# Patient Record
Sex: Female | Born: 1970 | Race: Black or African American | Hispanic: No | Marital: Single | Smoking: Never smoker
Health system: Southern US, Community
[De-identification: ages and names within clinical notes are randomized; demographics above are authoritative.]

## PROBLEM LIST (undated history)

## (undated) DIAGNOSIS — Z8669 Personal history of other diseases of the nervous system and sense organs: Secondary | ICD-10-CM

## (undated) DIAGNOSIS — I10 Essential (primary) hypertension: Secondary | ICD-10-CM

## (undated) DIAGNOSIS — E119 Type 2 diabetes mellitus without complications: Secondary | ICD-10-CM

## (undated) DIAGNOSIS — C50919 Malignant neoplasm of unspecified site of unspecified female breast: Secondary | ICD-10-CM

## (undated) DIAGNOSIS — J45909 Unspecified asthma, uncomplicated: Secondary | ICD-10-CM

## (undated) DIAGNOSIS — D649 Anemia, unspecified: Secondary | ICD-10-CM

## (undated) HISTORY — PX: BREAST SURGERY: SHX581

## (undated) HISTORY — DX: Malignant neoplasm of unspecified site of unspecified female breast: C50.919

## (undated) HISTORY — PX: BREAST LUMPECTOMY: SHX2

---

## 1997-11-06 ENCOUNTER — Inpatient Hospital Stay (HOSPITAL_COMMUNITY): Admission: AD | Admit: 1997-11-06 | Discharge: 1997-11-06 | Payer: Self-pay | Admitting: Obstetrics

## 1997-11-08 ENCOUNTER — Inpatient Hospital Stay (HOSPITAL_COMMUNITY): Admission: AD | Admit: 1997-11-08 | Discharge: 1997-11-08 | Payer: Self-pay | Admitting: Obstetrics & Gynecology

## 1997-11-08 ENCOUNTER — Encounter: Admission: RE | Admit: 1997-11-08 | Discharge: 1997-11-08 | Payer: Self-pay | Admitting: Internal Medicine

## 1998-09-14 ENCOUNTER — Emergency Department (HOSPITAL_COMMUNITY): Admission: EM | Admit: 1998-09-14 | Discharge: 1998-09-14 | Payer: Self-pay | Admitting: Emergency Medicine

## 1998-12-13 ENCOUNTER — Emergency Department (HOSPITAL_COMMUNITY): Admission: EM | Admit: 1998-12-13 | Discharge: 1998-12-13 | Payer: Self-pay | Admitting: Emergency Medicine

## 1998-12-13 ENCOUNTER — Encounter: Payer: Self-pay | Admitting: Emergency Medicine

## 1998-12-28 ENCOUNTER — Emergency Department (HOSPITAL_COMMUNITY): Admission: EM | Admit: 1998-12-28 | Discharge: 1998-12-28 | Payer: Self-pay | Admitting: Emergency Medicine

## 1999-01-10 ENCOUNTER — Encounter: Admission: RE | Admit: 1999-01-10 | Discharge: 1999-04-10 | Payer: Self-pay | Admitting: Orthopedic Surgery

## 1999-01-10 ENCOUNTER — Encounter: Admission: RE | Admit: 1999-01-10 | Discharge: 1999-01-10 | Payer: Self-pay | Admitting: Hematology and Oncology

## 1999-02-09 ENCOUNTER — Emergency Department (HOSPITAL_COMMUNITY): Admission: EM | Admit: 1999-02-09 | Discharge: 1999-02-09 | Payer: Self-pay | Admitting: Emergency Medicine

## 1999-05-24 ENCOUNTER — Emergency Department (HOSPITAL_COMMUNITY): Admission: EM | Admit: 1999-05-24 | Discharge: 1999-05-24 | Payer: Self-pay | Admitting: Emergency Medicine

## 1999-05-24 ENCOUNTER — Encounter: Payer: Self-pay | Admitting: Emergency Medicine

## 1999-09-05 ENCOUNTER — Encounter: Admission: RE | Admit: 1999-09-05 | Discharge: 1999-10-23 | Payer: Self-pay | Admitting: Family Medicine

## 1999-09-11 ENCOUNTER — Ambulatory Visit (HOSPITAL_COMMUNITY): Admission: RE | Admit: 1999-09-11 | Discharge: 1999-09-11 | Payer: Self-pay | Admitting: Family Medicine

## 1999-12-04 ENCOUNTER — Emergency Department (HOSPITAL_COMMUNITY): Admission: EM | Admit: 1999-12-04 | Discharge: 1999-12-04 | Payer: Self-pay | Admitting: Emergency Medicine

## 1999-12-04 ENCOUNTER — Encounter: Payer: Self-pay | Admitting: Emergency Medicine

## 2000-02-23 ENCOUNTER — Emergency Department (HOSPITAL_COMMUNITY): Admission: EM | Admit: 2000-02-23 | Discharge: 2000-02-23 | Payer: Self-pay | Admitting: Internal Medicine

## 2001-06-17 ENCOUNTER — Other Ambulatory Visit: Admission: RE | Admit: 2001-06-17 | Discharge: 2001-06-17 | Payer: Self-pay | Admitting: Obstetrics and Gynecology

## 2004-06-04 ENCOUNTER — Emergency Department (HOSPITAL_COMMUNITY): Admission: EM | Admit: 2004-06-04 | Discharge: 2004-06-04 | Payer: Self-pay | Admitting: Emergency Medicine

## 2006-09-29 ENCOUNTER — Emergency Department (HOSPITAL_COMMUNITY): Admission: EM | Admit: 2006-09-29 | Discharge: 2006-09-29 | Payer: Self-pay | Admitting: Emergency Medicine

## 2006-10-01 ENCOUNTER — Emergency Department (HOSPITAL_COMMUNITY): Admission: EM | Admit: 2006-10-01 | Discharge: 2006-10-01 | Payer: Self-pay | Admitting: *Deleted

## 2007-08-15 ENCOUNTER — Emergency Department (HOSPITAL_COMMUNITY): Admission: EM | Admit: 2007-08-15 | Discharge: 2007-08-16 | Payer: Self-pay | Admitting: Emergency Medicine

## 2008-05-24 ENCOUNTER — Inpatient Hospital Stay (HOSPITAL_COMMUNITY): Admission: AD | Admit: 2008-05-24 | Discharge: 2008-05-24 | Payer: Self-pay | Admitting: Obstetrics & Gynecology

## 2008-08-03 ENCOUNTER — Inpatient Hospital Stay (HOSPITAL_COMMUNITY): Admission: AD | Admit: 2008-08-03 | Discharge: 2008-08-03 | Payer: Self-pay | Admitting: Obstetrics

## 2009-01-04 ENCOUNTER — Inpatient Hospital Stay (HOSPITAL_COMMUNITY): Admission: AD | Admit: 2009-01-04 | Discharge: 2009-01-07 | Payer: Self-pay | Admitting: Obstetrics

## 2009-02-07 ENCOUNTER — Encounter: Admission: RE | Admit: 2009-02-07 | Discharge: 2009-02-07 | Payer: Self-pay | Admitting: Family Medicine

## 2009-02-26 ENCOUNTER — Emergency Department (HOSPITAL_COMMUNITY): Admission: EM | Admit: 2009-02-26 | Discharge: 2009-02-26 | Payer: Self-pay | Admitting: Emergency Medicine

## 2009-06-23 ENCOUNTER — Emergency Department (HOSPITAL_COMMUNITY): Admission: EM | Admit: 2009-06-23 | Discharge: 2009-06-24 | Payer: Self-pay | Admitting: Emergency Medicine

## 2009-11-20 ENCOUNTER — Emergency Department (HOSPITAL_COMMUNITY): Admission: EM | Admit: 2009-11-20 | Discharge: 2009-11-20 | Payer: Self-pay | Admitting: Emergency Medicine

## 2009-12-21 ENCOUNTER — Emergency Department (HOSPITAL_COMMUNITY): Admission: EM | Admit: 2009-12-21 | Discharge: 2009-12-21 | Payer: Self-pay | Admitting: Emergency Medicine

## 2010-02-10 ENCOUNTER — Emergency Department (HOSPITAL_COMMUNITY): Admission: EM | Admit: 2010-02-10 | Discharge: 2010-02-10 | Payer: Self-pay | Admitting: Emergency Medicine

## 2010-04-09 ENCOUNTER — Encounter: Payer: Self-pay | Admitting: Obstetrics

## 2010-04-09 ENCOUNTER — Encounter: Payer: Self-pay | Admitting: Chiropractic Medicine

## 2010-05-02 ENCOUNTER — Other Ambulatory Visit (HOSPITAL_COMMUNITY): Payer: Self-pay | Admitting: Obstetrics

## 2010-05-02 DIAGNOSIS — Z1231 Encounter for screening mammogram for malignant neoplasm of breast: Secondary | ICD-10-CM

## 2010-05-02 DIAGNOSIS — N920 Excessive and frequent menstruation with regular cycle: Secondary | ICD-10-CM

## 2010-05-09 ENCOUNTER — Encounter (HOSPITAL_COMMUNITY): Payer: Self-pay

## 2010-05-09 ENCOUNTER — Ambulatory Visit (HOSPITAL_COMMUNITY)
Admission: RE | Admit: 2010-05-09 | Discharge: 2010-05-09 | Disposition: A | Discharge status: Home / Self Care | Payer: BC Managed Care – PPO | Source: Ambulatory Visit | Attending: Obstetrics | Admitting: Obstetrics

## 2010-05-09 DIAGNOSIS — N938 Other specified abnormal uterine and vaginal bleeding: Secondary | ICD-10-CM | POA: Insufficient documentation

## 2010-05-09 DIAGNOSIS — N920 Excessive and frequent menstruation with regular cycle: Secondary | ICD-10-CM

## 2010-05-09 DIAGNOSIS — D259 Leiomyoma of uterus, unspecified: Secondary | ICD-10-CM | POA: Insufficient documentation

## 2010-05-09 DIAGNOSIS — N838 Other noninflammatory disorders of ovary, fallopian tube and broad ligament: Secondary | ICD-10-CM | POA: Insufficient documentation

## 2010-05-09 DIAGNOSIS — Z1231 Encounter for screening mammogram for malignant neoplasm of breast: Secondary | ICD-10-CM | POA: Insufficient documentation

## 2010-05-09 DIAGNOSIS — N949 Unspecified condition associated with female genital organs and menstrual cycle: Secondary | ICD-10-CM | POA: Insufficient documentation

## 2010-05-30 LAB — POCT URINALYSIS DIPSTICK
Ketones, ur: NEGATIVE mg/dL
Nitrite: NEGATIVE
Protein, ur: NEGATIVE mg/dL
Urobilinogen, UA: 0.2 mg/dL (ref 0.0–1.0)
pH: 6.5 (ref 5.0–8.0)

## 2010-05-30 LAB — POCT I-STAT, CHEM 8
BUN: 9 mg/dL (ref 6–23)
Calcium, Ion: 1.19 mmol/L (ref 1.12–1.32)
HCT: 37 % (ref 36.0–46.0)
Hemoglobin: 12.6 g/dL (ref 12.0–15.0)
Sodium: 137 mEq/L (ref 135–145)
TCO2: 30 mmol/L (ref 0–100)

## 2010-05-30 LAB — POCT PREGNANCY, URINE: Preg Test, Ur: NEGATIVE

## 2010-06-01 LAB — COMPREHENSIVE METABOLIC PANEL
CO2: 27 mEq/L (ref 19–32)
Calcium: 9.3 mg/dL (ref 8.4–10.5)
Creatinine, Ser: 0.69 mg/dL (ref 0.4–1.2)
GFR calc non Af Amer: >60 mL/min (ref >60)
Glucose, Bld: 92 mg/dL (ref 70–99)
Total Protein: 7.5 g/dL (ref 6.0–8.3)

## 2010-06-01 LAB — CBC
Hemoglobin: 9.7 g/dL — ABNORMAL LOW (ref 12.0–15.0)
Hemoglobin: 9.5 g/dL — ABNORMAL LOW (ref 12.0–15.0)
MCH: 21.2 pg — ABNORMAL LOW (ref 26.0–34.0)
MCHC: 32.5 g/dL (ref 30.0–36.0)
MCHC: 31.7 g/dL (ref 30.0–36.0)
MCV: 67.6 fL — ABNORMAL LOW (ref 78.0–100.0)
RBC: 4.43 MIL/uL (ref 3.87–5.11)
RDW: 21 % — ABNORMAL HIGH (ref 11.5–15.5)

## 2010-06-01 LAB — BASIC METABOLIC PANEL
BUN: 10 mg/dL (ref 6–23)
CO2: 28 mEq/L (ref 19–32)
Calcium: 10.2 mg/dL (ref 8.4–10.5)
Creatinine, Ser: 0.66 mg/dL (ref 0.4–1.2)
Glucose, Bld: 95 mg/dL (ref 70–99)
Sodium: 138 mEq/L (ref 135–145)

## 2010-06-01 LAB — CLOSTRIDIUM DIFFICILE EIA: C difficile Toxins A+B, EIA: 6

## 2010-06-01 LAB — HEMOCCULT GUIAC POC 1CARD (OFFICE): Fecal Occult Bld: NEGATIVE

## 2010-06-01 LAB — DIFFERENTIAL
Basophils Absolute: 0 10*3/uL (ref 0.0–0.1)
Eosinophils Absolute: 0.1 10*3/uL (ref 0.0–0.7)
Lymphocytes Relative: 31 % (ref 12–46)
Lymphs Abs: 1.3 10*3/uL (ref 0.7–4.0)
Monocytes Absolute: 0.2 10*3/uL (ref 0.1–1.0)
Neutro Abs: 2.3 10*3/uL (ref 1.7–7.7)
Neutrophils Relative %: 57 % (ref 43–77)
Neutrophils Relative %: 65 % (ref 43–77)

## 2010-06-01 LAB — URINE CULTURE: Colony Count: 10000

## 2010-06-01 LAB — URINALYSIS, ROUTINE W REFLEX MICROSCOPIC
Bilirubin Urine: NEGATIVE
Hgb urine dipstick: NEGATIVE
Ketones, ur: NEGATIVE mg/dL
Nitrite: NEGATIVE
Specific Gravity, Urine: 1.016 (ref 1.005–1.030)
Urobilinogen, UA: 0.2 mg/dL (ref 0.0–1.0)

## 2010-06-01 LAB — URINE MICROSCOPIC-ADD ON

## 2010-06-01 LAB — PREGNANCY, URINE: Preg Test, Ur: NEGATIVE

## 2010-06-01 LAB — LIPASE, BLOOD: Lipase: 26 U/L (ref 11–59)

## 2010-06-22 LAB — URIC ACID
Uric Acid, Serum: 6.3 mg/dL (ref 2.4–7.0)
Uric Acid, Serum: 9 mg/dL — ABNORMAL HIGH (ref 2.4–7.0)

## 2010-06-22 LAB — COMPREHENSIVE METABOLIC PANEL
ALT: 12 U/L (ref 0–35)
AST: 18 U/L (ref 0–37)
Albumin: 3 g/dL — ABNORMAL LOW (ref 3.5–5.2)
Alkaline Phosphatase: 71 U/L (ref 39–117)
BUN: 6 mg/dL (ref 6–23)
Calcium: 7.7 mg/dL — ABNORMAL LOW (ref 8.4–10.5)
Chloride: 103 mEq/L (ref 96–112)
Creatinine, Ser: 0.49 mg/dL (ref 0.4–1.2)
Creatinine, Ser: 0.81 mg/dL (ref 0.4–1.2)
GFR calc Af Amer: >60 mL/min (ref >60)
GFR calc non Af Amer: >60 mL/min (ref >60)
Glucose, Bld: 94 mg/dL (ref 70–99)
Potassium: 4.1 mEq/L (ref 3.5–5.1)
Sodium: 133 mEq/L — ABNORMAL LOW (ref 135–145)
Total Bilirubin: 0.4 mg/dL (ref 0.3–1.2)
Total Protein: 5 g/dL — ABNORMAL LOW (ref 6.0–8.3)

## 2010-06-22 LAB — CBC
HCT: 39.7 % (ref 36.0–46.0)
Hemoglobin: 13.2 g/dL (ref 12.0–15.0)
Hemoglobin: 8.6 g/dL — ABNORMAL LOW (ref 12.0–15.0)
MCHC: 33 g/dL (ref 30.0–36.0)
MCV: 84.2 fL (ref 78.0–100.0)
Platelets: 213 10*3/uL (ref 150–400)
RBC: 4.79 MIL/uL (ref 3.87–5.11)
RDW: 17 % — ABNORMAL HIGH (ref 11.5–15.5)
RDW: 17.2 % — ABNORMAL HIGH (ref 11.5–15.5)
WBC: 6.1 10*3/uL (ref 4.0–10.5)

## 2010-06-22 LAB — LACTATE DEHYDROGENASE: LDH: 145 U/L (ref 94–250)

## 2010-06-22 LAB — MAGNESIUM: Magnesium: 6.3 mg/dL (ref 1.5–2.5)

## 2010-06-27 LAB — GC/CHLAMYDIA PROBE AMP, GENITAL
Chlamydia, DNA Probe: NEGATIVE
GC Probe Amp, Genital: NEGATIVE

## 2010-06-29 LAB — GC/CHLAMYDIA PROBE AMP, GENITAL: Chlamydia, DNA Probe: NEGATIVE

## 2010-06-29 LAB — WET PREP, GENITAL
Clue Cells Wet Prep HPF POC: NONE SEEN
Trich, Wet Prep: NONE SEEN
Yeast Wet Prep HPF POC: NONE SEEN

## 2010-10-08 ENCOUNTER — Emergency Department (HOSPITAL_COMMUNITY)
Admission: EM | Admit: 2010-10-08 | Discharge: 2010-10-08 | Disposition: A | Discharge status: Home / Self Care | Payer: BC Managed Care – PPO | Attending: Emergency Medicine | Admitting: Emergency Medicine

## 2010-10-08 DIAGNOSIS — Z79899 Other long term (current) drug therapy: Secondary | ICD-10-CM | POA: Insufficient documentation

## 2010-10-08 DIAGNOSIS — R110 Nausea: Secondary | ICD-10-CM | POA: Insufficient documentation

## 2010-10-08 DIAGNOSIS — I10 Essential (primary) hypertension: Secondary | ICD-10-CM | POA: Insufficient documentation

## 2010-10-08 DIAGNOSIS — N390 Urinary tract infection, site not specified: Secondary | ICD-10-CM | POA: Insufficient documentation

## 2010-10-08 DIAGNOSIS — R002 Palpitations: Secondary | ICD-10-CM | POA: Insufficient documentation

## 2010-10-08 DIAGNOSIS — R42 Dizziness and giddiness: Secondary | ICD-10-CM | POA: Insufficient documentation

## 2010-10-08 LAB — DIFFERENTIAL
Basophils Absolute: 0 10*3/uL (ref 0.0–0.1)
Eosinophils Absolute: 0 10*3/uL (ref 0.0–0.7)
Metamyelocytes Relative: 0 %
Myelocytes: 0 %
Neutro Abs: 3.1 10*3/uL (ref 1.7–7.7)
Neutrophils Relative %: 72 % (ref 43–77)
Promyelocytes Absolute: 0 %
nRBC: 0 /100 WBC

## 2010-10-08 LAB — CBC
HCT: 28.9 % — ABNORMAL LOW (ref 36.0–46.0)
MCV: 65.2 fL — ABNORMAL LOW (ref 78.0–100.0)
Platelets: 378 10*3/uL (ref 150–400)
RBC: 4.43 MIL/uL (ref 3.87–5.11)
RDW: 17.9 % — ABNORMAL HIGH (ref 11.5–15.5)
WBC: 4.3 10*3/uL (ref 4.0–10.5)

## 2010-10-08 LAB — URINALYSIS, ROUTINE W REFLEX MICROSCOPIC
Bilirubin Urine: NEGATIVE
Nitrite: NEGATIVE
Specific Gravity, Urine: 1.021 (ref 1.005–1.030)
Urobilinogen, UA: 0.2 mg/dL (ref 0.0–1.0)
pH: 5.5 (ref 5.0–8.0)

## 2010-10-08 LAB — BASIC METABOLIC PANEL
CO2: 24 mEq/L (ref 19–32)
Chloride: 102 mEq/L (ref 96–112)
Creatinine, Ser: 0.65 mg/dL (ref 0.50–1.10)
GFR calc Af Amer: >60 mL/min (ref >60)
Potassium: 3.5 mEq/L (ref 3.5–5.1)

## 2010-10-08 LAB — URINE MICROSCOPIC-ADD ON

## 2010-10-08 LAB — POCT PREGNANCY, URINE: Preg Test, Ur: NEGATIVE

## 2010-10-10 LAB — URINE CULTURE

## 2011-01-15 ENCOUNTER — Emergency Department (HOSPITAL_COMMUNITY): Payer: BC Managed Care – PPO

## 2011-01-15 ENCOUNTER — Emergency Department (HOSPITAL_COMMUNITY)
Admission: EM | Admit: 2011-01-15 | Discharge: 2011-01-15 | Disposition: A | Discharge status: Home / Self Care | Payer: BC Managed Care – PPO | Attending: Emergency Medicine | Admitting: Emergency Medicine

## 2011-01-15 DIAGNOSIS — R000 Tachycardia, unspecified: Secondary | ICD-10-CM | POA: Insufficient documentation

## 2011-01-15 DIAGNOSIS — M5030 Other cervical disc degeneration, unspecified cervical region: Secondary | ICD-10-CM | POA: Insufficient documentation

## 2011-01-15 DIAGNOSIS — T1490XA Injury, unspecified, initial encounter: Secondary | ICD-10-CM | POA: Insufficient documentation

## 2011-01-15 DIAGNOSIS — Y92410 Unspecified street and highway as the place of occurrence of the external cause: Secondary | ICD-10-CM | POA: Insufficient documentation

## 2011-01-15 DIAGNOSIS — M549 Dorsalgia, unspecified: Secondary | ICD-10-CM | POA: Insufficient documentation

## 2011-02-11 ENCOUNTER — Emergency Department (HOSPITAL_COMMUNITY)
Admission: EM | Admit: 2011-02-11 | Discharge: 2011-02-11 | Disposition: A | Discharge status: Home / Self Care | Payer: BC Managed Care – PPO | Source: Home / Self Care | Attending: Emergency Medicine | Admitting: Emergency Medicine

## 2011-02-11 ENCOUNTER — Encounter (HOSPITAL_COMMUNITY): Payer: Self-pay | Admitting: *Deleted

## 2011-02-11 DIAGNOSIS — I10 Essential (primary) hypertension: Secondary | ICD-10-CM

## 2011-02-11 HISTORY — DX: Essential (primary) hypertension: I10

## 2011-02-11 NOTE — ED Provider Notes (Addendum)
History     CSN: [id number] Arrival date & time: 02/11/2011 10:03 AM   First MD Initiated Contact with Patient 02/11/11 [phone]      Chief Complaint  Patient presents with  . Hypertension    pt monitors bp at home this am 163/108 - took bp med 8am - now bp 154/92 - per pt felt nauseated this am - feeling better     (Consider location/radiation/quality/duration/timing/severity/associated sxs/prior treatment) HPI Comments: I know my BP has been high these days..cause having headaches no numbness", no weakness  Patient is a 40 y.o. female presenting with hypertension. The history is provided by the patient.  Hypertension This is a new problem. The problem occurs daily. The problem has not changed since onset.Associated symptoms include headaches. Pertinent negatives include no chest pain, no abdominal pain and no shortness of breath. The symptoms are aggravated by nothing. The symptoms are relieved by nothing. She has tried nothing for the symptoms.    Past Medical History  Diagnosis Date  . Hypertension   . Iron deficiency     Past Surgical History  Procedure Date  . Cesarean section     History reviewed. No pertinent family history.  History  Substance Use Topics  . Smoking status: Not on file  . Smokeless tobacco: Not on file  . Alcohol Use:     OB History    Grav Para Term Preterm Abortions TAB SAB Ect Mult Living                  Review of Systems  Constitutional: Negative for fever and activity change.  Respiratory: Negative for shortness of breath.   Cardiovascular: Negative for chest pain.  Gastrointestinal: Negative for abdominal pain.  Neurological: Positive for headaches. Negative for speech difficulty, weakness, light-headedness and numbness.    Allergies  Shrimp  Home Medications   Current Outpatient Rx  Name Route Sig Dispense Refill  . FERROUS SULFATE 325 (65 FE) MG PO TABS Oral Take by mouth daily with breakfast.      .  OLMESARTAN-AMLODIPINE-HCTZ 20-5-12.5 MG PO TABS Oral Take by mouth.        BP 154/92  Pulse 81  Temp(Src) 98.7 F (37.1 C) (Oral)  Resp 17  SpO2 100%  LMP 02/07/2011  Physical Exam  Nursing note and vitals reviewed. Constitutional: She is oriented to person, place, and time. She appears well-developed and well-nourished.  Eyes: Pupils are equal, round, and reactive to light.  Neck: Normal range of motion. Neck supple. No JVD present.  Cardiovascular: Normal rate, regular rhythm and normal heart sounds.   No murmur heard. Pulmonary/Chest: Effort normal and breath sounds normal. No respiratory distress.  Neurological: She is alert and oriented to person, place, and time. No cranial nerve deficit. Coordination abnormal.    ED Course  Procedures (including critical care time)  Labs Reviewed - No data to display No results found.   No diagnosis found.    MDM  HTN- asymptomatic on discharge        Jimmie Molly, MD 02/11/11 1044  Jimmie Molly, MD 02/11/11 1045

## 2011-02-27 ENCOUNTER — Emergency Department (HOSPITAL_COMMUNITY)
Admission: EM | Admit: 2011-02-27 | Discharge: 2011-02-27 | Disposition: A | Discharge status: Home / Self Care | Payer: BC Managed Care – PPO | Attending: Emergency Medicine | Admitting: Emergency Medicine

## 2011-02-27 ENCOUNTER — Encounter (HOSPITAL_COMMUNITY): Payer: Self-pay | Admitting: *Deleted

## 2011-02-27 DIAGNOSIS — R509 Fever, unspecified: Secondary | ICD-10-CM | POA: Insufficient documentation

## 2011-02-27 DIAGNOSIS — R059 Cough, unspecified: Secondary | ICD-10-CM | POA: Insufficient documentation

## 2011-02-27 DIAGNOSIS — R05 Cough: Secondary | ICD-10-CM | POA: Insufficient documentation

## 2011-02-27 DIAGNOSIS — J40 Bronchitis, not specified as acute or chronic: Secondary | ICD-10-CM | POA: Insufficient documentation

## 2011-02-27 DIAGNOSIS — I10 Essential (primary) hypertension: Secondary | ICD-10-CM | POA: Insufficient documentation

## 2011-02-27 DIAGNOSIS — IMO0001 Reserved for inherently not codable concepts without codable children: Secondary | ICD-10-CM | POA: Insufficient documentation

## 2011-02-27 MED ORDER — AZITHROMYCIN 250 MG PO TABS: 500.0000 mg | ORAL_TABLET | Freq: Once | ORAL | Status: AC

## 2011-02-27 MED ORDER — HYDROCOD POLST-CHLORPHEN POLST 10-8 MG/5ML PO LQCR: 5.0000 mL | Freq: Two times a day (BID) | ORAL | Status: DC

## 2011-02-27 NOTE — ED Provider Notes (Signed)
History     CSN: [id number] Arrival date & time: 02/27/2011 10:45 PM   First MD Initiated Contact with Patient 02/27/11 2255      Chief Complaint  Patient presents with  . Influenza    (Consider location/radiation/quality/duration/timing/severity/associated sxs/prior treatment) HPI Comments: Patient here with cough with green sputum production, fever, chills, shortness of breath and sore throat - reports works in the [employer] system - states had flu shot this year - no other symptoms.  Patient is a 40 y.o. female presenting with flu symptoms. The history is provided by the patient. No language interpreter was used.  Influenza This is a new problem. The current episode started yesterday. The problem occurs constantly. The problem has been unchanged. Associated symptoms include congestion, coughing, a fever, myalgias and a sore throat. Pertinent negatives include no abdominal pain, anorexia, arthralgias, chest pain, chills, headaches, joint swelling, nausea, neck pain, rash, swollen glands, visual change, vomiting or weakness. The symptoms are aggravated by nothing. She has tried nothing for the symptoms. The treatment provided no relief.    Past Medical History  Diagnosis Date  . Hypertension   . Iron deficiency   . Bronchiectasis     Past Surgical History  Procedure Date  . Cesarean section     History reviewed. No pertinent family history.  History  Substance Use Topics  . Smoking status: Never Smoker   . Smokeless tobacco: Not on file  . Alcohol Use: Yes     occa    OB History    Grav Para Term Preterm Abortions TAB SAB Ect Mult Living                  Review of Systems  Constitutional: Positive for fever. Negative for chills.  HENT: Positive for congestion and sore throat. Negative for neck pain.   Respiratory: Positive for cough.   Cardiovascular: Negative for chest pain.  Gastrointestinal: Negative for nausea, vomiting, abdominal pain and  anorexia.  Musculoskeletal: Positive for myalgias. Negative for joint swelling and arthralgias.  Skin: Negative for rash.  Neurological: Negative for weakness and headaches.  All other systems reviewed and are negative.    Allergies  Shrimp  Home Medications   Current Outpatient Rx  Name Route Sig Dispense Refill  . FERROUS SULFATE 325 (65 FE) MG PO TABS Oral Take by mouth daily with breakfast.      . OLMESARTAN-AMLODIPINE-HCTZ 20-5-12.5 MG PO TABS Oral Take by mouth.        BP 162/97  Pulse 104  Temp(Src) 97.8 F (36.6 C) (Oral)  Resp 20  SpO2 100%  LMP 02/07/2011  Physical Exam  Nursing note and vitals reviewed. Constitutional: She is oriented to person, place, and time. She appears well-developed and well-nourished. No distress.  HENT:  Head: Normocephalic and atraumatic.  Right Ear: External ear normal.  Left Ear: External ear normal.  Mouth/Throat: Oropharynx is clear and moist. No oropharyngeal exudate.  Eyes: Conjunctivae are normal. Pupils are equal, round, and reactive to light.  Neck: Normal range of motion. Neck supple.  Cardiovascular: Normal rate, regular rhythm and normal heart sounds.   Pulmonary/Chest: Effort normal and breath sounds normal. She has no wheezes. She has no rales. She exhibits no tenderness.  Abdominal: Soft. Bowel sounds are normal. There is no tenderness.  Musculoskeletal: Normal range of motion.  Lymphadenopathy:    She has no cervical adenopathy.  Neurological: She is alert and oriented to person, place, and time.  Skin: Skin is warm  and dry.  Psychiatric: She has a normal mood and affect. Her behavior is normal. Judgment and thought content normal.    ED Course  Procedures (including critical care time)  Labs Reviewed - No data to display No results found.   Bronchitis    MDM  Patient here with bronchitic type symptoms more so that influenza like illness.  Afebrile here - she is noted to be hypertensive - she states that  she doesn't take her blood pressure medication like she should.        Izola Price [place], [place] 02/27/11 2324

## 2011-02-27 NOTE — ED Notes (Signed)
Pt states she started to fell fatigue, chills, and body aches on sat. Pt works at [employer]. 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

## 2011-02-28 NOTE — ED Provider Notes (Signed)
Medical screening examination/treatment/procedure(s) were performed by non-physician practitioner and as supervising physician I was immediately available for consultation/collaboration.  Noell Lorensen, MD 02/28/11 1558 

## 2011-03-22 ENCOUNTER — Inpatient Hospital Stay (HOSPITAL_COMMUNITY): Payer: BC Managed Care – PPO

## 2011-03-22 ENCOUNTER — Encounter (HOSPITAL_COMMUNITY): Payer: Self-pay | Admitting: *Deleted

## 2011-03-22 ENCOUNTER — Inpatient Hospital Stay (HOSPITAL_COMMUNITY)
Admission: AD | Admit: 2011-03-22 | Discharge: 2011-03-22 | Disposition: A | Discharge status: Home / Self Care | Payer: BC Managed Care – PPO | Source: Ambulatory Visit | Attending: Obstetrics and Gynecology | Admitting: Obstetrics and Gynecology

## 2011-03-22 DIAGNOSIS — R1031 Right lower quadrant pain: Secondary | ICD-10-CM | POA: Insufficient documentation

## 2011-03-22 DIAGNOSIS — A5901 Trichomonal vulvovaginitis: Secondary | ICD-10-CM | POA: Insufficient documentation

## 2011-03-22 DIAGNOSIS — D259 Leiomyoma of uterus, unspecified: Secondary | ICD-10-CM | POA: Insufficient documentation

## 2011-03-22 DIAGNOSIS — N83209 Unspecified ovarian cyst, unspecified side: Secondary | ICD-10-CM | POA: Insufficient documentation

## 2011-03-22 DIAGNOSIS — A599 Trichomoniasis, unspecified: Secondary | ICD-10-CM

## 2011-03-22 LAB — WET PREP, GENITAL: Clue Cells Wet Prep HPF POC: NONE SEEN

## 2011-03-22 LAB — CBC
HCT: 31.7 % — ABNORMAL LOW (ref 36.0–46.0)
MCV: 72.2 fL — ABNORMAL LOW (ref 78.0–100.0)
RBC: 4.39 MIL/uL (ref 3.87–5.11)
WBC: 6.1 10*3/uL (ref 4.0–10.5)

## 2011-03-22 LAB — URINALYSIS, ROUTINE W REFLEX MICROSCOPIC
Ketones, ur: 15 mg/dL — AB
Nitrite: NEGATIVE
Protein, ur: NEGATIVE mg/dL
pH: 6.5 (ref 5.0–8.0)

## 2011-03-22 MED ORDER — METRONIDAZOLE 500 MG PO TABS
2000.0000 mg | ORAL_TABLET | Freq: Once | ORAL | Status: AC
  Administered 2011-03-22: 2000 mg via ORAL
  Filled 2011-03-22: qty 4

## 2011-03-22 MED ORDER — KETOROLAC TROMETHAMINE 60 MG/2ML IM SOLN
60.0000 mg | Freq: Once | INTRAMUSCULAR | Status: AC
  Administered 2011-03-22: 60 mg via INTRAMUSCULAR
  Filled 2011-03-22: qty 2

## 2011-03-22 NOTE — ED Notes (Signed)
abd soft, tender on palp in RLQ.

## 2011-03-22 NOTE — ED Notes (Signed)
Pt did have alcohol last night, was given crackers and fluids

## 2011-03-22 NOTE — ED Provider Notes (Signed)
History     Chief Complaint  Patient presents with  . Abdominal Pain   HPI  Pt is not pregnant and presents with RLQ since 12/30.  Pt denies vaginal discharge, itching, burning .  She denies fever, chills, constipation or diarrhea.  Pt has a history of fibroids.  Pt has not taken any pain medication.  Past Medical History  Diagnosis Date  . Hypertension   . Iron deficiency   . Bronchiectasis   . Urinary tract infection   . Fibroid   . Chlamydia     Past Surgical History  Procedure Date  . Cesarean section     Family History  Problem Relation Age of Onset  . Anesthesia problems Neg Hx     History  Substance Use Topics  . Smoking status: Former Games developer  . Smokeless tobacco: Never Used  . Alcohol Use: Yes     occa    Allergies:  Allergies  Allergen Reactions  . Shrimp (Shellfish Allergy)     Prescriptions prior to admission  Medication Sig Dispense Refill  . chlorpheniramine-HYDROcodone (TUSSIONEX PENNKINETIC ER) 10-8 MG/5ML LQCR Take 5 mLs by mouth every 12 (twelve) hours.  140 mL  0  . ferrous sulfate 325 (65 FE) MG tablet Take by mouth daily with breakfast.        . Olmesartan-Amlodipine-HCTZ 20-5-12.5 MG TABS Take by mouth.          ROS Physical Exam   Blood pressure 139/94, pulse 94, temperature 99 F (37.2 C), temperature source Oral, resp. rate 16, height 5\' 3"  (1.6 m), weight 204 lb (92.534 kg), last menstrual period 03/08/2011, SpO2 99.00%.  Physical Exam  Vitals reviewed. Constitutional: She is oriented to person, place, and time. She appears well-developed and well-nourished.  HENT:  Head: Normocephalic.  Eyes: Pupils are equal, round, and reactive to light.  Neck: Normal range of motion. Neck supple.  Cardiovascular: Normal rate.   Respiratory: Effort normal.  GI: Soft. She exhibits no distension and no mass. There is tenderness. There is no rebound and no guarding.  Genitourinary:       Mod amount of creamy yellow discharge; cervix clean  nontender; uterus retroverted making full assessment difficult; right tender adnexal difficult to assess whether ovary or right uterine fibroid; left adnexal without palpable enlargement or tenderness  Musculoskeletal: Normal range of motion.  Neurological: She is alert and oriented to person, place, and time.  Skin: Skin is warm and dry.  Psychiatric: She has a normal mood and affect.    MAU Course  Procedures Results for orders placed during the hospital encounter of 03/22/11 (from the past 24 hour(s))  URINALYSIS, ROUTINE W REFLEX MICROSCOPIC     Status: Abnormal   Collection Time   03/22/11  1:29 PM      Component Value Range   Color, Urine YELLOW  YELLOW    APPearance HAZY (*) CLEAR    Specific Gravity, Urine 1.020  1.005 - 1.030    pH 6.5  5.0 - 8.0    Glucose, UA NEGATIVE  NEGATIVE (mg/dL)   Hgb urine dipstick NEGATIVE  NEGATIVE    Bilirubin Urine NEGATIVE  NEGATIVE    Ketones, ur 15 (*) NEGATIVE (mg/dL)   Protein, ur NEGATIVE  NEGATIVE (mg/dL)   Urobilinogen, UA 0.2  0.0 - 1.0 (mg/dL)   Nitrite NEGATIVE  NEGATIVE    Leukocytes, UA MODERATE (*) NEGATIVE   URINE MICROSCOPIC-ADD ON     Status: Abnormal   Collection Time  03/22/11  1:29 PM      Component Value Range   Squamous Epithelial / LPF FEW (*) RARE    WBC, UA 11-20  <3 (WBC/hpf)   RBC / HPF 7-10  <3 (RBC/hpf)   Bacteria, UA MANY (*) RARE    Urine-Other TRICHOMONAS PRESENT    POCT PREGNANCY, URINE     Status: Normal   Collection Time   03/22/11  1:33 PM      Component Value Range   Preg Test, Ur NEGATIVE    CBC     Status: Abnormal   Collection Time   03/22/11  1:59 PM      Component Value Range   WBC 6.1  4.0 - 10.5 (K/uL)   RBC 4.39  3.87 - 5.11 (MIL/uL)   Hemoglobin 9.7 (*) 12.0 - 15.0 (g/dL)   HCT 46.9 (*) 62.9 - 46.0 (%)   MCV 72.2 (*) 78.0 - 100.0 (fL)   MCH 22.1 (*) 26.0 - 34.0 (pg)   MCHC 30.6  30.0 - 36.0 (g/dL)   RDW 52.8 (*) 41.3 - 15.5 (%)   Platelets 368  150 - 400 (K/uL)   fibroids. LMP  03/08/2011  TRANSABDOMINAL AND TRANSVAGINAL ULTRASOUND OF PELVIS  Technique: Both transabdominal and transvaginal ultrasound  examinations of the pelvis were performed. Transabdominal technique  was performed for global imaging of the pelvis including uterus,  ovaries, adnexal regions, and pelvic cul-de-sac.  Comparison: 05/09/2010  It was necessary to proceed with endovaginal exam following the  transabdominal exam to visualize the myometrium endometrium and  adnexa.  Findings:  Uterus: Is enlarged with a sagittal length of 9.6 cm, depth of 7.8  cm and width of 7.9 cm. Multiple fibroids are identified and have  sizes and locations as follows: With a subserosal component in the  right fundal region measuring 3.3 x 3.6 x 3.0 cm, in the right  lateral mid body measuring 1.6 x 2.0 x 1.3 cm, in the left  posterolateral lower uterine segment measuring 3.6 x 3.0 by 3.8 cm  and in the left lateral mid body measuring 2.7 x 2.3 x 2.6 cm with  a questionable small submucosal component. A small fibroid is seen  emanating off of the left lateral lower uterine segment with a  subserosal component measuring 1.2 x 2.1 x 1.23 cm. Multiple  Nabothian cysts are identified within the cervix.  Endometrium: Has a late tri- layered appearance which would  correlate with a periovulatory endometrial stripe and correspond  with the patient's given LMP of 03/08/2011. Depth in the sagittal  plane of 17 mm is likely spuriously increased due to deviation from  the left lateral partial submucosal fibroid. No focal abnormality.  Right ovary: Measures 4.8 x 3.0 by 2.5 cm a hypoechoic area with  peripheral flow measuring 2.7 x 3.1 cm and likely representing a  hemorrhagic corpus luteum.  Left ovary: Measures 2.7 by 3.4 x 2.1 cm and has a normal  appearance  Other findings: A small amount of complex fluid is identified in  the cul-de-sac and adjacent to the right ovary.  IMPRESSION:  Fibroid uterus with fibroid  sizes and locations as noted above.  Several of the fibroids have a partial subserosal component and one  has a partial submucosal component.  Accurate endometrial measurement is compromised by the presence of  the submucosal fibroid. No definite endometrial abnormality is  seen.  Probable right hemorrhagic corpus luteum with possible leakage as  evidenced by small amount of associated complex  pelvic fluid.  Original Report Authenticated By: Bertha Stakes, M.D.   Urinalysis showed Trichomonas  Wet prep GC/chlamydia-pending                       Assessment and Plan  firboid uterus Right hemorrhagic ovarian cyst Pt obtained relief of pain with Toradol 60 mg IM Trichomonas-treated in MAU with Flagyl 2 gm Partner to be treated F/u with Dr. Ellin Goodie 03/22/2011, 3:44 PM

## 2011-03-22 NOTE — Progress Notes (Signed)
Patient states she had been having lower abdominal pain that radiates to the right flank since 12-30. Denies any bleeding or vaginal discharge. States has had a little nausea but no vomiting.

## 2011-03-22 NOTE — Progress Notes (Signed)
Hx of fibroids, don't know if pain is from that or pulled something at work.

## 2011-03-23 LAB — GC/CHLAMYDIA PROBE AMP, GENITAL
Chlamydia, DNA Probe: NEGATIVE
GC Probe Amp, Genital: NEGATIVE

## 2011-05-21 ENCOUNTER — Other Ambulatory Visit (HOSPITAL_COMMUNITY): Payer: Self-pay | Admitting: Obstetrics

## 2011-05-21 DIAGNOSIS — Z1231 Encounter for screening mammogram for malignant neoplasm of breast: Secondary | ICD-10-CM

## 2011-06-15 ENCOUNTER — Ambulatory Visit (HOSPITAL_COMMUNITY): Payer: BC Managed Care – PPO | Attending: Obstetrics

## 2011-06-29 ENCOUNTER — Ambulatory Visit (HOSPITAL_COMMUNITY)
Admission: RE | Admit: 2011-06-29 | Discharge: 2011-06-29 | Disposition: A | Discharge status: Home / Self Care | Payer: BC Managed Care – PPO | Source: Ambulatory Visit | Attending: Obstetrics | Admitting: Obstetrics

## 2011-06-29 DIAGNOSIS — Z1231 Encounter for screening mammogram for malignant neoplasm of breast: Secondary | ICD-10-CM

## 2011-09-09 ENCOUNTER — Emergency Department (HOSPITAL_COMMUNITY)
Admission: EM | Admit: 2011-09-09 | Discharge: 2011-09-09 | Disposition: A | Discharge status: Home / Self Care | Payer: BC Managed Care – PPO | Attending: Emergency Medicine | Admitting: Emergency Medicine

## 2011-09-09 ENCOUNTER — Encounter (HOSPITAL_COMMUNITY): Payer: Self-pay

## 2011-09-09 DIAGNOSIS — I10 Essential (primary) hypertension: Secondary | ICD-10-CM | POA: Insufficient documentation

## 2011-09-09 DIAGNOSIS — Z87891 Personal history of nicotine dependence: Secondary | ICD-10-CM | POA: Insufficient documentation

## 2011-09-09 DIAGNOSIS — D509 Iron deficiency anemia, unspecified: Secondary | ICD-10-CM | POA: Insufficient documentation

## 2011-09-09 MED ORDER — OLMESARTAN-AMLODIPINE-HCTZ 20-5-12.5 MG PO TABS: 1.0000 | ORAL_TABLET | ORAL | Status: DC

## 2011-09-09 NOTE — Discharge Instructions (Signed)
Arterial Hypertension Arterial hypertension (high blood pressure) is a condition of elevated pressure in your blood vessels. Hypertension over a long period of time is a risk factor for strokes, heart attacks, and heart failure. It is also the leading cause of kidney (renal) failure.  CAUSES   In Adults -- Over 90% of all hypertension has no known cause. This is called essential or primary hypertension. In the other 10% of people with hypertension, the increase in blood pressure is caused by another disorder. This is called secondary hypertension. Important causes of secondary hypertension are:   Heavy alcohol use.   Obstructive sleep apnea.   Hyperaldosterosim (Conn's syndrome).   Steroid use.   Chronic kidney failure.   Hyperparathyroidism.   Medications.   Renal artery stenosis.   Pheochromocytoma.   Cushing's disease.   Coarctation of the aorta.   Scleroderma renal crisis.   Licorice (in excessive amounts).   Drugs (cocaine, methamphetamine).  Your caregiver can explain any items above that apply to you.  In Children -- Secondary hypertension is more common and should always be considered.   Pregnancy -- Few women of childbearing age have high blood pressure. However, up to 10% of them develop hypertension of pregnancy. Generally, this will not harm the woman. It may be a sign of 3 complications of pregnancy: preeclampsia, HELLP syndrome, and eclampsia. Follow up and control with medication is necessary.  SYMPTOMS   This condition normally does not produce any noticeable symptoms. It is usually found during a routine exam.   Malignant hypertension is a late problem of high blood pressure. It may have the following symptoms:   Headaches.   Blurred vision.   End-organ damage (this means your kidneys, heart, lungs, and other organs are being damaged).   Stressful situations can increase the blood pressure. If a person with normal blood pressure has their blood  pressure go up while being seen by their caregiver, this is often termed "white coat hypertension." Its importance is not known. It may be related with eventually developing hypertension or complications of hypertension.   Hypertension is often confused with mental tension, stress, and anxiety.  DIAGNOSIS  The diagnosis is made by 3 separate blood pressure measurements. They are taken at least 1 week apart from each other. If there is organ damage from hypertension, the diagnosis may be made without repeat measurements. Hypertension is usually identified by having blood pressure readings:  Above 140/90 mmHg measured in both arms, at 3 separate times, over a couple weeks.   Over 130/80 mmHg should be considered a risk factor and may require treatment in patients with diabetes.  Blood pressure readings over 120/80 mmHg are called "pre-hypertension" even in non-diabetic patients. To get a true blood pressure measurement, use the following guidelines. Be aware of the factors that can alter blood pressure readings.  Take measurements at least 1 hour after caffeine.   Take measurements 30 minutes after smoking and without any stress. This is another reason to quit smoking - it raises your blood pressure.   Use a proper cuff size. Ask your caregiver if you are not sure about your cuff size.   Most home blood pressure cuffs are automatic. They will measure systolic and diastolic pressures. The systolic pressure is the pressure reading at the start of sounds. Diastolic pressure is the pressure at which the sounds disappear. If you are elderly, measure pressures in multiple postures. Try sitting, lying or standing.   Sit at rest for a minimum of   5 minutes before taking measurements.   You should not be on any medications like decongestants. These are found in many cold medications.   Record your blood pressure readings and review them with your caregiver.  If you have hypertension:  Your caregiver  may do tests to be sure you do not have secondary hypertension (see "causes" above).   Your caregiver may also look for signs of metabolic syndrome. This is also called Syndrome X or Insulin Resistance Syndrome. You may have this syndrome if you have type 2 diabetes, abdominal obesity, and abnormal blood lipids in addition to hypertension.   Your caregiver will take your medical and family history and perform a physical exam.   Diagnostic tests may include blood tests (for glucose, cholesterol, potassium, and kidney function), a urinalysis, or an EKG. Other tests may also be necessary depending on your condition.  PREVENTION  There are important lifestyle issues that you can adopt to reduce your chance of developing hypertension:  Maintain a normal weight.   Limit the amount of salt (sodium) in your diet.   Exercise often.   Limit alcohol intake.   Get enough potassium in your diet. Discuss specific advice with your caregiver.   Follow a DASH diet (dietary approaches to stop hypertension). This diet is rich in fruits, vegetables, and low-fat dairy products, and avoids certain fats.  PROGNOSIS  Essential hypertension cannot be cured. Lifestyle changes and medical treatment can lower blood pressure and reduce complications. The prognosis of secondary hypertension depends on the underlying cause. Many people whose hypertension is controlled with medicine or lifestyle changes can live a normal, healthy life.  RISKS AND COMPLICATIONS  While high blood pressure alone is not an illness, it often requires treatment due to its short- and long-term effects on many organs. Hypertension increases your risk for:  CVAs or strokes (cerebrovascular accident).   Heart failure due to chronically high blood pressure (hypertensive cardiomyopathy).   Heart attack (myocardial infarction).   Damage to the retina (hypertensive retinopathy).   Kidney failure (hypertensive nephropathy).  Your caregiver can  explain list items above that apply to you. Treatment of hypertension can significantly reduce the risk of complications. TREATMENT   For overweight patients, weight loss and regular exercise are recommended. Physical fitness lowers blood pressure.   Mild hypertension is usually treated with diet and exercise. A diet rich in fruits and vegetables, fat-free dairy products, and foods low in fat and salt (sodium) can help lower blood pressure. Decreasing salt intake decreases blood pressure in a 1/3 of people.   Stop smoking if you are a smoker.  The steps above are highly effective in reducing blood pressure. While these actions are easy to suggest, they are difficult to achieve. Most patients with moderate or severe hypertension end up requiring medications to bring their blood pressure down to a normal level. There are several classes of medications for treatment. Blood pressure pills (antihypertensives) will lower blood pressure by their different actions. Lowering the blood pressure by 10 mmHg may decrease the risk of complications by as much as 25%. The goal of treatment is effective blood pressure control. This will reduce your risk for complications. Your caregiver will help you determine the best treatment for you according to your lifestyle. What is excellent treatment for one person, may not be for you. HOME CARE INSTRUCTIONS   Do not smoke.   Follow the lifestyle changes outlined in the "Prevention" section.   If you are on medications, follow the directions   carefully. Blood pressure medications must be taken as prescribed. Skipping doses reduces their benefit. It also puts you at risk for problems.   Follow up with your caregiver, as directed.   If you are asked to monitor your blood pressure at home, follow the guidelines in the "Diagnosis" section above.  SEEK MEDICAL CARE IF:   You think you are having medication side effects.   You have recurrent headaches or lightheadedness.     You have swelling in your ankles.   You have trouble with your vision.  SEEK IMMEDIATE MEDICAL CARE IF:   You have sudden onset of chest pain or pressure, difficulty breathing, or other symptoms of a heart attack.   You have a severe headache.   You have symptoms of a stroke (such as sudden weakness, difficulty speaking, difficulty walking).  MAKE SURE YOU:   Understand these instructions.   Will watch your condition.   Will get help right away if you are not doing well or get worse.  Document Released: 03/05/2005 Document Revised: 02/22/2011 Document Reviewed: 10/03/2006 [employer] Patient Information 2012 [employer]. 

## 2011-09-09 NOTE — ED Provider Notes (Signed)
History     CSN: [id number]  Arrival date & time 09/09/11  1024   First MD Initiated Contact with Patient 09/09/11 1034      Chief Complaint  Patient presents with  . Hypertension     HPI Pt reports BP is up, feeling dizzy, gets meds from [facility], last took meds on Friday, tribenzor  Past Medical History  Diagnosis Date  . Hypertension   . Iron deficiency   . Bronchiectasis   . Urinary tract infection   . Fibroid   . Chlamydia     Past Surgical History  Procedure Date  . Cesarean section     Family History  Problem Relation Age of Onset  . Anesthesia problems Neg Hx     History  Substance Use Topics  . Smoking status: Former Games developer  . Smokeless tobacco: Never Used  . Alcohol Use: Yes     occa    OB History    Grav Para Term Preterm Abortions TAB SAB Ect Mult Living   1 1 1  0 0 0 0 0 0 1      Review of Systems  All other systems reviewed and are negative.    Allergies  Shrimp  Home Medications   Current Outpatient Rx  Name Route Sig Dispense Refill  . OLMESARTAN-AMLODIPINE-HCTZ 20-5-12.5 MG PO TABS Oral Take 1 tablet by mouth daily.    Marland Kitchen OLMESARTAN-AMLODIPINE-HCTZ 20-5-12.5 MG PO TABS Oral Take 1 tablet by mouth 1 day or 1 dose. 30 tablet 0    BP 136/81  Pulse 84  Temp 99 F (37.2 C) (Oral)  Resp 18  SpO2 100%  LMP 08/29/2011  Physical Exam  Nursing note and vitals reviewed. Constitutional: She is oriented to person, place, and time. She appears well-developed and well-nourished. No distress.  HENT:  Head: Normocephalic and atraumatic.  Eyes: Pupils are equal, round, and reactive to light.  Neck: Normal range of motion.  Cardiovascular: Normal rate and intact distal pulses.   Pulmonary/Chest: No respiratory distress.  Abdominal: Normal appearance. She exhibits no distension.  Musculoskeletal: Normal range of motion.  Neurological: She is alert and oriented to person, place, and time. She has normal strength. No cranial nerve  deficit or sensory deficit. She displays a negative Romberg sign. GCS eye subscore is 4. GCS verbal subscore is 5. GCS motor subscore is 6.  Reflex Scores:      Bicep reflexes are 2+ on the right side and 2+ on the left side.      Patellar reflexes are 2+ on the right side and 2+ on the left side.      Achilles reflexes are 2+ on the right side and 2+ on the left side. Skin: Skin is warm and dry. No rash noted.  Psychiatric: She has a normal mood and affect. Her behavior is normal.    ED Course  Procedures (including critical care time)  Labs Reviewed - No data to display No results found.   1. Hypertension       MDM          Nelia Shi, MD 09/10/11 1555

## 2011-09-09 NOTE — ED Notes (Signed)
MD at bedside. Called pharmacy to clarify a medication that pt. Takes currently; will have med. Rec. Person follow-up.

## 2011-09-09 NOTE — ED Notes (Signed)
Pt reports BP is up, feeling dizzy, gets meds from [facility], last took meds on Friday, tribenzor

## 2011-11-20 ENCOUNTER — Encounter (HOSPITAL_COMMUNITY): Payer: Self-pay | Admitting: *Deleted

## 2011-11-20 ENCOUNTER — Inpatient Hospital Stay (HOSPITAL_COMMUNITY)
Admission: AD | Admit: 2011-11-20 | Discharge: 2011-11-20 | Disposition: A | Discharge status: Home / Self Care | Payer: BC Managed Care – PPO | Source: Ambulatory Visit | Attending: Obstetrics | Admitting: Obstetrics

## 2011-11-20 DIAGNOSIS — N9489 Other specified conditions associated with female genital organs and menstrual cycle: Secondary | ICD-10-CM | POA: Insufficient documentation

## 2011-11-20 DIAGNOSIS — N888 Other specified noninflammatory disorders of cervix uteri: Secondary | ICD-10-CM | POA: Diagnosis present

## 2011-11-20 DIAGNOSIS — N949 Unspecified condition associated with female genital organs and menstrual cycle: Secondary | ICD-10-CM | POA: Insufficient documentation

## 2011-11-20 DIAGNOSIS — D259 Leiomyoma of uterus, unspecified: Secondary | ICD-10-CM | POA: Diagnosis present

## 2011-11-20 LAB — URINALYSIS, ROUTINE W REFLEX MICROSCOPIC
Ketones, ur: NEGATIVE mg/dL
Leukocytes, UA: NEGATIVE
Nitrite: NEGATIVE
Protein, ur: NEGATIVE mg/dL

## 2011-11-20 MED ORDER — KETOROLAC TROMETHAMINE 60 MG/2ML IM SOLN
60.0000 mg | INTRAMUSCULAR | Status: AC
  Administered 2011-11-20: 60 mg via INTRAMUSCULAR
  Filled 2011-11-20: qty 2

## 2011-11-20 NOTE — MAU Provider Note (Signed)
Chief Complaint: Object hanging from vagina    First Provider Initiated Contact with Patient 11/20/11 1838     SUBJECTIVE HPI: Sheryl Cox is a 41 y.o. G1P1001 who presents to maternity admissions reporting tissue hanging from her vagina with sudden onset today.  She started having abdominal cramping and vaginal bleeding this morning, which is normal for her menses with known fibroids, then when she wiped, she felt something hanging out.  She has never had this happen before.  She denies vaginal itching/burning, urinary symptoms, h/a, dizziness, n/v, or fever/chills.     Past Medical History  Diagnosis Date  . Hypertension   . Iron deficiency   . Bronchiectasis   . Urinary tract infection   . Fibroid   . Chlamydia    Past Surgical History  Procedure Date  . Cesarean section    History   Social History  . Marital Status: Single    Spouse Name: N/A    Number of Children: N/A  . Years of Education: N/A   Occupational History  . Not on file.   Social History Main Topics  . Smoking status: Former Games developer  . Smokeless tobacco: Never Used  . Alcohol Use: Yes     occa  . Drug Use: No  . Sexually Active: Yes    Birth Control/ Protection: Condom     last intercourse > 1 year.   Other Topics Concern  . Not on file   Social History Narrative  . No narrative on file   No current facility-administered medications on file prior to encounter.   Current Outpatient Prescriptions on File Prior to Encounter  Medication Sig Dispense Refill  . Olmesartan-Amlodipine-HCTZ (TRIBENZOR) 20-5-12.5 MG TABS Take 1 tablet by mouth 1 day or 1 dose.  30 tablet  0  . Olmesartan-Amlodipine-HCTZ (TRIBENZOR) 20-5-12.5 MG TABS Take 1 tablet by mouth daily.       Allergies  Allergen Reactions  . Shrimp (Shellfish Allergy) Anaphylaxis    ROS: Pertinent items in HPI  OBJECTIVE Blood pressure 139/93, pulse 108, temperature 98.3 F (36.8 C), temperature source Oral, resp. rate 16, height 5'  3" (1.6 m), weight 91.74 kg (202 lb 4 oz), last menstrual period 11/19/2011. GENERAL: Well-developed, well-nourished female in no acute distress.  HEENT: Normocephalic HEART: normal rate RESP: normal effort ABDOMEN: Soft, non-tender EXTREMITIES: Nontender, no edema NEURO: Alert and oriented Pelvic exam:  Pink, shiny 4-5cm in diameter round tissue presenting at introitus.  Tissue is mobile and easily moved into vagina with fingertip, but slides back to original position when finger removed.    Speculum exam:  Speculum easily inserted below protruding tissue and long thin stalk visualized connected to protruding tissue.  Unable to visualize starting point for stalk, whether it is vaginal or cervical in origin.    LAB RESULTS Results for orders placed during the hospital encounter of 11/20/11 (from the past 24 hour(s))  URINALYSIS, ROUTINE W REFLEX MICROSCOPIC     Status: Abnormal   Collection Time   11/20/11  6:12 PM      Component Value Range   Color, Urine RED (*) YELLOW   APPearance HAZY (*) CLEAR   Specific Gravity, Urine 1.025  1.005 - 1.030   pH 6.0  5.0 - 8.0   Glucose, UA NEGATIVE  NEGATIVE mg/dL   Hgb urine dipstick LARGE (*) NEGATIVE   Bilirubin Urine NEGATIVE  NEGATIVE   Ketones, ur NEGATIVE  NEGATIVE mg/dL   Protein, ur NEGATIVE  NEGATIVE mg/dL  Urobilinogen, UA 0.2  0.0 - 1.0 mg/dL   Nitrite NEGATIVE  NEGATIVE   Leukocytes, UA NEGATIVE  NEGATIVE  URINE MICROSCOPIC-ADD ON     Status: Abnormal   Collection Time   11/20/11  6:12 PM      Component Value Range   Squamous Epithelial / LPF FEW (*) RARE   RBC / HPF TOO NUMEROUS TO COUNT  <3 RBC/hpf  POCT PREGNANCY, URINE     Status: Normal   Collection Time   11/20/11  6:20 PM      Component Value Range   Preg Test, Ur NEGATIVE  NEGATIVE   Dr Erin Fulling to room to examine pt.  Speculum placed, good visualization of mass including stalk visualized coming through cervical os or directly from cervix, ring forceps used to  twist until mass easily removed.  Pt tolerated well.  Tissue sent to pathology.  ASSESSMENT 1. Cervical mass   2. Fibroid uterus     PLAN Toradol 60 mg IM x1 dose in MAU Discharge home Message sent to make appointment at Gyn clinic for f/u Pathology report pending Return to MAU as needed  Medication List  As of 11/20/2011  6:59 PM   ASK your doctor about these medications         TRIBENZOR 20-5-12.5 MG Tabs   Generic drug: Olmesartan-Amlodipine-HCTZ   Take 1 tablet by mouth daily.      Olmesartan-Amlodipine-HCTZ 20-5-12.5 MG Tabs   Take 1 tablet by mouth 1 day or 1 dose.            Sharen Counter Certified Nurse-Midwife 11/20/2011  6:59 PM

## 2011-11-20 NOTE — MAU Note (Signed)
Pt states had menstrual cycle that began yesterday, started cramping across whole abdomen, when she wiped today at work, noticed something hanging down, feels flesh like. Pushed object back into vagina, felt heavy, came back out. Has regular menstrual cycles. Denies abnormal vaginal d/c changes prior to cycle beginning.

## 2011-11-22 NOTE — MAU Provider Note (Signed)
I examined the pt with the team.  The pt c/o of a mass falling out of her vagina.  She reports a h/o uterine fibroids with heavy vaginal bleeding.  Began her cycle yesterday.   GU: EGBUS: no lesions Vagina: + blood in vault Cervix:lesion that appears to arise from the endocervix on a stalk that is narrow and 5cm.  Using a ringed forceps, I grasped the lesion and twisted the stalk until the lesion fell off completely.  No bleeding noted at the site.  Tissue sent to pathology. No complications.  Brielyn Bosak L. Harraway-Smith, M.D., [facility]

## 2011-12-07 ENCOUNTER — Ambulatory Visit (INDEPENDENT_AMBULATORY_CARE_PROVIDER_SITE_OTHER): Payer: BC Managed Care – PPO | Admitting: Obstetrics & Gynecology

## 2011-12-07 ENCOUNTER — Encounter: Payer: Self-pay | Admitting: Obstetrics & Gynecology

## 2011-12-07 VITALS — BP 160/106 | HR 97 | Temp 100.1°F | Resp 12 | Wt 201.6 lb

## 2011-12-07 DIAGNOSIS — N72 Inflammatory disease of cervix uteri: Secondary | ICD-10-CM

## 2011-12-07 DIAGNOSIS — N888 Other specified noninflammatory disorders of cervix uteri: Secondary | ICD-10-CM | POA: Insufficient documentation

## 2011-12-07 NOTE — Progress Notes (Signed)
Subjective:     Patient ID: Sheryl Cox, female   DOB: February 26, 1971, 41 y.o.   MRN: [id number]  HPI  Pt was seen in the MAU on 11/21/11 with c/o mass falling from vagina.  Lesion was resected in the MAU.  Pt is here for f/u.  No recurrence in her sx.  No abnl bleeding noted.   Review of Systems n/c    Objective:   Physical Exam BP 160/106  Pulse 97  Temp 100.1 F (37.8 C) (Oral)  Resp 12  Wt 201 lb 9.6 oz (91.445 kg)  LMP 11/20/2011  Pt in NAD GU: EGBUS: no lesions Vagina: no blood in vault Cervix: no lesions; no mucopurulent d/c; cervix well healed   Diagnosis 11/21/11 Endocervical biopsy - LARGE POLYPOID FRAGMENT OF BENIGN SQUAMOUS MUCOSA WITH UNDERLYING ENLARGED NABOTHIAN CYSTS. - NO DYSPLASIA OR MALIGNANCY.    Assessment:     Nabothian cyst/Cervical polyp s/p resection    Plan:     F/u prn  Jamael Hoffmann L. Harraway-Smith, M.D., [place] [place]

## 2011-12-07 NOTE — Patient Instructions (Addendum)
  Hand out on Nabothian cyst and cervical polyp given to pt

## 2012-03-17 ENCOUNTER — Encounter (HOSPITAL_COMMUNITY): Payer: Self-pay

## 2012-03-17 ENCOUNTER — Inpatient Hospital Stay (HOSPITAL_COMMUNITY)
Admission: AD | Admit: 2012-03-17 | Discharge: 2012-03-17 | Disposition: A | Discharge status: Home / Self Care | Payer: BC Managed Care – PPO | Source: Ambulatory Visit | Attending: Obstetrics | Admitting: Obstetrics

## 2012-03-17 DIAGNOSIS — B373 Candidiasis of vulva and vagina: Secondary | ICD-10-CM | POA: Insufficient documentation

## 2012-03-17 DIAGNOSIS — N949 Unspecified condition associated with female genital organs and menstrual cycle: Secondary | ICD-10-CM | POA: Insufficient documentation

## 2012-03-17 DIAGNOSIS — B3731 Acute candidiasis of vulva and vagina: Secondary | ICD-10-CM

## 2012-03-17 HISTORY — DX: Anemia, unspecified: D64.9

## 2012-03-17 LAB — WET PREP, GENITAL

## 2012-03-17 LAB — POCT PREGNANCY, URINE: Preg Test, Ur: NEGATIVE

## 2012-03-17 MED ORDER — TERCONAZOLE 0.4 % VA CREA: 1.0000 | TOPICAL_CREAM | Freq: Every day | VAGINAL | Status: DC

## 2012-03-17 NOTE — MAU Provider Note (Signed)
History     CSN: [id number]  Arrival date and time: 03/17/12 8295   First Provider Initiated Contact with Patient 03/17/12 [phone]      Chief Complaint  Patient presents with  . Vaginal Pain   HPIDorothy R Cox is 41 y.o. G1P1001 Unknown weeks presenting with 1 week history of vaginal irritation-burning.    "I always had discharge"  Hasn't changed.  Patient of Dr. Elsie Stain.  Didn't call office because she states they do not give same day appointment and she is uncomfortable. Sexually active X 1 partner.  Trying to conceive.  LMP 02/21/12.      Past Medical History  Diagnosis Date  . Hypertension   . Iron deficiency   . Bronchiectasis   . Urinary tract infection   . Fibroid   . Chlamydia   . Anemia     Past Surgical History  Procedure Date  . Cesarean section     Family History  Problem Relation Age of Onset  . Anesthesia problems Neg Hx   . Hypertension Mother   . Diabetes Mother   . Cancer Paternal Grandmother   . Cancer Paternal Grandfather     History  Substance Use Topics  . Smoking status: Former Games developer  . Smokeless tobacco: Never Used  . Alcohol Use: No     Comment: occa    Allergies:  Allergies  Allergen Reactions  . Shrimp (Shellfish Allergy) Anaphylaxis    Prescriptions prior to admission  Medication Sig Dispense Refill  . ferrous sulfate 325 (65 FE) MG tablet Take 325 mg by mouth daily with breakfast.      . Olmesartan-Amlodipine-HCTZ (TRIBENZOR) 20-5-12.5 MG TABS Take 1 tablet by mouth 1 day or 1 dose.  30 tablet  0    Review of Systems  Constitutional: Negative.   HENT: Negative.   Respiratory: Negative.   Cardiovascular: Negative.   Gastrointestinal: Negative for nausea and vomiting.  Genitourinary: Positive for dysuria and frequency.       Vaginal burning.     Physical Exam   Blood pressure 153/100, pulse 97, temperature 98.4 F (36.9 C), temperature source Oral, resp. rate 16, height 5' 1.5" (1.562 m), weight 200 lb 12.8 oz  (91.082 kg), last menstrual period 02/21/2012, SpO2 100.00%.  Physical Exam  Constitutional: She is oriented to person, place, and time. She appears well-developed and well-nourished. No distress.  HENT:  Head: Normocephalic.  Neck: Normal range of motion.  Cardiovascular: Normal rate.   Respiratory: Effort normal.  GI: Soft. She exhibits no distension and no mass. There is no tenderness. There is no rebound and no guarding.  Genitourinary: There is no rash, tenderness or lesion on the right labia. There is no rash, tenderness or lesion on the left labia. Uterus is not enlarged and not tender. Cervix exhibits no discharge and no friability. Right adnexum displays no mass, no tenderness and no fullness. Left adnexum displays no mass, no tenderness and no fullness. No erythema, tenderness or bleeding around the vagina. No foreign body around the vagina. No signs of injury around the vagina. Vaginal discharge (small amount of creamy discharge without odor) found.  Neurological: She is alert and oriented to person, place, and time.  Skin: Skin is warm and dry.  Psychiatric: She has a normal mood and affect. Her behavior is normal.   Results for orders placed during the hospital encounter of 03/17/12 (from the past 24 hour(s))  POCT PREGNANCY, URINE     Status: Normal   Collection Time  03/17/12  8:57 AM      Component Value Range   Preg Test, Ur NEGATIVE  NEGATIVE  WET PREP, GENITAL     Status: Abnormal   Collection Time   03/17/12  9:30 AM      Component Value Range   Yeast Wet Prep HPF POC MODERATE (*) NONE SEEN   Trich, Wet Prep NONE SEEN  NONE SEEN   Clue Cells Wet Prep HPF POC NONE SEEN  NONE SEEN   WBC, Wet Prep HPF POC MANY (*) NONE SEEN    MAU Course  Procedures  GC/CHL culture to lab MDM   Assessment and Plan  A:  Yeast vaginitis  P:  Terazol 7 vaginal creme      Follow up with Dr. Cleotis Lema M 03/17/2012, 10:13 AM

## 2012-03-17 NOTE — MAU Note (Signed)
Pt states vaginal pain for a few days, has never had before. Denies abnormal discharge. LMP-02/21/2012. Burning pain on external vagina.

## 2012-03-17 NOTE — MAU Note (Signed)
Patient states she has been having vaginal pain for about one week, has a clear thick discharge that is not new. Denies any other pain. Thinks she may be pregnant.

## 2012-03-18 LAB — GC/CHLAMYDIA PROBE AMP: CT Probe RNA: NEGATIVE

## 2012-05-03 ENCOUNTER — Other Ambulatory Visit: Payer: Self-pay

## 2012-06-25 ENCOUNTER — Encounter (HOSPITAL_COMMUNITY): Payer: Self-pay | Admitting: Emergency Medicine

## 2012-06-25 ENCOUNTER — Emergency Department (HOSPITAL_COMMUNITY)
Admission: EM | Admit: 2012-06-25 | Discharge: 2012-06-25 | Disposition: A | Discharge status: Home / Self Care | Payer: Worker's Compensation | Attending: Emergency Medicine | Admitting: Emergency Medicine

## 2012-06-25 DIAGNOSIS — Y929 Unspecified place or not applicable: Secondary | ICD-10-CM | POA: Insufficient documentation

## 2012-06-25 DIAGNOSIS — H539 Unspecified visual disturbance: Secondary | ICD-10-CM | POA: Insufficient documentation

## 2012-06-25 DIAGNOSIS — S0592XA Unspecified injury of left eye and orbit, initial encounter: Secondary | ICD-10-CM

## 2012-06-25 DIAGNOSIS — Y939 Activity, unspecified: Secondary | ICD-10-CM | POA: Insufficient documentation

## 2012-06-25 DIAGNOSIS — Z8619 Personal history of other infectious and parasitic diseases: Secondary | ICD-10-CM | POA: Insufficient documentation

## 2012-06-25 DIAGNOSIS — X58XXXA Exposure to other specified factors, initial encounter: Secondary | ICD-10-CM | POA: Insufficient documentation

## 2012-06-25 DIAGNOSIS — Z8742 Personal history of other diseases of the female genital tract: Secondary | ICD-10-CM | POA: Insufficient documentation

## 2012-06-25 DIAGNOSIS — Z8709 Personal history of other diseases of the respiratory system: Secondary | ICD-10-CM | POA: Insufficient documentation

## 2012-06-25 DIAGNOSIS — I10 Essential (primary) hypertension: Secondary | ICD-10-CM | POA: Insufficient documentation

## 2012-06-25 DIAGNOSIS — Z79899 Other long term (current) drug therapy: Secondary | ICD-10-CM | POA: Insufficient documentation

## 2012-06-25 DIAGNOSIS — S0510XA Contusion of eyeball and orbital tissues, unspecified eye, initial encounter: Secondary | ICD-10-CM | POA: Insufficient documentation

## 2012-06-25 DIAGNOSIS — Z87440 Personal history of urinary (tract) infections: Secondary | ICD-10-CM | POA: Insufficient documentation

## 2012-06-25 DIAGNOSIS — Z87891 Personal history of nicotine dependence: Secondary | ICD-10-CM | POA: Insufficient documentation

## 2012-06-25 NOTE — ED Notes (Signed)
Pt states she got floor stripper in R eye around 11:30 am.  States R eye is irritated.  States she was having blurry vision and pain earlier that resolved.  Eye irrigated pta.

## 2012-06-25 NOTE — ED Notes (Signed)
Pt dc to home. Pt states understanding to dc instructions. Pt ambulatory to exit without difficulty. 

## 2012-06-25 NOTE — ED Provider Notes (Signed)
History    This chart was scribed for non-physician practitioner Arthor Captain, PA-C working with Joya Gaskins, MD by Gerlean Ren, ED Scribe. This patient was seen in room TR04C/TR04C and the patient's care was started at 10:47 PM.   CSN: [id number]  Arrival date & time 06/25/12  1946   First MD Initiated Contact with Patient 06/25/12 2028      Chief Complaint  Patient presents with  . Eye Pain    The history is provided by the patient. No language interpreter was used.  Sheryl Cox is a 42 y.o. female who presents to the Emergency Department complaining of constant right eye irritation with associated itching since accidentally splashing floor stripper fluid in eye around 11:30 AM today.  Pt flushed the eye with water for 15 minutes immediately following the incident.  Pt reports blurred vision immediately following incident that has gradually improved and is only minimally blurred now.  No redness in eye.  No pain with eye movements, no pain with eyelid movements, no loss of vision or visual disturbance.  Pt does not wear contacts or glasses.  Past Medical History  Diagnosis Date  . Hypertension   . Iron deficiency   . Bronchiectasis   . Urinary tract infection   . Fibroid   . Chlamydia   . Anemia     Past Surgical History  Procedure Laterality Date  . Cesarean section      Family History  Problem Relation Age of Onset  . Anesthesia problems Neg Hx   . Hypertension Mother   . Diabetes Mother   . Cancer Paternal Grandmother   . Cancer Paternal Grandfather     History  Substance Use Topics  . Smoking status: Former Games developer  . Smokeless tobacco: Never Used  . Alcohol Use: No     Comment: occa    OB History   Grav Para Term Preterm Abortions TAB SAB Ect Mult Living   1 1 1  0 0 0 0 0 0 1      Review of Systems  Constitutional: Negative for fever and chills.  HENT: Negative for trouble swallowing.   Eyes: Positive for pain, itching and visual  disturbance (blurred vision). Negative for photophobia, discharge and redness.  Respiratory: Negative for shortness of breath.   Cardiovascular: Negative for chest pain.  Gastrointestinal: Negative for nausea, vomiting, abdominal pain, diarrhea and constipation.  Genitourinary: Negative for dysuria and hematuria.  Musculoskeletal: Negative for myalgias and arthralgias.  Skin: Negative for rash.  Neurological: Negative for numbness.  All other systems reviewed and are negative.    Allergies  Shrimp  Home Medications   Current Outpatient Rx  Name  Route  Sig  Dispense  Refill  . ferrous sulfate 325 (65 FE) MG tablet   Oral   Take 325 mg by mouth daily with breakfast.         . Multiple Vitamins-Minerals (MULTIVITAMIN PO)   Oral   Take 1 tablet by mouth daily.         . Olmesartan-Amlodipine-HCTZ 20-5-12.5 MG TABS   Oral   Take 1 tablet by mouth daily.           BP 156/96  Pulse 95  Temp(Src) 98.7 F (37.1 C) (Oral)  Resp 16  SpO2 100%  LMP 05/31/2012  Physical Exam  Nursing note and vitals reviewed. Constitutional: She is oriented to person, place, and time. She appears well-developed and well-nourished. No distress.  HENT:  Head: Normocephalic and atraumatic.  Eyes: Conjunctivae, EOM and lids are normal. Pupils are equal, round, and reactive to light. No foreign bodies found. Right eye exhibits no chemosis, no discharge, no exudate and no hordeolum. No foreign body present in the right eye. Left eye exhibits no chemosis, no discharge, no exudate and no hordeolum. No foreign body present in the left eye. No scleral icterus.  Fundoscopic exam:      The right eye shows no arteriolar narrowing, no AV nicking, no exudate and no hemorrhage. The right eye shows red reflex.       The left eye shows no arteriolar narrowing, no AV nicking, no exudate and no hemorrhage. The left eye shows red reflex.  Slit lamp exam:      The right eye shows no foreign body, no hyphema  and no hypopyon.       The left eye shows no foreign body, no hyphema and no hypopyon.  Litmus paper applie and pH 7.0 BL.   Neck: Normal range of motion. Neck supple. No tracheal deviation present.  Cardiovascular: Normal rate, regular rhythm and normal heart sounds.  Exam reveals no gallop and no friction rub.   No murmur heard. Pulmonary/Chest: Effort normal and breath sounds normal. No respiratory distress.  Abdominal: Soft. Bowel sounds are normal. She exhibits no distension and no mass. There is no tenderness. There is no guarding.  Musculoskeletal: Normal range of motion.  Neurological: She is alert and oriented to person, place, and time.  Skin: Skin is warm and dry. She is not diaphoretic.  Psychiatric: She has a normal mood and affect. Her behavior is normal.    ED Course  Procedures (including critical care time) DIAGNOSTIC STUDIES: Oxygen Saturation is 100% on room air, normal by my interpretation.    COORDINATION OF CARE: 10:57 PM- Discussed litmus paper test with pt and she verbalizes understanding and consent.  Performed test.  Informed pt that test shows equal and normal pH in bilateral eyes.  Discussed d/c with pt and she agrees.   1. Eye injuries, left, initial encounter       MDM  Patient with chemical exposure to the eye.  No abnormilities and asxs at this time with normal visual acuity. NOrmal pH BL.  The patient appears reasonably screened and/or stabilized for discharge and I doubt any other medical condition or other [facility] requiring further screening, evaluation, or treatment in the ED at this time prior to discharge.      I personally performed the services described in this documentation, which was scribed in my presence. The recorded information has been reviewed and is accurate.     Arthor Captain, PA-C 06/28/12 1348

## 2012-06-29 NOTE — ED Provider Notes (Signed)
Medical screening examination/treatment/procedure(s) were performed by non-physician practitioner and as supervising physician I was immediately available for consultation/collaboration.   Joya Gaskins, MD 06/29/12 [phone]

## 2012-07-02 ENCOUNTER — Emergency Department (HOSPITAL_COMMUNITY)
Admission: EM | Admit: 2012-07-02 | Discharge: 2012-07-02 | Disposition: A | Discharge status: Home / Self Care | Payer: BC Managed Care – PPO | Attending: Emergency Medicine | Admitting: Emergency Medicine

## 2012-07-02 ENCOUNTER — Encounter (HOSPITAL_COMMUNITY): Payer: Self-pay | Admitting: Emergency Medicine

## 2012-07-02 DIAGNOSIS — D500 Iron deficiency anemia secondary to blood loss (chronic): Secondary | ICD-10-CM | POA: Insufficient documentation

## 2012-07-02 DIAGNOSIS — Z8742 Personal history of other diseases of the female genital tract: Secondary | ICD-10-CM | POA: Insufficient documentation

## 2012-07-02 DIAGNOSIS — Z87440 Personal history of urinary (tract) infections: Secondary | ICD-10-CM | POA: Insufficient documentation

## 2012-07-02 DIAGNOSIS — Z87891 Personal history of nicotine dependence: Secondary | ICD-10-CM | POA: Insufficient documentation

## 2012-07-02 DIAGNOSIS — Z79899 Other long term (current) drug therapy: Secondary | ICD-10-CM | POA: Insufficient documentation

## 2012-07-02 DIAGNOSIS — Z8619 Personal history of other infectious and parasitic diseases: Secondary | ICD-10-CM | POA: Insufficient documentation

## 2012-07-02 DIAGNOSIS — D649 Anemia, unspecified: Secondary | ICD-10-CM

## 2012-07-02 DIAGNOSIS — Z8709 Personal history of other diseases of the respiratory system: Secondary | ICD-10-CM | POA: Insufficient documentation

## 2012-07-02 DIAGNOSIS — I10 Essential (primary) hypertension: Secondary | ICD-10-CM | POA: Insufficient documentation

## 2012-07-02 LAB — CBC WITH DIFFERENTIAL/PLATELET
Basophils Relative: 0 % (ref 0–1)
Eosinophils Relative: 1 % (ref 0–5)
Hemoglobin: 6.7 g/dL — CL (ref 12.0–15.0)
MCH: 17 pg — ABNORMAL LOW (ref 26.0–34.0)
MCV: 60.5 fL — ABNORMAL LOW (ref 78.0–100.0)
Monocytes Relative: 8 % (ref 3–12)
Neutrophils Relative %: 68 % (ref 43–77)
Platelets: 344 10*3/uL (ref 150–400)
RBC: 3.95 MIL/uL (ref 3.87–5.11)
WBC: 5 10*3/uL (ref 4.0–10.5)

## 2012-07-02 LAB — BASIC METABOLIC PANEL
CO2: 22 mEq/L (ref 19–32)
Chloride: 103 mEq/L (ref 96–112)
GFR calc Af Amer: >90 mL/min (ref >90)
Potassium: 3.9 mEq/L (ref 3.5–5.1)
Sodium: 135 mEq/L (ref 135–145)

## 2012-07-02 LAB — OCCULT BLOOD, POC DEVICE: Fecal Occult Bld: NEGATIVE

## 2012-07-02 MED ORDER — OLMESARTAN MEDOXOMIL 20 MG PO TABS
20.0000 mg | ORAL_TABLET | Freq: Every day | ORAL | Status: DC
  Administered 2012-07-02: 20 mg via ORAL
  Filled 2012-07-02: qty 1

## 2012-07-02 MED ORDER — AMLODIPINE BESYLATE 5 MG PO TABS
5.0000 mg | ORAL_TABLET | Freq: Every day | ORAL | Status: DC
  Administered 2012-07-02: 5 mg via ORAL
  Filled 2012-07-02: qty 1

## 2012-07-02 MED ORDER — HYDROCHLOROTHIAZIDE 12.5 MG PO CAPS
12.5000 mg | ORAL_CAPSULE | Freq: Every day | ORAL | Status: DC
  Administered 2012-07-02: 12.5 mg via ORAL
  Filled 2012-07-02: qty 1

## 2012-07-02 MED ORDER — OLMESARTAN-AMLODIPINE-HCTZ 20-5-12.5 MG PO TABS: 1.0000 | ORAL_TABLET | Freq: Every morning | ORAL | Status: DC

## 2012-07-02 MED ORDER — FERROUS SULFATE 325 (65 FE) MG PO TABS: 325.0000 mg | ORAL_TABLET | Freq: Every day | ORAL | Status: DC

## 2012-07-02 NOTE — ED Notes (Signed)
Pt reports being out of her b/p meds for 3 weeks. Pt also sts heavy periods and thinks her iron level is low. Pt denies pain, in NAD.

## 2012-07-02 NOTE — ED Notes (Signed)
Lab called with critical results: Hgb 6.7 Dr Adriana Simas notified.

## 2012-07-02 NOTE — ED Notes (Signed)
Pt states that she began feeling weak, dizzy, and like her BP was elevated this morning.  States she also just finished her period which was very heavy and states she feels like "her iron is low".  Pt A&O x 4.

## 2012-07-03 NOTE — ED Provider Notes (Addendum)
History     CSN: [id number]  Arrival date & time 07/02/12  1113   First MD Initiated Contact with Patient 07/02/12 1138      Chief Complaint  Patient presents with  . Hypertension  . Dizziness  . Abdominal Pain    (Consider location/radiation/quality/duration/timing/severity/associated sxs/prior treatment) HPI... Dizziness since this morning. Patient has not been taking her blood pressure medicine for 3 weeks.  Additionally patient has a known history of anemia. She is ambulatory without neurological deficits. No chest pain, dyspnea.  Her anemia has been related to excessive blood flow during menstrual periods.  Severity is moderate  Past Medical History  Diagnosis Date  . Hypertension   . Iron deficiency   . Bronchiectasis   . Urinary tract infection   . Fibroid   . Chlamydia   . Anemia     Past Surgical History  Procedure Laterality Date  . Cesarean section      Family History  Problem Relation Age of Onset  . Anesthesia problems Neg Hx   . Hypertension Mother   . Diabetes Mother   . Cancer Paternal Grandmother   . Cancer Paternal Grandfather     History  Substance Use Topics  . Smoking status: Former Games developer  . Smokeless tobacco: Never Used  . Alcohol Use: No     Comment: occa    OB History   Grav Para Term Preterm Abortions TAB SAB Ect Mult Living   1 1 1  0 0 0 0 0 0 1      Review of Systems  All other systems reviewed and are negative.    Allergies  Shrimp  Home Medications   Current Outpatient Rx  Name  Route  Sig  Dispense  Refill  . ferrous sulfate 325 (65 FE) MG tablet   Oral   Take 325 mg by mouth daily with breakfast.         . Multiple Vitamins-Minerals (MULTIVITAMIN PO)   Oral   Take 1 tablet by mouth daily.         . Olmesartan-Amlodipine-HCTZ 20-5-12.5 MG TABS   Oral   Take 1 tablet by mouth daily.         . ferrous sulfate 325 (65 FE) MG tablet   Oral   Take 1 tablet (325 mg total) by mouth daily.   30 tablet  1   . Olmesartan-Amlodipine-HCTZ (TRIBENZOR) 20-5-12.5 MG TABS   Oral   Take 1 tablet by mouth every morning.   30 tablet   1     BP 150/96  Pulse 90  Temp(Src) 98.4 F (36.9 C) (Oral)  Resp 18  SpO2 100%  LMP 06/27/2012  Physical Exam  Nursing note and vitals reviewed. Constitutional: She is oriented to person, place, and time. She appears well-developed and well-nourished.  HENT:  Head: Normocephalic and atraumatic.  Eyes: Conjunctivae and EOM are normal. Pupils are equal, round, and reactive to light.  Neck: Normal range of motion. Neck supple.  Cardiovascular: Normal rate, regular rhythm and normal heart sounds.   Pulmonary/Chest: Effort normal and breath sounds normal.  Abdominal: Soft. Bowel sounds are normal.  Musculoskeletal: Normal range of motion.  Neurological: She is alert and oriented to person, place, and time.  Skin: Skin is warm and dry.  Psychiatric: She has a normal mood and affect.    ED Course  Procedures (including critical care time)  Labs Reviewed  CBC WITH DIFFERENTIAL - Abnormal; Notable for the following:    Hemoglobin 6.7 (*)  HCT 23.9 (*)    MCV 60.5 (*)    MCH 17.0 (*)    MCHC 28.0 (*)    RDW 22.0 (*)    All other components within normal limits  BASIC METABOLIC PANEL  OCCULT BLOOD, POC DEVICE   No results found.   1. Hypertension   2. Anemia       MDM  Patient has had long-standing anemia. Has not been taking her iron. Strongly encouraged her to return to iron replacement.  Will get GYN consultation for a hysterectomy.  Blood pressure medication refill.          Donnetta Hutching, MD 07/05/12 4098  Donnetta Hutching, MD 07/16/12 1538

## 2012-10-20 ENCOUNTER — Other Ambulatory Visit (HOSPITAL_COMMUNITY): Payer: Self-pay | Admitting: Family Medicine

## 2012-10-20 DIAGNOSIS — Z1231 Encounter for screening mammogram for malignant neoplasm of breast: Secondary | ICD-10-CM

## 2012-10-23 ENCOUNTER — Ambulatory Visit (HOSPITAL_COMMUNITY)
Admission: RE | Admit: 2012-10-23 | Discharge: 2012-10-23 | Disposition: A | Discharge status: Home / Self Care | Payer: BC Managed Care – PPO | Source: Ambulatory Visit | Attending: Family Medicine | Admitting: Family Medicine

## 2012-10-23 DIAGNOSIS — Z1231 Encounter for screening mammogram for malignant neoplasm of breast: Secondary | ICD-10-CM | POA: Insufficient documentation

## 2012-10-24 ENCOUNTER — Other Ambulatory Visit: Payer: Self-pay | Admitting: Family Medicine

## 2012-10-24 DIAGNOSIS — R928 Other abnormal and inconclusive findings on diagnostic imaging of breast: Secondary | ICD-10-CM

## 2012-11-06 ENCOUNTER — Ambulatory Visit
Admission: RE | Admit: 2012-11-06 | Discharge: 2012-11-06 | Disposition: A | Discharge status: Home / Self Care | Payer: BC Managed Care – PPO | Source: Ambulatory Visit | Attending: Family Medicine | Admitting: Family Medicine

## 2012-11-06 ENCOUNTER — Other Ambulatory Visit: Payer: Self-pay | Admitting: Family Medicine

## 2012-11-06 DIAGNOSIS — R921 Mammographic calcification found on diagnostic imaging of breast: Secondary | ICD-10-CM

## 2012-11-06 DIAGNOSIS — R928 Other abnormal and inconclusive findings on diagnostic imaging of breast: Secondary | ICD-10-CM

## 2012-11-17 DIAGNOSIS — C50919 Malignant neoplasm of unspecified site of unspecified female breast: Secondary | ICD-10-CM

## 2012-11-17 HISTORY — DX: Malignant neoplasm of unspecified site of unspecified female breast: C50.919

## 2012-11-18 ENCOUNTER — Telehealth: Payer: Self-pay | Admitting: Hematology and Oncology

## 2012-11-18 NOTE — Telephone Encounter (Signed)
S/W PT IN RE TO NP APPT 09/24 @ 3 W/DR. CHISM REFERRING DR. Adrian Saran [facility]  [facility]  WELCOME PACKET MAILED.

## 2012-11-20 ENCOUNTER — Ambulatory Visit
Admission: RE | Admit: 2012-11-20 | Discharge: 2012-11-20 | Disposition: A | Discharge status: Home / Self Care | Payer: BC Managed Care – PPO | Source: Ambulatory Visit | Attending: Family Medicine | Admitting: Family Medicine

## 2012-11-20 ENCOUNTER — Other Ambulatory Visit: Payer: Self-pay | Admitting: Family Medicine

## 2012-11-20 DIAGNOSIS — R921 Mammographic calcification found on diagnostic imaging of breast: Secondary | ICD-10-CM

## 2012-11-21 ENCOUNTER — Ambulatory Visit
Admission: RE | Admit: 2012-11-21 | Discharge: 2012-11-21 | Disposition: A | Discharge status: Home / Self Care | Payer: BC Managed Care – PPO | Source: Ambulatory Visit | Attending: Family Medicine | Admitting: Family Medicine

## 2012-11-21 ENCOUNTER — Other Ambulatory Visit: Payer: Self-pay | Admitting: Family Medicine

## 2012-11-21 DIAGNOSIS — R921 Mammographic calcification found on diagnostic imaging of breast: Secondary | ICD-10-CM

## 2012-11-21 DIAGNOSIS — D0511 Intraductal carcinoma in situ of right breast: Secondary | ICD-10-CM

## 2012-11-24 ENCOUNTER — Telehealth: Payer: Self-pay | Admitting: *Deleted

## 2012-11-24 DIAGNOSIS — C50411 Malignant neoplasm of upper-outer quadrant of right female breast: Secondary | ICD-10-CM

## 2012-11-24 NOTE — Telephone Encounter (Signed)
Called and spoke with patient and confirmed [facility] appt for 11/26/12 at 12N.  Instructions and contact information given.  No other questions or concerns at this time.

## 2012-11-26 ENCOUNTER — Ambulatory Visit: Payer: BC Managed Care – PPO | Attending: Surgery | Admitting: Physical Therapy

## 2012-11-26 ENCOUNTER — Encounter: Payer: Self-pay | Admitting: Oncology

## 2012-11-26 ENCOUNTER — Encounter: Payer: Self-pay | Admitting: *Deleted

## 2012-11-26 ENCOUNTER — Other Ambulatory Visit (HOSPITAL_BASED_OUTPATIENT_CLINIC_OR_DEPARTMENT_OTHER): Payer: BC Managed Care – PPO | Admitting: Lab

## 2012-11-26 ENCOUNTER — Telehealth: Payer: Self-pay | Admitting: *Deleted

## 2012-11-26 ENCOUNTER — Ambulatory Visit
Admission: RE | Admit: 2012-11-26 | Discharge: 2012-11-26 | Disposition: A | Discharge status: Home / Self Care | Payer: BC Managed Care – PPO | Source: Ambulatory Visit | Attending: Family Medicine | Admitting: Family Medicine

## 2012-11-26 ENCOUNTER — Ambulatory Visit (HOSPITAL_BASED_OUTPATIENT_CLINIC_OR_DEPARTMENT_OTHER): Payer: BC Managed Care – PPO | Admitting: Surgery

## 2012-11-26 ENCOUNTER — Ambulatory Visit: Payer: BC Managed Care – PPO

## 2012-11-26 ENCOUNTER — Ambulatory Visit (HOSPITAL_BASED_OUTPATIENT_CLINIC_OR_DEPARTMENT_OTHER): Payer: BC Managed Care – PPO | Admitting: Oncology

## 2012-11-26 ENCOUNTER — Ambulatory Visit
Admission: RE | Admit: 2012-11-26 | Discharge: 2012-11-26 | Disposition: A | Discharge status: Home / Self Care | Payer: BC Managed Care – PPO | Source: Ambulatory Visit | Attending: Radiation Oncology | Admitting: Radiation Oncology

## 2012-11-26 VITALS — BP 127/84 | HR 114 | Temp 98.3°F | Resp 20 | Ht 61.5 in | Wt 209.6 lb

## 2012-11-26 DIAGNOSIS — C50919 Malignant neoplasm of unspecified site of unspecified female breast: Secondary | ICD-10-CM | POA: Insufficient documentation

## 2012-11-26 DIAGNOSIS — C50419 Malignant neoplasm of upper-outer quadrant of unspecified female breast: Secondary | ICD-10-CM

## 2012-11-26 DIAGNOSIS — C50411 Malignant neoplasm of upper-outer quadrant of right female breast: Secondary | ICD-10-CM

## 2012-11-26 DIAGNOSIS — IMO0001 Reserved for inherently not codable concepts without codable children: Secondary | ICD-10-CM | POA: Insufficient documentation

## 2012-11-26 DIAGNOSIS — D0511 Intraductal carcinoma in situ of right breast: Secondary | ICD-10-CM

## 2012-11-26 DIAGNOSIS — D0510 Intraductal carcinoma in situ of unspecified breast: Secondary | ICD-10-CM | POA: Insufficient documentation

## 2012-11-26 DIAGNOSIS — Z9010 Acquired absence of unspecified breast and nipple: Secondary | ICD-10-CM | POA: Insufficient documentation

## 2012-11-26 DIAGNOSIS — R293 Abnormal posture: Secondary | ICD-10-CM | POA: Insufficient documentation

## 2012-11-26 DIAGNOSIS — D0590 Unspecified type of carcinoma in situ of unspecified breast: Secondary | ICD-10-CM

## 2012-11-26 LAB — CBC WITH DIFFERENTIAL/PLATELET
Basophils Absolute: 0.1 10*3/uL (ref 0.0–0.1)
EOS%: 1 % (ref 0.0–7.0)
Eosinophils Absolute: 0.1 10*3/uL (ref 0.0–0.5)
HCT: 31 % — ABNORMAL LOW (ref 34.8–46.6)
HGB: 9.9 g/dL — ABNORMAL LOW (ref 11.6–15.9)
MCH: 22.4 pg — ABNORMAL LOW (ref 25.1–34.0)
MONO#: 0.3 10*3/uL (ref 0.1–0.9)
NEUT#: 3.8 10*3/uL (ref 1.5–6.5)
NEUT%: 65.2 % (ref 38.4–76.8)
RDW: 17.7 % — ABNORMAL HIGH (ref 11.2–14.5)
WBC: 5.8 10*3/uL (ref 3.9–10.3)
lymph#: 1.5 10*3/uL (ref 0.9–3.3)

## 2012-11-26 LAB — COMPREHENSIVE METABOLIC PANEL (CC13)
AST: 13 U/L (ref 5–34)
Albumin: 3.8 g/dL (ref 3.5–5.0)
BUN: 11.5 mg/dL (ref 7.0–26.0)
Calcium: 9.8 mg/dL (ref 8.4–10.4)
Chloride: 103 mEq/L (ref 98–109)
Glucose: 149 mg/dl — ABNORMAL HIGH (ref 70–140)
Potassium: 3.6 mEq/L (ref 3.5–5.1)
Sodium: 138 mEq/L (ref 136–145)
Total Protein: 7.8 g/dL (ref 6.4–8.3)

## 2012-11-26 MED ORDER — TAMOXIFEN CITRATE 20 MG PO TABS: 20.0000 mg | ORAL_TABLET | Freq: Every day | ORAL | Status: AC

## 2012-11-26 MED ORDER — GADOBENATE DIMEGLUMINE 529 MG/ML IV SOLN
20.0000 mL | Freq: Once | INTRAVENOUS | Status: AC | PRN
  Administered 2012-11-26: 20 mL via INTRAVENOUS

## 2012-11-26 NOTE — Patient Instructions (Addendum)
Proceed with tamoxifen 20 mg daily  I will see you back in 6 weeks   Tamoxifen oral tablet What is this medicine? TAMOXIFEN (ta MOX i fen) blocks the effects of estrogen. It is commonly used to treat breast cancer. It is also used to decrease the chance of breast cancer coming back in women who have received treatment for the disease. It may also help prevent breast cancer in women who have a high risk of developing breast cancer. This medicine may be used for other purposes; ask your health care provider or pharmacist if you have questions. What should I tell my health care provider before I take this medicine? They need to know if you have any of these conditions: -blood clots -blood disease -cataracts or impaired eyesight -endometriosis -high calcium levels -high cholesterol -irregular menstrual cycles -liver disease -stroke -uterine fibroids -an unusual or allergic reaction to tamoxifen, other medicines, foods, dyes, or preservatives -pregnant or trying to get pregnant -breast-feeding How should I use this medicine? Take this medicine by mouth with a glass of water. Follow the directions on the prescription label. You can take it with or without food. Take your medicine at regular intervals. Do not take your medicine more often than directed. Do not stop taking except on your doctor's advice. A special MedGuide will be given to you by the pharmacist with each prescription and refill. Be sure to read this information carefully each time. Talk to your pediatrician regarding the use of this medicine in children. While this drug may be prescribed for selected conditions, precautions do apply. Overdosage: If you think you have taken too much of this medicine contact a poison control center or emergency room at once. NOTE: This medicine is only for you. Do not share this medicine with others. What if I miss a dose? If you miss a dose, take it as soon as you can. If it is almost time for  your next dose, take only that dose. Do not take double or extra doses. What may interact with this medicine? -aminoglutethimide -bromocriptine -chemotherapy drugs -female hormones, like estrogens and birth control pills -letrozole -medroxyprogesterone -phenobarbital -rifampin -warfarin This list may not describe all possible interactions. Give your health care provider a list of all the medicines, herbs, non-prescription drugs, or dietary supplements you use. Also tell them if you smoke, drink alcohol, or use illegal drugs. Some items may interact with your medicine. What should I watch for while using this medicine? Visit your doctor or health care professional for regular checks on your progress. You will need regular pelvic exams, breast exams, and mammograms. If you are taking this medicine to reduce your risk of getting breast cancer, you should know that this medicine does not prevent all types of breast cancer. If breast cancer or other problems occur, there is no guarantee that it will be found at an early stage. Do not become pregnant while taking this medicine or for 2 months after stopping this medicine. Stop taking this medicine if you get pregnant or think you are pregnant and contact your doctor. This medicine may harm your unborn baby. Women who can possibly become pregnant should use birth control methods that do not use hormones during tamoxifen treatment and for 2 months after therapy has stopped. Talk with your health care provider for birth control advice. Do not breast feed while taking this medicine. What side effects may I notice from receiving this medicine? Side effects that you should report to your doctor or  health care professional as soon as possible: -changes in vision (blurred vision) -changes in your menstrual cycle -difficulty breathing or shortness of breath -difficulty walking or talking -new breast lumps -numbness -pelvic pain or pressure -redness,  blistering, peeling or loosening of the skin, including inside the mouth -skin rash or itching (hives) -sudden chest pain -swelling of lips, face, or tongue -swelling, pain or tenderness in your calf or leg -unusual bruising or bleeding -vaginal discharge that is bloody, brown, or rust -weakness -yellowing of the whites of the eyes or skin Side effects that usually do not require medical attention (report to your doctor or health care professional if they continue or are bothersome): -fatigue -hair loss, although uncommon and is usually mild -headache -hot flashes -impotence (in men) -nausea, vomiting (mild) -vaginal discharge (white or clear) This list may not describe all possible side effects. Call your doctor for medical advice about side effects. You may report side effects to FDA at 1-800-FDA-1088. Where should I keep my medicine? Keep out of the reach of children. Store at room temperature between 20 and 25 degrees C (68 and 77 degrees F). Protect from light. Keep container tightly closed. Throw away any unused medicine after the expiration date. NOTE: This sheet is a summary. It may not cover all possible information. If you have questions about this medicine, talk to your doctor, pharmacist, or health care provider.  2012, [employer]. (11/20/2007 12:01:56 PM)

## 2012-11-26 NOTE — Patient Instructions (Signed)
Mastectomy, With or Without Reconstruction Mastectomy (removal of the breast) is a procedure most commonly used to treat cancer (tumor) of the breast. Different procedures are available for treatment. This depends on the stage of the tumor (abnormal growths). Discuss this with your caregiver, surgeon (a specialist for performing operations such as this), or oncologist (someone specialized in the treatment of cancer). With proper information, you can decide which treatment is best for you. Although the sound of the word cancer is frightening to all of us, the new treatments and medications can be a source of reassurance and comfort. If there are things you are worried about, discuss them with your caregiver. He or she can help comfort you and your family. Some of the different procedures for treating breast cancer are:  Radical (extensive) mastectomy. This is an operation used to remove the entire breast, the muscles under the breast, and all of the glands (lymph nodes) under the arm. With all of the new treatments available for cancer of the breast, this procedure has become less common.  Modified radical mastectomy. This is a similar operation to the radical mastectomy described above. In the modified radical mastectomy, the muscles of the chest wall are not removed unless one of the lessor muscles is removed. One of the lessor muscles may be removed to allow better removal of the lymph nodes. The axillary lymph nodes are also removed. Rarely, during an axillary node dissection nerves to this area are damaged. Radiation therapy is then often used to the area following this surgery.  A total mastectomy also known as a complete or simple mastectomy. It involves removal of only the breast. The lymph nodes and the muscles are left in place.  In a lumpectomy, the lump is removed from the breast. This is the simplest form of surgical treatment. A sentinel lymph node biopsy may also be done. Additional treatment  may be required. RISKS AND COMPLICATIONS The main problems that follow removal of the breast include:  Infection (germs start growing in the wound). This can usually be treated with antibiotics (medications that kill germs).  Lymphedema. This means the arm on the side of the breast that was operated on swells because the lymph (tissue fluid) cannot follow the main channels back into the body. This only occurs when the lymph nodes have had to be removed under the arm.  There may be some areas of numbness to the upper arm and around the incision (cut by the surgeon) in the breast. This happens because of the cutting of or damage to some of the nerves in the area. This is most often unavoidable.  There may be difficulty moving the arm in a full range of motion (moving in all directions) following surgery. This usually improves with time following use and exercise.  Recurrence of breast cancer may happen with the very best of surgery and follow up treatment. Sometimes small cancer cells that cannot be seen with the naked eye have already spread at the time of surgery. When this happens other treatment is available. This treatment may be radiation, medications or a combination of both. RECONSTRUCTION Reconstruction of the breast may be done immediately if there is not going to be post-operative radiation. This surgery is done for cosmetic (improve appearance) purposes to improve the physical appearance after the operation. This may be done in two ways:  It can be done using a saline filled prosthetic (an artificial breast which is filled with salt water). Silicone breast implants are now   re-approved by the FDA and are being commonly used.  Reconstruction can be done using the body's own muscle/fat/skin. Your caregiver will discuss your options with you. Depending upon your needs or choice, together you will be able to determine which procedure is best for you. Document Released: 11/28/2000 Document  Revised: 11/28/2011 Document Reviewed: 07/22/2007 [employer] Patient Information 2014 [employer].  

## 2012-11-26 NOTE — Progress Notes (Signed)
Sheryl Cox [id number] [birth year] 41 y.o. 11/26/2012 2:10 PM  CC  Geraldo Pitter, MD [address] [place] [address] Dr. Harriette Bouillon Dr. Kenza Puffer  REASON FOR CONSULTATION:  42 year old female with new diagnosis of ductal carcinoma in situ of the right breast. Patient is seen in the multidisciplinary breast clinic for discussion of treatment options.  STAGE:   Breast cancer of upper-outer quadrant of right female breast   Primary site: Breast (Right)   Staging method: AJCC 7th Edition   Clinical: Stage 0 (Tis (DCIS), N0, cM0)   Summary: Stage 0 (Tis (DCIS), N0, cM0)  REFERRING PHYSICIAN: Dr. Maisie Fus Cornett  HISTORY OF PRESENT ILLNESS:  Sheryl Cox is a 42 y.o. female.  With past medical history significant for hypertension iron deficiency and bronchiectasis. ppatient had a screening mammogram performed that showed suspicious calcifications this area measured about 6 cm. She had a stereotactic biopsy performed of 2 areas. The pathology was positive for ductal carcinoma in situ with calcifications in both areas. The tumor was ER positive PR positive. MRI is pending. Her case was discussed at the multidisciplinary breast conference. Her pathology and radiology were reviewed. It was felt that patient was not a good breast conservation candidate and therefore mastectomy was felt to be the most appropriate modality. She is without any complaints. She is accompanied by her family.   Past Medical History: Past Medical History  Diagnosis Date  . Hypertension   . Iron deficiency   . Bronchiectasis   . Urinary tract infection   . Fibroid   . Chlamydia   . Anemia     Past Surgical History: Past Surgical History  Procedure Laterality Date  . Cesarean section      Family History: Family History  Problem Relation Age of Onset  . Anesthesia problems Neg Hx   . Hypertension Mother   . Diabetes Mother   . Cancer Paternal Grandmother     breast  . Cancer  Paternal Grandfather   . Prostate cancer Father   . Prostate cancer Maternal Uncle     Social History History  Substance Use Topics  . Smoking status: Never Smoker   . Smokeless tobacco: Never Used  . Alcohol Use: Yes     Comment: occa    Allergies: Allergies  Allergen Reactions  . Shrimp [Shellfish Allergy] Anaphylaxis    Current Medications: Current Outpatient Prescriptions  Medication Sig Dispense Refill  . ferrous sulfate 325 (65 FE) MG tablet Take 1 tablet (325 mg total) by mouth daily.  30 tablet  1  . Multiple Vitamins-Minerals (MULTIVITAMIN PO) Take 1 tablet by mouth daily.      . Olmesartan-Amlodipine-HCTZ (TRIBENZOR) 20-5-12.5 MG TABS Take 1 tablet by mouth every morning.  30 tablet  1  . tamoxifen (NOLVADEX) 20 MG tablet Take 1 tablet (20 mg total) by mouth daily.  90 tablet  12   No current facility-administered medications for this visit.    OB/GYN History:menarche at age 27 she is premenopausal she has not been on hormone replacement therapy first live birth was 83  Fertility Discussion: patient and I discussed extensively fertility preservation. She is uncertain what she wants to do but after hearing that she needed to proceed with surgery patient felt that she would like to have her breast cancer treated. She feels that she can always a. Prior History of Cancer:no  Health Maintenance:  Colonoscopy no Bone Densityno Last PAP smear yes  ECOG PERFORMANCE STATUS: 0 -  Asymptomatic  Genetic Counseling/testing:yes  REVIEW OF SYSTEMS:  A comprehensive review of systems was negative.  PHYSICAL EXAMINATION: Blood pressure 127/84, pulse 114, temperature 98.3 F (36.8 C), temperature source Oral, resp. rate 20, height 5' 1.5" (1.562 m), weight 209 lb 9.6 oz (95.074 kg), last menstrual period 10/28/2012.  ZOX:WRUEA, healthy, no distress, well nourished and well developed SKIN: skin color, texture, turgor are normal HEAD: Normocephalic EYES: PERRLA, EOMI,  Conjunctiva are pink and non-injected EARS: External ears normal OROPHARYNX:no exudate, no erythema and lips, buccal mucosa, and tongue normal  NECK: supple, no adenopathy LYMPH:  no palpable lymphadenopathy BREAST:left breast normal without mass, skin or nipple changes or axillary nodes, abnormal mass palpable in the right breast from a hematoma LUNGS: clear to auscultation and percussion HEART: regular rate & rhythm, no murmurs and no gallops ABDOMEN:abdomen soft, non-tender, obese, normal bowel sounds and no masses or organomegaly BACK: Back symmetric, no curvature., No CVA tenderness EXTREMITIES:less then 2 second capillary refill, no joint deformities, effusion, or inflammation, no edema, no clubbing, no cyanosis  NEURO: alert & oriented x 3 with fluent speech, no focal motor/sensory deficits, gait normal, reflexes normal and symmetric     STUDIES/RESULTS: Mr Breast Bilateral W Wo Contrast  11/26/2012   *RADIOLOGY REPORT*  Clinical Data:  Patient with new diagnosis of DCIS in the right breast.  MRI BILATERAL BREAST WITHOUT AND WITH CONTRAST  Technique: Multiplanar, multisequence MR images of the both breasts were obtained prior to and following the intravenous administration of 20ml of Multihance.  Labs: BUN and creatinine were obtained on site at [facility] at [address] Creatinine is 0.9, BUN is 12, and GFR is 83.  Comparison:  Recent imaging examinations.  FINDINGS:  Breast composition:  b:  There are scattered areas of fibroglandular density.  Background parenchymal enhancement:  Moderate  Left breast:  In the upper inner quadrant of the posterior left breast, there is an 8 x 7 x 7 mm circumscribed enhancing mass (image 93, series 6). The mass is hyperintense on T2 images. There is associated plateau kinetics. While this may represent a lymph node, no fatty hilum can definitely be identified. At approximately 3 o'clock in the left breast (middle third) there is an  enhancing T2 hyperintense mass with a fatty hilum, consistent with a lymph node.  This lymph node can also be seen on the 2013 screening mammogram. No other areas of abnormal enhancement.  Right breast:  At 12 o'clock in the right breast (middle third) there are two adjacent biopsy site cavities.  The more lateral biopsy site measures 0.9 x 1.4 x 2.8 cm and the more medial site measures 2 x 1 x 1.1 cm. There is no definite surrounding enhancement to correlate with the large area of calcifications seen mammographically.  Lymph nodes:  Prominent axillary lymph nodes are seen bilaterally without abnormal morphology.  Ancillary findings:  None.  IMPRESSION: 1. At 12 o'clock in the right breast, two biopsy site cavities are identified. No non mass enhancement is identified to correlate with the large area of calcifications seen mammographically.  Therefore, MRI is likely underestimating the extent of disease.  2.  Enhancing left breast mass measuring up to 8 mm, which is T2 hyperintense and may represent a lymph node.  RECOMMENDATION:  Left breast ultrasound to assess for MRI detected mass. MRI guided biopsy should be considered if ultrasound is negative.  BI-RADS CATEGORY 0:  Incomplete.  Need additional imaging evaluation and/or prior mammograms for comparison.  THREE-DIMENSIONAL MR IMAGE RENDERING ON INDEPENDENT WORKSTATION:  Three-dimensional MR images were rendered by post-processing of the original MR data on a DynaCad workstation.  The three-dimensional MR images were interpreted, and findings were reported in the accompanying complete MRI report for this study.   Original Report Authenticated By: Jerene Dilling, M.D.   [employer] R  11/06/2012   *RADIOLOGY REPORT*  Clinical Data:  The patient returns for evaluation of right breast calcifications.  DIGITAL DIAGNOSTIC RIGHT MAMMOGRAM  Comparison: 10/23/2012 and earlier  Findings:  ACR Breast Density Category b:  There are scattered areas of  fibroglandular density.  Magnified views are performed of calcifications in the lateral portion of the right breast.  Numerous calcifications appear both linear in morphology and linear in distribution and extend over a large portion of the breast.  The longest area is 6.0 x 1.2 cm.  A second area is 6.0 x 1.4 cm.  Also closer to the nipple, there are two discrete areas of similar appearing calcifications.  IMPRESSION: Suspicious microcalcifications within the right breast. Stereotactic guided core biopsy is recommended.  I would consider sampling of two sites at the time of biopsy.  RECOMMENDATION: Stereotactic guided core biopsy.  This has been scheduled for the patient on 11/20/2012 at 1 o'clock p.m.  I have discussed the findings and recommendations with the patient and her mother. Results were also provided in writing at the conclusion of the visit.  If applicable, a reminder letter will be sent to the patient regarding her next appointment.  BI-RADS CATEGORY 4:  Suspicious abnormality - biopsy should be considered.   Original Report Authenticated By: Norva Pavlov, M.D.   Mm [facility] Mgmt  11/21/2012   *RADIOLOGY REPORT*  ESTABLISHED PATIENT OFFICE VISIT - LEVEL II [phone])  Chief Complaint:  The patient presents for follow-up after stereotactic core needle biopsy of the right breast yesterday.  History:  The patient was callback for abnormal right upper outer quadrant calcifications on screening mammography and further imaging and subsequent stereotactic core needle biopsy were performed.  Exam:  At physical exam, the two biopsy sites in the right upper outer quadrant are clean and dry without ecchymosis or hematoma.  Pathology: Pathology demonstrates intermediate grade DCIS at both locations, which is compatible with the imaging appearance.  This is compatible with multicentric disease.  Assessment and Plan:  The patient is scheduled for multidisciplinary clinic 11/26/2012 and breast MRI  11/26/2012 at 7:15 a.m.   Original Report Authenticated By: Christiana Pellant, M.D.   Mm Rt Breast Bx W Loc Dev 1st Lesion Image Bx Spec Stereo Guide  11/20/2012   *RADIOLOGY REPORT*  Clinical Data:  Suspicious right breast upper outer quadrant and 12 o'clock location calcifications  STEREOTACTIC-GUIDED VACUUM ASSISTED BIOPSY OF THE RIGHT BREAST X 2 AND SPECIMEN RADIOGRAPH  Comparison: Previous exams.  I met with the patient and we discussed the procedure of stereotactic-guided biopsy, including benefits and alternatives. We discussed the high likelihood of a successful procedure. We discussed the risks of the procedure, including infection, bleeding, tissue injury, clip migration, and inadequate sampling. Informed, written consent was given. The usual time-out protocol was performed immediately prior to the procedure.  Using sterile technique and 2% Lidocaine as local anesthetic, under stereotactic guidance, a 9 gauge vacuum-assisted device was used to perform core needle biopsy of calcifications of the upper outer quadrant and subsequently 12 o'clock location using a superior to inferior approach.  Specimen radiograph was performed, showing calcifications within the biopsy  samples at both locations. Specimens with calcifications are identified for pathology.  At the conclusion of the procedure, a T shaped clip was first placed at the upper outer quadrant biopsy site followed by a dumbbell Trimark shaped clip at the 12 o'clock location biopsy site.  Follow-up 2-view mammogram confirmed clip placement at the biopsy sites.  The clips are 2 cm apart and are felt to correspond to the 2 dominant segmental areas of abnormal calcifications in the right upper outer quadrant.  IMPRESSION:  Stereotactic-guided biopsy of right upper outer quadrant and 12 o'clock locations calcifications as above.  Pathology is pending. No apparent complications.   Original Report Authenticated By: Christiana Pellant, M.D.   Mm Rt Breast Bx W Loc  Dev Ea Ad Lesion Img Bx Spec Stereo Guide  11/20/2012   *RADIOLOGY REPORT*  Clinical Data:  Suspicious right breast upper outer quadrant and 12 o'clock location calcifications  STEREOTACTIC-GUIDED VACUUM ASSISTED BIOPSY OF THE RIGHT BREAST X 2 AND SPECIMEN RADIOGRAPH  Comparison: Previous exams.  I met with the patient and we discussed the procedure of stereotactic-guided biopsy, including benefits and alternatives. We discussed the high likelihood of a successful procedure. We discussed the risks of the procedure, including infection, bleeding, tissue injury, clip migration, and inadequate sampling. Informed, written consent was given. The usual time-out protocol was performed immediately prior to the procedure.  Using sterile technique and 2% Lidocaine as local anesthetic, under stereotactic guidance, a 9 gauge vacuum-assisted device was used to perform core needle biopsy of calcifications of the upper outer quadrant and subsequently 12 o'clock location using a superior to inferior approach.  Specimen radiograph was performed, showing calcifications within the biopsy samples at both locations. Specimens with calcifications are identified for pathology.  At the conclusion of the procedure, a T shaped clip was first placed at the upper outer quadrant biopsy site followed by a dumbbell Trimark shaped clip at the 12 o'clock location biopsy site.  Follow-up 2-view mammogram confirmed clip placement at the biopsy sites.  The clips are 2 cm apart and are felt to correspond to the 2 dominant segmental areas of abnormal calcifications in the right upper outer quadrant.  IMPRESSION:  Stereotactic-guided biopsy of right upper outer quadrant and 12 o'clock locations calcifications as above.  Pathology is pending. No apparent complications.   Original Report Authenticated By: Christiana Pellant, M.D.     LABS:    Chemistry      Component Value Date/Time   NA 138 11/26/2012 1225   NA 135 07/02/2012 1218   K 3.6 11/26/2012  1225   K 3.9 07/02/2012 1218   CL 103 07/02/2012 1218   CO2 28 11/26/2012 1225   CO2 22 07/02/2012 1218   BUN 11.5 11/26/2012 1225   BUN 16 07/02/2012 1218   CREATININE 0.8 11/26/2012 1225   CREATININE 0.66 07/02/2012 1218      Component Value Date/Time   CALCIUM 9.8 11/26/2012 1225   CALCIUM 9.3 07/02/2012 1218   ALKPHOS 32* 11/26/2012 1225   ALKPHOS 31* 12/21/2009 1015   AST 13 11/26/2012 1225   AST 15 12/21/2009 1015   ALT 14 11/26/2012 1225   ALT 15 12/21/2009 1015   BILITOT 0.27 11/26/2012 1225   BILITOT 0.7 12/21/2009 1015      Lab Results  Component Value Date   WBC 5.8 11/26/2012   HGB 9.9* 11/26/2012   HCT 31.0* 11/26/2012   MCV 70.2* 11/26/2012   PLT 332 11/26/2012   PATHOLOGY: ADDITIONAL INFORMATION: 1. PROGNOSTIC INDICATORS -  ACIS Results: IMMUNOHISTOCHEMICAL AND MORPHOMETRIC ANALYSIS BY THE AUTOMATED CELLULAR IMAGING SYSTEM (ACIS) Estrogen Receptor: 100%, POSITIVE, STRONG STAINING INTENSITY Progesterone Receptor: 100%, POSITIVE, STRONG STAINING INTENSITY REFERENCE RANGE ESTROGEN RECEPTOR NEGATIVE <1% POSITIVE =>1% PROGESTERONE RECEPTOR NEGATIVE <1% POSITIVE =>1% All controls stained appropriately Pecola Leisure MD Pathologist, Electronic Signature ( Signed 11/26/2012) FINAL DIAGNOSIS Diagnosis 1. Breast, right, needle core biopsy, UOQ - DUCTAL CARCINOMA IN SITU WITH CALCIFICATIONS. - SEE COMMENT. 2. Breast, right, needle core biopsy, 12 o'clock - DUCTAL CARCINOMA IN SITU WITH CALCIFICATIONS. - SEE COMMENT. 1 of 2 FINAL for Sheryl, Cox ([id number]) Microscopic Comment 1. ,2. The carcinoma in the two specimens is morphologically similar. Estrogen receptor and progesterone receptor studies will be performed on part 1 and the results reported separately. The results were called to the [facility] on 11/21/2012. (JBK:kh 11/21/2012). Pecola Leisure MD Pathologist, Electronic Signature (Case signed 11/21/2012) Specimen Gross and Clinical  Information ASSESSMENT    42 year old female with  #1 new diagnosis of ductal carcinoma in situ of the right breast measuring 6 cm. The patient is not a good candidate for breast conservation and therefore a mastectomy with sentinel lymph node biopsy is recommended.  #2 patient certainly will need plastics consultation and she will be referred by Dr. Harriette Bouillon.  #3 patient also will need genetic counseling and testing and she will be referred.  #4 patient and I discussed adjuvant therapy with antiestrogen therapy. Since she is premenopausal she would receive tamoxifen for future breast cancer is prevention in the contralateral breast.  Clinical Trial Eligibility: no Multidisciplinary conference discussion yes     PLAN:    #1 proceed with mastectomy and sentinel lymph node biopsy.  #2 genetic counseling and testing.  #3 because there will be a delay in results in the genetic testing I will begin the patient on tamoxifen 20 mg daily risks and benefits and side effects of tamoxifen were discussed with her. She will discontinue this prior to her surgery appointments and dates.  #4 see the patient back in a few weeks time        Discussion: Patient is being treated per NCCN breast cancer care guidelines appropriate for stage.0   Thank you so much for allowing me to participate in the care of Sheryl Cox. I will continue to follow up the patient with you and assist in her care.  All questions were answered. The patient knows to call the clinic with any problems, questions or concerns. We can certainly see the patient much sooner if necessary.  I spent 55 minutes counseling the patient face to face. The total time spent in the appointment was 60 minutes.  Drue Second, MD Medical/Oncology [facility] [phone] (beeper) [phone] (Office)  11/26/2012, 2:11 PM

## 2012-11-26 NOTE — Progress Notes (Signed)
Checked in new pt with no financial concerns. °

## 2012-11-26 NOTE — Progress Notes (Signed)
Patient ID: Sheryl Cox, female   DOB: [birth year], 42 y.o.   MRN: [id number]  No chief complaint on file.   HPI Sheryl Cox is a 42 y.o. female.  PT SENT AT REQUEST OF DR GREEN FOR TWO 6 CM REGION OF DCIS.  NO SYMPTOMS.  DENIES MASS.  NO DISCHARGE HPI  Past Medical History  Diagnosis Date  . Hypertension   . Iron deficiency   . Bronchiectasis   . Urinary tract infection   . Fibroid   . Chlamydia   . Anemia     Past Surgical History  Procedure Laterality Date  . Cesarean section      Family History  Problem Relation Age of Onset  . Anesthesia problems Neg Hx   . Hypertension Mother   . Diabetes Mother   . Cancer Paternal Grandmother     breast  . Cancer Paternal Grandfather   . Prostate cancer Father   . Prostate cancer Maternal Uncle     Social History History  Substance Use Topics  . Smoking status: Never Smoker   . Smokeless tobacco: Never Used  . Alcohol Use: Yes     Comment: occa    Allergies  Allergen Reactions  . Shrimp [Shellfish Allergy] Anaphylaxis    Current Outpatient Prescriptions  Medication Sig Dispense Refill  . ferrous sulfate 325 (65 FE) MG tablet Take 1 tablet (325 mg total) by mouth daily.  30 tablet  1  . Multiple Vitamins-Minerals (MULTIVITAMIN PO) Take 1 tablet by mouth daily.      . Olmesartan-Amlodipine-HCTZ (TRIBENZOR) 20-5-12.5 MG TABS Take 1 tablet by mouth every morning.  30 tablet  1  . tamoxifen (NOLVADEX) 20 MG tablet Take 1 tablet (20 mg total) by mouth daily.  90 tablet  12   No current facility-administered medications for this visit.    Review of Systems Review of Systems  Constitutional: Negative for fever, chills and unexpected weight change.  HENT: Negative for hearing loss, congestion, sore throat, trouble swallowing and voice change.   Eyes: Negative for visual disturbance.  Respiratory: Negative for cough and wheezing.   Cardiovascular: Negative for chest pain, palpitations and leg swelling.   Gastrointestinal: Negative for nausea, vomiting, abdominal pain, diarrhea, constipation, blood in stool, abdominal distention and anal bleeding.  Genitourinary: Negative for hematuria, vaginal bleeding and difficulty urinating.  Musculoskeletal: Negative for arthralgias.  Skin: Negative for rash and wound.  Neurological: Negative for seizures, syncope and headaches.  Hematological: Negative for adenopathy. Does not bruise/bleed easily.  Psychiatric/Behavioral: Negative for confusion.    Last menstrual period 10/28/2012.  Physical Exam Physical Exam  Constitutional: She is oriented to person, place, and time. She appears well-developed and well-nourished.  HENT:  Head: Normocephalic.  Eyes: EOM are normal. Pupils are equal, round, and reactive to light.  Neck: Normal range of motion. Neck supple.  Cardiovascular: Normal rate and regular rhythm.   Pulmonary/Chest: Effort normal and breath sounds normal. Right breast exhibits no inverted nipple, no mass, no nipple discharge, no skin change and no tenderness. Left breast exhibits no inverted nipple, no mass, no nipple discharge, no skin change and no tenderness. Breasts are symmetrical.  Musculoskeletal: Normal range of motion.  Neurological: She is alert and oriented to person, place, and time.  Skin: Skin is warm and dry.  Psychiatric: She has a normal mood and affect. Her behavior is normal. Judgment and thought content normal.    Data Reviewed Clinical Data: Patient with new diagnosis of DCIS in the  right  breast.  MRI BILATERAL BREAST WITHOUT AND WITH CONTRAST  Technique: Multiplanar, multisequence MR images of the both breasts  were obtained prior to and following the intravenous administration  of 20ml of Multihance.  Labs: BUN and creatinine were obtained on site at [facility] at [address] Creatinine is 0.9, BUN is 12, and  GFR is 83.  Comparison: Recent imaging examinations.  FINDINGS:  Breast  composition: b: There are scattered areas of  fibroglandular density.  Background parenchymal enhancement: Moderate  Left breast: In the upper inner quadrant of the posterior left  breast, there is an 8 x 7 x 7 mm circumscribed enhancing mass  (image 93, series 6). The mass is hyperintense on T2 images. There  is associated plateau kinetics. While this may represent a lymph  node, no fatty hilum can definitely be identified. At approximately  3 o'clock in the left breast (middle third) there is an enhancing  T2 hyperintense mass with a fatty hilum, consistent with a lymph  node. This lymph node can also be seen on the 2013 screening  mammogram. No other areas of abnormal enhancement.  Right breast: At 12 o'clock in the right breast (middle third)  there are two adjacent biopsy site cavities. The more lateral  biopsy site measures 0.9 x 1.4 x 2.8 cm and the more medial site  measures 2 x 1 x 1.1 cm. There is no definite surrounding  enhancement to correlate with the large area of calcifications seen  mammographically.  Lymph nodes: Prominent axillary lymph nodes are seen bilaterally  without abnormal morphology.  Ancillary findings: None.  IMPRESSION:  1. At 12 o'clock in the right breast, two biopsy site cavities are  identified. No non mass enhancement is identified to correlate with  the large area of calcifications seen mammographically. Therefore,  MRI is likely underestimating the extent of disease.  2. Enhancing left breast mass measuring up to 8 mm, which is T2  hyperintense and may represent a lymph node.  RECOMMENDATION:  Left breast ultrasound to assess for MRI detected mass. MRI guided  biopsy should be considered if ultrasound is negative.  BI-RADS CATEGORY 0: Incomplete. Need additional imaging  evaluation and/or prior mammograms for comparison.  THREE-DIMENSIONAL MR IMAGE RENDERING ON INDEPENDENT WORKSTATION:  Three-dimensional MR images were rendered by  post-processing of the  original MR data on a DynaCad workstation. The three-dimensional  MR images were interpreted, and findings were reported in the  accompanying complete MRI report for this study.  Original Report Authenticated By: Jerene Dilling, M.D.   Assessment    RIGHT BREAST DCIS 12 CM TOTAL AREA OF CLACIFICATIONS    Plan    NEEDS MASTECTOMY AND GENETICS.   PLASTIC REFERRAL.  RETURN TO SEE ME IN 3 WEEKS.         Jaylee Freeze A. 11/26/2012, 3:39 PM

## 2012-11-26 NOTE — Telephone Encounter (Signed)
Lm gv appt for 01/07/13 @ 12 noon. Made pt aware that i will mail a letter/avs...td

## 2012-11-27 ENCOUNTER — Other Ambulatory Visit (INDEPENDENT_AMBULATORY_CARE_PROVIDER_SITE_OTHER): Payer: Self-pay | Admitting: Surgery

## 2012-11-27 DIAGNOSIS — R928 Other abnormal and inconclusive findings on diagnostic imaging of breast: Secondary | ICD-10-CM

## 2012-11-28 ENCOUNTER — Ambulatory Visit (HOSPITAL_BASED_OUTPATIENT_CLINIC_OR_DEPARTMENT_OTHER): Payer: BC Managed Care – PPO | Admitting: Genetic Counselor

## 2012-11-28 ENCOUNTER — Other Ambulatory Visit: Payer: BC Managed Care – PPO | Admitting: Lab

## 2012-11-28 ENCOUNTER — Encounter: Payer: Self-pay | Admitting: Genetic Counselor

## 2012-11-28 DIAGNOSIS — Z803 Family history of malignant neoplasm of breast: Secondary | ICD-10-CM

## 2012-11-28 DIAGNOSIS — Z808 Family history of malignant neoplasm of other organs or systems: Secondary | ICD-10-CM

## 2012-11-28 DIAGNOSIS — Z8042 Family history of malignant neoplasm of prostate: Secondary | ICD-10-CM

## 2012-11-28 DIAGNOSIS — C50411 Malignant neoplasm of upper-outer quadrant of right female breast: Secondary | ICD-10-CM

## 2012-11-28 DIAGNOSIS — C50419 Malignant neoplasm of upper-outer quadrant of unspecified female breast: Secondary | ICD-10-CM

## 2012-11-28 NOTE — Progress Notes (Signed)
Dr.  Drue Second requested a consultation for genetic counseling and risk assessment for Sheryl Cox, a 42 y.o. female, for discussion of her personal history of breast cancer and family history of breast and prostate cancer.  She presents to clinic today to discuss the possibility of a genetic predisposition to cancer, and to further clarify her risks, as well as her family members' risks for cancer.   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 This will be treated with mastectomy. The tumor is ER+/PR+.     Past Medical History  Diagnosis Date  . Hypertension   . Iron deficiency   . Bronchiectasis   . Urinary tract infection   . Fibroid   . Chlamydia   . Anemia   . 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 Cesarean section      History   Social History  . Marital Status: Single    Spouse Name: N/A    Number of Children: N/A  . Years of Education: N/A   Social History Main Topics  . Smoking status: Never Smoker   . Smokeless tobacco: Never Used  . Alcohol Use: Yes     Comment: occa  . Drug Use: No  . Sexual Activity: Not Currently    Birth Control/ Protection: Condom     Comment: last intercourse > 1 year.   Other Topics Concern  . None   Social History Narrative  . None    REPRODUCTIVE HISTORY AND PERSONAL RISK ASSESSMENT FACTORS: Menarche was at age 28.   premenopausal Uterus Intact: yes Ovaries Intact: yes G1P1A0, first live birth at age 60  She has not previously undergone treatment for infertility.   Oral Contraceptive use: 5 years   She has not used HRT in the past.    FAMILY HISTORY:  We obtained a detailed, 4-generation family history.  Significant diagnoses are listed below: Family History  Problem Relation Age of Onset  . Anesthesia problems Neg Hx   . Hypertension Mother   . Diabetes Mother   .  Breast cancer Paternal Grandmother     dx in her 76s  . Prostate cancer Paternal Grandfather   . Prostate cancer Father 36  . Prostate cancer Paternal Uncle   . Breast cancer Other     paternal grandmother's sister    Patient's ancestors are of African American descent. There is no reported Ashkenazi Jewish ancestry. There is no known consanguinity.  GENETIC COUNSELING ASSESSMENT: [name] is a 42 y.o. female with a personal history of breast cancer and family history of breast and prostate cancer which somewhat suggestive of a hereditary cancer syndrome and predisposition to cancer. We, therefore, discussed and recommended the following at today's visit.   DISCUSSION: We reviewed the characteristics, features and inheritance patterns of hereditary cancer syndromes. We also discussed genetic testing, including the appropriate family members to test, the process of testing, insurance coverage and turn-around-time for results. We discussed that BRCA mutations make up about 50% of hereditary breast cancer.  While it identifies much of a risk for hereditary breast cancer, about 30% of cases are due to changes in other genes and an additional 20% are the result of genes that we are not aware of.  Based on her personal and family history of breast and prostate cancer we discussed  BRCA testing as well as panel testing.  The patient decided to do BRCA testing and depending on the results may decide to reflex to a larger panel test.  PLAN: After considering the risks, benefits, and limitations, [name] provided informed consent to pursue genetic testing and the blood sample will be sent to [facility] for analysis of the BRCA genes. We discussed the implications of a positive, negative and/ or variant of uncertain significance genetic test result. Results should be available within approximately 10 days - 2 weeks' time, at which point they will be disclosed by telephone to Bonnita Levan, as will any additional recommendations warranted by these results. ARLICIA PAQUETTE will receive a summary of her genetic counseling visit and a copy of her results once available. This information will also be available in Epic. We encouraged [name] to remain in contact with cancer genetics annually so that we can continuously update the family history and inform her of any changes in cancer genetics and testing that may be of benefit for her family. Lubertha Basque Allegretto's questions were answered to her satisfaction today. Our contact information was provided should additional questions or concerns arise.  The patient was seen for a total of 60 minutes, greater than 50% of which was spent face-to-face counseling.  This plan is being carried out per Dr. Feliz Beam recommendations.  This note will also be sent to the referring provider via the electronic medical record. The patient will be supplied with a summary of this genetic counseling discussion as well as educational information on the discussed hereditary cancer syndromes following the conclusion of their visit.   Patient was discussed with Dr. Drue Second.   _______________________________________________________________________ For Office Staff:  Number of people involved in session: 1 Was an Intern/ student involved with case: no

## 2012-11-30 NOTE — Progress Notes (Signed)
Radiation Oncology         [phone] ________________________________  Name: Sheryl Cox MRN: [id number]  Date: 11/26/2012  DOB: 1970-07-12  WU:JWJXB,JYNWG J, MD  Cornett, Clovis Pu., MD   Drue Second, M.D.  REFERRING PHYSICIAN: Cornett, Clovis Pu., MD   DIAGNOSIS: The encounter diagnosis was Breast cancer of upper-outer quadrant of right female breast.   HISTORY OF PRESENT ILLNESS::Sheryl Cox is a 42 y.o. female who is seen for an initial consultation visit. The patient was found to have suspicious calcifications on recent screening mammogram. These represented to large areas of calcification approximately 6 cm each. The patient therefore proceeded to undergo stereotactic biopsy of both of these areas. Pathology has returned positive for ductal carcinoma in situ with calcifications were both areas. Receptor studies have indicated that both tumors are ER positive and PR positive. An MRI scan is pending.  The patient's case was discussed in multidisciplinary breast conference. She is not felt to be a good surgical candidate and therefore Dr. Luisa Hart is discussing receiving with a mastectomy with her today in multidisciplinary breast clinic.   PREVIOUS RADIATION THERAPY: No   PAST MEDICAL HISTORY:  has a past medical history of Hypertension; Iron deficiency; Bronchiectasis; Urinary tract infection; Fibroid; Chlamydia; Anemia; and Breast cancer (11/2012).     PAST SURGICAL HISTORY: Past Surgical History  Procedure Laterality Date  . Cesarean section       FAMILY HISTORY: family history includes Breast cancer in her other and paternal grandmother; Diabetes in her mother; Hypertension in her mother; Prostate cancer in her paternal grandfather and paternal uncle; Prostate cancer (age of onset: 25) in her father. There is no history of Anesthesia problems.   SOCIAL HISTORY:  reports that she has never smoked. She has never used smokeless tobacco. She reports that  drinks  alcohol. She reports that she does not use illicit drugs.   ALLERGIES: Shrimp   MEDICATIONS:  Current Outpatient Prescriptions  Medication Sig Dispense Refill  . ferrous sulfate 325 (65 FE) MG tablet Take 1 tablet (325 mg total) by mouth daily.  30 tablet  1  . Multiple Vitamins-Minerals (MULTIVITAMIN PO) Take 1 tablet by mouth daily.      . Olmesartan-Amlodipine-HCTZ (TRIBENZOR) 20-5-12.5 MG TABS Take 1 tablet by mouth every morning.  30 tablet  1  . tamoxifen (NOLVADEX) 20 MG tablet Take 1 tablet (20 mg total) by mouth daily.  90 tablet  12   No current facility-administered medications for this encounter.     REVIEW OF SYSTEMS:  A 15 point review of systems is documented in the electronic medical record. This was obtained by the nursing staff. However, I reviewed this with the patient to discuss relevant findings and make appropriate changes.  Pertinent items are noted in HPI.    PHYSICAL EXAM:  vitals were not taken for this visit.  General: Well-developed, in no acute distress HEENT: Normocephalic, atraumatic; oral cavity clear Neck: Supple without any lymphadenopathy Cardiovascular: Regular rate and rhythm Respiratory: Clear to auscultation bilaterally Breasts: No skin involvement or nipple change on exam. No discrete mass palpated on my exam with no axillary adenopathy. Unremarkable exam on the left and left axilla GI: Soft, nontender, normal bowel sounds Extremities: No edema present Neuro: No focal deficits     LABORATORY DATA:  Lab Results  Component Value Date   WBC 5.8 11/26/2012   HGB 9.9* 11/26/2012   HCT 31.0* 11/26/2012   MCV 70.2* 11/26/2012   PLT 332 11/26/2012  Lab Results  Component Value Date   NA 138 11/26/2012   K 3.6 11/26/2012   CL 103 07/02/2012   CO2 28 11/26/2012   Lab Results  Component Value Date   ALT 14 11/26/2012   AST 13 11/26/2012   ALKPHOS 32* 11/26/2012   BILITOT 0.27 11/26/2012      RADIOGRAPHY: Mr Breast Bilateral W Wo  Contrast  11/26/2012   *RADIOLOGY REPORT*  Clinical Data:  Patient with new diagnosis of DCIS in the right breast.  MRI BILATERAL BREAST WITHOUT AND WITH CONTRAST  Technique: Multiplanar, multisequence MR images of the both breasts were obtained prior to and following the intravenous administration of 20ml of Multihance.  Labs: BUN and creatinine were obtained on site at [facility] at [address] Creatinine is 0.9, BUN is 12, and GFR is 83.  Comparison:  Recent imaging examinations.  FINDINGS:  Breast composition:  b:  There are scattered areas of fibroglandular density.  Background parenchymal enhancement:  Moderate  Left breast:  In the upper inner quadrant of the posterior left breast, there is an 8 x 7 x 7 mm circumscribed enhancing mass (image 93, series 6). The mass is hyperintense on T2 images. There is associated plateau kinetics. While this may represent a lymph node, no fatty hilum can definitely be identified. At approximately 3 o'clock in the left breast (middle third) there is an enhancing T2 hyperintense mass with a fatty hilum, consistent with a lymph node.  This lymph node can also be seen on the 2013 screening mammogram. No other areas of abnormal enhancement.  Right breast:  At 12 o'clock in the right breast (middle third) there are two adjacent biopsy site cavities.  The more lateral biopsy site measures 0.9 x 1.4 x 2.8 cm and the more medial site measures 2 x 1 x 1.1 cm. There is no definite surrounding enhancement to correlate with the large area of calcifications seen mammographically.  Lymph nodes:  Prominent axillary lymph nodes are seen bilaterally without abnormal morphology.  Ancillary findings:  None.  IMPRESSION: 1. At 12 o'clock in the right breast, two biopsy site cavities are identified. No non mass enhancement is identified to correlate with the large area of calcifications seen mammographically.  Therefore, MRI is likely underestimating the extent of disease.  2.   Enhancing left breast mass measuring up to 8 mm, which is T2 hyperintense and may represent a lymph node.  RECOMMENDATION:  Left breast ultrasound to assess for MRI detected mass. MRI guided biopsy should be considered if ultrasound is negative.  BI-RADS CATEGORY 0:  Incomplete.  Need additional imaging evaluation and/or prior mammograms for comparison.  THREE-DIMENSIONAL MR IMAGE RENDERING ON INDEPENDENT WORKSTATION:  Three-dimensional MR images were rendered by post-processing of the original MR data on a DynaCad workstation.  The three-dimensional MR images were interpreted, and findings were reported in the accompanying complete MRI report for this study.   Original Report Authenticated By: Jerene Dilling, M.D.   [facility] [employer] R  11/06/2012   *RADIOLOGY REPORT*  Clinical Data:  The patient returns for evaluation of right breast calcifications.  DIGITAL DIAGNOSTIC RIGHT MAMMOGRAM  Comparison: 10/23/2012 and earlier  Findings:  ACR Breast Density Category b:  There are scattered areas of fibroglandular density.  Magnified views are performed of calcifications in the lateral portion of the right breast.  Numerous calcifications appear both linear in morphology and linear in distribution and extend over a large portion of the breast.  The  longest area is 6.0 x 1.2 cm.  A second area is 6.0 x 1.4 cm.  Also closer to the nipple, there are two discrete areas of similar appearing calcifications.  IMPRESSION: Suspicious microcalcifications within the right breast. Stereotactic guided core biopsy is recommended.  I would consider sampling of two sites at the time of biopsy.  RECOMMENDATION: Stereotactic guided core biopsy.  This has been scheduled for the patient on 11/20/2012 at 1 o'clock p.m.  I have discussed the findings and recommendations with the patient and her mother. Results were also provided in writing at the conclusion of the visit.  If applicable, a reminder letter will be sent to the patient  regarding her next appointment.  BI-RADS CATEGORY 4:  Suspicious abnormality - biopsy should be considered.   Original Report Authenticated By: Norva Pavlov, M.D.   [facility]  11/21/2012   *RADIOLOGY REPORT*  ESTABLISHED PATIENT OFFICE VISIT - LEVEL II [phone])  Chief Complaint:  The patient presents for follow-up after stereotactic core needle biopsy of the right breast yesterday.  History:  The patient was callback for abnormal right upper outer quadrant calcifications on screening mammography and further imaging and subsequent stereotactic core needle biopsy were performed.  Exam:  At physical exam, the two biopsy sites in the right upper outer quadrant are clean and dry without ecchymosis or hematoma.  Pathology: Pathology demonstrates intermediate grade DCIS at both locations, which is compatible with the imaging appearance.  This is compatible with multicentric disease.  Assessment and Plan:  The patient is scheduled for multidisciplinary clinic 11/26/2012 and breast MRI 11/26/2012 at 7:15 a.m.   Original Report Authenticated By: Christiana Pellant, M.D.   Mm Rt Breast Bx W Loc Dev 1st Lesion Image Bx Spec Stereo Guide  11/20/2012   *RADIOLOGY REPORT*  Clinical Data:  Suspicious right breast upper outer quadrant and 12 o'clock location calcifications  STEREOTACTIC-GUIDED VACUUM ASSISTED BIOPSY OF THE RIGHT BREAST X 2 AND SPECIMEN RADIOGRAPH  Comparison: Previous exams.  I met with the patient and we discussed the procedure of stereotactic-guided biopsy, including benefits and alternatives. We discussed the high likelihood of a successful procedure. We discussed the risks of the procedure, including infection, bleeding, tissue injury, clip migration, and inadequate sampling. Informed, written consent was given. The usual time-out protocol was performed immediately prior to the procedure.  Using sterile technique and 2% Lidocaine as local anesthetic, under stereotactic guidance, a 9 gauge  vacuum-assisted device was used to perform core needle biopsy of calcifications of the upper outer quadrant and subsequently 12 o'clock location using a superior to inferior approach.  Specimen radiograph was performed, showing calcifications within the biopsy samples at both locations. Specimens with calcifications are identified for pathology.  At the conclusion of the procedure, a T shaped clip was first placed at the upper outer quadrant biopsy site followed by a dumbbell Trimark shaped clip at the 12 o'clock location biopsy site.  Follow-up 2-view mammogram confirmed clip placement at the biopsy sites.  The clips are 2 cm apart and are felt to correspond to the 2 dominant segmental areas of abnormal calcifications in the right upper outer quadrant.  IMPRESSION:  Stereotactic-guided biopsy of right upper outer quadrant and 12 o'clock locations calcifications as above.  Pathology is pending. No apparent complications.   Original Report Authenticated By: Christiana Pellant, M.D.   Mm Rt Breast Bx W Loc Dev Ea Ad Lesion Img Bx Spec Stereo Guide  11/20/2012   *RADIOLOGY REPORT*  Clinical Data:  Suspicious right breast upper outer quadrant and 12 o'clock location calcifications  STEREOTACTIC-GUIDED VACUUM ASSISTED BIOPSY OF THE RIGHT BREAST X 2 AND SPECIMEN RADIOGRAPH  Comparison: Previous exams.  I met with the patient and we discussed the procedure of stereotactic-guided biopsy, including benefits and alternatives. We discussed the high likelihood of a successful procedure. We discussed the risks of the procedure, including infection, bleeding, tissue injury, clip migration, and inadequate sampling. Informed, written consent was given. The usual time-out protocol was performed immediately prior to the procedure.  Using sterile technique and 2% Lidocaine as local anesthetic, under stereotactic guidance, a 9 gauge vacuum-assisted device was used to perform core needle biopsy of calcifications of the upper outer  quadrant and subsequently 12 o'clock location using a superior to inferior approach.  Specimen radiograph was performed, showing calcifications within the biopsy samples at both locations. Specimens with calcifications are identified for pathology.  At the conclusion of the procedure, a T shaped clip was first placed at the upper outer quadrant biopsy site followed by a dumbbell Trimark shaped clip at the 12 o'clock location biopsy site.  Follow-up 2-view mammogram confirmed clip placement at the biopsy sites.  The clips are 2 cm apart and are felt to correspond to the 2 dominant segmental areas of abnormal calcifications in the right upper outer quadrant.  IMPRESSION:  Stereotactic-guided biopsy of right upper outer quadrant and 12 o'clock locations calcifications as above.  Pathology is pending. No apparent complications.   Original Report Authenticated By: Christiana Pellant, M.D.       IMPRESSION: The patient has a recent diagnosis of DCIS of the right breast. She has 2 large areas of likely involvement based on calcifications seen on mammography. She is not a good candidate for breast conservation treatment. We therefore have discussed with her today proceeding with a mastectomy and sentinel lymph node biopsy given the size of the masses. She also is going to see the genetics and plastic surgery.  I do not anticipate any role for radiation treatment in her case. I discussed this with her. We discussed briefly how radiation is used in certain circumstances in the setting of breast cancer. She understands that her recommendations today her based on the currently available information, and that this could change depending on new information as she goes forward with her treatment.   PLAN: I have not scheduled any definite followup with the patient. I do not anticipate any need for radiotherapy but would be happy to see the patient at any point in the future if I can be of further assistance.    I spent 60  minutes minutes face to face with the patient and more than 50% of that time was spent in counseling and/or coordination of care.    ________________________________   Radene Gunning, MD, PhD

## 2012-12-01 ENCOUNTER — Ambulatory Visit
Admission: RE | Admit: 2012-12-01 | Discharge: 2012-12-01 | Disposition: A | Discharge status: Home / Self Care | Payer: BC Managed Care – PPO | Source: Ambulatory Visit | Attending: Surgery | Admitting: Surgery

## 2012-12-01 DIAGNOSIS — R928 Other abnormal and inconclusive findings on diagnostic imaging of breast: Secondary | ICD-10-CM

## 2012-12-02 ENCOUNTER — Other Ambulatory Visit (INDEPENDENT_AMBULATORY_CARE_PROVIDER_SITE_OTHER): Payer: Self-pay | Admitting: Surgery

## 2012-12-02 DIAGNOSIS — R928 Other abnormal and inconclusive findings on diagnostic imaging of breast: Secondary | ICD-10-CM

## 2012-12-04 ENCOUNTER — Telehealth: Payer: Self-pay | Admitting: Oncology

## 2012-12-04 NOTE — Telephone Encounter (Signed)
FAXED PT MEDICAL RECORDS TO [facility]

## 2012-12-05 ENCOUNTER — Ambulatory Visit
Admission: RE | Admit: 2012-12-05 | Discharge: 2012-12-05 | Disposition: A | Discharge status: Home / Self Care | Payer: Medicaid Other | Source: Ambulatory Visit | Attending: Surgery | Admitting: Surgery

## 2012-12-05 DIAGNOSIS — R928 Other abnormal and inconclusive findings on diagnostic imaging of breast: Secondary | ICD-10-CM

## 2012-12-05 HISTORY — PX: BIOPSY BREAST: PRO8

## 2012-12-05 MED ORDER — GADOBENATE DIMEGLUMINE 529 MG/ML IV SOLN
20.0000 mL | Freq: Once | INTRAVENOUS | Status: AC | PRN
  Administered 2012-12-05: 20 mL via INTRAVENOUS

## 2012-12-08 ENCOUNTER — Telehealth: Payer: Self-pay | Admitting: Genetic Counselor

## 2012-12-08 NOTE — Telephone Encounter (Signed)
Revealed negative BRCA testing.  Discussed whether she wants to reflex to a larger panel.  She will call me back, as she is picking up her daughter from daycare.

## 2012-12-09 ENCOUNTER — Telehealth: Payer: Self-pay | Admitting: *Deleted

## 2012-12-09 ENCOUNTER — Encounter: Payer: Self-pay | Admitting: Genetic Counselor

## 2012-12-09 NOTE — Telephone Encounter (Signed)
Left vm for pt to return call to discuss [facility] from 11/26/12.

## 2012-12-10 ENCOUNTER — Ambulatory Visit: Payer: Self-pay

## 2012-12-10 DIAGNOSIS — I10 Essential (primary) hypertension: Secondary | ICD-10-CM | POA: Insufficient documentation

## 2012-12-12 ENCOUNTER — Telehealth (INDEPENDENT_AMBULATORY_CARE_PROVIDER_SITE_OTHER): Payer: Self-pay

## 2012-12-12 NOTE — Telephone Encounter (Signed)
LMOM> wanted to discuss on getting her scheduled for surgery.

## 2012-12-29 ENCOUNTER — Encounter: Payer: Self-pay | Admitting: *Deleted

## 2012-12-29 NOTE — Progress Notes (Signed)
Mailed after appt letter to pt. 

## 2012-12-30 DIAGNOSIS — N920 Excessive and frequent menstruation with regular cycle: Secondary | ICD-10-CM | POA: Insufficient documentation

## 2013-01-02 ENCOUNTER — Telehealth: Payer: Self-pay | Admitting: *Deleted

## 2013-01-02 NOTE — Telephone Encounter (Signed)
Left vm for pt to return call regarding calling [facility] to get surgery scheduled and to r/s f/u with Dr. Welton Flakes.

## 2013-01-07 ENCOUNTER — Ambulatory Visit: Payer: Self-pay | Admitting: Oncology

## 2013-01-22 ENCOUNTER — Other Ambulatory Visit: Payer: Self-pay

## 2013-01-28 ENCOUNTER — Encounter: Payer: Self-pay | Admitting: Radiation Oncology

## 2013-01-28 NOTE — Progress Notes (Signed)
Location of Breast Cancer:Right upper-outer breast mass measuring 6 cm  Histology per Pathology Report:11/20/2012 Diagnosis 1. Breast, right, needle core biopsy, UOQ - DUCTAL CARCINOMA IN SITU WITH CALCIFICATIONS. - SEE COMMENT. 2. Breast, right, needle core biopsy, 12 o'clock - DUCTAL CARCINOMA IN SITU WITH CALCIFICATIONS. - SEE COMMENT. 1 of  Receptor Status: ER(+), PR (+), Her2-neu  Diagnosis made on screening mammography which revealed suspicious calcifications.Stereotactic biopsy performed  Past/Anticipated interventions by surgeon, if any:{Right breast lumpectomy followed by oncoplastic breast reduction on 01/08/13.  Past/Anticipated interventions by medical oncology, if any: Chemotherapy:Genetic counseling.Start tamoxifen after radiation.  Lymphedema issues,:No  Pain issues, if ZOX:WRUEAV soreness with increased pain on movement.  SAFETY ISSUES:  Prior radiation? No  Pacemaker/ICD? no  Possible current pregnancy?no  Is the patient on methotrexate? no  Current Complaints / other details:menarche age 28, premenopausal, no HRT. First live birth age 15.One daughter age 73.    Tessa Lerner, RN 01/28/2013,4:00 PM

## 2013-01-29 ENCOUNTER — Encounter: Payer: Self-pay | Admitting: Radiation Oncology

## 2013-01-29 ENCOUNTER — Ambulatory Visit
Admission: RE | Admit: 2013-01-29 | Discharge: 2013-01-29 | Disposition: A | Discharge status: Home / Self Care | Payer: BC Managed Care – PPO | Source: Ambulatory Visit | Attending: Radiation Oncology | Admitting: Radiation Oncology

## 2013-01-29 VITALS — BP 132/80 | HR 104 | Temp 99.0°F | Wt 196.6 lb

## 2013-01-29 DIAGNOSIS — C50411 Malignant neoplasm of upper-outer quadrant of right female breast: Secondary | ICD-10-CM

## 2013-01-29 DIAGNOSIS — D0590 Unspecified type of carcinoma in situ of unspecified breast: Secondary | ICD-10-CM | POA: Insufficient documentation

## 2013-01-29 DIAGNOSIS — Z170 Estrogen receptor positive status [ER+]: Secondary | ICD-10-CM | POA: Insufficient documentation

## 2013-01-29 NOTE — Addendum Note (Signed)
Encounter addended by: Tessa Lerner, RN on: 01/29/2013  4:52 PM<BR>     Documentation filed: Charges VN

## 2013-01-29 NOTE — Progress Notes (Signed)
Please see the Nurse Progress Note in the MD Initial Consult Encounter for this patient. 

## 2013-01-29 NOTE — Progress Notes (Signed)
   ACCESSION NUMBER: [id number] RECEIVED: 01/08/2013 ORDERING PHYSICIAN: Lovena Neighbours , MD PATIENT NAME: Sheryl Cox, Sheryl Cox SURGICAL PATHOLOGY REPORT  FINAL PATHOLOGIC DIAGNOSIS  MICROSCOPIC EXAMINATION AND DIAGNOSIS  A. BREAST, RIGHT, LUMPECTOMY: Microinvasive ductal carcinoma, Nottingham grade 1; invasive neoplasm measures less than 0.1 cm in maximum dimension. Invasive neoplasm arises in a background of multifocal ductal carcinoma in situ, intermediate grade, solid, cribriform, and micropapillary architectures, with central luminal necrosis. Margins of resection free of neoplasia. No lymphovascular space invasion. Microcalcifications present in DCIS. Fibrocystic changes. Organizing previous biopsy site changes.   B. BREAST, RIGHT "DEEP MARGIN", EXCIOSION: Ductal carcinoma in situ, focal, intermediate grade, solid architecture; neoplasm extends to less than 0.1 cm from the inked resection margin.  C. BREAST, LEFT, REDUCTION MAMMOPLASTY: Fibrocystic change.  D. BREAST, RIGHT, REDUCTION MAMMOPLASTY: Fibrocystic change.   INVASIVE CARCINOMA OF THE BREAST - COMPLETE EXCISION AND MASTECTOMY - CANCER PROTOCOL  SPECIMEN: Partial breast excision PROCEDURE: Excision with wire-guided localization LYMPH NODE SAMPLING: No lymph nodes present SPECIMEN INTEGRITY: Single intact specimen (margins can be evaluated) SPECIMEN SIZE: Greatest dimension: 10.0 cm Additional dimensions: 7.5 X 3.5 cm SPECIMEN LATERALITY: Right TUMOR SITE: INVASIVE CARCINOMA: Not specified TUMOR SIZE (LARGEST INVASIVE CARCINOMA): Microinvasion only (less than or equal to 0.1 cm) TUMOR FOCALITY: Single focus of invasive carcinoma MACROSCOPIC AND MICROSCOPIC EXTENT OF TUMOR - Skin: Skin is not present Skeletal Muscle: No skeletal muscle present HISTOLOGIC TYPE OF INVASIVE CARCINOMA: Ductal carcinoma in situ with microinvasion HISTOLOGIC GRADE: NOTTINGHAM SCORE: Glandular (Acinar)/Tubular  Differentiation: Only microinvasion present (not graded) Nuclear Pleomorphism: Only microinvasion present (not graded) Mitotic Count: Only microinvasion present (not graded) Overall Grade: Only microinvasion present (not graded) DUCTAL CARCINOMA IN SITU (DCIS): DCIS is present, EIC positive Size (extent of DCIS: Estimated size (extent) of DCIS (greatest dimension using gross and microscopic evaluation) is at least Multi-focal Architectural Patterns: Cribriform, Micropapillary, Solid Nuclear Grade: Grade II (intermediate) Necrosis: Present, central (expansive comedo necrosis) LOBULAR CARCINOMA IN SITU (LCIS): Not identified MARGINS: Margins uninvolved by invasive carcinoma Margin(s) uninvolved by DCIS (if present) Distance from closest margin: 0.1 cm Margin(s): Deep LYMPH-VASCULAR INVASION: Not identified DERMAL LYMPH-VASCULAR INVASION: No skin present  PATHOLOGIC STAGING (pTNM) PRIMARY TUMOR (pT): pT1, pT17mi REGIONAL LYMPH NODES (pN): pNX DISTANT METASTASIS (pM): pMX ANCILLARY STUDIES Estrogen Receptor: Performed on another specimen Immunoreactive tumor cells present (> or= 1%) Progesterone Receptor: Performed on another specimen Immunoreactive tumor cells present (> or = 1%) HER2/neu: Not performed Fluorescence In Situ Hybridization (FISH) for HER2/neu: Not performed MICROCALCIFICATIONS: Present in DCIS --------------------------------------------------------     Report Prepared By: Arnoldo Lenis, M.D., Resident-Pathology  I have personally reviewed the slides and/or other related materials referenced, and have edited the report as part of my pathologic assessment and final interpretation.  Electronically Signed Out By: Andy Gauss, M.D., Pathology 01/13/2013 16:25:52  brd/oia

## 2013-01-29 NOTE — Progress Notes (Signed)
Department of Radiation Oncology  Phone:  [phone] Fax:        [phone]   Name: Sheryl Sheryl Cox MRN: [id number]  DOB: [birth year]  Date: 01/29/2013  Follow Up Visit Note  Diagnosis: [id number] Invasive Right breast cancer  Interval History: Sheryl Sheryl Cox presents today for routine followup.  She had originally been evaluated by Dr. Luisa Sheryl Cox in Dr. Mitzi Sheryl [facility] in multidisciplinary clinic. A mastectomy and then recommended for multifocal DCIS. She saw a second opinion at [facility] and underwent a lumpectomy and bilateral breast reduction on 01/08/2013 her right lumpectomy showed invasive cancer measuring less than 0.1 cm in the background of multifocal ductal carcinoma in situ which was intermediate grade with some necrosis. No lymphovascular space invasion was noted. Microcalcifications were present. In the in the initial right lumpectomy specimen it states the margins were negative although a second deep margin was noted to have focal ductal carcinoma in situ extending to less than 1 mm from the inked margin. No sentinel lymph node biopsy was performed. The specimen size was 10 cm. Her original biopsy specimen was estrogen receptor positive at 100% and progesterone receptor positive at 100%. She was tested for BRCA was negative. She is healed up well from her surgery. She is still very sore and feels swollen. 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 She is interested in support groups as well to light guide here at the cancer Center. She has been cleared by surgery to begin radiation. She was taking tamoxifen prior to her surgery but had to stop taking it do to muscle aches. She would Sheryl Cox to discuss another form of antiestrogen therapy.  Allergies:  Allergies  Allergen Reactions  . Shrimp [Shellfish Allergy] Anaphylaxis    Medications:  Current Outpatient Prescriptions  Medication Sig Dispense Refill  . ferrous sulfate 325 (65 FE) MG tablet Take 1 tablet (325 mg total) by mouth  daily.  30 tablet  1  . Multiple Vitamins-Minerals (MULTIVITAMIN PO) Take 1 tablet by mouth daily.      . Olmesartan-Amlodipine-HCTZ (TRIBENZOR) 20-5-12.5 MG TABS Take 1 tablet by mouth every morning.  30 tablet  1   No current facility-administered medications for this encounter.    Physical Exam:  Filed Vitals:   01/29/13 1011  BP: 132/80  Pulse: 104  Temp: 99 F (37.2 C)   she has bilateral. Area lower incisions as well as an incision in the inferior aspect of her bilateral breasts. These are healing well with some hypopigmentation. There is no open wounds. No drainage. She has some fullness bilaterally and some edema within her skin. No palpable laterality several bilateral axilla. She is alert minus x3. She is 5 out of 5 strength bilaterally.  IMPRESSION: Sheryl Sheryl Cox is a 42 y.o. 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 I spoke to her surgeon about the margin issue and she felt Sheryl Cox after the plastic surgery it was impossible to know where the tumor cavity was. She did not go down to the pectoralis minor so but could not identify where this deep margin now was in the reconstructed breast. She also stated that they had discussed with the radiation oncologist at [facility] performing a sentinel lymph node biopsy. It was decided the high tangents will be used to treat the axilla rather than going back for  a sentinel lymph node biopsy for microinvasive disease. Sheryl Sheryl Cox also requested an appointment Sheryl Sheryl Cox to followup on anemia as well as to discuss a different medication than tamoxifen which she has been tolerating poorly. She was interested in in the light guide as well as our survivorship class. I will make a referral to social work for both of these. We discussed she probably has a couple more  weeks to heal as she still is very sore so I put her on for simulation next week to start the following week. We discussed the process of simulation the placement tattoos. We discussed skin darkness which can be temporary or permanent as a side effect of treatment. We discussed fatigue and a symptomatic lung damage as well. We discussed the likelihood of secondary malignancies. She has signed informed consent and agree to proceed forward. I scheduled her for simulation next week and we'll plan on beginning after Thanksgiving.    Sheryl Hare, MD

## 2013-01-31 ENCOUNTER — Inpatient Hospital Stay (HOSPITAL_COMMUNITY): Payer: BC Managed Care – PPO

## 2013-01-31 ENCOUNTER — Inpatient Hospital Stay (HOSPITAL_COMMUNITY)
Admission: AD | Admit: 2013-01-31 | Discharge: 2013-01-31 | Disposition: A | Discharge status: Home / Self Care | Payer: BC Managed Care – PPO | Source: Ambulatory Visit | Attending: Obstetrics | Admitting: Obstetrics

## 2013-01-31 ENCOUNTER — Encounter (HOSPITAL_COMMUNITY): Payer: Self-pay | Admitting: *Deleted

## 2013-01-31 DIAGNOSIS — R102 Pelvic and perineal pain: Secondary | ICD-10-CM

## 2013-01-31 DIAGNOSIS — R109 Unspecified abdominal pain: Secondary | ICD-10-CM | POA: Insufficient documentation

## 2013-01-31 DIAGNOSIS — K5900 Constipation, unspecified: Secondary | ICD-10-CM | POA: Insufficient documentation

## 2013-01-31 DIAGNOSIS — N949 Unspecified condition associated with female genital organs and menstrual cycle: Secondary | ICD-10-CM | POA: Insufficient documentation

## 2013-01-31 DIAGNOSIS — R9389 Abnormal findings on diagnostic imaging of other specified body structures: Secondary | ICD-10-CM | POA: Insufficient documentation

## 2013-01-31 DIAGNOSIS — N898 Other specified noninflammatory disorders of vagina: Secondary | ICD-10-CM | POA: Insufficient documentation

## 2013-01-31 LAB — URINALYSIS, ROUTINE W REFLEX MICROSCOPIC
Nitrite: NEGATIVE
Specific Gravity, Urine: 1.02 (ref 1.005–1.030)
Urobilinogen, UA: 0.2 mg/dL (ref 0.0–1.0)
pH: 6 (ref 5.0–8.0)

## 2013-01-31 LAB — URINE MICROSCOPIC-ADD ON

## 2013-01-31 LAB — WET PREP, GENITAL
Clue Cells Wet Prep HPF POC: NONE SEEN
Trich, Wet Prep: NONE SEEN
Yeast Wet Prep HPF POC: NONE SEEN

## 2013-01-31 MED ORDER — KETOROLAC TROMETHAMINE 60 MG/2ML IM SOLN
60.0000 mg | Freq: Once | INTRAMUSCULAR | Status: AC
  Administered 2013-01-31: 60 mg via INTRAMUSCULAR
  Filled 2013-01-31: qty 2

## 2013-01-31 NOTE — MAU Provider Note (Signed)
History     CSN: [id number]  Arrival date and time: 01/31/13 0845   None     Chief Complaint  Patient presents with  . Abdominal Pain   HPI 42 y.o. G1P1001 with low abd pain x 1 day, some yellow vaginal discharge, no bleeding, expecting period at any time. Had some recent constipation, took laxative yesterday and has had 3 normal BMs since that time.   Past Medical History  Diagnosis Date  . Hypertension   . Iron deficiency   . Bronchiectasis   . Urinary tract infection   . Fibroid   . Chlamydia   . Anemia   . 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 Cesarean section  01/05/2009  . Biopsy breast Left 12/05/12    fibroadenoma  . Breast surgery      Family History  Problem Relation Age of Onset  . Anesthesia problems Neg Hx   . Hypertension Mother   . Diabetes Mother   . Breast cancer Paternal Grandmother     dx in her 89s  . Prostate cancer Paternal Grandfather   . Prostate cancer Father 22  . Prostate cancer Paternal Uncle   . Breast cancer Other     paternal grandmother's sister    History  Substance Use Topics  . Smoking status: Never Smoker   . Smokeless tobacco: Never Used  . Alcohol Use: Yes     Comment: occa    Allergies:  Allergies  Allergen Reactions  . Shrimp [Shellfish Allergy] Anaphylaxis    Prescriptions prior to admission  Medication Sig Dispense Refill  . albuterol (PROVENTIL HFA;VENTOLIN HFA) 108 (90 BASE) MCG/ACT inhaler Inhale 1-2 puffs into the lungs every 6 (six) hours as needed for wheezing or shortness of breath (rescue).      . ferrous sulfate 325 (65 FE) MG tablet Take 1 tablet (325 mg total) by mouth daily.  30 tablet  1  . Multiple Vitamins-Minerals (MULTIVITAMIN PO) Take 1 tablet by mouth daily.      . naproxen sodium (ANAPROX) 220 MG tablet Take 440 mg by mouth 2 (two) times daily with a meal.      . Olmesartan-Amlodipine-HCTZ (TRIBENZOR) 20-5-12.5 MG TABS Take 1  tablet by mouth every morning.  30 tablet  1    Review of Systems  Constitutional: Negative.  Negative for fever and chills.  Respiratory: Negative.   Cardiovascular: Negative.   Gastrointestinal: Positive for abdominal pain. Negative for nausea, vomiting, diarrhea and constipation.  Genitourinary: Negative for dysuria, urgency, frequency, hematuria and flank pain.       Negative for vaginal bleeding, + vaginal discharge  Musculoskeletal: Negative.   Neurological: Negative.   Psychiatric/Behavioral: Negative.    Physical Exam   Blood pressure 131/78, pulse 108, temperature 97.4 F (36.3 C), resp. rate 18, height 5\' 3"  (1.6 m), weight 201 lb (91.173 kg), last menstrual period 01/15/2013.  Physical Exam  Nursing note and vitals reviewed. Constitutional: She is oriented to person, place, and time. She appears well-developed and well-nourished. No distress.  HENT:  Head: Normocephalic and atraumatic.  Cardiovascular: Normal rate, regular rhythm and normal heart sounds.   Respiratory: Effort normal and breath sounds normal. No respiratory distress.  GI: Soft. Bowel sounds are normal. She exhibits no distension and no mass. There is tenderness (diffuse). There is no rebound and no guarding.  Genitourinary: There is no rash or lesion on the right labia. There is  no rash or lesion on the left labia. Uterus is tender. Uterus is not deviated, not enlarged and not fixed. Cervix exhibits motion tenderness. Cervix exhibits no discharge and no friability. Right adnexum displays tenderness. Right adnexum displays no mass and no fullness. Left adnexum displays tenderness. Left adnexum displays no mass and no fullness. No erythema, tenderness or bleeding around the vagina. Vaginal discharge (copius yellow) found.  Exam limited by body habitus   Neurological: She is alert and oriented to person, place, and time.  Skin: Skin is warm and dry.  Psychiatric: She has a normal mood and affect.    MAU  Course  Procedures  Results for orders placed during the hospital encounter of 01/31/13 (from the past 24 hour(s))  URINALYSIS, ROUTINE W REFLEX MICROSCOPIC     Status: Abnormal   Collection Time    01/31/13  8:55 AM      Result Value Range   Color, Urine YELLOW  YELLOW   APPearance CLEAR  CLEAR   Specific Gravity, Urine 1.020  1.005 - 1.030   pH 6.0  5.0 - 8.0   Glucose, UA NEGATIVE  NEGATIVE mg/dL   Hgb urine dipstick SMALL (*) NEGATIVE   Bilirubin Urine NEGATIVE  NEGATIVE   Ketones, ur NEGATIVE  NEGATIVE mg/dL   Protein, ur NEGATIVE  NEGATIVE mg/dL   Urobilinogen, UA 0.2  0.0 - 1.0 mg/dL   Nitrite NEGATIVE  NEGATIVE   Leukocytes, UA MODERATE (*) NEGATIVE  URINE MICROSCOPIC-ADD ON     Status: Abnormal   Collection Time    01/31/13  8:55 AM      Result Value Range   Squamous Epithelial / LPF FEW (*) RARE   WBC, UA 11-20  <3 WBC/hpf   RBC / HPF 0-2  <3 RBC/hpf   Bacteria, UA RARE  RARE   Urine-Other MUCOUS PRESENT    POCT PREGNANCY, URINE     Status: None   Collection Time    01/31/13  9:22 AM      Result Value Range   Preg Test, Ur NEGATIVE  NEGATIVE  WET PREP, GENITAL     Status: Abnormal   Collection Time    01/31/13  9:52 AM      Result Value Range   Yeast Wet Prep HPF POC NONE SEEN  NONE SEEN   Trich, Wet Prep NONE SEEN  NONE SEEN   Clue Cells Wet Prep HPF POC NONE SEEN  NONE SEEN   WBC, Wet Prep HPF POC MANY (*) NONE SEEN   US Transvaginal Non-ob  01/31/2013   CLINICAL DATA:  Abdominal pain.  EXAM: TRANSABDOMINAL AND TRANSVAGINAL ULTRASOUND OF PELVIS  TECHNIQUE: Both transabdominal and transvaginal ultrasound examinations of the pelvis were performed. Transabdominal technique was performed for global imaging of the pelvis including uterus, ovaries, adnexal regions, and pelvic cul-de-sac. It was necessary to proceed with endovaginal exam following the transabdominal exam to visualize the uterus and ovaries to better advantage.  COMPARISON:  03/22/2011  FINDINGS:  Uterus  Measurements: 11 cm x 6.9 cm x 7.6 cm. Multiple fibroids. There is a 3.8 cm fundal fibroid. There are 2 posterior fibroids 1 measuring 3.7 cm in greatest dimension and the other 4 cm in greatest dimension. An anterior fibroid measures 1.5 cm in greatest dimension.  Endometrium  Thickness: Thickened to 2 cm. Mixed echogenicity. No endometrial fluid.  Right ovary  Measurements: 4.8 cm x 2.2 cm x 2.8 cm. Simple follicular cyst measuring 2 cm in size. Ovary otherwise unremarkable.  Left ovary  Measurements: 5.2 cm x 2.9 cm x 3.4 cm. Simple cyst measuring 3 cm.  Other findings  Trace free fluid.  IMPRESSION: 1. Multiple uterine fibroids which are similar to the prior exam. 2. Thickened and heterogeneous endometrium measuring 2 cm in thickness. Endometrial thickness is considered abnormal. Consider follow-up by [place] in 6-8 weeks, during the week immediately following menses (exam timing is critical). 3. Simple appearing bilateral ovarian cysts. These do not need further evaluation. 4. Exam otherwise unremarkable.   Electronically Signed   By: Amie Portland M.D.   On: 01/31/2013 13:42   US Pelvis Complete  01/31/2013   CLINICAL DATA:  Abdominal pain.  EXAM: TRANSABDOMINAL AND TRANSVAGINAL ULTRASOUND OF PELVIS  TECHNIQUE: Both transabdominal and transvaginal ultrasound examinations of the pelvis were performed. Transabdominal technique was performed for global imaging of the pelvis including uterus, ovaries, adnexal regions, and pelvic cul-de-sac. It was necessary to proceed with endovaginal exam following the transabdominal exam to visualize the uterus and ovaries to better advantage.  COMPARISON:  03/22/2011  FINDINGS: Uterus  Measurements: 11 cm x 6.9 cm x 7.6 cm. Multiple fibroids. There is a 3.8 cm fundal fibroid. There are 2 posterior fibroids 1 measuring 3.7 cm in greatest dimension and the other 4 cm in greatest dimension. An anterior fibroid measures 1.5 cm in greatest dimension.  Endometrium  Thickness:  Thickened to 2 cm. Mixed echogenicity. No endometrial fluid.  Right ovary  Measurements: 4.8 cm x 2.2 cm x 2.8 cm. Simple follicular cyst measuring 2 cm in size. Ovary otherwise unremarkable.  Left ovary  Measurements: 5.2 cm x 2.9 cm x 3.4 cm. Simple cyst measuring 3 cm.  Other findings  Trace free fluid.  IMPRESSION: 1. Multiple uterine fibroids which are similar to the prior exam. 2. Thickened and heterogeneous endometrium measuring 2 cm in thickness. Endometrial thickness is considered abnormal. Consider follow-up by [place] in 6-8 weeks, during the week immediately following menses (exam timing is critical). 3. Simple appearing bilateral ovarian cysts. These do not need further evaluation. 4. Exam otherwise unremarkable.   Electronically Signed   By: Amie Portland M.D.   On: 01/31/2013 13:42   Assessment and Plan   1. Endometrial thickening on ultra sound   2. Pelvic pain    Pt to call Dr. Elsie Stain office for f/u re: thickened endometrium. No obvious cause for pain found today - possible GI related. Continue Aleve and percocet at home PRN. GC/CT pending.     Medication List         albuterol 108 (90 BASE) MCG/ACT inhaler  Commonly known as:  PROVENTIL HFA;VENTOLIN HFA  Inhale 1-2 puffs into the lungs every 6 (six) hours as needed for wheezing or shortness of breath (rescue).     ferrous sulfate 325 (65 FE) MG tablet  Take 1 tablet (325 mg total) by mouth daily.     MULTIVITAMIN PO  Take 1 tablet by mouth daily.     naproxen sodium 220 MG tablet  Commonly known as:  ANAPROX  Take 440 mg by mouth 2 (two) times daily with a meal.     Olmesartan-Amlodipine-HCTZ 20-5-12.5 MG Tabs  Commonly known as:  TRIBENZOR  Take 1 tablet by mouth every morning.        Follow-up Information   Schedule an appointment as soon as possible for a visit with Kathreen Cosier, MD.   Specialty:  Obstetrics and Gynecology   Contact information:   [address] [place]  [address] [phone]         Braidyn Peace 01/31/2013, 2:06 PM

## 2013-01-31 NOTE — MAU Note (Signed)
Patient presents with complaint of lower abdominal pain since yesterday. States she took a laxative last night due to not having had a BM in 2 days but has since had 3 BM's. Patient is also taking Aleve post breast surgery bilaterally to remove cancer.

## 2013-01-31 NOTE — MAU Note (Signed)
Pt presents with complaints of lower abdominal pain that started last night. She says that she had surgery for breast cancer on October the 23rd. She has had some constipation and is taking stool softners for the constipation. Has also noticed some vaginal discharge with no odor.

## 2013-02-02 LAB — GC/CHLAMYDIA PROBE AMP: CT Probe RNA: NEGATIVE

## 2013-02-03 ENCOUNTER — Encounter: Payer: Self-pay | Admitting: *Deleted

## 2013-02-04 ENCOUNTER — Telehealth: Payer: Self-pay | Admitting: Oncology

## 2013-02-05 ENCOUNTER — Ambulatory Visit
Admission: RE | Admit: 2013-02-05 | Discharge: 2013-02-05 | Disposition: A | Discharge status: Home / Self Care | Payer: BC Managed Care – PPO | Source: Ambulatory Visit | Attending: Radiation Oncology | Admitting: Radiation Oncology

## 2013-02-05 DIAGNOSIS — C50411 Malignant neoplasm of upper-outer quadrant of right female breast: Secondary | ICD-10-CM

## 2013-02-05 DIAGNOSIS — L989 Disorder of the skin and subcutaneous tissue, unspecified: Secondary | ICD-10-CM | POA: Diagnosis not present

## 2013-02-05 DIAGNOSIS — R0989 Other specified symptoms and signs involving the circulatory and respiratory systems: Secondary | ICD-10-CM | POA: Diagnosis not present

## 2013-02-05 DIAGNOSIS — Z510 Encounter for antineoplastic radiation therapy: Secondary | ICD-10-CM | POA: Insufficient documentation

## 2013-02-05 DIAGNOSIS — C50919 Malignant neoplasm of unspecified site of unspecified female breast: Secondary | ICD-10-CM | POA: Insufficient documentation

## 2013-02-05 NOTE — Progress Notes (Signed)
Name: Sheryl Cox   MRN: [id number]  Date:  02/05/2013  DOB: 06/24/1970  Status:outpatient    DIAGNOSIS: Breast cancer.  CONSENT VERIFIED: yes   SET UP: Patient is setup supine   IMMOBILIZATION:  The following immobilization was used:Custom Moldable Pillow, breast board.   NARRATIVE: [name] was brought to the CT Simulation planning suite.  Identity was confirmed.  All relevant records and images related to the planned course of therapy were reviewed.  Then, the patient was positioned in a stable reproducible clinical set-up for radiation therapy.  Wires were placed to delineate the clinical extent of breast tissue. Due to there reduction, a scar for the lumpectomy cavity could not be identified.  CT images were obtained.  An isocenter was placed. Skin markings were placed.  The CT images were loaded into the planning software where the target and avoidance structures were contoured.  The radiation prescription was entered and confirmed. The patient was discharged in stable condition and tolerated simulation well.    TREATMENT PLANNING NOTE:  Treatment planning then occurred. I have requested : MLC's, isodose plan, basic dose calculation  I personally designed and supervised the construction of 3 medically necessary complex treatment devices for the protection of critical normal structures including the lungs and contralateral breast as well as the immobilization device which is necessary for set up certainty.

## 2013-02-10 ENCOUNTER — Encounter: Payer: Self-pay | Admitting: Radiation Oncology

## 2013-02-10 DIAGNOSIS — C50411 Malignant neoplasm of upper-outer quadrant of right female breast: Secondary | ICD-10-CM

## 2013-02-11 DIAGNOSIS — Z510 Encounter for antineoplastic radiation therapy: Secondary | ICD-10-CM | POA: Diagnosis not present

## 2013-02-16 ENCOUNTER — Ambulatory Visit
Admission: RE | Admit: 2013-02-16 | Discharge: 2013-02-16 | Disposition: A | Discharge status: Home / Self Care | Payer: BC Managed Care – PPO | Source: Ambulatory Visit | Attending: Radiation Oncology | Admitting: Radiation Oncology

## 2013-02-16 DIAGNOSIS — C50411 Malignant neoplasm of upper-outer quadrant of right female breast: Secondary | ICD-10-CM

## 2013-02-16 DIAGNOSIS — Z510 Encounter for antineoplastic radiation therapy: Secondary | ICD-10-CM | POA: Diagnosis not present

## 2013-02-17 ENCOUNTER — Ambulatory Visit
Admission: RE | Admit: 2013-02-17 | Discharge: 2013-02-17 | Disposition: A | Discharge status: Home / Self Care | Payer: BC Managed Care – PPO | Source: Ambulatory Visit | Attending: Radiation Oncology | Admitting: Radiation Oncology

## 2013-02-17 ENCOUNTER — Encounter: Payer: Self-pay | Admitting: Radiation Oncology

## 2013-02-17 VITALS — BP 142/90 | HR 115 | Temp 98.5°F | Ht 63.0 in | Wt 202.3 lb

## 2013-02-17 DIAGNOSIS — C50411 Malignant neoplasm of upper-outer quadrant of right female breast: Secondary | ICD-10-CM

## 2013-02-17 DIAGNOSIS — Z510 Encounter for antineoplastic radiation therapy: Secondary | ICD-10-CM | POA: Diagnosis not present

## 2013-02-17 MED ORDER — ALRA NON-METALLIC DEODORANT (RAD-ONC)
1.0000 "application " | Freq: Once | TOPICAL | Status: AC
  Administered 2013-02-17: 1 via TOPICAL

## 2013-02-17 MED ORDER — RADIAPLEXRX EX GEL
Freq: Once | CUTANEOUS | Status: AC
  Administered 2013-02-17: 14:00:00 via TOPICAL

## 2013-02-17 NOTE — Progress Notes (Signed)
  Radiation Oncology         [phone] ________________________________  Name: Sheryl Cox MRN: [id number]  Date: 02/16/2013  DOB: 06-06-1970  Simulation Verification Note  Status: outpatient  NARRATIVE: The patient was brought to the treatment unit and placed in the planned treatment position. The clinical setup was verified. Then port films were obtained and uploaded to the radiation oncology medical record software.  The treatment beams were carefully compared against the planned radiation fields. The position location and shape of the radiation fields was reviewed. The targeted volume of tissue appears appropriately covered by the radiation beams. Organs at risk appear to be excluded as planned.  Based on my personal review, I approved the simulation verification. The patient's treatment will proceed as planned.  ------------------------------------------------  Lurline Hare, MD

## 2013-02-17 NOTE — Progress Notes (Signed)
Weekly Management Note Current Dose:  2 Gy  Projected Dose: 50 Gy   Narrative:  The patient presents for routine under treatment assessment.  CBCT/MVCT images/Port film x-rays were reviewed.  The chart was checked. Doing well. Some chest congestion but no fever.   Physical Findings: Weight: 202 lb 4.8 oz (91.763 kg). Unchanged.  Impression:  The patient is tolerating radiation.  Plan:  Continue treatment as planned. RN education performed. Begin radiaplex.

## 2013-02-17 NOTE — Progress Notes (Addendum)
Ms. Dixson has received 1 fraction to her right breast.  Education today regarding potential side-effects for breast irradiation such as skin irritation, fatigue, and pain.  Given the Radiation Therapy and You with appropriate pages marked.  Teach back method. Given Radiaplex Gel with instructions to apply BID to right breast after each treatment and at bedtime.  Given [facility] calendar of events  Also given Alra Deodorant.   She reports she has a stuffy nose, hoarseness and chest congestion with a productive cough of light green sputum.  Afebrile today.

## 2013-02-18 ENCOUNTER — Ambulatory Visit
Admission: RE | Admit: 2013-02-18 | Discharge: 2013-02-18 | Disposition: A | Discharge status: Home / Self Care | Payer: BC Managed Care – PPO | Source: Ambulatory Visit | Attending: Radiation Oncology | Admitting: Radiation Oncology

## 2013-02-18 DIAGNOSIS — Z510 Encounter for antineoplastic radiation therapy: Secondary | ICD-10-CM | POA: Diagnosis not present

## 2013-02-19 ENCOUNTER — Ambulatory Visit
Admission: RE | Admit: 2013-02-19 | Discharge: 2013-02-19 | Disposition: A | Discharge status: Home / Self Care | Payer: BC Managed Care – PPO | Source: Ambulatory Visit | Attending: Radiation Oncology | Admitting: Radiation Oncology

## 2013-02-19 DIAGNOSIS — Z510 Encounter for antineoplastic radiation therapy: Secondary | ICD-10-CM | POA: Diagnosis not present

## 2013-02-20 ENCOUNTER — Ambulatory Visit
Admission: RE | Admit: 2013-02-20 | Discharge: 2013-02-20 | Disposition: A | Discharge status: Home / Self Care | Payer: BC Managed Care – PPO | Source: Ambulatory Visit | Attending: Radiation Oncology | Admitting: Radiation Oncology

## 2013-02-20 DIAGNOSIS — Z510 Encounter for antineoplastic radiation therapy: Secondary | ICD-10-CM | POA: Diagnosis not present

## 2013-02-23 ENCOUNTER — Ambulatory Visit
Admission: RE | Admit: 2013-02-23 | Discharge: 2013-02-23 | Disposition: A | Discharge status: Home / Self Care | Payer: BC Managed Care – PPO | Source: Ambulatory Visit | Attending: Radiation Oncology | Admitting: Radiation Oncology

## 2013-02-23 DIAGNOSIS — Z510 Encounter for antineoplastic radiation therapy: Secondary | ICD-10-CM | POA: Diagnosis not present

## 2013-02-24 ENCOUNTER — Telehealth: Payer: Self-pay

## 2013-02-24 ENCOUNTER — Ambulatory Visit: Payer: BC Managed Care – PPO

## 2013-02-24 NOTE — Telephone Encounter (Signed)
No answer to home to inquire about missing treatment.

## 2013-02-25 ENCOUNTER — Ambulatory Visit
Admission: RE | Admit: 2013-02-25 | Discharge: 2013-02-25 | Disposition: A | Discharge status: Home / Self Care | Payer: BC Managed Care – PPO | Source: Ambulatory Visit | Attending: Radiation Oncology | Admitting: Radiation Oncology

## 2013-02-25 DIAGNOSIS — Z510 Encounter for antineoplastic radiation therapy: Secondary | ICD-10-CM | POA: Diagnosis not present

## 2013-02-26 ENCOUNTER — Ambulatory Visit
Admission: RE | Admit: 2013-02-26 | Discharge: 2013-02-26 | Disposition: A | Discharge status: Home / Self Care | Payer: BC Managed Care – PPO | Source: Ambulatory Visit | Attending: Radiation Oncology | Admitting: Radiation Oncology

## 2013-02-26 DIAGNOSIS — Z510 Encounter for antineoplastic radiation therapy: Secondary | ICD-10-CM | POA: Diagnosis not present

## 2013-02-27 ENCOUNTER — Ambulatory Visit
Admission: RE | Admit: 2013-02-27 | Discharge: 2013-02-27 | Disposition: A | Discharge status: Home / Self Care | Payer: BC Managed Care – PPO | Source: Ambulatory Visit | Attending: Radiation Oncology | Admitting: Radiation Oncology

## 2013-02-27 DIAGNOSIS — Z510 Encounter for antineoplastic radiation therapy: Secondary | ICD-10-CM | POA: Diagnosis not present

## 2013-03-02 ENCOUNTER — Ambulatory Visit
Admission: RE | Admit: 2013-03-02 | Discharge: 2013-03-02 | Disposition: A | Discharge status: Home / Self Care | Payer: BC Managed Care – PPO | Source: Ambulatory Visit | Attending: Radiation Oncology | Admitting: Radiation Oncology

## 2013-03-02 ENCOUNTER — Ambulatory Visit: Payer: BC Managed Care – PPO

## 2013-03-02 DIAGNOSIS — Z510 Encounter for antineoplastic radiation therapy: Secondary | ICD-10-CM | POA: Diagnosis not present

## 2013-03-03 ENCOUNTER — Ambulatory Visit: Payer: BC Managed Care – PPO

## 2013-03-04 ENCOUNTER — Ambulatory Visit: Payer: BC Managed Care – PPO

## 2013-03-05 ENCOUNTER — Ambulatory Visit
Admission: RE | Admit: 2013-03-05 | Discharge: 2013-03-05 | Disposition: A | Discharge status: Home / Self Care | Payer: BC Managed Care – PPO | Source: Ambulatory Visit | Attending: Radiation Oncology | Admitting: Radiation Oncology

## 2013-03-05 VITALS — BP 127/73 | HR 100 | Temp 98.7°F | Wt 200.7 lb

## 2013-03-05 DIAGNOSIS — Z510 Encounter for antineoplastic radiation therapy: Secondary | ICD-10-CM | POA: Diagnosis not present

## 2013-03-05 DIAGNOSIS — C50411 Malignant neoplasm of upper-outer quadrant of right female breast: Secondary | ICD-10-CM

## 2013-03-05 NOTE — Progress Notes (Signed)
Patient for weekly assessment of radiation to right breast.Completed 10 of 25 treatments.Darkening of skin but no other changes.Missed 2 days of treatment due to car problems.Has headache for last 2 to 3 days.Taking tylenol for this.Increased fatigue.No other problems voiced.

## 2013-03-05 NOTE — Progress Notes (Signed)
Weekly Management Note Current Dose:  20 Gy  Projected Dose: 50 Gy   Narrative:  The patient presents for routine under treatment assessment.  CBCT/MVCT images/Port film x-rays were reviewed.  The chart was checked. Missed appts due to car troubles. Better now. Some skin darkening. Headache resolved with tylenol.   Physical Findings: Weight: 200 lb 11.2 oz (91.037 kg). Slightly dark right breast.   Impression:  The patient is tolerating radiation.  Plan:  Continue treatment as planned. Continue radiaplex. Encouraged her to contact us with help for social difficulties.

## 2013-03-06 ENCOUNTER — Ambulatory Visit
Admission: RE | Admit: 2013-03-06 | Discharge: 2013-03-06 | Disposition: A | Discharge status: Home / Self Care | Payer: BC Managed Care – PPO | Source: Ambulatory Visit | Attending: Radiation Oncology | Admitting: Radiation Oncology

## 2013-03-06 DIAGNOSIS — Z510 Encounter for antineoplastic radiation therapy: Secondary | ICD-10-CM | POA: Diagnosis not present

## 2013-03-09 ENCOUNTER — Ambulatory Visit
Admission: RE | Admit: 2013-03-09 | Discharge: 2013-03-09 | Disposition: A | Discharge status: Home / Self Care | Payer: BC Managed Care – PPO | Source: Ambulatory Visit | Attending: Radiation Oncology | Admitting: Radiation Oncology

## 2013-03-09 ENCOUNTER — Encounter: Payer: Self-pay | Admitting: Radiation Oncology

## 2013-03-09 DIAGNOSIS — Z510 Encounter for antineoplastic radiation therapy: Secondary | ICD-10-CM | POA: Diagnosis not present

## 2013-03-10 ENCOUNTER — Ambulatory Visit: Admission: RE | Admit: 2013-03-10 | Payer: BC Managed Care – PPO | Source: Ambulatory Visit

## 2013-03-11 ENCOUNTER — Ambulatory Visit: Payer: BC Managed Care – PPO

## 2013-03-11 ENCOUNTER — Ambulatory Visit
Admission: RE | Admit: 2013-03-11 | Discharge: 2013-03-11 | Disposition: A | Discharge status: Home / Self Care | Payer: BC Managed Care – PPO | Source: Ambulatory Visit | Attending: Radiation Oncology | Admitting: Radiation Oncology

## 2013-03-11 VITALS — BP 122/76 | HR 104 | Temp 99.1°F | Wt 197.7 lb

## 2013-03-11 DIAGNOSIS — C50411 Malignant neoplasm of upper-outer quadrant of right female breast: Secondary | ICD-10-CM

## 2013-03-11 DIAGNOSIS — Z510 Encounter for antineoplastic radiation therapy: Secondary | ICD-10-CM | POA: Diagnosis not present

## 2013-03-11 NOTE — Progress Notes (Signed)
Weekly Management Note Current Dose: 26  Gy  Projected Dose: 50 Gy   Narrative:  The patient presents for routine under treatment assessment.  CBCT/MVCT images/Port film x-rays were reviewed.  The chart was checked. Doing well. Tired. Sad about co-worker dying. Missing appointments.   Physical Findings: Weight: 197 lb 11.2 oz (89.676 kg). Slightly dark right breast  Impression:  The patient is tolerating radiation.  Plan:  Continue treatment as planned. Continue radiaplex. Refer to SW.

## 2013-03-11 NOTE — Progress Notes (Signed)
Patient here for weekly assessment of radiation to right breast.Skin with slight darkening.No peeling.Continued headache and mild productive  Cough.To try nasal decongestant.Increased fatigue.given letter to return to work in January 2015.

## 2013-03-13 ENCOUNTER — Ambulatory Visit
Admission: RE | Admit: 2013-03-13 | Discharge: 2013-03-13 | Disposition: A | Discharge status: Home / Self Care | Payer: BC Managed Care – PPO | Source: Ambulatory Visit | Attending: Radiation Oncology | Admitting: Radiation Oncology

## 2013-03-13 DIAGNOSIS — Z510 Encounter for antineoplastic radiation therapy: Secondary | ICD-10-CM | POA: Diagnosis not present

## 2013-03-16 ENCOUNTER — Ambulatory Visit: Payer: BC Managed Care – PPO

## 2013-03-17 ENCOUNTER — Inpatient Hospital Stay: Admission: RE | Admit: 2013-03-17 | Payer: Self-pay | Source: Ambulatory Visit | Admitting: Radiation Oncology

## 2013-03-17 ENCOUNTER — Ambulatory Visit
Admission: RE | Admit: 2013-03-17 | Discharge: 2013-03-17 | Disposition: A | Discharge status: Home / Self Care | Payer: BC Managed Care – PPO | Source: Ambulatory Visit | Attending: Radiation Oncology | Admitting: Radiation Oncology

## 2013-03-17 DIAGNOSIS — Z510 Encounter for antineoplastic radiation therapy: Secondary | ICD-10-CM | POA: Diagnosis not present

## 2013-03-17 NOTE — Progress Notes (Signed)
Patient doing well today.No questions or concerns will see Dr.Wentworth next Tuesday but knows to inform therapist if any questions arise.

## 2013-03-18 ENCOUNTER — Ambulatory Visit
Admission: RE | Admit: 2013-03-18 | Discharge: 2013-03-18 | Disposition: A | Discharge status: Home / Self Care | Payer: BC Managed Care – PPO | Source: Ambulatory Visit | Attending: Radiation Oncology | Admitting: Radiation Oncology

## 2013-03-18 DIAGNOSIS — Z510 Encounter for antineoplastic radiation therapy: Secondary | ICD-10-CM | POA: Diagnosis not present

## 2013-03-20 ENCOUNTER — Ambulatory Visit
Admission: RE | Admit: 2013-03-20 | Discharge: 2013-03-20 | Disposition: A | Discharge status: Home / Self Care | Payer: BC Managed Care – PPO | Source: Ambulatory Visit | Attending: Radiation Oncology | Admitting: Radiation Oncology

## 2013-03-20 DIAGNOSIS — Z510 Encounter for antineoplastic radiation therapy: Secondary | ICD-10-CM | POA: Diagnosis not present

## 2013-03-23 ENCOUNTER — Ambulatory Visit
Admission: RE | Admit: 2013-03-23 | Discharge: 2013-03-23 | Disposition: A | Discharge status: Home / Self Care | Payer: BC Managed Care – PPO | Source: Ambulatory Visit | Attending: Radiation Oncology | Admitting: Radiation Oncology

## 2013-03-23 DIAGNOSIS — Z510 Encounter for antineoplastic radiation therapy: Secondary | ICD-10-CM | POA: Diagnosis not present

## 2013-03-24 ENCOUNTER — Ambulatory Visit
Admission: RE | Admit: 2013-03-24 | Discharge: 2013-03-24 | Disposition: A | Discharge status: Home / Self Care | Payer: BC Managed Care – PPO | Source: Ambulatory Visit | Attending: Radiation Oncology | Admitting: Radiation Oncology

## 2013-03-24 ENCOUNTER — Encounter: Payer: Self-pay | Admitting: Radiation Oncology

## 2013-03-24 VITALS — BP 140/84 | HR 101 | Temp 98.9°F | Ht 63.0 in | Wt 202.6 lb

## 2013-03-24 DIAGNOSIS — Z510 Encounter for antineoplastic radiation therapy: Secondary | ICD-10-CM | POA: Diagnosis not present

## 2013-03-24 DIAGNOSIS — C50411 Malignant neoplasm of upper-outer quadrant of right female breast: Secondary | ICD-10-CM

## 2013-03-24 NOTE — Progress Notes (Signed)
Weekly Management Note Current Dose: 38  Gy  Projected Dose: 50 Gy   Narrative:  The patient presents for routine under treatment assessment.  CBCT/MVCT images/Port film x-rays were reviewed.  The chart was checked. She askedf or a note clarifying her lifting ability which I wrote for her.  She also had questions about her number of treatments  Physical Findings: Weight: 202 lb 9.6 oz (91.899 kg). Slightly hyperpigmented right breast  Impression:  The patient is tolerating radiation.  Plan:  Continue treatment as planned. I clarified with er that she would not be receiving a boost due to her reduction at the time of her lumpectomy there is no way to localize a "lumpectomy cavity". She will finish her treatments next Wednesday.

## 2013-03-24 NOTE — Progress Notes (Signed)
Sheryl Cox has received 19 fractions to her right breast.  hyperpigmentation noted, but skin remains intact.  She c/o aching of her breast and she reports intermittent shooting pains which was explained as the irritation of the nerves that will gradually resolve in the future.  She appears and admits to fatigue today.

## 2013-03-25 ENCOUNTER — Ambulatory Visit
Admission: RE | Admit: 2013-03-25 | Discharge: 2013-03-25 | Disposition: A | Discharge status: Home / Self Care | Payer: BC Managed Care – PPO | Source: Ambulatory Visit | Attending: Radiation Oncology | Admitting: Radiation Oncology

## 2013-03-25 ENCOUNTER — Ambulatory Visit: Payer: BC Managed Care – PPO

## 2013-03-25 ENCOUNTER — Ambulatory Visit: Payer: Self-pay | Admitting: Oncology

## 2013-03-25 DIAGNOSIS — Z510 Encounter for antineoplastic radiation therapy: Secondary | ICD-10-CM | POA: Diagnosis not present

## 2013-03-26 ENCOUNTER — Ambulatory Visit: Payer: BC Managed Care – PPO

## 2013-03-26 ENCOUNTER — Ambulatory Visit
Admission: RE | Admit: 2013-03-26 | Discharge: 2013-03-26 | Disposition: A | Discharge status: Home / Self Care | Payer: BC Managed Care – PPO | Source: Ambulatory Visit | Attending: Radiation Oncology | Admitting: Radiation Oncology

## 2013-03-26 DIAGNOSIS — Z510 Encounter for antineoplastic radiation therapy: Secondary | ICD-10-CM | POA: Diagnosis not present

## 2013-03-27 ENCOUNTER — Ambulatory Visit
Admission: RE | Admit: 2013-03-27 | Discharge: 2013-03-27 | Disposition: A | Discharge status: Home / Self Care | Payer: BC Managed Care – PPO | Source: Ambulatory Visit | Attending: Radiation Oncology | Admitting: Radiation Oncology

## 2013-03-27 ENCOUNTER — Ambulatory Visit: Payer: BC Managed Care – PPO

## 2013-03-27 DIAGNOSIS — Z510 Encounter for antineoplastic radiation therapy: Secondary | ICD-10-CM | POA: Diagnosis not present

## 2013-03-30 ENCOUNTER — Ambulatory Visit: Payer: BC Managed Care – PPO

## 2013-03-30 ENCOUNTER — Ambulatory Visit
Admission: RE | Admit: 2013-03-30 | Discharge: 2013-03-30 | Disposition: A | Discharge status: Home / Self Care | Payer: BC Managed Care – PPO | Source: Ambulatory Visit | Attending: Radiation Oncology | Admitting: Radiation Oncology

## 2013-03-30 DIAGNOSIS — Z510 Encounter for antineoplastic radiation therapy: Secondary | ICD-10-CM | POA: Diagnosis not present

## 2013-03-31 ENCOUNTER — Encounter: Payer: Self-pay | Admitting: Radiation Oncology

## 2013-03-31 ENCOUNTER — Ambulatory Visit: Payer: BC Managed Care – PPO

## 2013-03-31 ENCOUNTER — Ambulatory Visit
Admission: RE | Admit: 2013-03-31 | Discharge: 2013-03-31 | Disposition: A | Discharge status: Home / Self Care | Payer: BC Managed Care – PPO | Source: Ambulatory Visit | Attending: Radiation Oncology | Admitting: Radiation Oncology

## 2013-03-31 DIAGNOSIS — Z510 Encounter for antineoplastic radiation therapy: Secondary | ICD-10-CM | POA: Diagnosis not present

## 2013-03-31 DIAGNOSIS — C50411 Malignant neoplasm of upper-outer quadrant of right female breast: Secondary | ICD-10-CM

## 2013-03-31 NOTE — Progress Notes (Signed)
Weekly Management Note Current Dose: 48  Gy  Projected Dose: 50 Gy   Narrative:  The patient presents for routine under treatment assessment.  CBCT/MVCT images/Port film x-rays were reviewed.  The chart was checked. Doing well. Irritation under right arm. Ready to go back to work. Needs financial assistance for bills/meds, etc.   Physical Findings: Irritation of right breast. Right axilla is dark. No moist desquamation.   Impression:  The patient is tolerating radiation.  Plan:  Continue treatment as planned. Continue radiaplex. Finishes tomorrow. Follow up in 1 month. Will need med onc/surgery follow up. ? At [facility].

## 2013-03-31 NOTE — Progress Notes (Signed)
Dr. Pablo Ledger sent patient around to discuss options and whether or not she qualifies for [employer].  Met with patient and told her to bring proof of income (she just started work this week and will get with her employer to see if they can provide [place] with documentation).  She will get this together and return either Wednesday or Thursday this week.

## 2013-03-31 NOTE — Progress Notes (Signed)
Patient for weekly assessment of radiation to right breast.Discomfort of right axilla.Skin is darkened with line of demarcation of mammary fold.Will give biafine today to aplly 2 to 3 times daily.Knows to apply over next 2 to 3 weeks, then change to lotion with vitamin e.Given one month follow up card to be scheduled tomorrow.

## 2013-04-01 ENCOUNTER — Ambulatory Visit: Payer: BC Managed Care – PPO

## 2013-04-01 ENCOUNTER — Encounter: Payer: Self-pay | Admitting: Radiation Oncology

## 2013-04-01 ENCOUNTER — Ambulatory Visit
Admission: RE | Admit: 2013-04-01 | Discharge: 2013-04-01 | Disposition: A | Discharge status: Home / Self Care | Payer: BC Managed Care – PPO | Source: Ambulatory Visit | Attending: Radiation Oncology | Admitting: Radiation Oncology

## 2013-04-01 DIAGNOSIS — Z510 Encounter for antineoplastic radiation therapy: Secondary | ICD-10-CM | POA: Diagnosis not present

## 2013-04-01 DIAGNOSIS — D0510 Intraductal carcinoma in situ of unspecified breast: Secondary | ICD-10-CM

## 2013-04-01 MED ORDER — BIAFINE EX EMUL
CUTANEOUS | Status: DC | PRN
  Administered 2013-04-01: 1 via TOPICAL

## 2013-04-06 NOTE — Progress Notes (Signed)
°  Radiation Oncology         [phone] ________________________________  Name: CARON ODE MRN: [id number]  Date: 04/01/2013  DOB: [birth year]  End of Treatment Note  Diagnosis:   [id number] Right Breast Cancer     Indication for treatment:  Curative       Radiation treatment dates:   03/07/2013-04/01/2013  Site/dose:   Right breast/ 50 Gy at 2 Gy per fraction x 25 fractoins  Beams/energy:   Opposed tangents with reduced fields  Narrative: The patient tolerated radiation treatment relatively well.   She was treated with high tangents to cover the axilla. She had moderate skin darkening. She had skin irritation under her arm. She had no moist desquamation.   Plan: The patient has completed radiation treatment. The patient will return to radiation oncology clinic for routine followup in one month. I advised them to call or return sooner if they have any questions or concerns related to their recovery or treatment.She requested to return to work full time.  I have written a note to that effect.  I did limit her to lifting 20-30 pounds as she was quite fatigued and still had some skin irritation.  I was also not sure what her surgeon would like after her reduction. If she requires no restrictions, she will need to follow up with her surgeon. She has follow up scheduled with medical oncology.   ------------------------------------------------  [name], MD

## 2013-05-01 ENCOUNTER — Telehealth: Payer: Self-pay | Admitting: Radiation Oncology

## 2013-05-01 NOTE — Telephone Encounter (Signed)
Per Patric Dykes, RN, gave patient a copy of SW's letter and copies of her disability papers.

## 2013-05-07 ENCOUNTER — Other Ambulatory Visit: Payer: Self-pay | Admitting: Oncology

## 2013-05-07 ENCOUNTER — Ambulatory Visit
Admission: RE | Admit: 2013-05-07 | Discharge: 2013-05-07 | Disposition: A | Discharge status: Home / Self Care | Payer: BC Managed Care – PPO | Source: Ambulatory Visit | Attending: Radiation Oncology | Admitting: Radiation Oncology

## 2013-05-07 ENCOUNTER — Telehealth: Payer: Self-pay | Admitting: Oncology

## 2013-05-07 VITALS — BP 144/85 | HR 109 | Temp 98.2°F | Wt 203.6 lb

## 2013-05-07 DIAGNOSIS — C50411 Malignant neoplasm of upper-outer quadrant of right female breast: Secondary | ICD-10-CM

## 2013-05-07 NOTE — Telephone Encounter (Signed)
, °

## 2013-05-07 NOTE — Progress Notes (Signed)
   Department of Radiation Oncology  Phone:  [phone] Fax:        [phone]   Name: Sheryl Cox MRN: [id number]  DOB: 02-21-1971  Date: 05/07/2013  Follow Up Visit Note  Diagnosis: T1aN0 Right breast cancer  Summary and Interval since last radiation: 50 Gy to the right breast completed 05/02/13  Interval History: Sheryl Cox presents today for routine followup.  She has returned to work.  We finally had to sign her work papers releasing her to lift 50-100 pounds but she doesn't ever lift that much. She has healed well. She has minimal pain and is using cocoa butter and vitamin e. She has not been seen in medical oncology. She is interested in getting pregnant again.   Allergies:  Allergies  Allergen Reactions  . Shrimp [Shellfish Allergy] Anaphylaxis  . Shellfish-Derived Products Swelling    Medications:  Current Outpatient Prescriptions  Medication Sig Dispense Refill  . albuterol (PROVENTIL HFA;VENTOLIN HFA) 108 (90 BASE) MCG/ACT inhaler Inhale 1-2 puffs into the lungs every 6 (six) hours as needed for wheezing or shortness of breath (rescue).      . ferrous sulfate 325 (65 FE) MG tablet Take 1 tablet (325 mg total) by mouth daily.  30 tablet  1  . ibuprofen (ADVIL,MOTRIN) 800 MG tablet Take 800 mg by mouth every 8 (eight) hours as needed.      . Multiple Vitamins-Minerals (MULTIVITAMIN PO) Take 1 tablet by mouth daily.      . Olmesartan-Amlodipine-HCTZ (TRIBENZOR) 20-5-12.5 MG TABS Take 1 tablet by mouth every morning.  30 tablet  1   No current facility-administered medications for this encounter.    Physical Exam:  Filed Vitals:   05/07/13 1431  BP: 144/85  Pulse: 109  Temp: 98.2 F (36.8 C)  Weight: 203 lb 9.6 oz (92.352 kg)   Some remaining skin darkening. No desquamation.   IMPRESSION: Sheryl Cox is a 43 y.o. female s/p breast conservation with resolving acute effects of treatment  PLAN:  Doing well. Follow up with me in 3 months. Will put  In order for  [facility] follow up to discuss tamoxifen. Mammogram in 6 months.     [name], MD

## 2013-05-07 NOTE — Telephone Encounter (Signed)
POF sent to you

## 2013-05-07 NOTE — Progress Notes (Signed)
Patient for one month follow up completion of radiation on 04/01/13.Shooting pains through breast which I informed  Is normal.Skin discoloration is improving.

## 2013-05-08 ENCOUNTER — Telehealth: Payer: Self-pay | Admitting: Oncology

## 2013-05-08 NOTE — Telephone Encounter (Signed)
, °

## 2013-05-12 ENCOUNTER — Telehealth: Payer: Self-pay | Admitting: Oncology

## 2013-05-12 NOTE — Telephone Encounter (Signed)
, °

## 2013-05-15 ENCOUNTER — Ambulatory Visit: Payer: Self-pay | Admitting: Adult Health

## 2013-05-18 ENCOUNTER — Ambulatory Visit: Payer: Self-pay | Admitting: Oncology

## 2013-06-05 ENCOUNTER — Encounter (HOSPITAL_COMMUNITY): Payer: Self-pay | Admitting: Emergency Medicine

## 2013-06-05 ENCOUNTER — Observation Stay (HOSPITAL_COMMUNITY)
Admission: EM | Admit: 2013-06-05 | Discharge: 2013-06-06 | Disposition: A | Discharge status: Home / Self Care | Payer: BC Managed Care – PPO | Attending: Internal Medicine | Admitting: Internal Medicine

## 2013-06-05 DIAGNOSIS — C50411 Malignant neoplasm of upper-outer quadrant of right female breast: Secondary | ICD-10-CM

## 2013-06-05 DIAGNOSIS — D509 Iron deficiency anemia, unspecified: Secondary | ICD-10-CM | POA: Diagnosis not present

## 2013-06-05 DIAGNOSIS — Z853 Personal history of malignant neoplasm of breast: Secondary | ICD-10-CM | POA: Diagnosis not present

## 2013-06-05 DIAGNOSIS — R000 Tachycardia, unspecified: Secondary | ICD-10-CM

## 2013-06-05 DIAGNOSIS — C50419 Malignant neoplasm of upper-outer quadrant of unspecified female breast: Secondary | ICD-10-CM

## 2013-06-05 DIAGNOSIS — D649 Anemia, unspecified: Secondary | ICD-10-CM | POA: Diagnosis present

## 2013-06-05 DIAGNOSIS — D259 Leiomyoma of uterus, unspecified: Secondary | ICD-10-CM | POA: Diagnosis not present

## 2013-06-05 DIAGNOSIS — Z923 Personal history of irradiation: Secondary | ICD-10-CM | POA: Diagnosis not present

## 2013-06-05 DIAGNOSIS — N920 Excessive and frequent menstruation with regular cycle: Secondary | ICD-10-CM | POA: Diagnosis not present

## 2013-06-05 DIAGNOSIS — I10 Essential (primary) hypertension: Secondary | ICD-10-CM | POA: Diagnosis not present

## 2013-06-05 DIAGNOSIS — D0510 Intraductal carcinoma in situ of unspecified breast: Secondary | ICD-10-CM

## 2013-06-05 LAB — CBC WITH DIFFERENTIAL/PLATELET
BASOS PCT: 0 % (ref 0–1)
Basophils Absolute: 0 10*3/uL (ref 0.0–0.1)
EOS ABS: 0.2 10*3/uL (ref 0.0–0.7)
Eosinophils Relative: 4 % (ref 0–5)
HEMATOCRIT: 21.3 % — AB (ref 36.0–46.0)
HEMOGLOBIN: 5.6 g/dL — AB (ref 12.0–15.0)
LYMPHS PCT: 15 % (ref 12–46)
Lymphs Abs: 0.8 10*3/uL (ref 0.7–4.0)
MCH: 15 pg — ABNORMAL LOW (ref 26.0–34.0)
MCHC: 26.3 g/dL — AB (ref 30.0–36.0)
MCV: 57.1 fL — AB (ref 78.0–100.0)
MONOS PCT: 7 % (ref 3–12)
Monocytes Absolute: 0.4 10*3/uL (ref 0.1–1.0)
NEUTROS ABS: 3.6 10*3/uL (ref 1.7–7.7)
Neutrophils Relative %: 74 % (ref 43–77)
Platelets: 372 10*3/uL (ref 150–400)
RBC: 3.73 MIL/uL — ABNORMAL LOW (ref 3.87–5.11)
RDW: 21.7 % — ABNORMAL HIGH (ref 11.5–15.5)
WBC: 5 10*3/uL (ref 4.0–10.5)

## 2013-06-05 LAB — HEPATIC FUNCTION PANEL
ALBUMIN: 4 g/dL (ref 3.5–5.2)
ALK PHOS: 25 U/L — AB (ref 39–117)
ALT: 11 U/L (ref 0–35)
AST: 28 U/L (ref 0–37)
BILIRUBIN TOTAL: 0.4 mg/dL (ref 0.3–1.2)
Total Protein: 7.6 g/dL (ref 6.0–8.3)

## 2013-06-05 LAB — POC OCCULT BLOOD, ED: Fecal Occult Bld: NEGATIVE

## 2013-06-05 LAB — BASIC METABOLIC PANEL
BUN: 15 mg/dL (ref 6–23)
CO2: 23 meq/L (ref 19–32)
Calcium: 9.5 mg/dL (ref 8.4–10.5)
Chloride: 101 mEq/L (ref 96–112)
Creatinine, Ser: 0.9 mg/dL (ref 0.50–1.10)
GFR calc Af Amer: 89 mL/min — ABNORMAL LOW (ref >90)
GFR calc non Af Amer: 77 mL/min — ABNORMAL LOW (ref >90)
GLUCOSE: 111 mg/dL — AB (ref 70–99)
POTASSIUM: 3.9 meq/L (ref 3.7–5.3)
SODIUM: 140 meq/L (ref 137–147)

## 2013-06-05 LAB — ABO/RH: ABO/RH(D): O POS

## 2013-06-05 LAB — PREPARE RBC (CROSSMATCH)

## 2013-06-05 MED ORDER — BISACODYL 10 MG RE SUPP: 10.0000 mg | Freq: Every day | RECTAL | Status: DC | PRN

## 2013-06-05 MED ORDER — TAMOXIFEN CITRATE 10 MG PO TABS
10.0000 mg | ORAL_TABLET | Freq: Every day | ORAL | Status: DC
  Administered 2013-06-05 – 2013-06-06 (×2): 10 mg via ORAL
  Filled 2013-06-05 (×2): qty 1

## 2013-06-05 MED ORDER — ONDANSETRON HCL 4 MG PO TABS: 4.0000 mg | ORAL_TABLET | Freq: Four times a day (QID) | ORAL | Status: DC | PRN

## 2013-06-05 MED ORDER — ALUM & MAG HYDROXIDE-SIMETH 200-200-20 MG/5ML PO SUSP: 30.0000 mL | Freq: Four times a day (QID) | ORAL | Status: DC | PRN

## 2013-06-05 MED ORDER — OLMESARTAN-AMLODIPINE-HCTZ 20-5-12.5 MG PO TABS: 1.0000 | ORAL_TABLET | Freq: Every morning | ORAL | Status: DC

## 2013-06-05 MED ORDER — HYDROCODONE-ACETAMINOPHEN 5-325 MG PO TABS: 1.0000 | ORAL_TABLET | ORAL | Status: DC | PRN

## 2013-06-05 MED ORDER — ACETAMINOPHEN 325 MG PO TABS
650.0000 mg | ORAL_TABLET | Freq: Once | ORAL | Status: AC
  Administered 2013-06-05: 650 mg via ORAL
  Filled 2013-06-05: qty 2

## 2013-06-05 MED ORDER — IRBESARTAN 150 MG PO TABS
150.0000 mg | ORAL_TABLET | Freq: Every day | ORAL | Status: DC
  Administered 2013-06-05 – 2013-06-06 (×2): 150 mg via ORAL
  Filled 2013-06-05 (×2): qty 1

## 2013-06-05 MED ORDER — ACETAMINOPHEN 325 MG PO TABS: 650.0000 mg | ORAL_TABLET | Freq: Four times a day (QID) | ORAL | Status: DC | PRN

## 2013-06-05 MED ORDER — HYDROCHLOROTHIAZIDE 12.5 MG PO CAPS
12.5000 mg | ORAL_CAPSULE | Freq: Every day | ORAL | Status: DC
  Administered 2013-06-05 – 2013-06-06 (×2): 12.5 mg via ORAL
  Filled 2013-06-05 (×2): qty 1

## 2013-06-05 MED ORDER — HYDROMORPHONE HCL PF 1 MG/ML IJ SOLN: 1.0000 mg | INTRAMUSCULAR | Status: DC | PRN

## 2013-06-05 MED ORDER — ALBUTEROL SULFATE (2.5 MG/3ML) 0.083% IN NEBU: 2.5000 mg | INHALATION_SOLUTION | Freq: Four times a day (QID) | RESPIRATORY_TRACT | Status: DC | PRN

## 2013-06-05 MED ORDER — ACETAMINOPHEN 650 MG RE SUPP: 650.0000 mg | Freq: Four times a day (QID) | RECTAL | Status: DC | PRN

## 2013-06-05 MED ORDER — ALBUTEROL SULFATE HFA 108 (90 BASE) MCG/ACT IN AERS: 1.0000 | INHALATION_SPRAY | Freq: Four times a day (QID) | RESPIRATORY_TRACT | Status: DC | PRN

## 2013-06-05 MED ORDER — DOCUSATE SODIUM 100 MG PO CAPS
100.0000 mg | ORAL_CAPSULE | Freq: Two times a day (BID) | ORAL | Status: DC
Start: 2013-06-05 — End: 2013-06-06
  Administered 2013-06-05: 100 mg via ORAL
  Filled 2013-06-05 (×3): qty 1

## 2013-06-05 MED ORDER — ONDANSETRON HCL 4 MG/2ML IJ SOLN: 4.0000 mg | Freq: Four times a day (QID) | INTRAMUSCULAR | Status: DC | PRN

## 2013-06-05 MED ORDER — SODIUM CHLORIDE 0.9 % IJ SOLN
3.0000 mL | Freq: Two times a day (BID) | INTRAMUSCULAR | Status: DC
  Administered 2013-06-05 – 2013-06-06 (×2): 3 mL via INTRAVENOUS

## 2013-06-05 NOTE — H&P (Signed)
History and Physical       Hospital Admission Note Date: 06/05/2013  Patient name: Sheryl Cox Medical record number: [id number] Date of birth: 07-Apr-1970 Age: 43 y.o. Gender: female  PCP: [name], MD    Chief Complaint:  Sent by PCP for low hemoglobin  HPI: Patient is a 43 year old female with history of right breast cancer, recently finished radiation therapy, currently on tamoxifen, has a known history of uterine fibroids, hypertension, presented to the ER after called by her PCPs office. Per patient she was having upper respiratory symptoms when she had gone to PCPs office for regular followup. Labs were done and was called today as her hemoglobin was 5.3 with hematocrit of 12. Patient was having dizziness and lightheadedness, otherwise no chest pain or any significant shortness of breath. Patient does endorse menorrhagia, lasting 5 days and uses about 6-7 pads. She has a known history of multiple uterine fibroids, follows with GYN, Dr. [name]. Patient reports that she was going to have surgery for uterine fibroids but has not scheduled it yet.  Review of Systems:  Constitutional: Denies fever, chills, diaphoresis, poor appetite and fatigue.  HEENT: Denies photophobia, eye pain, redness, hearing loss, ear pain, congestion, sore throat, rhinorrhea, sneezing, mouth sores, trouble swallowing, neck pain, neck stiffness and tinnitus.   Respiratory: Denies SOB, DOE, cough, chest tightness,  and wheezing.   Cardiovascular: Denies chest pain, palpitations and leg swelling.  Gastrointestinal: Denies nausea, vomiting, abdominal pain, diarrhea, constipation, blood in stool and abdominal distention.  Genitourinary: Denies dysuria, urgency, frequency, hematuria, flank pain and difficulty urinating.  Musculoskeletal: Denies myalgias, back pain, joint swelling, arthralgias and gait problem.  Skin: Denies pallor, rash and wound.   Neurological: Dizziness with lightheadedness otherwise no seizure or syncope.  Hematological: Denies adenopathy. Please see history of present illness Psychiatric/Behavioral: Denies suicidal ideation, mood changes, confusion, nervousness, sleep disturbance and agitation  Past Medical History: Past Medical History  Diagnosis Date  . Hypertension   . Iron deficiency   . Bronchiectasis   . Urinary tract infection   . Fibroid   . Chlamydia   . Anemia   . 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 Radiation 03/07/13-04/01/13    Right breast  50 Gy   Past Surgical History  Procedure Laterality Date  . Cesarean section  01/05/2009  . Biopsy breast Left 12/05/12    fibroadenoma  . Breast surgery      Medications: Prior to Admission medications   Medication Sig Start Date End Date Taking? Authorizing Provider  albuterol (PROVENTIL HFA;VENTOLIN HFA) 108 (90 BASE) MCG/ACT inhaler Inhale 1-2 puffs into the lungs every 6 (six) hours as needed for wheezing or shortness of breath (rescue).   Yes Historical Provider, MD  Multiple Vitamins-Minerals (MULTIVITAMIN PO) Take 1 tablet by mouth daily.   Yes Historical Provider, MD  Olmesartan-Amlodipine-HCTZ (TRIBENZOR) 20-5-12.5 MG TABS Take 1 tablet by mouth every morning. 07/02/12  Yes Nat Christen, MD  Specialty Vitamins Products (ONE-A-DAY ENERGY FORMULA PO) Take 1 tablet by mouth daily.   Yes Historical Provider, MD  tamoxifen (NOLVADEX) 10 MG tablet Take 10 mg by mouth daily.   Yes Historical Provider, MD    Allergies:   Allergies  Allergen Reactions  . Shrimp [Shellfish Allergy] Anaphylaxis  . Shellfish-Derived Products Swelling    Social History:  reports that she has never smoked. She has never used smokeless tobacco. She reports that she drinks alcohol. She reports that she does not use illicit drugs.  Family History: Family History  Problem Relation Age of Onset  . Anesthesia problems Neg Hx   . Hypertension Mother   . Diabetes  Mother   . Breast cancer Paternal Grandmother     dx in her 84s  . Prostate cancer Paternal Grandfather   . Prostate cancer Father 69  . Prostate cancer Paternal Uncle   . Breast cancer Other     paternal grandmother's sister    Physical Exam: Blood pressure 141/86, pulse 97, temperature 99.8 F (37.7 C), temperature source Oral, resp. rate 23, height 5\' 3"  (1.6 m), weight 90.674 kg (199 lb 14.4 oz), SpO2 100.00%. General: Alert, awake, oriented x3, in no acute distress. HEENT: normocephalic, atraumatic, anicteric sclera, pink conjunctiva, pupils equal and reactive to light and accomodation, oropharynx clear Neck: supple, no masses or lymphadenopathy, no goiter, no bruits  Heart: Regular rate and rhythm, without murmurs, rubs or gallops. Lungs: Clear to auscultation bilaterally, no wheezing, rales or rhonchi. Abdomen: Soft, nontender, nondistended, positive bowel sounds, no masses. Extremities: No clubbing, cyanosis or edema with positive pedal pulses. Neuro: Grossly intact, no focal neurological deficits, strength 5/5 upper and lower extremities bilaterally Psych: alert and oriented x 3, normal mood and affect Skin: no rashes or lesions, warm and dry   LABS on Admission:  Basic Metabolic Panel:  Recent Labs Lab 06/05/13 1158  NA 140  K 3.9  CL 101  CO2 23  GLUCOSE 111*  BUN 15  CREATININE 0.90  CALCIUM 9.5   Liver Function Tests:  Recent Labs Lab 06/05/13 1158  AST 28  ALT 11  ALKPHOS 25*  BILITOT 0.4  PROT 7.6  ALBUMIN 4.0   No results found for this basename: LIPASE, AMYLASE,  in the last 168 hours No results found for this basename: AMMONIA,  in the last 168 hours CBC:  Recent Labs Lab 06/05/13 1158  WBC 5.0  NEUTROABS 3.6  HGB 5.6*  HCT 21.3*  MCV 57.1*  PLT 372   Cardiac Enzymes: No results found for this basename: CKTOTAL, CKMB, CKMBINDEX, TROPONINI,  in the last 168 hours BNP: No components found with this basename: POCBNP,  CBG: No  results found for this basename: GLUCAP,  in the last 168 hours   Radiological Exams on Admission: No results found.  Assessment/Plan Principal Problem:   Anemia likely due to a known history of menorrhagia due to multiple fibroid uterus -Admit for observation and blood transfusion. - Hemoglobin 5.6 in the ER, Stool occult test negative - She has a known history of multiple uterine fibroids per the ultrasound on 11 /2014. She already has GYN, Dr. [name]. Will place case management consult to make appointment with Dr. [name] for further management of the uterine fibroids. - Will obtain anemia panel, will likely need IV iron replacement - follow H&H and may need 3rd unit of blood transfusion, 2 units started in the ER.   Active Problems:   Fibroid uterus  Menorrhagia: As #1   Constipation - Placed on Colace and Dulcolax suppository as needed  History of breast cancer - Continue followup with Dr. Pablo Ledger and Dr. Humphrey Rolls, continue tamoxifen  DVT prophylaxis: SCD's  CODE STATUS: Full Code  Family Communication: Admission, patients condition and plan of care including tests being ordered have been discussed with the patient who indicates understanding and agree with the plan and Code Status   Further plan will depend as patient's clinical course evolves and further radiologic and laboratory data become available.   Time Spent on  Admission: 1 hour  Kentavius Dettore M.D. [facility] 06/05/2013, 3:56 PM Pager: [phone]  If 7PM-7AM, please contact night-coverage [email or web address] Password [email or web address]

## 2013-06-05 NOTE — Progress Notes (Signed)
Sheryl Cox [id number] Admission Data: 06/05/2013 5:38 PM Attending Provider: Mendel Corning, MD  UXN:ATFTD,DUKGU J, MD Consults/ Treatment Team:    Sheryl Cox is a 43 y.o. female patient admitted from ED awake, alert  & orientated  X 3,  Full Code, VSS - Blood pressure 148/98, pulse 99, temperature 99.2 F (37.3 C), temperature source Oral, resp. rate 20, height 5\' 3"  (1.6 m), weight 90.674 kg (199 lb 14.4 oz), SpO2 100.00%., no c/o shortness of breath, no c/o chest pain, no distress noted. Tele # 3 placed and pt is currently running:normal sinus rhythm.   IV site WDL:  antecubital Left  with a transparent dsg that's clean dry and intact.  Allergies:   Allergies  Allergen Reactions  . Shrimp [Shellfish Allergy] Anaphylaxis  . Shellfish-Derived Products Swelling     Past Medical History  Diagnosis Date  . Hypertension   . Iron deficiency   . Bronchiectasis   . Urinary tract infection   . Fibroid   . Chlamydia   . Anemia   . 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 Radiation 03/07/13-04/01/13    Right breast  50 Gy    History:  obtained from the patient. Tobacco/alcohol: denied social drinker  Pt orientation to unit, room and routine. Information packet given to patient/family and safety video watched.  Admission INP armband ID verified with patient/family, and in place. SR up x 2, fall risk assessment complete with Patient and family verbalizing understanding of risks associated with falls. Pt verbalizes an understanding of how to use the call bell and to call for help before getting out of bed.  Skin, clean-dry- intact without evidence of bruising, or skin tears.   No evidence of skin break down noted on exam. no rashes, no ecchymoses, no petechiae    Will cont to monitor and assist as needed.  Shenia Alan, Thornell Mule, RN 06/05/2013 5:38 PM

## 2013-06-05 NOTE — ED Provider Notes (Signed)
CSN: [id number]     Arrival date & time 06/05/13  1050 History   First MD Initiated Contact with Patient 06/05/13 1218     Chief Complaint  Patient presents with  . Anemia     (Consider location/radiation/quality/duration/timing/severity/associated sxs/prior Treatment) HPI Comments: Sheryl Cox is a 43 y.o. female with a past medical history of Breast cancer recently finished radiation therapy presenting the Emergency Department with a chief complaint of low hemoglobin. The patient reports she went to her PCP's office yesterday for evaluation of a sinus infection, she reports receiving a call today stating that her Hbg was 5.3.  She reports worsening weakness for one week.  LNMP: 05/26/2013, reports history of heavy menses. The patient also reports constipation with one episode of BRBPR on the toilet paper. Denies lightheadedness, dizziness, chest pain, or syncope.  PCP: [name], MD    The history is provided by the patient and medical records. No language interpreter was used.    Past Medical History  Diagnosis Date  . Hypertension   . Iron deficiency   . Bronchiectasis   . Urinary tract infection   . Fibroid   . Chlamydia   . Anemia   . 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 Radiation 03/07/13-04/01/13    Right breast  50 Gy   Past Surgical History  Procedure Laterality Date  . Cesarean section  01/05/2009  . Biopsy breast Left 12/05/12    fibroadenoma  . Breast surgery     Family History  Problem Relation Age of Onset  . Anesthesia problems Neg Hx   . Hypertension Mother   . Diabetes Mother   . Breast cancer Paternal Grandmother     dx in her 71s  . Prostate cancer Paternal Grandfather   . Prostate cancer Father 10  . Prostate cancer Paternal Uncle   . Breast cancer Other     paternal grandmother's sister   History  Substance Use Topics  . Smoking status: Never Smoker   . Smokeless tobacco: Never Used  . Alcohol Use: Yes     Comment: occa   OB  History   Grav Para Term Preterm Abortions TAB SAB Ect Mult Living   1 1 1  0 0 0 0 0 0 1     Review of Systems  Constitutional: Positive for fatigue. Negative for fever and chills.  Neurological: Positive for weakness and headaches. Negative for dizziness, syncope and light-headedness.      Allergies  Shrimp and Shellfish-derived products  Home Medications   Current Outpatient Rx  Name  Route  Sig  Dispense  Refill  . albuterol (PROVENTIL HFA;VENTOLIN HFA) 108 (90 BASE) MCG/ACT inhaler   Inhalation   Inhale 1-2 puffs into the lungs every 6 (six) hours as needed for wheezing or shortness of breath (rescue).         . Multiple Vitamins-Minerals (MULTIVITAMIN PO)   Oral   Take 1 tablet by mouth daily.         . Olmesartan-Amlodipine-HCTZ (TRIBENZOR) 20-5-12.5 MG TABS   Oral   Take 1 tablet by mouth every morning.   30 tablet   1   . Specialty Vitamins Products (ONE-A-DAY ENERGY FORMULA PO)   Oral   Take 1 tablet by mouth daily.         . tamoxifen (NOLVADEX) 10 MG tablet   Oral   Take 10 mg by mouth daily.          BP 141/86  Pulse 97  Temp(Src) 99.8 F (37.7 C) (Oral)  Resp 23  Ht 5\' 3"  (1.6 m)  Wt 199 lb 14.4 oz (90.674 kg)  BMI 35.42 kg/m2  SpO2 100% Physical Exam  Nursing note and vitals reviewed. Constitutional: She is oriented to person, place, and time. She appears well-developed and well-nourished. No distress.  HENT:  Head: Normocephalic and atraumatic.  Neck: Neck supple.  Cardiovascular: Regular rhythm.  Tachycardia present.   Murmur heard. Pulses:      Radial pulses are 2+ on the right side, and 2+ on the left side.  No lower extremity edema  Pulmonary/Chest: Effort normal and breath sounds normal. She has no wheezes. She has no rales.  Abdominal: Soft. There is no tenderness. There is no rebound.  Genitourinary: Rectal exam shows no external hemorrhoid and no internal hemorrhoid.  Tan stool on glove.  Stool in rectal vault.  No  obvious blood or melena. Chaperone present.  Neurological: She is alert and oriented to person, place, and time.  Skin: Skin is warm and dry. No rash noted. She is not diaphoretic.  Psychiatric: She has a normal mood and affect. Her behavior is normal.    ED Course  Procedures (including critical care time) Labs Review Labs Reviewed  CBC WITH DIFFERENTIAL - Abnormal; Notable for the following:    RBC 3.73 (*)    Hemoglobin 5.6 (*)    HCT 21.3 (*)    MCV 57.1 (*)    MCH 15.0 (*)    MCHC 26.3 (*)    RDW 21.7 (*)    All other components within normal limits  BASIC METABOLIC PANEL - Abnormal; Notable for the following:    Glucose, Bld 111 (*)    GFR calc non Af Amer 77 (*)    GFR calc Af Amer 89 (*)    All other components within normal limits  HEPATIC FUNCTION PANEL - Abnormal; Notable for the following:    Alkaline Phosphatase 25 (*)    All other components within normal limits  OCCULT BLOOD X 1 CARD TO LAB, STOOL  POC OCCULT BLOOD, ED  TYPE AND SCREEN  PREPARE RBC (CROSSMATCH)  ABO/RH   Imaging Review No results found.   EKG Interpretation   Date/Time:  Friday June 05 2013 13:26:39 EDT Ventricular Rate:  109 PR Interval:  143 QRS Duration: 81 QT Interval:  316 QTC Calculation: 425 R Axis:   62 Text Interpretation:  Sinus tachycardia Probable left atrial enlargement  Probable anteroseptal infarct, old since last tracing no significant  change Confirmed by BELFI  MD, MELANIE ([id number]) on 06/05/2013 4:59:23 PM      MDM   Final diagnoses:  Anemia  Tachycardia  History of breast cancer   Pt with breast cancer presents with Hbg 5.6, symptomatic.  Negative hemoccult.  Will transfuse in the ED. Pt to be admitted for more blood products. Discussed pt history and condition with Dr. Tana Coast, who agrees to admit the patient.    Lorrine Kin, PA-C 06/08/13 1227

## 2013-06-05 NOTE — Progress Notes (Signed)
Received report from Ocie Bob.

## 2013-06-05 NOTE — ED Notes (Signed)
Pt states she went to see her PCP Dr [name] yesterday for feeling "light and dizzy and my head was hurting.' she was called today and told to come to ED for low hemoglobin on labs yesterday. She is A&Ox4, resp e/u

## 2013-06-06 DIAGNOSIS — D0590 Unspecified type of carcinoma in situ of unspecified breast: Secondary | ICD-10-CM

## 2013-06-06 DIAGNOSIS — D509 Iron deficiency anemia, unspecified: Secondary | ICD-10-CM | POA: Diagnosis not present

## 2013-06-06 LAB — IRON AND TIBC
Iron: 56 ug/dL (ref 42–135)
SATURATION RATIOS: 11 % — AB (ref 20–55)
TIBC: 512 ug/dL — ABNORMAL HIGH (ref 250–470)
UIBC: 456 ug/dL — AB (ref 125–400)

## 2013-06-06 LAB — RETICULOCYTES
RBC.: 4.21 MIL/uL (ref 3.87–5.11)
RETIC CT PCT: 1 % (ref 0.4–3.1)
Retic Count, Absolute: 42.1 10*3/uL (ref 19.0–186.0)

## 2013-06-06 LAB — BASIC METABOLIC PANEL
BUN: 17 mg/dL (ref 6–23)
CO2: 24 meq/L (ref 19–32)
Calcium: 9 mg/dL (ref 8.4–10.5)
Chloride: 102 mEq/L (ref 96–112)
Creatinine, Ser: 0.92 mg/dL (ref 0.50–1.10)
GFR calc Af Amer: 87 mL/min — ABNORMAL LOW (ref >90)
GFR, EST NON AFRICAN AMERICAN: 75 mL/min — AB (ref >90)
GLUCOSE: 107 mg/dL — AB (ref 70–99)
Potassium: 3.8 mEq/L (ref 3.7–5.3)
SODIUM: 140 meq/L (ref 137–147)

## 2013-06-06 LAB — CBC
HCT: 26.6 % — ABNORMAL LOW (ref 36.0–46.0)
HEMOGLOBIN: 8 g/dL — AB (ref 12.0–15.0)
MCH: 19.2 pg — ABNORMAL LOW (ref 26.0–34.0)
MCHC: 30.1 g/dL (ref 30.0–36.0)
MCV: 63.9 fL — ABNORMAL LOW (ref 78.0–100.0)
Platelets: 325 10*3/uL (ref 150–400)
RBC: 4.16 MIL/uL (ref 3.87–5.11)
RDW: 27.9 % — ABNORMAL HIGH (ref 11.5–15.5)
WBC: 4.5 10*3/uL (ref 4.0–10.5)

## 2013-06-06 LAB — TYPE AND SCREEN
ABO/RH(D): O POS
ANTIBODY SCREEN: NEGATIVE
UNIT DIVISION: 0
Unit division: 0

## 2013-06-06 LAB — FERRITIN: FERRITIN: 2 ng/mL — AB (ref 10–291)

## 2013-06-06 LAB — VITAMIN B12: VITAMIN B 12: 502 pg/mL (ref 211–911)

## 2013-06-06 LAB — FOLATE: Folate: 15.2 ng/mL

## 2013-06-06 MED ORDER — SODIUM CHLORIDE 0.9 % IV SOLN
1000.0000 mg | Freq: Once | INTRAVENOUS | Status: AC
  Administered 2013-06-06: 1000 mg via INTRAVENOUS
  Filled 2013-06-06: qty 20

## 2013-06-06 MED ORDER — SODIUM CHLORIDE 0.9 % IV SOLN
25.0000 mg | Freq: Once | INTRAVENOUS | Status: AC
  Administered 2013-06-06: 25 mg via INTRAVENOUS
  Filled 2013-06-06: qty 0.5

## 2013-06-06 NOTE — Discharge Summary (Signed)
Physician Discharge Summary  Sheryl Cox:[id number] DOB: 1970/06/06 DOA: 06/05/2013  PCP: [name], MD  Admit date: 06/05/2013 Discharge date: 06/06/2013  Time spent: 82minutes  Recommendations for Outpatient Follow-up:  1. Followup with Dr. [name] for further evaluation for menorrhagia  Discharge Diagnoses:  Principal Problem:   Anemia Active Problems:   Fibroid uterus   Menorrhagia   Discharge Condition: Stable  Diet recommendation: Regular  Filed Weights   06/05/13 1101 06/05/13 1707  Weight: 90.674 kg (199 lb 14.4 oz) 90.674 kg (199 lb 14.4 oz)    History of present illness:  Patient is a 43 year old female with history of right breast cancer, recently finished radiation therapy, currently on tamoxifen, has a known history of uterine fibroids, hypertension, presented to the ER after called by her PCPs office. Per patient she was having upper respiratory symptoms when she had gone to PCPs office for regular followup. Labs were done and was called today as her hemoglobin was 5.3 with hematocrit of 12. Patient was having dizziness and lightheadedness, otherwise no chest pain or any significant shortness of breath.  Patient does endorse menorrhagia, lasting 5 days and uses about 6-7 pads. She has a known history of multiple uterine fibroids, follows with GYN, Dr. [name]. Patient reports that she was going to have surgery for uterine fibroids but has not scheduled it yet.  Hospital Course:   1. Anemia: Microcytic anemia, likely iron deficiency anemia from chronic bleeding. Patient has history of menorrhagia and she follows with Dr. [name] (local gynecologist). Iron/ferritin studies were pending at the time of discharge. The patient had transfusion of 2 units of packed RBCs. Because of the marked microcytosis with MCV of 57.1 iron infusion was done in the hospital.  2. Uterine fibroids/menorrhagia: Followup with Dr. [name] for further evaluation.  3. History of  breast cancer: Fall with Dr. Humphrey Rolls as outpatient, continue tamoxifen.  4. Hypertension: No changes done to all medications.  Procedures:  Transfusion of 2 units of packed RBCs.  Infusion of 1025 mg of InFeD  Consultations:  None  Discharge Exam: Filed Vitals:   06/06/13 0657  BP: 130/83  Pulse: 83  Temp: 98 F (36.7 C)  Resp: 20   General: Alert and awake, oriented x3, not in any acute distress. HEENT: anicteric sclera, pupils reactive to light and accommodation, EOMI CVS: S1-S2 clear, no murmur rubs or gallops Chest: clear to auscultation bilaterally, no wheezing, rales or rhonchi Abdomen: soft nontender, nondistended, normal bowel sounds, no organomegaly Extremities: no cyanosis, clubbing or edema noted bilaterally Neuro: Cranial nerves II-XII intact, no focal neurological deficits  Discharge Instructions  Discharge Orders   Future Appointments Provider Department Dept Phone   08/06/2013 2:00 PM [name], MD [facility] [phone]   Future Orders Complete By Expires   Increase activity slowly  As directed        Medication List         albuterol 108 (90 BASE) MCG/ACT inhaler  Commonly known as:  PROVENTIL HFA;VENTOLIN HFA  Inhale 1-2 puffs into the lungs every 6 (six) hours as needed for wheezing or shortness of breath (rescue).     MULTIVITAMIN PO  Take 1 tablet by mouth daily.     Olmesartan-Amlodipine-HCTZ 20-5-12.5 MG Tabs  Commonly known as:  TRIBENZOR  Take 1 tablet by mouth every morning.     ONE-A-DAY ENERGY FORMULA PO  Take 1 tablet by mouth daily.     tamoxifen 10 MG tablet  Commonly known  as:  NOLVADEX  Take 10 mg by mouth daily.       Allergies  Allergen Reactions  . Shrimp [Shellfish Allergy] Anaphylaxis  . Shellfish-Derived Products Swelling       Follow-up Information   Follow up with [name],[name], MD In 1 week.   Specialty:  Obstetrics and Gynecology   Contact information:   [address] [place] [address] [phone]        The results of significant diagnostics from this hospitalization (including imaging, microbiology, ancillary and laboratory) are listed below for reference.    Significant Diagnostic Studies: No results found.  Microbiology: No results found for this or any previous visit (from the past 240 hour(s)).   Labs: Basic Metabolic Panel:  Recent Labs Lab 06/05/13 1158 06/06/13 0121  NA 140 140  K 3.9 3.8  CL 101 102  CO2 23 24  GLUCOSE 111* 107*  BUN 15 17  CREATININE 0.90 0.92  CALCIUM 9.5 9.0   Liver Function Tests:  Recent Labs Lab 06/05/13 1158  AST 28  ALT 11  ALKPHOS 25*  BILITOT 0.4  PROT 7.6  ALBUMIN 4.0   No results found for this basename: LIPASE, AMYLASE,  in the last 168 hours No results found for this basename: AMMONIA,  in the last 168 hours CBC:  Recent Labs Lab 06/05/13 1158 06/06/13 0121  WBC 5.0 4.5  NEUTROABS 3.6  --   HGB 5.6* 8.0*  HCT 21.3* 26.6*  MCV 57.1* 63.9*  PLT 372 325   Cardiac Enzymes: No results found for this basename: CKTOTAL, CKMB, CKMBINDEX, TROPONINI,  in the last 168 hours BNP: BNP (last 3 results) No results found for this basename: PROBNP,  in the last 8760 hours CBG: No results found for this basename: GLUCAP,  in the last 168 hours     Signed:  [name] A  [facility] 06/06/2013, 12:26 PM

## 2013-06-06 NOTE — Progress Notes (Signed)
Nutrition-Brief Note  Requested by RN and patient to provide diet education handouts on weight loss and increasing fiber in her diet. Both handouts were provided to patient. No further nutrition intervention needed at this time.  Molli Barrows, RD, LDN, [place] Pager [phone] After Hours Pager [phone]

## 2013-06-08 NOTE — ED Provider Notes (Signed)
Medical screening examination/treatment/procedure(s) were performed by non-physician practitioner and as supervising physician I was immediately available for consultation/collaboration.   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

## 2013-06-08 NOTE — Care Management Note (Signed)
    Page 1 of 1   06/08/2013     4:06:49 PM   CARE MANAGEMENT NOTE 06/08/2013  Patient:  Sheryl Cox, Sheryl Cox   Account Number:  [id number]  Date Initiated:  06/08/2013  Documentation initiated by:  Tomi Bamberger  Subjective/Objective Assessment:   dx menorrhagia  admit as observation     Action/Plan:   Anticipated DC Date:  06/06/2013   Anticipated DC Plan:  [place]  CM consult  Follow-up appt scheduled      Choice offered to / List presented to:             Status of service:  Completed, signed off Medicare Important Message given?   (If response is "NO", the following Medicare IM given date fields will be blank) Date Medicare IM given:   Date Additional Medicare IM given:    Discharge Disposition:  HOME/SELF CARE  Per UR Regulation:  Reviewed for med. necessity/level of care/duration of stay  If discussed at [place] of Stay Meetings, dates discussed:    Comments:  06/08/13 [place], BSN [phone] f/u apt scheduled for Dr. [name] at 4/1 at 1 pm , NCM called patient and informed her of this appt.

## 2013-07-27 ENCOUNTER — Other Ambulatory Visit: Payer: Self-pay | Admitting: Obstetrics

## 2013-08-05 NOTE — Patient Instructions (Addendum)
   Your procedure is scheduled CB:ULAGTXMIW AT 1230PM  Enter through the Main Entrance of [facility] at:11am Pick up the phone at the desk and dial [phone] and inform us of your arrival.  Please call this number if you have any problems the morning of surgery: [phone]  Remember: Do not eat food after midnight: Do not drink clear liquids after: Take these medicines the morning of surgery with a SIP OF WATER:  Do not wear jewelry, make-up, or FINGER nail polish No metal in your hair or on your body. Do not wear lotions, powders, perfumes.  You may wear deodorant.  Do not bring valuables to the hospital. Contacts, dentures or bridgework may not be worn into surgery.  Leave suitcase in the car. After Surgery it may be brought to your room. For patients being admitted to the hospital, checkout time is 11:00am the day of discharge.    Patients discharged on the day of surgery will not be allowed to drive home.

## 2013-08-06 ENCOUNTER — Encounter (HOSPITAL_COMMUNITY)
Admission: RE | Admit: 2013-08-06 | Discharge: 2013-08-06 | Disposition: A | Discharge status: Home / Self Care | Payer: BC Managed Care – PPO | Source: Ambulatory Visit | Attending: Obstetrics | Admitting: Obstetrics

## 2013-08-06 ENCOUNTER — Ambulatory Visit: Payer: BC Managed Care – PPO | Admitting: Radiation Oncology

## 2013-08-06 ENCOUNTER — Encounter (HOSPITAL_COMMUNITY): Payer: Self-pay

## 2013-08-06 DIAGNOSIS — Z01812 Encounter for preprocedural laboratory examination: Secondary | ICD-10-CM | POA: Insufficient documentation

## 2013-08-06 LAB — BASIC METABOLIC PANEL
BUN: 14 mg/dL (ref 6–23)
CHLORIDE: 102 meq/L (ref 96–112)
CO2: 26 mEq/L (ref 19–32)
CREATININE: 0.67 mg/dL (ref 0.50–1.10)
Calcium: 8.9 mg/dL (ref 8.4–10.5)
GFR calc non Af Amer: >90 mL/min (ref >90)
GLUCOSE: 99 mg/dL (ref 70–99)
POTASSIUM: 4.1 meq/L (ref 3.7–5.3)
Sodium: 139 mEq/L (ref 137–147)

## 2013-08-06 LAB — CBC
HEMATOCRIT: 33.5 % — AB (ref 36.0–46.0)
HEMOGLOBIN: 10.4 g/dL — AB (ref 12.0–15.0)
MCH: 24.2 pg — ABNORMAL LOW (ref 26.0–34.0)
MCHC: 31 g/dL (ref 30.0–36.0)
MCV: 78.1 fL (ref 78.0–100.0)
Platelets: 318 10*3/uL (ref 150–400)
RBC: 4.29 MIL/uL (ref 3.87–5.11)
RDW: 18.4 % — AB (ref 11.5–15.5)
WBC: 5.4 10*3/uL (ref 4.0–10.5)

## 2013-08-10 NOTE — H&P (Signed)
NAMERIVERS, GASSMANN NO.:  [id number]  MEDICAL RECORD NO.:  [id number]  LOCATION:  [place]                           FACILITY:  [facility]  PHYSICIAN:  Frederico Hamman, M.D.DATE OF BIRTH:  05/06/1970  DATE OF ADMISSION:  08/06/2013 DATE OF DISCHARGE:  08/06/2013                             HISTORY & PHYSICAL   HISTORY OF PRESENT ILLNESS:  The patient is a 43 year old, gravida 1, para 1-0-0-1, who has a history of heavy periods.  Hemoglobin 8.9, and desires HD ablation.  PAST MEDICAL HISTORY:  The patient had breast cancer in 2014 intraductal and she had a breast reduction.  She had radiation therapy.  She is on tamoxifen for the next 10 years.  She is on iron.  PAST SURGICAL HISTORY:  Lumpectomy for breast cancer in 2014.  SOCIAL HISTORY:  Negative.  SYSTEM REVIEW:  Noncontributory.  PHYSICAL EXAMINATION:  GENERAL:  Well-developed female in no distress. HEENT:  Negative. LUNGS:  Clear to P and A. HEART:  Regular rhythm.  No murmurs, no gallops. BREASTS:  Post surgery breast reduction and surgery for cancer. ABDOMEN:  Negative.  Uterus normal size, negative adnexa. EXTREMITIES:  Negative.          ______________________________ Frederico Hamman, M.D.     BAM/MEDQ  D:  08/10/2013  T:  08/10/2013  Job:  [id number]

## 2013-08-12 ENCOUNTER — Encounter (HOSPITAL_COMMUNITY)
Admission: RE | Disposition: A | Discharge status: Home / Self Care | Payer: Self-pay | Source: Ambulatory Visit | Attending: Obstetrics

## 2013-08-12 ENCOUNTER — Ambulatory Visit (HOSPITAL_COMMUNITY): Payer: BC Managed Care – PPO | Admitting: Anesthesiology

## 2013-08-12 ENCOUNTER — Ambulatory Visit (HOSPITAL_COMMUNITY)
Admission: RE | Admit: 2013-08-12 | Discharge: 2013-08-12 | Disposition: A | Discharge status: Home / Self Care | Payer: BC Managed Care – PPO | Source: Ambulatory Visit | Attending: Obstetrics | Admitting: Obstetrics

## 2013-08-12 ENCOUNTER — Encounter (HOSPITAL_COMMUNITY): Payer: BC Managed Care – PPO | Admitting: Anesthesiology

## 2013-08-12 DIAGNOSIS — N949 Unspecified condition associated with female genital organs and menstrual cycle: Secondary | ICD-10-CM | POA: Insufficient documentation

## 2013-08-12 DIAGNOSIS — N925 Other specified irregular menstruation: Secondary | ICD-10-CM | POA: Insufficient documentation

## 2013-08-12 DIAGNOSIS — I10 Essential (primary) hypertension: Secondary | ICD-10-CM | POA: Insufficient documentation

## 2013-08-12 DIAGNOSIS — Z6837 Body mass index (BMI) 37.0-37.9, adult: Secondary | ICD-10-CM | POA: Insufficient documentation

## 2013-08-12 DIAGNOSIS — D649 Anemia, unspecified: Secondary | ICD-10-CM | POA: Insufficient documentation

## 2013-08-12 DIAGNOSIS — N938 Other specified abnormal uterine and vaginal bleeding: Secondary | ICD-10-CM | POA: Insufficient documentation

## 2013-08-12 HISTORY — PX: DILITATION & CURRETTAGE/HYSTROSCOPY WITH HYDROTHERMAL ABLATION: SHX5570

## 2013-08-12 SURGERY — DILATATION & CURETTAGE/HYSTEROSCOPY WITH HYDROTHERMAL ABLATION
Anesthesia: General | Site: Uterus

## 2013-08-12 MED ORDER — FENTANYL CITRATE 0.05 MG/ML IJ SOLN
25.0000 ug | INTRAMUSCULAR | Status: DC | PRN
  Administered 2013-08-12: 50 ug via INTRAVENOUS

## 2013-08-12 MED ORDER — LIDOCAINE HCL 1 % IJ SOLN
INTRAMUSCULAR | Status: DC | PRN
  Administered 2013-08-12: 10 mL

## 2013-08-12 MED ORDER — MIDAZOLAM HCL 2 MG/2ML IJ SOLN
INTRAMUSCULAR | Status: AC
  Filled 2013-08-12: qty 2

## 2013-08-12 MED ORDER — LACTATED RINGERS IV SOLN
INTRAVENOUS | Status: DC
  Administered 2013-08-12 (×2): via INTRAVENOUS

## 2013-08-12 MED ORDER — LIDOCAINE HCL (CARDIAC) 20 MG/ML IV SOLN
INTRAVENOUS | Status: AC
  Filled 2013-08-12: qty 5

## 2013-08-12 MED ORDER — MIDAZOLAM HCL 2 MG/2ML IJ SOLN
INTRAMUSCULAR | Status: DC | PRN
  Administered 2013-08-12: 2 mg via INTRAVENOUS

## 2013-08-12 MED ORDER — PROPOFOL 10 MG/ML IV EMUL
INTRAVENOUS | Status: AC
  Filled 2013-08-12: qty 20

## 2013-08-12 MED ORDER — KETOROLAC TROMETHAMINE 30 MG/ML IJ SOLN: 15.0000 mg | Freq: Once | INTRAMUSCULAR | Status: DC | PRN

## 2013-08-12 MED ORDER — KETOROLAC TROMETHAMINE 30 MG/ML IJ SOLN
INTRAMUSCULAR | Status: DC | PRN
  Administered 2013-08-12: 30 mg via INTRAVENOUS

## 2013-08-12 MED ORDER — KETOROLAC TROMETHAMINE 30 MG/ML IJ SOLN
INTRAMUSCULAR | Status: AC
  Filled 2013-08-12: qty 1

## 2013-08-12 MED ORDER — PROMETHAZINE HCL 25 MG/ML IJ SOLN: 6.2500 mg | INTRAMUSCULAR | Status: DC | PRN

## 2013-08-12 MED ORDER — PROPOFOL 10 MG/ML IV BOLUS
INTRAVENOUS | Status: DC | PRN
  Administered 2013-08-12: 200 mg via INTRAVENOUS

## 2013-08-12 MED ORDER — FENTANYL CITRATE 0.05 MG/ML IJ SOLN
INTRAMUSCULAR | Status: AC
  Filled 2013-08-12: qty 2

## 2013-08-12 MED ORDER — OXYCODONE-ACETAMINOPHEN 5-325 MG PO TABS
1.0000 | ORAL_TABLET | ORAL | Status: DC | PRN
  Administered 2013-08-12: 1 via ORAL

## 2013-08-12 MED ORDER — LIDOCAINE HCL (CARDIAC) 20 MG/ML IV SOLN
INTRAVENOUS | Status: DC | PRN
  Administered 2013-08-12: 50 mg via INTRAVENOUS

## 2013-08-12 MED ORDER — MEPERIDINE HCL 25 MG/ML IJ SOLN
6.2500 mg | INTRAMUSCULAR | Status: DC | PRN
  Administered 2013-08-12: 12.5 mg via INTRAVENOUS

## 2013-08-12 MED ORDER — OXYCODONE-ACETAMINOPHEN 5-325 MG PO TABS
ORAL_TABLET | ORAL | Status: AC
  Filled 2013-08-12: qty 1

## 2013-08-12 MED ORDER — FENTANYL CITRATE 0.05 MG/ML IJ SOLN
INTRAMUSCULAR | Status: AC
  Filled 2013-08-12: qty 5

## 2013-08-12 MED ORDER — ONDANSETRON HCL 4 MG/2ML IJ SOLN
INTRAMUSCULAR | Status: DC | PRN
  Administered 2013-08-12: 4 mg via INTRAVENOUS

## 2013-08-12 MED ORDER — MEPERIDINE HCL 25 MG/ML IJ SOLN
INTRAMUSCULAR | Status: AC
  Filled 2013-08-12: qty 1

## 2013-08-12 MED ORDER — ONDANSETRON HCL 4 MG/2ML IJ SOLN
INTRAMUSCULAR | Status: AC
  Filled 2013-08-12: qty 2

## 2013-08-12 MED ORDER — LIDOCAINE HCL 1 % IJ SOLN
INTRAMUSCULAR | Status: AC
  Filled 2013-08-12: qty 20

## 2013-08-12 MED ORDER — MIDAZOLAM HCL 2 MG/2ML IJ SOLN: 0.5000 mg | Freq: Once | INTRAMUSCULAR | Status: DC | PRN

## 2013-08-12 MED ORDER — FENTANYL CITRATE 0.05 MG/ML IJ SOLN
INTRAMUSCULAR | Status: DC | PRN
  Administered 2013-08-12 (×3): 50 ug via INTRAVENOUS
  Administered 2013-08-12 (×2): 25 ug via INTRAVENOUS
  Administered 2013-08-12: 50 ug via INTRAVENOUS

## 2013-08-12 MED ORDER — SODIUM CHLORIDE 0.9 % IR SOLN
Status: DC | PRN
  Administered 2013-08-12: 3000 mL

## 2013-08-12 SURGICAL SUPPLY — 16 items
CATH ROBINSON RED A/P 16FR (CATHETERS) ×3 IMPLANT
CLOTH BEACON ORANGE TIMEOUT ST (SAFETY) ×3 IMPLANT
CONTAINER PREFILL 10% NBF 60ML (FORM) ×6 IMPLANT
DRAPE HYSTEROSCOPY (DRAPE) ×3 IMPLANT
DRSG TELFA 3X8 NADH (GAUZE/BANDAGES/DRESSINGS) ×3 IMPLANT
GLOVE BIO SURGEON STRL SZ8.5 (GLOVE) ×3 IMPLANT
GOWN STRL REUS W/TWL 2XL LVL3 (GOWN DISPOSABLE) ×3 IMPLANT
GOWN STRL REUS W/TWL LRG LVL3 (GOWN DISPOSABLE) ×3 IMPLANT
NDL SPNL 22GX3.5 QUINCKE BK (NEEDLE) ×2 IMPLANT
NEEDLE SPNL 22GX3.5 QUINCKE BK (NEEDLE) ×6 IMPLANT
PACK VAGINAL MINOR WOMEN LF (CUSTOM PROCEDURE TRAY) ×3 IMPLANT
PAD DRESSING TELFA 3X8 NADH (GAUZE/BANDAGES/DRESSINGS) ×1 IMPLANT
PAD OB MATERNITY 4.3X12.25 (PERSONAL CARE ITEMS) ×3 IMPLANT
SET GENESYS HTA PROCERVA (MISCELLANEOUS) ×3 IMPLANT
SYR CONTROL 10ML LL (SYRINGE) ×6 IMPLANT
TOWEL OR 17X24 6PK STRL BLUE (TOWEL DISPOSABLE) ×6 IMPLANT

## 2013-08-12 NOTE — Discharge Instructions (Signed)
D&C Hysterosocpy Care After Read the instructions below. Refer to this sheet in the next few weeks. These instructions provide you with general information on caring for yourself after you leave the hospital. Your caregiver may also give you specific instructions.  D&C or vacuum curettage is a minor operation. A D&C involves the stretching (dilatation) of the cervix and scraping (curettage) of the inside lining of the uterus. A vacuum curettage gently sucks out the lining and tissue in the uterus with a tube. You may have light cramping and bleeding for a couple of days to two weeks after the procedure. This procedure may be done in a hospital, outpatient clinic, or doctor's office. You may be given a drug to make you sleep (general anesthetic) or a drug that numbs the area (local anesthetic) in and around the cervix. HOME CARE INSTRUCTIONS  Do not drive for 24 hours.   Wait one week before returning to strenuous activities.   Take your temperature two times a day for 4 days and write it down. Provide these temperatures to your caregiver if they are abnormal (above 98.6 F or 37.0 C).   Avoid long periods of standing, and do no heavy lifting (more than 10 pounds), pushing or pulling.   Limit stair climbing to once or twice a day.   Take rest periods often.   You may resume your usual diet.   Drink plenty of fluids (6-8 glasses a day).   You should return to your usual bowel function. If constipation should occur, you may:   Take a mild laxative with permission from your caregiver.   Add fruit and bran to your diet.   Drink more fluids. This helps with constipation.   Take showers instead of baths until your caregiver gives you permission to take baths.   Do not go swimming or use a hot tub until your caregiver gives you permission.   Try to have someone with you or available for you the first 24 to 48 hours, especially if you had a general anesthetic.   Do not douche, use  tampons, or have intercourse until after your follow-up appointment, or when your caregiver approves.   Only take over-the-counter or prescription medicines for pain, discomfort, or fever as directed by your caregiver. Do not take aspirin. It can cause bleeding.   If a prescription was given, follow your caregiver's directions. You may be given a medicine that kills germs (antibiotic) to prevent an infection.   Keep all your follow-up appointments recommended by your caregiver.  SEEK MEDICAL CARE IF:  You have increasing cramps or pain not relieved with medication.   You develop belly (abdominal) pain which does not seem to be related to the same area of earlier cramping and pain.   You feel dizzy or feel like fainting.   You have bad smelling vaginal discharge.   You develop a rash.   You develop a reaction or allergy to your medication.  SEEK IMMEDIATE MEDICAL CARE IF:  Bleeding is heavier than a normal menstrual period.   You have an oral temperature above 100.6, not controlled by medicine.   You develop chest pain.   You develop shortness of breath.   You pass out.   You develop pain in your shoulder strap area.   You develop heavy vaginal bleeding with or without blood clots.  MAKE SURE YOU:   Understand these instructions.   Will watch your condition.   Will get help right away if you are  not doing well or get worse.  UPDATED HEALTH PRACTICES  A PAP smear is done to screen for cervical cancer.   The first PAP smear should be done at age 39.   Between ages 73 and 32, PAP smears are repeated every 2 years.   Beginning at age 36, you are advised to have a PAP smear every 3 years as long as your past 3 PAP smears have been normal.   Some women have medical problems that increase the chance of getting cervical cancer. Talk to your caregiver about these problems. It is especially important to talk to your caregiver if a new problem develops soon after your last PAP  smear. In these cases, your caregiver may recommend more frequent screening and Pap smears.   The above recommendations are the same for women who have or have not gotten the vaccine for HPV (Human Papillomavirus).   If you had a hysterectomy for a problem that was not a cancer or a condition that could lead to cancer, then you no longer need Pap smears.   If you are between ages 74 and 64, and you have had normal Pap smears going back 10 years, you no longer need Pap smears.   If you have had past treatment for cervical cancer or a condition that could lead to cancer, you need Pap smears and screening for cancer for at least 20 years after your treatment.   Continue monthly self-breast examinations. Your caregiver can provide information and instructions for self-breast examination.  Document Released: 03/02/2000 Document Re-Released: 08/23/2009 [facility] Patient Information 2011 [place].DISCHARGE INSTRUCTIONS: HYSTEROSCOPY / ENDOMETRIAL ABLATION The following instructions have been prepared to help you care for yourself upon your return home.  Personal hygiene:  Use sanitary pads for vaginal drainage, not tampons.  Shower the day after your procedure.  NO tub baths, pools or Jacuzzis for 2-3 weeks.  Wipe front to back after using the bathroom.  Activity and limitations:  Do NOT drive or operate any equipment for 24 hours. The effects of anesthesia are still present and drowsiness may result.  Do NOT rest in bed all day.  Walking is encouraged.  Walk up and down stairs slowly.  You may resume your normal activity in one to two days or as indicated by your physician. Sexual activity: NO intercourse for at least 2 weeks after the procedure, or as indicated by your Doctor.  Diet: Eat a light meal as desired this evening. You may resume your usual diet tomorrow.  Return to Work: You may resume your work activities in one to two days or as indicated by  Marine scientist.  What to expect after your surgery: Expect to have vaginal bleeding/discharge for 2-3 days and spotting for up to 10 days. It is not unusual to have soreness for up to 1-2 weeks. You may have a slight burning sensation when you urinate for the first day. Mild cramps may continue for a couple of days. You may have a regular period in 2-6 weeks.  Call your doctor for any of the following:  Excessive vaginal bleeding or clotting, saturating and changing one pad every hour.  Inability to urinate 6 hours after discharge from hospital.  Pain not relieved by pain medication.  Fever of 100.4 F or greater.  Unusual vaginal discharge or odor.  Return to office _________________Call for an appointment ___________________ Patients signature: ______________________ Nurses signature ________________________  [place] [facility] [phone]

## 2013-08-12 NOTE — H&P (Signed)
There has been no change in the history and physical since the region of dictation

## 2013-08-12 NOTE — Transfer of Care (Signed)
Immediate Anesthesia Transfer of Care Note  Patient: Sheryl Cox  Procedure(s) Performed: Procedure(s): DILATATION & CURETTAGE/HYSTEROSCOPY WITH HYDROTHERMAL ABLATION (N/A)  Patient Location: PACU  Anesthesia Type:General  Level of Consciousness: awake, alert  and oriented  Airway & Oxygen Therapy: Patient Spontanous Breathing and Patient connected to nasal cannula oxygen  Post-op Assessment: Report given to PACU RN and Post -op Vital signs reviewed and stable  Post vital signs: Reviewed and stable  Complications: No apparent anesthesia complications

## 2013-08-12 NOTE — Anesthesia Preprocedure Evaluation (Signed)
Anesthesia Evaluation  Patient identified by MRN, date of birth, ID band Patient awake    Reviewed: Allergy & Precautions, H&P , Patient's Chart, lab work & pertinent test results, reviewed documented beta blocker date and time   History of Anesthesia Complications Negative for: history of anesthetic complications  Airway Mallampati: III TM Distance: >3 FB Neck ROM: full    Dental   Pulmonary  breath sounds clear to auscultation        Cardiovascular Exercise Tolerance: Good hypertension, Rhythm:regular Rate:Normal     Neuro/Psych negative psych ROS   GI/Hepatic   Endo/Other  Morbid obesity  Renal/GU      Musculoskeletal   Abdominal   Peds  Hematology  (+) anemia ,   Anesthesia Other Findings bronchiectasis  Reproductive/Obstetrics                           Anesthesia Physical Anesthesia Plan  ASA: III  Anesthesia Plan: General LMA   Post-op Pain Management:    Induction:   Airway Management Planned:   Additional Equipment:   Intra-op Plan:   Post-operative Plan:   Informed Consent: I have reviewed the patients History and Physical, chart, labs and discussed the procedure including the risks, benefits and alternatives for the proposed anesthesia with the patient or authorized representative who has indicated his/her understanding and acceptance.   Dental Advisory Given  Plan Discussed with: CRNA, Surgeon and Anesthesiologist  Anesthesia Plan Comments:         Anesthesia Quick Evaluation

## 2013-08-12 NOTE — Op Note (Signed)
Preop diagnosis dysfunctional uterine bleeding postop diagnosis Stinting Surgeon Dr. Gracy Racer Anesthesia general Hysteroscopy D&C and HTA ablation procedure Procedure on the general anesthesia patient in the lithotomy position abdomen perineum and vagina prepped and draped bimanual exam revealed 14 week size fibroids speculum placed in the vagina cervix injected with 10 cc of 1% Xylocaine anterior lip of the cervix grasped with tenaculum endometrial cavity sounded 10 cm cervix dilated # 25 [place] and   hysteroscope inserted and the cavity was noted to small 2 small polyps otherwise negative  Hysteroscope  removed and a sharp curettage performed a large large amount of tissue obtained then the HTA device was inserted and there was 2 minutes   Fluid warm up to 90 degrees  centigrade at 10 minutes ablation. 2 minutes: cool down  was total ablation to the cavity patient tolerated the procedure well taken to recovery room in good condition thank you

## 2013-08-12 NOTE — OR Nursing (Signed)
Dr. [name] states no pregnancy test is required before surgical procedure of HTA ablation and D and C hysteroscopy.

## 2013-08-12 NOTE — Anesthesia Postprocedure Evaluation (Signed)
Anesthesia Post Note  Patient: Sheryl Cox  Procedure(s) Performed: Procedure(s) (LRB): DILATATION & CURETTAGE/HYSTEROSCOPY WITH HYDROTHERMAL ABLATION (N/A)  Anesthesia type: GA  Patient location: PACU  Post pain: Pain level controlled  Post assessment: Post-op Vital signs reviewed  Last Vitals:  Filed Vitals:   08/12/13 1315  BP: 132/76  Pulse: 88  Temp:   Resp: 18    Post vital signs: Reviewed  Level of consciousness: sedated  Complications: No apparent anesthesia complications

## 2013-08-13 ENCOUNTER — Encounter (HOSPITAL_COMMUNITY): Payer: Self-pay | Admitting: Obstetrics

## 2013-08-20 ENCOUNTER — Encounter: Payer: Self-pay | Admitting: Radiation Oncology

## 2013-08-20 NOTE — Progress Notes (Signed)
08/20/13 8:20am - spoke to [employer] with promise to pay

## 2013-08-27 ENCOUNTER — Ambulatory Visit
Admission: RE | Admit: 2013-08-27 | Discharge: 2013-08-27 | Disposition: A | Discharge status: Home / Self Care | Payer: BC Managed Care – PPO | Source: Ambulatory Visit | Attending: Radiation Oncology | Admitting: Radiation Oncology

## 2013-08-27 VITALS — BP 148/88 | HR 105 | Temp 98.9°F | Wt 212.9 lb

## 2013-08-27 DIAGNOSIS — C50411 Malignant neoplasm of upper-outer quadrant of right female breast: Secondary | ICD-10-CM

## 2013-08-27 NOTE — Progress Notes (Signed)
Follow up completion of radiation to right breast on 04/01/13.Had mammogram of right breast on 07/24/13 with no suspicious findings, to repeat bilateral mammogram in 6 months.patient had hysteroscopy(D&C/ablation) on 08/12/13 for Hgb of 5.3.Has tenderness of right mammary fold.Patient is taking tamoxifen.

## 2013-08-28 NOTE — Progress Notes (Signed)
   Department of Radiation Oncology  Phone:  [phone] Fax:        [phone]   Name: Sheryl Cox MRN: [id number]  DOB: 25-Oct-1970  Date: 08/27/2013  Follow Up Visit Note  Diagnosis: T1aN0 Right breast cancer  Summary and Interval since last radiation: 50 Gy to the right breast completed 05/02/13  Interval History: Sheryl Cox presents today for routine followup.  She had a D&C for uterine bleeding. Her HgB was 5.3!  She feels much better following this. 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 She has some tenderness. She is followed by Dr. Alvan Dame and by her plastic surgeon at [facility]. She had a negative mammogram there 07/24/13. She is taking her tamoxifen. Her follow up appointments with medical oncology have been cancelled and she is waiting to reschedule.   Allergies:  Allergies  Allergen Reactions  . Shrimp [Shellfish Allergy] Anaphylaxis  . Shellfish-Derived Products Swelling    Medications:  Current Outpatient Prescriptions  Medication Sig Dispense Refill  . albuterol (PROVENTIL HFA;VENTOLIN HFA) 108 (90 BASE) MCG/ACT inhaler Inhale 1-2 puffs into the lungs every 6 (six) hours as needed for wheezing or shortness of breath (rescue).      . ferrous sulfate 325 (65 FE) MG tablet Take 325 mg by mouth daily with breakfast.      . Multiple Vitamins-Minerals (MULTIVITAMIN PO) Take 1 tablet by mouth daily.      . Olmesartan-Amlodipine-HCTZ (TRIBENZOR) 20-5-12.5 MG TABS Take 1 tablet by mouth every morning.  30 tablet  1  . SPECIALTY VITAMINS PRODUCTS PO Take 1 capsule by mouth daily. One-A-Day      . tamoxifen (NOLVADEX) 10 MG tablet Take 10 mg by mouth daily.       No current facility-administered medications for this encounter.    Physical Exam:  Filed Vitals:   08/27/13 1553  BP: 148/88  Pulse: 105  Temp: 98.9 F (37.2 C)  Weight: 212 lb 14.4 oz (96.571 kg)  SpO2: 100%  Hyperpigmentation of the right breast. Ropey scar  tissue along the inframammary scar on the right. No palpable abnormalities on the left.   IMPRESSION: Sheryl Cox is a 43 y.o. female s/p breast conservation currently NED.  PLAN:   1) Discussed need for yearly mammograms. 2) Continue tamoxifen 3) Will ask to refer to new breast provider.  4) Follow up with me in 3 months to ensure she has follow up with medical oncology.      [name], MD

## 2013-09-02 ENCOUNTER — Encounter: Payer: Self-pay | Admitting: *Deleted

## 2013-09-03 ENCOUNTER — Telehealth: Payer: Self-pay | Admitting: Hematology and Oncology

## 2013-09-03 NOTE — Telephone Encounter (Signed)
, °

## 2013-11-04 ENCOUNTER — Telehealth: Payer: Self-pay | Admitting: Hematology and Oncology

## 2013-11-04 ENCOUNTER — Ambulatory Visit: Payer: Self-pay | Admitting: Hematology and Oncology

## 2013-11-04 NOTE — Telephone Encounter (Signed)
, °

## 2013-11-05 ENCOUNTER — Inpatient Hospital Stay (HOSPITAL_COMMUNITY): Payer: BC Managed Care – PPO

## 2013-11-05 ENCOUNTER — Encounter (HOSPITAL_COMMUNITY): Payer: Self-pay | Admitting: *Deleted

## 2013-11-05 ENCOUNTER — Inpatient Hospital Stay (HOSPITAL_COMMUNITY)
Admission: AD | Admit: 2013-11-05 | Discharge: 2013-11-05 | Disposition: A | Discharge status: Home / Self Care | Payer: BC Managed Care – PPO | Source: Ambulatory Visit | Attending: Obstetrics | Admitting: Obstetrics

## 2013-11-05 DIAGNOSIS — D259 Leiomyoma of uterus, unspecified: Secondary | ICD-10-CM | POA: Insufficient documentation

## 2013-11-05 DIAGNOSIS — Z853 Personal history of malignant neoplasm of breast: Secondary | ICD-10-CM | POA: Diagnosis not present

## 2013-11-05 DIAGNOSIS — N949 Unspecified condition associated with female genital organs and menstrual cycle: Secondary | ICD-10-CM | POA: Diagnosis not present

## 2013-11-05 DIAGNOSIS — R109 Unspecified abdominal pain: Secondary | ICD-10-CM | POA: Insufficient documentation

## 2013-11-05 DIAGNOSIS — I10 Essential (primary) hypertension: Secondary | ICD-10-CM | POA: Insufficient documentation

## 2013-11-05 DIAGNOSIS — R1030 Lower abdominal pain, unspecified: Secondary | ICD-10-CM

## 2013-11-05 LAB — CBC WITH DIFFERENTIAL/PLATELET
BASOS ABS: 0 10*3/uL (ref 0.0–0.1)
BASOS PCT: 0 % (ref 0–1)
Eosinophils Absolute: 0.1 10*3/uL (ref 0.0–0.7)
Eosinophils Relative: 1 % (ref 0–5)
HEMATOCRIT: 35 % — AB (ref 36.0–46.0)
HEMOGLOBIN: 11.4 g/dL — AB (ref 12.0–15.0)
Lymphocytes Relative: 20 % (ref 12–46)
Lymphs Abs: 1.1 10*3/uL (ref 0.7–4.0)
MCH: 23.8 pg — ABNORMAL LOW (ref 26.0–34.0)
MCHC: 32.6 g/dL (ref 30.0–36.0)
MCV: 73.2 fL — ABNORMAL LOW (ref 78.0–100.0)
MONOS PCT: 8 % (ref 3–12)
Monocytes Absolute: 0.4 10*3/uL (ref 0.1–1.0)
NEUTROS ABS: 3.8 10*3/uL (ref 1.7–7.7)
NEUTROS PCT: 71 % (ref 43–77)
Platelets: 189 10*3/uL (ref 150–400)
RBC: 4.78 MIL/uL (ref 3.87–5.11)
RDW: 17.4 % — AB (ref 11.5–15.5)
WBC: 5.3 10*3/uL (ref 4.0–10.5)

## 2013-11-05 LAB — WET PREP, GENITAL
Clue Cells Wet Prep HPF POC: NONE SEEN
TRICH WET PREP: NONE SEEN
Yeast Wet Prep HPF POC: NONE SEEN

## 2013-11-05 LAB — URINALYSIS, ROUTINE W REFLEX MICROSCOPIC
Bilirubin Urine: NEGATIVE
GLUCOSE, UA: NEGATIVE mg/dL
HGB URINE DIPSTICK: NEGATIVE
Ketones, ur: NEGATIVE mg/dL
Leukocytes, UA: NEGATIVE
Nitrite: NEGATIVE
PROTEIN: NEGATIVE mg/dL
SPECIFIC GRAVITY, URINE: 1.02 (ref 1.005–1.030)
Urobilinogen, UA: 0.2 mg/dL (ref 0.0–1.0)
pH: 6 (ref 5.0–8.0)

## 2013-11-05 LAB — POCT PREGNANCY, URINE: PREG TEST UR: NEGATIVE

## 2013-11-05 MED ORDER — IBUPROFEN 600 MG PO TABS: 600.0000 mg | ORAL_TABLET | Freq: Four times a day (QID) | ORAL | Status: DC | PRN

## 2013-11-05 NOTE — Discharge Instructions (Signed)
Abdominal Pain, Women °Abdominal (stomach, pelvic, or belly) pain can be caused by many things. It is important to tell your doctor: °· The location of the pain. °· Does it come and go or is it present all the time? °· Are there things that start the pain (eating certain foods, exercise)? °· Are there other symptoms associated with the pain (fever, nausea, vomiting, diarrhea)? °All of this is helpful to know when trying to find the cause of the pain. °CAUSES  °· Stomach: virus or bacteria infection, or ulcer. °· Intestine: appendicitis (inflamed appendix), regional ileitis (Crohn's disease), ulcerative colitis (inflamed colon), irritable bowel syndrome, diverticulitis (inflamed diverticulum of the colon), or cancer of the stomach or intestine. °· Gallbladder disease or stones in the gallbladder. °· Kidney disease, kidney stones, or infection. °· Pancreas infection or cancer. °· Fibromyalgia (pain disorder). °· Diseases of the female organs: °¨ Uterus: fibroid (non-cancerous) tumors or infection. °¨ Fallopian tubes: infection or tubal pregnancy. °¨ Ovary: cysts or tumors. °¨ Pelvic adhesions (scar tissue). °¨ Endometriosis (uterus lining tissue growing in the pelvis and on the pelvic organs). °¨ Pelvic congestion syndrome (female organs filling up with blood just before the menstrual period). °¨ Pain with the menstrual period. °¨ Pain with ovulation (producing an egg). °¨ Pain with an IUD (intrauterine device, birth control) in the uterus. °¨ Cancer of the female organs. °· Functional pain (pain not caused by a disease, may improve without treatment). °· Psychological pain. °· Depression. °DIAGNOSIS  °Your doctor will decide the seriousness of your pain by doing an examination. °· Blood tests. °· X-rays. °· Ultrasound. °· CT scan (computed tomography, special type of X-ray). °· MRI (magnetic resonance imaging). °· Cultures, for infection. °· Barium enema (dye inserted in the large intestine, to better view it with  X-rays). °· Colonoscopy (looking in intestine with a lighted tube). °· Laparoscopy (minor surgery, looking in abdomen with a lighted tube). °· Major abdominal exploratory surgery (looking in abdomen with a large incision). °TREATMENT  °The treatment will depend on the cause of the pain.  °· Many cases can be observed and treated at home. °· Over-the-counter medicines recommended by your caregiver. °· Prescription medicine. °· Antibiotics, for infection. °· Birth control pills, for painful periods or for ovulation pain. °· Hormone treatment, for endometriosis. °· Nerve blocking injections. °· Physical therapy. °· Antidepressants. °· Counseling with a psychologist or psychiatrist. °· Minor or major surgery. °HOME CARE INSTRUCTIONS  °· Do not take laxatives, unless directed by your caregiver. °· Take over-the-counter pain medicine only if ordered by your caregiver. Do not take aspirin because it can cause an upset stomach or bleeding. °· Try a clear liquid diet (broth or water) as ordered by your caregiver. Slowly move to a bland diet, as tolerated, if the pain is related to the stomach or intestine. °· Have a thermometer and take your temperature several times a day, and record it. °· Bed rest and sleep, if it helps the pain. °· Avoid sexual intercourse, if it causes pain. °· Avoid stressful situations. °· Keep your follow-up appointments and tests, as your caregiver orders. °· If the pain does not go away with medicine or surgery, you may try: °¨ Acupuncture. °¨ Relaxation exercises (yoga, meditation). °¨ Group therapy. °¨ Counseling. °SEEK MEDICAL CARE IF:  °· You notice certain foods cause stomach pain. °· Your home care treatment is not helping your pain. °· You need stronger pain medicine. °· You want your IUD removed. °· You feel faint or   lightheaded. °· You develop nausea and vomiting. °· You develop a rash. °· You are having side effects or an allergy to your medicine. °SEEK IMMEDIATE MEDICAL CARE IF:  °· Your  pain does not go away or gets worse. °· You have a fever. °· Your pain is felt only in portions of the abdomen. The right side could possibly be appendicitis. The left lower portion of the abdomen could be colitis or diverticulitis. °· You are passing blood in your stools (bright red or black tarry stools, with or without vomiting). °· You have blood in your urine. °· You develop chills, with or without a fever. °· You pass out. °MAKE SURE YOU:  °· Understand these instructions. °· Will watch your condition. °· Will get help right away if you are not doing well or get worse. °Document Released: 12/31/2006 Document Revised: 07/20/2013 Document Reviewed: 01/20/2009 °ExitCare® Patient Information ©2015 [employer]. This information is not intended to replace advice given to you by your health care provider. Make sure you discuss any questions you have with your health care provider. ° °

## 2013-11-05 NOTE — MAU Note (Signed)
Pt reports spotting for 3 days and lower abdominal pains. Pt took some tylenol yesterday, but it did not relieve the pain.

## 2013-11-05 NOTE — MAU Provider Note (Signed)
History     CSN: [id number]  Arrival date and time: 11/05/13 1718   None     Chief Complaint  Patient presents with  . Abdominal Pain   HPI This is a 43 y.o. female who is one month s/p D&C and ablation who presents with c/o lower abdominal pain for 3 days. Has not called Dr [name] about this. Had some spotting last week. None now. Hx remarkable for breast cancer.  RN Note: Had a procedure last month. (was told should not have a period for at least 5 yrs) Pain in lower abd past 3 days. Started spotting right before pain started, no longer bleeding. Did not call Md.       OB History   Grav Para Term Preterm Abortions TAB SAB Ect Mult Living   1 1 1  0 0 0 0 0 0 1      Past Medical History  Diagnosis Date  . Hypertension   . Iron deficiency   . Bronchiectasis   . Urinary tract infection   . Fibroid   . Chlamydia   . Anemia   . 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 Radiation 03/07/13-04/01/13    Right breast  50 Gy    Past Surgical History  Procedure Laterality Date  . Cesarean section  01/05/2009  . Biopsy breast Left 12/05/12    fibroadenoma  . Breast surgery    . Breast lumpectomy      2014  . Dilitation & currettage/hystroscopy with hydrothermal ablation N/A 08/12/2013    Procedure: DILATATION & CURETTAGE/HYSTEROSCOPY WITH HYDROTHERMAL ABLATION;  Surgeon: Frederico Hamman, MD;  Location: [facility];  Service: Gynecology;  Laterality: N/A;    Family History  Problem Relation Age of Onset  . Anesthesia problems Neg Hx   . Hypertension Mother   . Diabetes Mother   . Breast cancer Paternal Grandmother     dx in her 58s  . Prostate cancer Paternal Grandfather   . Prostate cancer Father 25  . Prostate cancer Paternal Uncle   . Breast cancer Other     paternal grandmother's sister    History  Substance Use Topics  . Smoking status: Never Smoker   . Smokeless tobacco: Never Used  . Alcohol Use: Yes     Comment: occa    Allergies:  Allergies   Allergen Reactions  . Shrimp [Shellfish Allergy] Anaphylaxis  . Shellfish-Derived Products Swelling    Prescriptions prior to admission  Medication Sig Dispense Refill  . albuterol (PROVENTIL HFA;VENTOLIN HFA) 108 (90 BASE) MCG/ACT inhaler Inhale 1-2 puffs into the lungs every 6 (six) hours as needed for wheezing or shortness of breath (rescue).      . ferrous sulfate 325 (65 FE) MG tablet Take 325 mg by mouth daily with breakfast.      . Multiple Vitamins-Minerals (MULTIVITAMIN PO) Take 1 tablet by mouth daily.      . Olmesartan-Amlodipine-HCTZ (TRIBENZOR) 20-5-12.5 MG TABS Take 1 tablet by mouth every morning.  30 tablet  1  . SPECIALTY VITAMINS PRODUCTS PO Take 1 capsule by mouth daily. One-A-Day      . tamoxifen (NOLVADEX) 10 MG tablet Take 10 mg by mouth daily.        Review of Systems  Constitutional: Negative for fever, chills and malaise/fatigue.  Gastrointestinal: Positive for abdominal pain and constipation. Negative for nausea, vomiting and diarrhea.  Genitourinary: Negative for dysuria.   Physical Exam   Blood pressure 172/106, pulse 106, temperature 99.3  F (37.4 C), temperature source Oral, resp. rate 18, height 5' 0.5" (1.537 m), weight 97.523 kg (215 lb).  Physical Exam  Constitutional: She is oriented to person, place, and time. She appears well-developed and well-nourished.  HENT:  Head: Normocephalic.  Cardiovascular: Normal rate.   Respiratory: Effort normal.  GI: Soft.  Genitourinary: Uterus normal. Vaginal discharge (thin white, no erethema, no lesions) found.  Musculoskeletal: Normal range of motion.  Neurological: She is alert and oriented to person, place, and time.  Skin: Skin is warm and dry.  Psychiatric: She has a normal mood and affect.    MAU Course  Procedures  MDM Results for orders placed during the hospital encounter of 11/05/13 (from the past 72 hour(s))  URINALYSIS, ROUTINE W REFLEX MICROSCOPIC     Status: None   Collection Time     11/05/13  5:55 PM      Result Value Ref Range   Color, Urine YELLOW  YELLOW   APPearance CLEAR  CLEAR   Specific Gravity, Urine 1.020  1.005 - 1.030   pH 6.0  5.0 - 8.0   Glucose, UA NEGATIVE  NEGATIVE mg/dL   Hgb urine dipstick NEGATIVE  NEGATIVE   Bilirubin Urine NEGATIVE  NEGATIVE   Ketones, ur NEGATIVE  NEGATIVE mg/dL   Protein, ur NEGATIVE  NEGATIVE mg/dL   Urobilinogen, UA 0.2  0.0 - 1.0 mg/dL   Nitrite NEGATIVE  NEGATIVE   Leukocytes, UA NEGATIVE  NEGATIVE   Comment: MICROSCOPIC NOT DONE ON URINES WITH NEGATIVE PROTEIN, BLOOD, LEUKOCYTES, NITRITE, OR GLUCOSE <1000 mg/dL.  POCT PREGNANCY, URINE     Status: None   Collection Time    11/05/13  6:44 PM      Result Value Ref Range   Preg Test, Ur NEGATIVE  NEGATIVE   Comment:            THE SENSITIVITY OF THIS     METHODOLOGY IS >24 mIU/mL  CBC WITH DIFFERENTIAL     Status: Abnormal   Collection Time    11/05/13  9:25 PM      Result Value Ref Range   WBC 5.3  4.0 - 10.5 K/uL   RBC 4.78  3.87 - 5.11 MIL/uL   Hemoglobin 11.4 (*) 12.0 - 15.0 g/dL   HCT 35.0 (*) 36.0 - 46.0 %   MCV 73.2 (*) 78.0 - 100.0 fL   MCH 23.8 (*) 26.0 - 34.0 pg   MCHC 32.6  30.0 - 36.0 g/dL   RDW 17.4 (*) 11.5 - 15.5 %   Platelets 189  150 - 400 K/uL   Neutrophils Relative % 71  43 - 77 %   Neutro Abs 3.8  1.7 - 7.7 K/uL   Lymphocytes Relative 20  12 - 46 %   Lymphs Abs 1.1  0.7 - 4.0 K/uL   Monocytes Relative 8  3 - 12 %   Monocytes Absolute 0.4  0.1 - 1.0 K/uL   Eosinophils Relative 1  0 - 5 %   Eosinophils Absolute 0.1  0.0 - 0.7 K/uL   Basophils Relative 0  0 - 1 %   Basophils Absolute 0.0  0.0 - 0.1 K/uL  WET PREP, GENITAL     Status: Abnormal   Collection Time    11/05/13  9:37 PM      Result Value Ref Range   Yeast Wet Prep HPF POC NONE SEEN  NONE SEEN   Trich, Wet Prep NONE SEEN  NONE SEEN   Clue  Cells Wet Prep HPF POC NONE SEEN  NONE SEEN   WBC, Wet Prep HPF POC FEW (*) NONE SEEN   Comment: FEW BACTERIA SEEN    Went to [place] and  took all of her things Told tech she was not coming back after [place]  >> then she did come back to room   US Pelvis Complete  11/05/2013   CLINICAL DATA:  Pelvic pain and bleeding. History of ablation 3 months prior. History of breast cancer using tamoxifen. Rule out ovarian cyst.  EXAM: TRANSABDOMINAL AND TRANSVAGINAL ULTRASOUND OF PELVIS  TECHNIQUE: Both transabdominal and transvaginal ultrasound examinations of the pelvis were performed. Transabdominal technique was performed for global imaging of the pelvis including uterus, ovaries, adnexal regions, and pelvic cul-de-sac. It was necessary to proceed with endovaginal exam following the transabdominal exam to visualize the ovaries.  COMPARISON:  01/31/2013    FINDINGS: Uterus  Measurements: 11 x 6 x 8 cm. There are numerous heterogeneous myometrial masses consistent with fibroids. The 3 largest and measured are located in the the fundus (4.4 cm in maximal diameter), posterior uterine body (4 cm diameter), and left lower uterine segment (3.8 cm in maximal diameter). None appear submucosal.  Endometrium  Indistinct, with measurement not possible. This is often the case after endometrial ablation.  Right ovary  Measurements: 3.5 x 2.6 x 2.1 cm. Normal appearance/no adnexal mass.  Left ovary  Measurements: 3.9 x 2.3 x 3.5 cm. Normal appearance/no adnexal mass.  Other findings  No free fluid.    IMPRESSION: 1. Normal ovaries. 2. Multiple uterine fibroids, up to 4 cm diameter. 3. Endometrium not visible/measurable after endometrial ablation.     Electronically Signed   By: Jorje Guild M.D.   On: 11/05/2013 22:22     Reviewed normal findings, except for fibroids.  Pt very upset, states Dr [name] told her he took all of the fibroids out. "I'm gonna have it out with him!"  Assessment and Plan  A:  Pelvic pain x 3 days       S/P ablation and D&C       Uterine fibroids       No evidence for appendicitis or UTI  P:  Discussed findings       Discharge home       Ibuprofen PRN       Call Dr [name] in AM to see him next week   [facility] 11/05/2013, 8:56 PM

## 2013-11-05 NOTE — MAU Note (Addendum)
Had a procedure last month. (was told should not have a period for at least 5 yrs) Pain in lower abd past 3 days.  Started spotting right before pain started, no longer bleeding. Did not call Md.

## 2013-11-06 LAB — GC/CHLAMYDIA PROBE AMP
CT Probe RNA: NEGATIVE
GC PROBE AMP APTIMA: NEGATIVE

## 2013-12-03 ENCOUNTER — Ambulatory Visit
Admission: RE | Admit: 2013-12-03 | Discharge: 2013-12-03 | Disposition: A | Discharge status: Home / Self Care | Payer: BC Managed Care – PPO | Source: Ambulatory Visit | Attending: Radiation Oncology | Admitting: Radiation Oncology

## 2013-12-03 VITALS — BP 168/97 | HR 95 | Temp 98.1°F | Wt 214.9 lb

## 2013-12-03 DIAGNOSIS — C50411 Malignant neoplasm of upper-outer quadrant of right female breast: Secondary | ICD-10-CM

## 2013-12-03 NOTE — Progress Notes (Signed)
Patient for routine follow up completion of radiation to right breast.Taking tamoxifen with minimal side effects.Breast still hyperpigmented.told to continue application of lotion with vitamin e.vaginal bleeding minimal post ablation.right breast mammogram done May 2015 at [facility].Due for bilateral mammogram.

## 2013-12-03 NOTE — Progress Notes (Signed)
   Department of Radiation Oncology  Phone:  [phone] Fax:        [phone]   Name: Sheryl Cox MRN: [id number]  DOB: [birth year]  Date: 12/03/2013  Follow Up Visit Note  Diagnosis: T1aN0 Right breast cancer  Summary and Interval since last radiation: 50 Gy to the right breast completed 05/02/13  Interval History: Sheryl Cox presents today for routine followup.  She is doing well. She had a negative mammogram in May at [facility].  She sees her surgeon and has her bilateral mammograms scheduled there in November. She is tolerating tamoxifen well. She has an appointment with Dr. Lindi Adie next week. Her job is going well. Her daughter is with her.No further uterine bleeding.   Allergies:  Allergies  Allergen Reactions  . Shrimp [Shellfish Allergy] Anaphylaxis  . Shellfish-Derived Products Swelling    Medications:  Current Outpatient Prescriptions  Medication Sig Dispense Refill  . albuterol (PROAIR HFA) 108 (90 BASE) MCG/ACT inhaler Inhale into the lungs.      Marland Kitchen ibuprofen (ADVIL,MOTRIN) 600 MG tablet Take 1 tablet (600 mg total) by mouth every 6 (six) hours as needed.  30 tablet  1  . Multiple Vitamins-Minerals (MULTIVITAMIN PO) Take 1 tablet by mouth daily.      . Olmesartan-Amlodipine-HCTZ (TRIBENZOR) 20-5-12.5 MG TABS Take 1 tablet by mouth every morning.  30 tablet  1  . SPECIALTY VITAMINS PRODUCTS PO Take 1 capsule by mouth daily. One-A-Day      . tamoxifen (NOLVADEX) 10 MG tablet Take 10 mg by mouth daily.       No current facility-administered medications for this encounter.    Physical Exam:  Filed Vitals:   12/03/13 1402  BP: 168/97  Pulse: 95  Temp: 98.1 F (36.7 C)  Weight: 214 lb 14.4 oz (97.478 kg)  Hyperpigmentation of the right breast. Ropey scar tissue along the inframammary scar on the right, horizontally. No palpable abnormalities on the left.   IMPRESSION: Sheryl Cox is a 43 y.o. female s/p breast conservation currently NED.  PLAN:   1)  Discussed need for yearly mammograms. 2) Continue tamoxifen 3) follow up here prn. Has appt with med onc and surgery.    [name], MD

## 2013-12-03 NOTE — Addendum Note (Signed)
Encounter addended by: Arlyss Repress, RN on: 12/03/2013  2:37 PM<BR>     Documentation filed: Notes Section

## 2013-12-10 ENCOUNTER — Ambulatory Visit: Payer: Self-pay | Admitting: Hematology and Oncology

## 2013-12-18 ENCOUNTER — Other Ambulatory Visit: Payer: Self-pay | Admitting: *Deleted

## 2013-12-18 ENCOUNTER — Other Ambulatory Visit: Payer: Self-pay

## 2013-12-18 NOTE — Telephone Encounter (Signed)
Spoke with pharmacist at [facility] - refill requested by pt.  Authorized 30 day supply, no refills.  Pharmacist will notate on scrip that patient needs office visit.  Let pt know refill sent and she needed to be seen for follow up before another could be done, as she has missed last four appointment, not seen in a year.  Appt made for Mon 10/5 at 330.  Pt voiced understanding.  POF sent.

## 2013-12-21 ENCOUNTER — Ambulatory Visit (HOSPITAL_BASED_OUTPATIENT_CLINIC_OR_DEPARTMENT_OTHER): Payer: BC Managed Care – PPO | Admitting: Hematology and Oncology

## 2013-12-21 ENCOUNTER — Other Ambulatory Visit: Payer: BC Managed Care – PPO

## 2013-12-21 VITALS — BP 172/107 | HR 101 | Temp 98.8°F | Resp 18 | Ht 60.5 in | Wt 213.5 lb

## 2013-12-21 DIAGNOSIS — D0591 Unspecified type of carcinoma in situ of right breast: Secondary | ICD-10-CM

## 2013-12-21 DIAGNOSIS — C50411 Malignant neoplasm of upper-outer quadrant of right female breast: Secondary | ICD-10-CM

## 2013-12-21 DIAGNOSIS — Z170 Estrogen receptor positive status [ER+]: Secondary | ICD-10-CM

## 2013-12-21 MED ORDER — TAMOXIFEN CITRATE 20 MG PO TABS: 20.0000 mg | ORAL_TABLET | Freq: Every day | ORAL | Status: DC

## 2013-12-21 NOTE — Progress Notes (Signed)
Patient Care Team: Lucianne Lei, MD as PCP - General (Family Medicine)  DIAGNOSIS: Breast cancer of upper-outer quadrant of right female breast   Primary site: Breast (Right)   Staging method: AJCC 7th Edition   Clinical: Stage 0 (Tis (DCIS), N0, cM0)   Summary: Stage 0 (Tis (DCIS), N0, cM0)   Clinical comments: Staged at breast conference 11/26/12.   SUMMARY OF ONCOLOGIC HISTORY:   Breast cancer of upper-outer quadrant of right female breast   11/06/2012 Mammogram  Numerous calcifications appear both linear in morphology and linear in distribution and extend over a large portion of the breast.  The longest area is 6.0 x 1.2 cm.  A second area is 6.0 x 1.4 cm.  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    - 05/02/2013 Radiation Therapy 50 Gy to the right breast    11/26/2012 Breast MRI upper inner quadrant of the posterior left breast, there is an 8 x 7 x 7 mm circumscribed enhancing mass;     CHIEF COMPLIANT: Right breast DCIS followup  INTERVAL HISTORY: Sheryl Cox is a 43 year old American lady with above-mentioned history of DCIS involving the right breast. She had extensive area of abnormality measuring 46 cm. The second area also abnormal. She underwent two biopsies both of which revealed DCIS. She was recommended to undergo mastectomy but she went to [facility] where she underwent lumpectomy. She went on tamoxifen therapy after finishing this. She has been tolerating it fairly well without any major problems or concerns. It is unclear why she was started on 10 mg dosage. She denies any lumps or nodules in the breast  REVIEW OF SYSTEMS:   Constitutional: Denies fevers, chills or abnormal weight loss Eyes: Denies blurriness of vision Ears, nose, mouth, throat, and face: Denies mucositis or sore  throat Respiratory: Denies cough, dyspnea or wheezes Cardiovascular: Denies palpitation, chest discomfort or lower extremity swelling Gastrointestinal:  Denies nausea, heartburn or change in bowel habits Skin: Denies abnormal skin rashes Lymphatics: Denies new lymphadenopathy or easy bruising Neurological:Denies numbness, tingling or new weaknesses Behavioral/Psych: Mood is stable, no new changes  Breast:  denies any pain or lumps or nodules in either breasts All other systems were reviewed with the patient and are negative.  I have reviewed the past medical history, past surgical history, social history and family history with the patient and they are unchanged from previous note.  ALLERGIES:  is allergic to shrimp and shellfish-derived products.  MEDICATIONS:  Current Outpatient Prescriptions  Medication Sig Dispense Refill  . albuterol (PROAIR HFA) 108 (90 BASE) MCG/ACT inhaler Inhale into the lungs.      Marland Kitchen ibuprofen (ADVIL,MOTRIN) 600 MG tablet Take 1 tablet (600 mg total) by mouth every 6 (six) hours as needed.  30 tablet  1  . Multiple Vitamins-Minerals (MULTIVITAMIN PO) Take 1 tablet by mouth daily.      . Olmesartan-Amlodipine-HCTZ (TRIBENZOR) 20-5-12.5 MG TABS Take 1 tablet by mouth every morning.  30 tablet  1  . SPECIALTY VITAMINS PRODUCTS PO Take 1 capsule by mouth daily. One-A-Day      . tamoxifen (NOLVADEX) 10 MG tablet Take 10 mg by mouth daily.      . tamoxifen (NOLVADEX) 20 MG tablet Take 1 tablet (20 mg total) by mouth daily.  90 tablet  3   No current facility-administered medications  for this visit.    PHYSICAL EXAMINATION: ECOG PERFORMANCE STATUS: 0 - Asymptomatic  Filed Vitals:   12/21/13 1533  BP: 172/107  Pulse: 101  Temp: 98.8 F (37.1 C)  Resp: 18   Filed Weights   12/21/13 1533  Weight: 213 lb 8 oz (96.843 kg)    GENERAL:alert, no distress and comfortable SKIN: skin color, texture, turgor are normal, no rashes or significant lesions EYES:  normal, Conjunctiva are pink and non-injected, sclera clear OROPHARYNX:no exudate, no erythema and lips, buccal mucosa, and tongue normal  NECK: supple, thyroid normal size, non-tender, without nodularity LYMPH:  no palpable lymphadenopathy in the cervical, axillary or inguinal LUNGS: clear to auscultation and percussion with normal breathing effort HEART: regular rate & rhythm and no murmurs and no lower extremity edema ABDOMEN:abdomen soft, non-tender and normal bowel sounds Musculoskeletal:no cyanosis of digits and no clubbing  NEURO: alert & oriented x 3 with fluent speech, no focal motor/sensory deficits  LABORATORY DATA:  I have reviewed the data as listed   Chemistry      Component Value Date/Time   NA 139 08/06/2013 0945   NA 138 11/26/2012 1225   K 4.1 08/06/2013 0945   K 3.6 11/26/2012 1225   CL 102 08/06/2013 0945   CO2 26 08/06/2013 0945   CO2 28 11/26/2012 1225   BUN 14 08/06/2013 0945   BUN 11.5 11/26/2012 1225   CREATININE 0.67 08/06/2013 0945   CREATININE 0.8 11/26/2012 1225      Component Value Date/Time   CALCIUM 8.9 08/06/2013 0945   CALCIUM 9.8 11/26/2012 1225   ALKPHOS 25* 06/05/2013 1158   ALKPHOS 32* 11/26/2012 1225   AST 28 06/05/2013 1158   AST 13 11/26/2012 1225   ALT 11 06/05/2013 1158   ALT 14 11/26/2012 1225   BILITOT 0.4 06/05/2013 1158   BILITOT 0.27 11/26/2012 1225       Lab Results  Component Value Date   WBC 5.3 11/05/2013   HGB 11.4* 11/05/2013   HCT 35.0* 11/05/2013   MCV 73.2* 11/05/2013   PLT 189 11/05/2013   NEUTROABS 3.8 11/05/2013     RADIOGRAPHIC STUDIES: I have personally reviewed the radiology reports and agreed with their findings. No results found.   ASSESSMENT & PLAN:  Breast cancer of upper-outer quadrant of right female breast Right breast DCIS ER/PR positive status post lumpectomy at [facility] currently on tamoxifen 10 mg daily I discussed with her that the standard dose of tamoxifen 20 mg daily and that we should try to  increase the dosage to the standard regimen and see how she tolerates. If she tolerates it well then she can fill the 20 mg daily dosage but I provided her as a paper prescription. She is tolerating the treatment fairly well except for minor hot flashes. She also had a recent D&C done for her uterine bleeding and then she has stopped bleeding.  Surveillance: Patient undergoes mammograms at [facility] and she has plans to do so on followup. She also follows up with her surgeon and with radiation oncologist.  Survivorship: Patient has been exercising at [employer] and is trying to stay active and healthy.   No orders of the defined types were placed in this encounter.   The patient has a good understanding of the overall plan. she agrees with it. She will call with any problems that may develop before her next visit here.  I spent 15 minutes counseling the patient face to face. The total time  spent in the appointment was 20 minutes and more than 50% was on counseling and review of test results    Rulon Eisenmenger, MD 12/21/2013 4:03 PM

## 2013-12-21 NOTE — Assessment & Plan Note (Signed)
Right breast DCIS ER/PR positive status post lumpectomy at [facility] currently on tamoxifen 10 mg daily I discussed with her that the standard dose of tamoxifen 20 mg daily and that we should try to increase the dosage to the standard regimen and see how she tolerates. If she tolerates it well then she can fill the 20 mg daily dosage but I provided her as a paper prescription. She is tolerating the treatment fairly well except for minor hot flashes. She also had a recent D&C done for her uterine bleeding and then she has stopped bleeding.  Surveillance: Patient undergoes mammograms at [facility] and she has plans to do so on followup. She also follows up with her surgeon and with radiation oncologist.  Survivorship: Patient has been exercising at [employer] and is trying to stay active and healthy.

## 2013-12-23 ENCOUNTER — Telehealth: Payer: Self-pay | Admitting: Hematology and Oncology

## 2013-12-23 NOTE — Telephone Encounter (Signed)
, °

## 2014-01-18 ENCOUNTER — Encounter (HOSPITAL_COMMUNITY): Payer: Self-pay | Admitting: *Deleted

## 2014-01-24 ENCOUNTER — Other Ambulatory Visit: Payer: Self-pay | Admitting: Oncology

## 2014-01-24 DIAGNOSIS — C50411 Malignant neoplasm of upper-outer quadrant of right female breast: Secondary | ICD-10-CM

## 2014-02-15 ENCOUNTER — Encounter (HOSPITAL_COMMUNITY): Payer: Self-pay

## 2014-02-15 ENCOUNTER — Emergency Department (HOSPITAL_COMMUNITY)
Admission: EM | Admit: 2014-02-15 | Discharge: 2014-02-15 | Disposition: A | Discharge status: Home / Self Care | Payer: BC Managed Care – PPO | Source: Home / Self Care | Attending: Emergency Medicine | Admitting: Emergency Medicine

## 2014-02-15 DIAGNOSIS — J00 Acute nasopharyngitis [common cold]: Secondary | ICD-10-CM

## 2014-02-15 LAB — POCT RAPID STREP A: Streptococcus, Group A Screen (Direct): NEGATIVE

## 2014-02-15 NOTE — ED Notes (Signed)
C/o sore throat and swelling in throat since Saturday. C/o lower abdominal pain and pain in her tailbone area this AM , much like what she has when she is constipated

## 2014-02-15 NOTE — Discharge Instructions (Signed)
Rapid strep negative. Symptomatic care at home Expect improvement over next few days  Pharyngitis Pharyngitis is redness, pain, and swelling (inflammation) of your pharynx.  CAUSES  Pharyngitis is usually caused by infection. Most of the time, these infections are from viruses (viral) and are part of a cold. However, sometimes pharyngitis is caused by bacteria (bacterial). Pharyngitis can also be caused by allergies. Viral pharyngitis may be spread from person to person by coughing, sneezing, and personal items or utensils (cups, forks, spoons, toothbrushes). Bacterial pharyngitis may be spread from person to person by more intimate contact, such as kissing.  SIGNS AND SYMPTOMS  Symptoms of pharyngitis include:   Sore throat.   Tiredness (fatigue).   Low-grade fever.   Headache.  Joint pain and muscle aches.  Skin rashes.  Swollen lymph nodes.  Plaque-like film on throat or tonsils (often seen with bacterial pharyngitis). DIAGNOSIS  Your health care provider will ask you questions about your illness and your symptoms. Your medical history, along with a physical exam, is often all that is needed to diagnose pharyngitis. Sometimes, a rapid strep test is done. Other lab tests may also be done, depending on the suspected cause.  TREATMENT  Viral pharyngitis will usually get better in 3-4 days without the use of medicine. Bacterial pharyngitis is treated with medicines that kill germs (antibiotics).  HOME CARE INSTRUCTIONS   Drink enough water and fluids to keep your urine clear or pale yellow.   Only take over-the-counter or prescription medicines as directed by your health care provider:   If you are prescribed antibiotics, make sure you finish them even if you start to feel better.   Do not take aspirin.   Get lots of rest.   Gargle with 8 oz of salt water ( tsp of salt per 1 qt of water) as often as every 1-2 hours to soothe your throat.   Throat lozenges (if you  are not at risk for choking) or sprays may be used to soothe your throat. SEEK MEDICAL CARE IF:   You have large, tender lumps in your neck.  You have a rash.  You cough up green, yellow-brown, or bloody spit. SEEK IMMEDIATE MEDICAL CARE IF:   Your neck becomes stiff.  You drool or are unable to swallow liquids.  You vomit or are unable to keep medicines or liquids down.  You have severe pain that does not go away with the use of recommended medicines.  You have trouble breathing (not caused by a stuffy nose). MAKE SURE YOU:   Understand these instructions.  Will watch your condition.  Will get help right away if you are not doing well or get worse. Document Released: 03/05/2005 Document Revised: 12/24/2012 Document Reviewed: 11/10/2012 [facility] Patient Information 2015 [place], [place]. This information is not intended to replace advice given to you by your health care provider. Make sure you discuss any questions you have with your health care provider.  Upper Respiratory Infection, Adult An upper respiratory infection (URI) is also sometimes known as the common cold. The upper respiratory tract includes the nose, sinuses, throat, trachea, and bronchi. Bronchi are the airways leading to the lungs. Most people improve within 1 week, but symptoms can last up to 2 weeks. A residual cough may last even longer.  CAUSES Many different viruses can infect the tissues lining the upper respiratory tract. The tissues become irritated and inflamed and often become very moist. Mucus production is also common. A cold is contagious. You can easily  spread the virus to others by oral contact. This includes kissing, sharing a glass, coughing, or sneezing. Touching your mouth or nose and then touching a surface, which is then touched by another person, can also spread the virus. SYMPTOMS  Symptoms typically develop 1 to 3 days after you come in contact with a cold virus. Symptoms vary from person to  person. They may include:  Runny nose.  Sneezing.  Nasal congestion.  Sinus irritation.  Sore throat.  Loss of voice (laryngitis).  Cough.  Fatigue.  Muscle aches.  Loss of appetite.  Headache.  Low-grade fever. DIAGNOSIS  You might diagnose your own cold based on familiar symptoms, since most people get a cold 2 to 3 times a year. Your caregiver can confirm this based on your exam. Most importantly, your caregiver can check that your symptoms are not due to another disease such as strep throat, sinusitis, pneumonia, asthma, or epiglottitis. Blood tests, throat tests, and X-rays are not necessary to diagnose a common cold, but they may sometimes be helpful in excluding other more serious diseases. Your caregiver will decide if any further tests are required. RISKS AND COMPLICATIONS  You may be at risk for a more severe case of the common cold if you smoke cigarettes, have chronic heart disease (such as heart failure) or lung disease (such as asthma), or if you have a weakened immune system. The very young and very old are also at risk for more serious infections. Bacterial sinusitis, middle ear infections, and bacterial pneumonia can complicate the common cold. The common cold can worsen asthma and chronic obstructive pulmonary disease (COPD). Sometimes, these complications can require emergency medical care and may be life-threatening. PREVENTION  The best way to protect against getting a cold is to practice good hygiene. Avoid oral or hand contact with people with cold symptoms. Wash your hands often if contact occurs. There is no clear evidence that vitamin C, vitamin E, echinacea, or exercise reduces the chance of developing a cold. However, it is always recommended to get plenty of rest and practice good nutrition. TREATMENT  Treatment is directed at relieving symptoms. There is no cure. Antibiotics are not effective, because the infection is caused by a virus, not by bacteria.  Treatment may include:  Increased fluid intake. Sports drinks offer valuable electrolytes, sugars, and fluids.  Breathing heated mist or steam (vaporizer or shower).  Eating chicken soup or other clear broths, and maintaining good nutrition.  Getting plenty of rest.  Using gargles or lozenges for comfort.  Controlling fevers with ibuprofen or acetaminophen as directed by your caregiver.  Increasing usage of your inhaler if you have asthma. Zinc gel and zinc lozenges, taken in the first 24 hours of the common cold, can shorten the duration and lessen the severity of symptoms. Pain medicines may help with fever, muscle aches, and throat pain. A variety of non-prescription medicines are available to treat congestion and runny nose. Your caregiver can make recommendations and may suggest nasal or lung inhalers for other symptoms.  HOME CARE INSTRUCTIONS   Only take over-the-counter or prescription medicines for pain, discomfort, or fever as directed by your caregiver.  Use a warm mist humidifier or inhale steam from a shower to increase air moisture. This may keep secretions moist and make it easier to breathe.  Drink enough water and fluids to keep your urine clear or pale yellow.  Rest as needed.  Return to work when your temperature has returned to normal or as your  caregiver advises. You may need to stay home longer to avoid infecting others. You can also use a face mask and careful hand washing to prevent spread of the virus. SEEK MEDICAL CARE IF:   After the first few days, you feel you are getting worse rather than better.  You need your caregiver's advice about medicines to control symptoms.  You develop chills, worsening shortness of breath, or brown or red sputum. These may be signs of pneumonia.  You develop yellow or brown nasal discharge or pain in the face, especially when you bend forward. These may be signs of sinusitis.  You develop a fever, swollen neck glands, pain  with swallowing, or white areas in the back of your throat. These may be signs of strep throat. SEEK IMMEDIATE MEDICAL CARE IF:   You have a fever.  You develop severe or persistent headache, ear pain, sinus pain, or chest pain.  You develop wheezing, a prolonged cough, cough up blood, or have a change in your usual mucus (if you have chronic lung disease).  You develop sore muscles or a stiff neck. Document Released: 08/29/2000 Document Revised: 05/28/2011 Document Reviewed: 06/10/2013 [facility] Patient Information 2015 [place], [place]. This information is not intended to replace advice given to you by your health care provider. Make sure you discuss any questions you have with your health care provider.

## 2014-02-15 NOTE — ED Provider Notes (Signed)
CSN: [id number]     Arrival date & time 02/15/14  0830 History   First MD Initiated Contact with Patient 02/15/14 [phone]     Chief Complaint  Patient presents with  . Sore Throat   (Consider location/radiation/quality/duration/timing/severity/associated sxs/prior Treatment) Patient is a 43 y.o. female presenting with URI. The history is provided by the patient.  URI Presenting symptoms: congestion, cough, rhinorrhea and sore throat   Severity:  Mild Onset quality:  Gradual Duration:  2 days Timing:  Constant Progression:  Unchanged Chronicity:  New   Past Medical History  Diagnosis Date  . Hypertension   . Iron deficiency   . Bronchiectasis   . Urinary tract infection   . Fibroid   . Chlamydia   . Anemia   . 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 Radiation 03/07/13-04/01/13    Right breast  50 Gy   Past Surgical History  Procedure Laterality Date  . Cesarean section  01/05/2009  . Biopsy breast Left 12/05/12    fibroadenoma  . Breast surgery    . Breast lumpectomy      2014  . Dilitation & currettage/hystroscopy with hydrothermal ablation N/A 08/12/2013    Procedure: DILATATION & CURETTAGE/HYSTEROSCOPY WITH HYDROTHERMAL ABLATION;  Surgeon: Frederico Hamman, MD;  Location: [facility];  Service: Gynecology;  Laterality: N/A;   Family History  Problem Relation Age of Onset  . Anesthesia problems Neg Hx   . Hypertension Mother   . Diabetes Mother   . Breast cancer Paternal Grandmother     dx in her 58s  . Prostate cancer Paternal Grandfather   . Prostate cancer Father 60  . Prostate cancer Paternal Uncle   . Breast cancer Other     paternal grandmother's sister   History  Substance Use Topics  . Smoking status: Never Smoker   . Smokeless tobacco: Never Used  . Alcohol Use: Yes     Comment: occa   OB History    Gravida Para Term Preterm AB TAB SAB Ectopic Multiple Living   1 1 1  0 0 0 0 0 0 1     Review of Systems  HENT: Positive for congestion, rhinorrhea  and sore throat.   Respiratory: Positive for cough.   All other systems reviewed and are negative.   Allergies  Shrimp and Shellfish-derived products  Home Medications   Prior to Admission medications   Medication Sig Start Date End Date Taking? Authorizing Provider  albuterol (PROAIR HFA) 108 (90 BASE) MCG/ACT inhaler Inhale into the lungs.    Historical Provider, MD  ibuprofen (ADVIL,MOTRIN) 600 MG tablet Take 1 tablet (600 mg total) by mouth every 6 (six) hours as needed. 11/05/13   Seabron Spates, CNM  Multiple Vitamins-Minerals (MULTIVITAMIN PO) Take 1 tablet by mouth daily.    Historical Provider, MD  Olmesartan-Amlodipine-HCTZ (TRIBENZOR) 20-5-12.5 MG TABS Take 1 tablet by mouth every morning. 07/02/12   Nat Christen, MD  SPECIALTY VITAMINS PRODUCTS PO Take 1 capsule by mouth daily. One-A-Day    Historical Provider, MD  tamoxifen (NOLVADEX) 20 MG tablet TAKE 1 TABLET (20 MG TOTAL) BY MOUTH DAILY. 01/25/14   Rulon Eisenmenger, MD   BP 167/108 mmHg  Pulse 88  Temp(Src) 97.7 F (36.5 C) (Oral)  Resp 16  SpO2 97% Physical Exam  Constitutional: She is oriented to person, place, and time. She appears well-developed and well-nourished. No distress.  HENT:  Head: Normocephalic and atraumatic.  Right Ear: Hearing, tympanic membrane, external ear  and ear canal normal.  Left Ear: Hearing, tympanic membrane, external ear and ear canal normal.  Nose: Nose normal.  Mouth/Throat: Uvula is midline, oropharynx is clear and moist and mucous membranes are normal.  Eyes: Conjunctivae are normal. No scleral icterus.  Neck: Normal range of motion. Neck supple.  Cardiovascular: Normal rate, regular rhythm and normal heart sounds.   Pulmonary/Chest: Effort normal and breath sounds normal.  Musculoskeletal: Normal range of motion.  Lymphadenopathy:    She has no cervical adenopathy.  Neurological: She is alert and oriented to person, place, and time.  Skin: Skin is warm and dry. No rash noted. No  erythema.  Psychiatric: She has a normal mood and affect. Her behavior is normal.  Nursing note and vitals reviewed.   ED Course  Procedures (including critical care time) Labs Review Labs Reviewed  POCT RAPID STREP A ([facility] CARE ONLY)    Imaging Review No results found.   MDM   1. Common cold    Rapid strep negative. Symptomatic care at home Expect improvement over next few days    Lutricia Feil, [place] 02/15/14 [phone]

## 2014-02-16 LAB — CULTURE, GROUP A STREP

## 2014-02-17 ENCOUNTER — Telehealth (HOSPITAL_COMMUNITY): Payer: Self-pay | Admitting: Emergency Medicine

## 2014-02-17 ENCOUNTER — Telehealth (HOSPITAL_COMMUNITY): Payer: Self-pay | Admitting: *Deleted

## 2014-02-17 MED ORDER — AMOXICILLIN 500 MG PO CAPS: 500.0000 mg | ORAL_CAPSULE | Freq: Three times a day (TID) | ORAL | Status: DC

## 2014-02-17 NOTE — ED Notes (Signed)
Her throat culture came back positive for group A strep. She was not treated with antibiotics. She will need treatment with amoxicillin 500 mg, #30, one 3 times a day for 10 days. This will be sent to her pharmacy, the [facility] on [address]. We will need to call the patient and inform her of this result and need for treatment.  Harden Mo, MD 02/17/14 [phone]

## 2014-02-17 NOTE — ED Notes (Signed)
Throat culture: Group A Strep (S.Pyogenes).  Dr. Jake Michaelis e-prescribed Amoxicillin to pt.'s pharmacy.  I called pt. Pt. verified x 2 and given result. Pt. told she needs Amoxicillin and where to pick up her Rx. Pt. instructed if anyone she exposed gets the same symptoms,should get checked for strep as well.  If not better after the medication to get rechecked. Pt. voiced understanding. Sheryl Cox 02/17/2014

## 2014-04-27 ENCOUNTER — Telehealth: Payer: Self-pay

## 2014-04-27 NOTE — Telephone Encounter (Signed)
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

## 2014-06-24 ENCOUNTER — Ambulatory Visit: Payer: Self-pay | Admitting: Hematology and Oncology

## 2014-08-19 ENCOUNTER — Telehealth: Payer: Self-pay | Admitting: Hematology and Oncology

## 2014-08-19 NOTE — Telephone Encounter (Signed)
pt came into r/s missed apptwith Gudena-gave pt copy of sch

## 2014-09-03 ENCOUNTER — Emergency Department (HOSPITAL_COMMUNITY): Payer: BC Managed Care – PPO

## 2014-09-03 ENCOUNTER — Encounter (HOSPITAL_COMMUNITY): Payer: Self-pay | Admitting: General Practice

## 2014-09-03 ENCOUNTER — Emergency Department (HOSPITAL_COMMUNITY)
Admission: EM | Admit: 2014-09-03 | Discharge: 2014-09-03 | Disposition: A | Discharge status: Home / Self Care | Payer: BC Managed Care – PPO | Attending: Emergency Medicine | Admitting: Emergency Medicine

## 2014-09-03 DIAGNOSIS — J45909 Unspecified asthma, uncomplicated: Secondary | ICD-10-CM | POA: Diagnosis not present

## 2014-09-03 DIAGNOSIS — Z87440 Personal history of urinary (tract) infections: Secondary | ICD-10-CM | POA: Diagnosis not present

## 2014-09-03 DIAGNOSIS — S62502A Fracture of unspecified phalanx of left thumb, initial encounter for closed fracture: Secondary | ICD-10-CM

## 2014-09-03 DIAGNOSIS — D649 Anemia, unspecified: Secondary | ICD-10-CM | POA: Insufficient documentation

## 2014-09-03 DIAGNOSIS — Y998 Other external cause status: Secondary | ICD-10-CM | POA: Diagnosis not present

## 2014-09-03 DIAGNOSIS — Z86018 Personal history of other benign neoplasm: Secondary | ICD-10-CM | POA: Insufficient documentation

## 2014-09-03 DIAGNOSIS — I10 Essential (primary) hypertension: Secondary | ICD-10-CM | POA: Insufficient documentation

## 2014-09-03 DIAGNOSIS — Z853 Personal history of malignant neoplasm of breast: Secondary | ICD-10-CM | POA: Insufficient documentation

## 2014-09-03 DIAGNOSIS — Z8619 Personal history of other infectious and parasitic diseases: Secondary | ICD-10-CM | POA: Diagnosis not present

## 2014-09-03 DIAGNOSIS — S62522A Displaced fracture of distal phalanx of left thumb, initial encounter for closed fracture: Secondary | ICD-10-CM | POA: Insufficient documentation

## 2014-09-03 DIAGNOSIS — S301XXA Contusion of abdominal wall, initial encounter: Secondary | ICD-10-CM | POA: Insufficient documentation

## 2014-09-03 DIAGNOSIS — S6992XA Unspecified injury of left wrist, hand and finger(s), initial encounter: Secondary | ICD-10-CM | POA: Diagnosis present

## 2014-09-03 DIAGNOSIS — Z3202 Encounter for pregnancy test, result negative: Secondary | ICD-10-CM | POA: Insufficient documentation

## 2014-09-03 DIAGNOSIS — Z79899 Other long term (current) drug therapy: Secondary | ICD-10-CM | POA: Diagnosis not present

## 2014-09-03 DIAGNOSIS — Y9389 Activity, other specified: Secondary | ICD-10-CM | POA: Insufficient documentation

## 2014-09-03 DIAGNOSIS — Z7951 Long term (current) use of inhaled steroids: Secondary | ICD-10-CM | POA: Diagnosis not present

## 2014-09-03 DIAGNOSIS — Y92410 Unspecified street and highway as the place of occurrence of the external cause: Secondary | ICD-10-CM | POA: Diagnosis not present

## 2014-09-03 HISTORY — DX: Unspecified asthma, uncomplicated: J45.909

## 2014-09-03 LAB — I-STAT CHEM 8, ED
BUN: 16 mg/dL (ref 6–20)
Calcium, Ion: 1.11 mmol/L — ABNORMAL LOW (ref 1.12–1.23)
Chloride: 106 mmol/L (ref 101–111)
Creatinine, Ser: 0.9 mg/dL (ref 0.44–1.00)
Glucose, Bld: 95 mg/dL (ref 65–99)
HCT: 36 % (ref 36.0–46.0)
Hemoglobin: 12.2 g/dL (ref 12.0–15.0)
Potassium: 3.5 mmol/L (ref 3.5–5.1)
Sodium: 141 mmol/L (ref 135–145)
TCO2: 24 mmol/L (ref 0–100)

## 2014-09-03 LAB — I-STAT BETA HCG BLOOD, ED (MC, WL, AP ONLY): I-stat hCG, quantitative: <5 m[IU]/mL (ref <5)

## 2014-09-03 LAB — POC URINE PREG, ED: Preg Test, Ur: NEGATIVE

## 2014-09-03 MED ORDER — OXYCODONE-ACETAMINOPHEN 5-325 MG PO TABS: 1.0000 | ORAL_TABLET | ORAL | Status: DC | PRN

## 2014-09-03 MED ORDER — HYDROMORPHONE HCL 1 MG/ML IJ SOLN
1.0000 mg | Freq: Once | INTRAMUSCULAR | Status: AC
  Administered 2014-09-03: 1 mg via INTRAVENOUS
  Filled 2014-09-03: qty 1

## 2014-09-03 MED ORDER — OXYCODONE-ACETAMINOPHEN 5-325 MG PO TABS
1.0000 | ORAL_TABLET | Freq: Once | ORAL | Status: AC
  Administered 2014-09-03: 1 via ORAL
  Filled 2014-09-03: qty 1

## 2014-09-03 MED ORDER — IOHEXOL 300 MG/ML  SOLN
100.0000 mL | Freq: Once | INTRAMUSCULAR | Status: AC | PRN
  Administered 2014-09-03: 100 mL via INTRAVENOUS

## 2014-09-03 NOTE — Progress Notes (Signed)
Orthopedic Tech Progress Note Patient Details:  Sheryl Cox 02-01-1971 [id number]  Ortho Devices Type of Ortho Device: Ace wrap, Finger splint, Thumb spica splint Splint Material: Fiberglass Ortho Device/Splint Location: LUE finger splint, RUE thumb spica Ortho Device/Splint Interventions: Ordered, Application   Braulio Bosch 09/03/2014, 8:35 PM

## 2014-09-03 NOTE — Discharge Instructions (Signed)
Blunt Abdominal Trauma A blunt injury to the abdomen can cause pain. The pain is most likely from bruising and stretching of your muscles. This pain is often made worse with movement. Most often these injuries are not serious and get better within 1 week with rest and mild pain medicine. However, internal organs (liver, spleen, kidneys) can be injured with blunt trauma. If you do not get better or if you get worse, further examination may be needed. Continue with your regular daily activities, but avoid any strenuous activities until your pain is improved. If your stomach is upset, stick to a clear liquid diet and slowly advance to solid food.  SEEK IMMEDIATE MEDICAL CARE IF:   You develop increasing pain, nausea, or repeated vomiting.  You develop chest pain or breathing difficulty.  You develop blood in the urine, vomit, or stool.  You develop weakness, fainting, fever, or other serious complaints. Document Released: 04/12/2004 Document Revised: 05/28/2011 Document Reviewed: 07/29/2008 [facility] Patient Information 2015 [place], [place]. This information is not intended to replace advice given to you by your health care provider. Make sure you discuss any questions you have with your health care provider.  Cast or Splint Care Casts and splints support injured limbs and keep bones from moving while they heal. It is important to care for your cast or splint at home.  HOME CARE INSTRUCTIONS  Keep the cast or splint uncovered during the drying period. It can take 24 to 48 hours to dry if it is made of plaster. A fiberglass cast will dry in less than 1 hour.  Do not rest the cast on anything harder than a pillow for the first 24 hours.  Do not put weight on your injured limb or apply pressure to the cast until your health care provider gives you permission.  Keep the cast or splint dry. Wet casts or splints can lose their shape and may not support the limb as well. A wet cast that has lost its  shape can also create harmful pressure on your skin when it dries. Also, wet skin can become infected.  Cover the cast or splint with a plastic bag when bathing or when out in the rain or snow. If the cast is on the trunk of the body, take sponge baths until the cast is removed.  If your cast does become wet, dry it with a towel or a blow dryer on the cool setting only.  Keep your cast or splint clean. Soiled casts may be wiped with a moistened cloth.  Do not place any hard or soft foreign objects under your cast or splint, such as cotton, toilet paper, lotion, or powder.  Do not try to scratch the skin under the cast with any object. The object could get stuck inside the cast. Also, scratching could lead to an infection. If itching is a problem, use a blow dryer on a cool setting to relieve discomfort.  Do not trim or cut your cast or remove padding from inside of it.  Exercise all joints next to the injury that are not immobilized by the cast or splint. For example, if you have a long leg cast, exercise the hip joint and toes. If you have an arm cast or splint, exercise the shoulder, elbow, thumb, and fingers.  Elevate your injured arm or leg on 1 or 2 pillows for the first 1 to 3 days to decrease swelling and pain.It is best if you can comfortably elevate your cast so it  is higher than your heart. SEEK MEDICAL CARE IF:   Your cast or splint cracks.  Your cast or splint is too tight or too loose.  You have unbearable itching inside the cast.  Your cast becomes wet or develops a soft spot or area.  You have a bad smell coming from inside your cast.  You get an object stuck under your cast.  Your skin around the cast becomes red or raw.  You have new pain or worsening pain after the cast has been applied. SEEK IMMEDIATE MEDICAL CARE IF:   You have fluid leaking through the cast.  You are unable to move your fingers or toes.  You have discolored (blue or white), cool, painful,  or very swollen fingers or toes beyond the cast.  You have tingling or numbness around the injured area.  You have severe pain or pressure under the cast.  You have any difficulty with your breathing or have shortness of breath.  You have chest pain. Document Released: 03/02/2000 Document Revised: 12/24/2012 Document Reviewed: 09/11/2012 [facility] Patient Information 2015 [place], [place]. This information is not intended to replace advice given to you by your health care provider. Make sure you discuss any questions you have with your health care provider.  Please follow-up with orthopedics as attached. Please use prescription medication only as directed do not drive drink or operate heavy machinery while taking. Please return immediately to the emergency room if new or worsening signs or symptoms present.

## 2014-09-03 NOTE — ED Notes (Signed)
At time of discharge pt stated PA told her pt would be getting more pain med before dc.
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
## 2014-09-04 ENCOUNTER — Emergency Department (HOSPITAL_COMMUNITY)
Admission: EM | Admit: 2014-09-04 | Discharge: 2014-09-04 | Disposition: A | Discharge status: Home / Self Care | Payer: BC Managed Care – PPO | Attending: Emergency Medicine | Admitting: Emergency Medicine

## 2014-09-04 ENCOUNTER — Encounter (HOSPITAL_COMMUNITY): Payer: Self-pay | Admitting: Emergency Medicine

## 2014-09-04 DIAGNOSIS — Z86018 Personal history of other benign neoplasm: Secondary | ICD-10-CM | POA: Diagnosis not present

## 2014-09-04 DIAGNOSIS — I10 Essential (primary) hypertension: Secondary | ICD-10-CM | POA: Diagnosis not present

## 2014-09-04 DIAGNOSIS — S62252S Displaced fracture of neck of first metacarpal bone, left hand, sequela: Secondary | ICD-10-CM | POA: Diagnosis not present

## 2014-09-04 DIAGNOSIS — J45909 Unspecified asthma, uncomplicated: Secondary | ICD-10-CM | POA: Insufficient documentation

## 2014-09-04 DIAGNOSIS — Z79899 Other long term (current) drug therapy: Secondary | ICD-10-CM | POA: Diagnosis not present

## 2014-09-04 DIAGNOSIS — D649 Anemia, unspecified: Secondary | ICD-10-CM | POA: Diagnosis not present

## 2014-09-04 DIAGNOSIS — Z4689 Encounter for fitting and adjustment of other specified devices: Secondary | ICD-10-CM | POA: Diagnosis present

## 2014-09-04 DIAGNOSIS — S6292XS Unspecified fracture of left wrist and hand, sequela: Secondary | ICD-10-CM

## 2014-09-04 DIAGNOSIS — Z853 Personal history of malignant neoplasm of breast: Secondary | ICD-10-CM | POA: Insufficient documentation

## 2014-09-04 DIAGNOSIS — Z8619 Personal history of other infectious and parasitic diseases: Secondary | ICD-10-CM | POA: Diagnosis not present

## 2014-09-04 DIAGNOSIS — Z87440 Personal history of urinary (tract) infections: Secondary | ICD-10-CM | POA: Diagnosis not present

## 2014-09-04 NOTE — ED Notes (Signed)
Pt. requesting splint at left hand applied yesterday to be check/wrap .
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
## 2014-09-04 NOTE — Discharge Instructions (Signed)
Please follow up with your primary care physician in 1-2 days. If you do not have one please call the [facility] number listed above. Please follow up with the orthopedist as scheduled. Please take your pain medications as prescribed yesterday. Please read all discharge instructions and return precautions.   Thumb Fracture  There are many types of thumb fractures (breaks). There are different ways of treating these fractures, all of which may be correct, varying from case to case. Your caregiver will discuss different ways to treat these fractures with you. TREATMENT   Immobilization. This means the fracture is casted as it is without changing the positions of the fracture (bone pieces) involved. This fracture is casted in a "thumb spica" also called a hitchhiker cast. It is generally left on for 2 to 6 weeks.  Closed reduction. The bones are manipulated back into position without using surgery.  ORIF (open reduction and internal fixation). The fracture site is opened and the bone pieces are fixed into place with some type of hardware such as screws or wires. Your caregiver will discuss the type of fracture you have and the treatment that will be best for that problem. If surgery is the treatment of choice, the following is information for you to know and to let your caregiver know about prior to surgery. LET YOUR CAREGIVERS KNOW ABOUT:  Allergies.  Medications taken including herbs, eye drops, over the counter medications, and creams.  Use of steroids (by mouth or creams).  Previous problems with anesthetics or Novocain.  Family history of anesthetic complications..  Possibility of pregnancy, if this applies.  History of blood clots (thrombophlebitis).  History of bleeding or blood problems.  Previous surgery.  Other health problems. AFTER THE PROCEDURE  After surgery, you will be taken to the recovery area. A nurse will watch and check your progress. Once you  are awake, stable, and taking fluids well, barring other problems you will be allowed to go home. Once home, an ice pack applied to your operative site may help with discomfort and keep the swelling down. Elevate your hand above your heart as much as possible for the first 4-5 days after the injury/surgery. HOME CARE INSTRUCTIONS   Follow your caregiver's instructions as to activities, exercises, physical therapy, and driving a car.  Use thumb and exercise as directed.  Only take over-the-counter or prescription medicines for pain, discomfort, or fever as directed by your caregiver. Do not take aspirin until your caregiver instructs. This can increase bleeding immediately following surgery. SEEK MEDICAL CARE IF:   There is increased bleeding (more than a small spot) from the wound or from beneath your cast or splint.  There is redness, swelling, or increasing pain in the wound or from beneath your cast or splint.  You have pus coming from wound or from beneath your cast or splint.  An unexplained oral temperature above 102 F (38.9 C) develops.  There is a foul smell coming from the wound or dressing or from beneath your cast or splint. SEEK IMMEDIATE MEDICAL CARE IF:   You develop severe pain, decreased sensation such as numbness or tingling.  You develop a rash.  You have difficulty breathing.  Youhave any allergic problems. If you do not have a window in your cast for observing the wound, a discharge or minor bleeding may show up as a stain on the outside of your cast. Report these findings to your caregiver. If you have a removable splint overlying the  surgical dressings it is common to see a small amount of bleeding. Change the dressings as instructed by your caregiver. Document Released: 12/02/2002 Document Revised: 05/28/2011 Document Reviewed: 05/08/2013 [facility] Patient Information 2015 [place], [place]. This information is not intended to replace advice given to you by your  health care provider. Make sure you discuss any questions you have with your health care provider.

## 2014-09-04 NOTE — ED Notes (Signed)
Ortho tech notified about cast repair, ortho to be here in 30 min aprox.

## 2014-09-12 NOTE — Assessment & Plan Note (Signed)
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 She also follows up with her surgeon and with radiation oncologist.  RTC in 6 months

## 2014-09-13 ENCOUNTER — Ambulatory Visit (HOSPITAL_BASED_OUTPATIENT_CLINIC_OR_DEPARTMENT_OTHER): Payer: BC Managed Care – PPO | Admitting: Hematology and Oncology

## 2014-09-13 ENCOUNTER — Encounter: Payer: Self-pay | Admitting: Hematology and Oncology

## 2014-09-13 ENCOUNTER — Telehealth: Payer: Self-pay | Admitting: Hematology and Oncology

## 2014-09-13 VITALS — BP 152/107 | HR 81 | Temp 98.9°F | Resp 18 | Ht 63.0 in | Wt 217.9 lb

## 2014-09-13 DIAGNOSIS — Z170 Estrogen receptor positive status [ER+]: Secondary | ICD-10-CM | POA: Diagnosis not present

## 2014-09-13 DIAGNOSIS — D0511 Intraductal carcinoma in situ of right breast: Secondary | ICD-10-CM | POA: Diagnosis not present

## 2014-09-13 DIAGNOSIS — C50411 Malignant neoplasm of upper-outer quadrant of right female breast: Secondary | ICD-10-CM

## 2014-09-13 NOTE — Telephone Encounter (Signed)
Gave avs & calendar for June 2017 °

## 2014-09-13 NOTE — Addendum Note (Signed)
Addended by: Prentiss Bells on: 09/13/2014 11:55 AM   Modules accepted: Medications

## 2014-09-13 NOTE — Progress Notes (Signed)
Ultrasound and Mammogram dtd Mar 29, 2014 performed at [facility] retrieved by [facility].  Printed and reviewed by Dr. Lindi Adie.  Sent to scan.
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
## 2015-03-21 ENCOUNTER — Other Ambulatory Visit: Payer: Self-pay | Admitting: Hematology and Oncology

## 2015-03-23 ENCOUNTER — Telehealth: Payer: Self-pay

## 2015-03-23 NOTE — Telephone Encounter (Signed)
Chart reviewed.  Pharmacy called - see telephone note dtd 03/23/15

## 2015-03-23 NOTE — Telephone Encounter (Signed)
Refill request rcvd from [facility] for [facility].  Per chart review, pt should have been on tamoxifen daily.  Last fill in Nov 2015 for 6 months.  Per [facility] at [facility] - last fill by pt was Dec 2015 for 90 day supply.  Pt has not received Rx from any other provider through [facility].    Refill sent for 1 yr supply/  LMOVM for pt requesting call back to verify pt is taking tamoxifen as instructed.

## 2015-04-06 ENCOUNTER — Emergency Department (HOSPITAL_COMMUNITY): Payer: BC Managed Care – PPO

## 2015-04-06 ENCOUNTER — Emergency Department (HOSPITAL_COMMUNITY)
Admission: EM | Admit: 2015-04-06 | Discharge: 2015-04-07 | Disposition: A | Discharge status: Home / Self Care | Payer: BC Managed Care – PPO | Attending: Emergency Medicine | Admitting: Emergency Medicine

## 2015-04-06 ENCOUNTER — Encounter (HOSPITAL_COMMUNITY): Payer: Self-pay | Admitting: *Deleted

## 2015-04-06 DIAGNOSIS — Z862 Personal history of diseases of the blood and blood-forming organs and certain disorders involving the immune mechanism: Secondary | ICD-10-CM | POA: Insufficient documentation

## 2015-04-06 DIAGNOSIS — R0789 Other chest pain: Secondary | ICD-10-CM | POA: Insufficient documentation

## 2015-04-06 DIAGNOSIS — Z79899 Other long term (current) drug therapy: Secondary | ICD-10-CM | POA: Diagnosis not present

## 2015-04-06 DIAGNOSIS — Z87440 Personal history of urinary (tract) infections: Secondary | ICD-10-CM | POA: Insufficient documentation

## 2015-04-06 DIAGNOSIS — M25512 Pain in left shoulder: Secondary | ICD-10-CM | POA: Insufficient documentation

## 2015-04-06 DIAGNOSIS — Z853 Personal history of malignant neoplasm of breast: Secondary | ICD-10-CM | POA: Diagnosis not present

## 2015-04-06 DIAGNOSIS — M79644 Pain in right finger(s): Secondary | ICD-10-CM | POA: Diagnosis not present

## 2015-04-06 DIAGNOSIS — Z86018 Personal history of other benign neoplasm: Secondary | ICD-10-CM | POA: Insufficient documentation

## 2015-04-06 DIAGNOSIS — Z8619 Personal history of other infectious and parasitic diseases: Secondary | ICD-10-CM | POA: Diagnosis not present

## 2015-04-06 DIAGNOSIS — I10 Essential (primary) hypertension: Secondary | ICD-10-CM | POA: Insufficient documentation

## 2015-04-06 DIAGNOSIS — J45909 Unspecified asthma, uncomplicated: Secondary | ICD-10-CM | POA: Insufficient documentation

## 2015-04-06 DIAGNOSIS — G8929 Other chronic pain: Secondary | ICD-10-CM | POA: Diagnosis not present

## 2015-04-06 DIAGNOSIS — Z79818 Long term (current) use of other agents affecting estrogen receptors and estrogen levels: Secondary | ICD-10-CM | POA: Insufficient documentation

## 2015-04-06 LAB — CBC
HCT: 35.2 % — ABNORMAL LOW (ref 36.0–46.0)
HEMOGLOBIN: 10.9 g/dL — AB (ref 12.0–15.0)
MCH: 23.7 pg — ABNORMAL LOW (ref 26.0–34.0)
MCHC: 31 g/dL (ref 30.0–36.0)
MCV: 76.5 fL — AB (ref 78.0–100.0)
PLATELETS: 298 10*3/uL (ref 150–400)
RBC: 4.6 MIL/uL (ref 3.87–5.11)
RDW: 15.2 % (ref 11.5–15.5)
WBC: 5.2 10*3/uL (ref 4.0–10.5)

## 2015-04-06 LAB — BASIC METABOLIC PANEL
Anion gap: 10 (ref 5–15)
BUN: 10 mg/dL (ref 6–20)
CO2: 25 mmol/L (ref 22–32)
CREATININE: 0.7 mg/dL (ref 0.44–1.00)
Calcium: 9 mg/dL (ref 8.9–10.3)
Chloride: 103 mmol/L (ref 101–111)
GFR calc Af Amer: >60 mL/min (ref >60)
GFR calc non Af Amer: >60 mL/min (ref >60)
Glucose, Bld: 95 mg/dL (ref 65–99)
Potassium: 4 mmol/L (ref 3.5–5.1)
SODIUM: 138 mmol/L (ref 135–145)

## 2015-04-06 LAB — I-STAT TROPONIN, ED
TROPONIN I, POC: 0.01 ng/mL (ref 0.00–0.08)
Troponin i, poc: 0 ng/mL (ref 0.00–0.08)

## 2015-04-06 LAB — D-DIMER, QUANTITATIVE (NOT AT ARMC)

## 2015-04-06 MED ORDER — GI COCKTAIL ~~LOC~~: 30.0000 mL | Freq: Once | ORAL | Status: DC

## 2015-04-06 MED ORDER — OXYCODONE-ACETAMINOPHEN 5-325 MG PO TABS
1.0000 | ORAL_TABLET | Freq: Once | ORAL | Status: AC
  Administered 2015-04-06: 1 via ORAL
  Filled 2015-04-06: qty 1

## 2015-04-06 MED ORDER — MORPHINE SULFATE (PF) 4 MG/ML IV SOLN: 4.0000 mg | Freq: Once | INTRAVENOUS | Status: DC

## 2015-04-06 MED ORDER — METHOCARBAMOL 500 MG PO TABS: 1000.0000 mg | ORAL_TABLET | Freq: Four times a day (QID) | ORAL | Status: DC | PRN

## 2015-04-06 MED ORDER — NAPROXEN 250 MG PO TABS: 250.0000 mg | ORAL_TABLET | Freq: Two times a day (BID) | ORAL | Status: DC | PRN

## 2015-04-06 MED ORDER — NITROGLYCERIN 0.4 MG SL SUBL: 0.4000 mg | SUBLINGUAL_TABLET | SUBLINGUAL | Status: DC | PRN

## 2015-04-06 MED ORDER — ASPIRIN 81 MG PO CHEW: 324.0000 mg | CHEWABLE_TABLET | Freq: Once | ORAL | Status: DC

## 2015-04-06 MED ORDER — HYDROCODONE-ACETAMINOPHEN 5-325 MG PO TABS: ORAL_TABLET | ORAL | Status: DC
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
## 2015-04-06 NOTE — Discharge Instructions (Signed)
°Emergency Department Resource Guide °1) Find a Doctor and Pay Out of Pocket °Although you won't have to find out who is covered by your insurance plan, it is a good idea to ask around and get recommendations. You will then need to call the office and see if the doctor you have chosen will accept you as a new patient and what types of options they offer for patients who are self-pay. Some doctors offer discounts or will set up payment plans for their patients who do not have insurance, but you will need to ask so you aren't surprised when you get to your appointment. ° °2) Contact Your Local Health Department °Not all health departments have doctors that can see patients for sick visits, but many do, so it is worth a call to see if yours does. If you don't know where your local health department is, you can check in your phone book. The CDC also has a tool to help you locate your state's health department, and many state websites also have listings of all of their local health departments. ° °3) Find a Walk-in Clinic °If your illness is not likely to be very severe or complicated, you may want to try a walk in clinic. These are popping up all over the country in pharmacies, drugstores, and shopping centers. They're usually staffed by nurse practitioners or physician assistants that have been trained to treat common illnesses and complaints. They're usually fairly quick and inexpensive. However, if you have serious medical issues or chronic medical problems, these are probably not your best option. ° °No Primary Care Doctor: °- Call Health Connect at  832-8000 - they can help you locate a primary care doctor that  accepts your insurance, provides certain services, etc. °- Physician Referral Service- 1-800-533-3463 ° °Chronic Pain Problems: °Organization         Address  Phone   Notes  °[facility]  (336) [phone] Patients need to be referred by their primary care doctor.  ° °Medication  Assistance: °Organization         Address  Phone   Notes  °[place] [facility] [address] °[place], [place] [address] (336) 641-8030 --Must be a resident of [place] °-- Must have NO insurance coverage whatsoever (no Medicaid/ Medicare, etc.) °-- The pt. MUST have a primary care doctor that directs their care regularly and follows them in the community °  °[facility]  (866) 331-1348   °[employer]  (888) 892-1162   ° °Agencies that provide inexpensive medical care: °Organization         Address  Phone   Notes  °[facility]  (336) 832-8035   °[facility]    (336) 832-7272   °[facility] [address] °[place], [place] [address] (336) 832-4777   °[facility] [address], °[place] (336) 271-4999   °[facility]    (336) 373-0678   °[facility]    (336) 272-1050   °[facility] ° [address], [place] Phone:  (336) 832-4444, Fax:  (336) 832-4440 Hours of Operation:  9 am - 6 pm, M-F.  Also accepts Medicaid/Medicare and self-pay.  °[facility] ° [address], [place] Phone: (336) 832-3150, Fax: (336) [phone]. Hours of Operation:  8:30 am - 5:30 pm, M-F.  Also accepts Medicaid and self-pay.  °[facility] [place] [address], [place] Phone: (336) 878-6027   °[facility] [address], [place], [place] (336)723-1848, Ext. 123 Mondays & Thursdays: 7-9 AM.  First 15 patients are seen on a first come, first serve basis. °  ° °Medicaid-accepting [place] Providers: ° °Organization         Address  Phone   Notes  °[facility] [address], [place] (336) 641-2100 Also accepts self-pay patients.  °[facility] [address], [place] ° (336) 856-9996   °[facility] [address], [place]  (336) 288-8857   °[facility] [address] [place] [address], [place] (336) 299-7000   °Veita Bland [address], [place]  ° (336) 373-1557 Only accepts [place] Access Medicaid patients after they have their name applied to their card.  ° °Self-Pay (no insurance) in [place]: ° °Organization         Address  Phone   Notes  °Sickle Cell Patients, [facility] [address], [place] (336) 832-1970   °[facility] [address], [place] (336) 832-4400   °[facility] ° [address], [place] (336) 992-4800   °[facility]/Dr. Osei-Bonsu ° [address] [place] [address], [place] or [address], [place] (336) [phone] Phone number for both [place] and [place] locations is the same.  °[facility] [address], [place] (336) 299-0000   °[facility] [address] [place] [address], [place] or [address] (336) 852-7530 °(336) 878-2260   °[facility] [address], [place] (336) 350-1642, phone; (336) 294-5005, fax Sees patients 1st and 3rd Saturday of every month.  Must not qualify for public or private insurance (i.e. Medicaid, Medicare, [place] Health Choice, Veterans' Benefits) • Household income should be no more than 200% of the poverty level •The clinic cannot treat you if you are pregnant or think you are pregnant • Sexually transmitted diseases are not treated at the clinic.  ° ° °Dental Care: °Organization         Address  Phone  Notes  °[place] [facility] [address], [place] (336) 641-6152 Accepts children up to age 21 who are enrolled in Medicaid or [place] Health Choice; pregnant women with a Medicaid card; and children who have applied for Medicaid or [place] Health Choice, but were declined, whose parents can pay a reduced fee at time of service.  °[place]  [facility] [place]  [address], [place] (336) 641-7733 Accepts children up to age 21 who are enrolled in Medicaid or [place] Health Choice; pregnant women with a Medicaid card; and children who have applied for Medicaid or  Health Choice, but were declined, whose parents can pay a reduced fee at time of service.  °[facility] ° [address],  (336) 641-4533 Patients are seen by appointment only. Walk-ins are not accepted. [facility] will see patients 18 years of age and older. °Monday - Tuesday (8am-5pm) °Most Wednesdays (8:30-5pm) °$30 per visit, cash only  °[facility] ° [address], [place] (336) [phone] Patients are seen by appointment only. Walk-ins are not accepted. [facility] will see patients 18 years of age and older. °One   Wednesday Evening (Monthly: Volunteer Based).  $30 per visit, cash only  °[facility]  (919) 537-3737 for adults; Children under age 4, call Graduate Pediatric Dentistry at ([phone]) [phone]. Children aged 4-14, please call ([phone]) [phone] to request a pediatric application. ° Dental services are provided in all areas of dental care including fillings, crowns and bridges, complete and partial dentures, implants, gum treatment, root canals, and extractions. Preventive care is also provided. Treatment is provided to both adults and children. °Patients are selected via a lottery and there is often a waiting list. °  °[facility] [address], °[place] ° (336) 763-8833 [email or web address].[email or web address] °  °[facility] [address], [place], [place] (336)723-1848, Ext. 123 Second and Fourth Thursday of each month, opens at 6:30 AM; Clinic ends at 9 AM.  Patients are seen on a first-come first-served basis, and a limited number are seen during each clinic.  ° °[facility] ° [address], [place], [place] (336) 723-7904    Eligibility Requirements °You must have lived in [place], [place], or [place] counties for at least the last three months. °  You cannot be eligible for state or federal sponsored healthcare insurance, including Veterans Administration, Medicaid, or Medicare. °  You generally cannot be eligible for healthcare insurance through your employer.  °  How to apply: °Eligibility screenings are held every Tuesday and Wednesday afternoon from 1:00 pm until 4:00 pm. You do not need an appointment for the interview!  °[facility] [address], [place], [place] 336-631-2330   °[facility]  336-342-8273   °[facility]  336-703-3100   °[facility]  336-570-6415   ° °Behavioral Health Resources in the Community: °Intensive Outpatient Programs °Organization         Address  Phone  Notes  °[facility] [address], [place], [place] 336-878-6098   °[facility] [address], [place], [place] 336-832-9800   °[facility] [address], [place], [place] ° 336-882-2125   °[facility] [address],  °[place], [place] 1-800-853-5163 or 336-641-4981   °Substance Abuse Resources °Organization         Address  Phone  Notes  °[facility]  336-882-2125   °[facility]  336-784-9470   °[facility]  336-285-9073   °[facility]  336-845-3988   °Residential & Outpatient Substance Abuse Program  1-800-659-3381   °Psychological Services °Organization         Address  Phone  Notes  °[facility]  336- 832-9600   °[facility]  336- 378-7881   °[facility] [address], [place] 1-800-853-5163 or 336-641-4981   ° °Mobile Crisis Teams °Organization         Address  Phone  Notes  °[facility], Mobile Crisis Care Unit  1-877-626-1772   °Assertive °[facility] ° [address]  [place], [place] 336-834-9664   °Sharon DeEsch [address] °[place] 336-554-5454   ° °Self-Help/Support Groups °Organization         Address  Phone             Notes  °[facility] [place] - variety of support groups  336- 373-1402 Call for more information  °Narcotics Anonymous (NA), [facility] [address], °[place]  2 meetings at this location  ° °  Residential Treatment Programs Organization         Address  Phone  Notes  [facility] [address]    [place]  1-714-799-1421   [facility]  [address], [place] [id number], [place], [place]   [place], [place] (320)219-7310 Admissions: 8am-3pm M-F  [facility] [address],    [place], [place] X4321937   [facility] [address] [place], [place], [place]   [facility] [address],  [place], [place]   [facility] - Intensive Outpatient [address], [place], [place]   [facility]  [place], [place] 1-782 291 7912 or 410-337-2082   [facility] [address], [place], [place] Accepts Medicaid  [facility] [address]  [place] 1-(667) 058-9061 Substance Abuse/Addiction Treatment   [facility]         Address  Phone  Notes  [facility]  (223)651-1234   Domenic Schwab, PhD [address] Arlis [place], [place]   640-524-3025 or [phone]   [facility] [place], [place] 607-532-4702   [facility] [address], [place], [place] 760-609-0055 Insurance/Medicaid/sponsorship through [facility] and Families [address] [place]                                    [place], [place] [phone] [facility] [address], [place] [phone]    Dr. Adele Schilder  ([phone])[phone]   [facility] Dept. 1) [address] [place] 2) [place] 3)  [place] [address], [place] [phone]  9298494135   [place] [phone] or (224) [phone] (After Hours)      Take the prescriptions as directed.  Apply moist heat or ice to the area(s) of discomfort, for 15 minutes at a time, several times per day for the next few days.  Do not fall asleep on a heating or ice pack.  Call your regular medical doctor and your Orthopedic doctor today to schedule a follow up appointment this week.  Return to the Emergency Department immediately if worsening.
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
## 2015-04-06 NOTE — ED Notes (Signed)
Patient in xray 

## 2015-04-06 NOTE — ED Provider Notes (Signed)
CSN: [id number]     Arrival date & time 04/06/15  1642 History   First MD Initiated Contact with Patient 04/06/15 1711     Chief Complaint  Patient presents with  . Shoulder Pain  . Hand Pain     HPI  Pt was seen at 2200. Per pt, c/o gradual onset and persistence of constant acute flair of her chronic left shoulder "pain" for the past 7 months. Has been associated with left upper chest wall "pain" for the past several weeks. Pt also c/o acute flair of her chronic right thumb "pain" that began today after she "picked up a heavy bag today." Describes her pain as "sharp," and "throbbing." Pt also c/o cough "for a while." Denies palpitations, no SOB, no abd pain, no N/V/D, no focal motor weakness, no tingling/numbness in extremities, no neck or back pain.    Past Medical History  Diagnosis Date  . Hypertension   . Iron deficiency   . Bronchiectasis   . Urinary tract infection   . Fibroid   . Chlamydia   . Anemia   . 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 Radiation 03/07/13-04/01/13    Right breast  50 Gy  . Asthma   . Chronic shoulder pain   . Chronic thumb pain     bilateral   Past Surgical History  Procedure Laterality Date  . Cesarean section  01/05/2009  . Biopsy breast Left 12/05/12    fibroadenoma  . Breast surgery    . Breast lumpectomy      2014  . Dilitation & currettage/hystroscopy with hydrothermal ablation N/A 08/12/2013    Procedure: DILATATION & CURETTAGE/HYSTEROSCOPY WITH HYDROTHERMAL ABLATION;  Surgeon: Frederico Hamman, MD;  Location: [facility];  Service: Gynecology;  Laterality: N/A;   Family History  Problem Relation Age of Onset  . Anesthesia problems Neg Hx   . Hypertension Mother   . Diabetes Mother   . Breast cancer Paternal Grandmother     dx in her 10s  . Prostate cancer Paternal Grandfather   . Prostate cancer Father 66  . Prostate cancer Paternal Uncle   . Breast cancer Other     paternal grandmother's sister   Social History   Substance Use Topics  . Smoking status: Never Smoker   . Smokeless tobacco: Never Used  . Alcohol Use: 0.6 oz/week    1 Cans of beer per week     Comment: occa   OB History    Gravida Para Term Preterm AB TAB SAB Ectopic Multiple Living   1 1 1  0 0 0 0 0 0 1     Review of Systems ROS: Statement: All systems negative except as marked or noted in the HPI; Constitutional: Negative for fever and chills. ; ; Eyes: Negative for eye pain, redness and discharge. ; ; ENMT: Negative for ear pain, hoarseness, nasal congestion, sinus pressure and sore throat. ; ; Cardiovascular: Negative for palpitations, diaphoresis, dyspnea and peripheral edema. ; ; Respiratory: +cough. Negative for wheezing and stridor. ; ; Gastrointestinal: Negative for nausea, vomiting, diarrhea, abdominal pain, blood in stool, hematemesis, jaundice and rectal bleeding. . ; ; Genitourinary: Negative for dysuria, flank pain and hematuria. ; ; Musculoskeletal: +chest wall pain, shoulder pain, thumb pain. Negative for back pain and neck pain. Negative for swelling and deformity.; ; Skin: Negative for pruritus, rash, abrasions, blisters, bruising and skin lesion.; ; Neuro: Negative for headache, lightheadedness and neck stiffness. Negative for weakness, altered level  of consciousness , altered mental status, extremity weakness, paresthesias, involuntary movement, seizure and syncope.      Allergies  Shrimp and Shellfish-derived products  Home Medications   Prior to Admission medications   Medication Sig Start Date End Date Taking? Authorizing Provider  albuterol (PROAIR HFA) 108 (90 BASE) MCG/ACT inhaler Inhale 1 puff into the lungs every 6 (six) hours as needed for shortness of breath.    Yes Historical Provider, MD  ibuprofen (ADVIL,MOTRIN) 600 MG tablet Take 1 tablet (600 mg total) by mouth every 6 (six) hours as needed. Patient taking differently: Take 600 mg by mouth every 6 (six) hours as needed for moderate pain.  11/05/13   Yes Seabron Spates, CNM  Multiple Vitamins-Minerals (MULTIVITAMIN PO) Take 1 tablet by mouth daily.   Yes Historical Provider, MD  Olmesartan-Amlodipine-HCTZ (TRIBENZOR) 20-5-12.5 MG TABS Take 1 tablet by mouth every morning. 07/02/12  Yes Nat Christen, MD  SPECIALTY VITAMINS PRODUCTS PO Take 1 capsule by mouth daily. One-A-Day   Yes Historical Provider, MD  tamoxifen (NOLVADEX) 20 MG tablet TAKE 1 TABLET BY MOUTH EVERY DAY 03/23/15  Yes Nicholas Lose, MD  amoxicillin (AMOXIL) 500 MG capsule Take 1 capsule (500 mg total) by mouth 3 (three) times daily. Patient not taking: Reported on 04/06/2015 02/17/14   Harden Mo, MD  oxyCODONE-acetaminophen (PERCOCET/ROXICET) 5-325 MG per tablet Take 1 tablet by mouth every 4 (four) hours as needed for severe pain. Patient not taking: Reported on 04/06/2015 09/03/14   Okey Regal, PA-C   BP 145/89 mmHg  Pulse 90  Temp(Src) 98.2 F (36.8 C) (Oral)  Resp 18  SpO2 98% Physical Exam  2205: Physical examination:  Nursing notes reviewed; Vital signs and O2 SAT reviewed;  Constitutional: Well developed, Well nourished, Well hydrated, In no acute distress; Head:  Normocephalic, atraumatic; Eyes: EOMI, PERRL, No scleral icterus; ENMT: Mouth and pharynx normal, Mucous membranes moist; Neck: Supple, Full range of motion, No lymphadenopathy; Cardiovascular: Regular rate and rhythm, No gallop; Respiratory: Breath sounds clear & equal bilaterally, [place].  Speaking full sentences with ease, Normal respiratory effort/excursion; Chest: +left upper anterior chest wall tender to palp. No rash, no soft tissue crepitus, no deformity. Movement normal; Abdomen: Soft, Nontender, Nondistended, Normal bowel sounds; Genitourinary: No CVA tenderness; Extremities: Pulses normal, No deformity. No edema, No calf edema or asymmetry. Left shoulder w/FROM.  +TTP anterior deltoid area and AC joint area. Left clavicle NT, scapula NT, proximal humerus NT, biceps tendon NT over bicipital groove.   Motor strength at shoulder normal.  Sensation intact over deltoid region, distal NMS intact with left hand having intact and equal sensation and strength in the distribution of the median, radial, and ulnar nerve function compared to opposite side.  Strong radial pulse, brisk cap refill in fingertips.  +FROM left elbow with intact motor strength biceps and triceps muscles to resistance. +mild TTP right thenar eminence with faint ecchymosis, no deformity, no rash, no open wounds..; Neuro: AA&Ox3, Major CN grossly intact.  Speech clear. No gross focal motor or sensory deficits in extremities.; Skin: Color normal, Warm, Dry.   ED Course  Procedures (including critical care time)  Labs Review  Imaging Review  I have personally reviewed and evaluated these images and lab results as part of my medical decision-making.   EKG Interpretation   Date/Time:  Wednesday April 06 2015 17:55:44 EST Ventricular Rate:  100 PR Interval:  160 QRS Duration: 78 QT Interval:  346 QTC Calculation: 446 R Axis:   60  Text Interpretation:  Normal sinus rhythm Possible Left atrial enlargement  Anteroseptal infarct , age undetermined Abnormal ECG When compared with  ECG of 06/05/2013, No significant change was found Confirmed by [facility]  MD,  DAVID ([id number]) on 04/06/2015 6:06:47 PM      MDM  MDM Reviewed: previous chart, nursing note and vitals Reviewed previous: labs and ECG Interpretation: labs, x-ray and ECG      Results for orders placed or performed during the hospital encounter of [id number]  Basic metabolic panel  Result Value Ref Range   Sodium 138 135 - 145 mmol/L   Potassium 4.0 3.5 - 5.1 mmol/L   Chloride 103 101 - 111 mmol/L   CO2 25 22 - 32 mmol/L   Glucose, Bld 95 65 - 99 mg/dL   BUN 10 6 - 20 mg/dL   Creatinine, Ser 0.70 0.44 - 1.00 mg/dL   Calcium 9.0 8.9 - 10.3 mg/dL   GFR calc non Af Amer >60 >60 mL/min   GFR calc Af Amer >60 >60 mL/min   Anion gap 10 5 - 15  CBC  Result Value  Ref Range   WBC 5.2 4.0 - 10.5 K/uL   RBC 4.60 3.87 - 5.11 MIL/uL   Hemoglobin 10.9 (L) 12.0 - 15.0 g/dL   HCT 35.2 (L) 36.0 - 46.0 %   MCV 76.5 (L) 78.0 - 100.0 fL   MCH 23.7 (L) 26.0 - 34.0 pg   MCHC 31.0 30.0 - 36.0 g/dL   RDW 15.2 11.5 - 15.5 %   Platelets 298 150 - 400 K/uL  D-dimer, quantitative  Result Value Ref Range   D-Dimer, Quant <0.27 0.00 - 0.50 ug/mL-FEU  I-stat troponin, ED  Result Value Ref Range   Troponin i, poc 0.01 0.00 - 0.08 ng/mL   Comment 3          I-stat troponin, ED  Result Value Ref Range   Troponin i, poc 0.00 0.00 - 0.08 ng/mL   Comment 3           Dg Chest 2 View 04/06/2015  CLINICAL DATA:  Intermittent chest pain and shortness of breath today. Chest congestion. EXAM: CHEST  2 VIEW COMPARISON:  09/03/2014. FINDINGS: Trachea is midline. Heart size stable. Lungs are low in volume with mild right basilar subsegmental atelectasis and vascular crowding. No airspace consolidation or pleural fluid. IMPRESSION: Minimal right basilar atelectasis. Electronically Signed   By: Lorin Picket M.D.   On: 04/06/2015 17:55   Dg Shoulder Left 04/06/2015  CLINICAL DATA:  Left shoulder pain post lifting heavy bag today EXAM: LEFT SHOULDER - 2+ VIEW COMPARISON:  None. FINDINGS: Three views of left shoulder submitted. No acute fracture or subluxation. Mild degenerative changes AC joint. Glenohumeral joint is preserved. IMPRESSION: No acute fracture or subluxation. Mild degenerative changes AC joint. Electronically Signed   By: Lahoma Crocker M.D.   On: 04/06/2015 17:59   Dg Hand Complete Right 04/06/2015  CLINICAL DATA:  Right palm of hand pain after lifting a heavy bag today, initial encounter. EXAM: RIGHT HAND - COMPLETE 3+ VIEW COMPARISON:  09/03/2014. FINDINGS: No acute osseous or joint abnormality. IMPRESSION: No acute osseous or joint abnormality. Electronically Signed   By: Lorin Picket M.D.   On: 04/06/2015 17:56    2340:  Doubt PE as cause for symptoms with normal  d-dimer and low risk [place].  Doubt ACS as cause for symptoms with normal troponin and unchanged EKG from previous after several weeks of constant symptoms. Long  hx of chronic pain and endorses acute flair of her usual long standing chronic pain today, no change from her usual chronic pain pattern.  Pt encouraged to f/u with her PMD and Ortho MD for good continuity of care and control of her chronic pain.  Pt verb understanding. Dx and testing d/w pt.  Questions answered.  Verb understanding, agreeable to d/c home with outpt f/u.    Francine Graven, DO 04/09/15 2323

## 2015-05-21 ENCOUNTER — Encounter (HOSPITAL_COMMUNITY): Payer: Self-pay | Admitting: Emergency Medicine

## 2015-05-21 ENCOUNTER — Emergency Department (HOSPITAL_COMMUNITY): Payer: BC Managed Care – PPO

## 2015-05-21 ENCOUNTER — Emergency Department (HOSPITAL_COMMUNITY)
Admission: EM | Admit: 2015-05-21 | Discharge: 2015-05-21 | Disposition: A | Discharge status: Home / Self Care | Payer: BC Managed Care – PPO | Attending: Emergency Medicine | Admitting: Emergency Medicine

## 2015-05-21 DIAGNOSIS — Z79818 Long term (current) use of other agents affecting estrogen receptors and estrogen levels: Secondary | ICD-10-CM | POA: Diagnosis not present

## 2015-05-21 DIAGNOSIS — Z86018 Personal history of other benign neoplasm: Secondary | ICD-10-CM | POA: Insufficient documentation

## 2015-05-21 DIAGNOSIS — Z862 Personal history of diseases of the blood and blood-forming organs and certain disorders involving the immune mechanism: Secondary | ICD-10-CM | POA: Diagnosis not present

## 2015-05-21 DIAGNOSIS — R509 Fever, unspecified: Secondary | ICD-10-CM | POA: Diagnosis present

## 2015-05-21 DIAGNOSIS — B349 Viral infection, unspecified: Secondary | ICD-10-CM

## 2015-05-21 DIAGNOSIS — Z853 Personal history of malignant neoplasm of breast: Secondary | ICD-10-CM | POA: Diagnosis not present

## 2015-05-21 DIAGNOSIS — J45909 Unspecified asthma, uncomplicated: Secondary | ICD-10-CM | POA: Diagnosis not present

## 2015-05-21 DIAGNOSIS — Z8619 Personal history of other infectious and parasitic diseases: Secondary | ICD-10-CM | POA: Diagnosis not present

## 2015-05-21 DIAGNOSIS — Z79899 Other long term (current) drug therapy: Secondary | ICD-10-CM | POA: Insufficient documentation

## 2015-05-21 DIAGNOSIS — I10 Essential (primary) hypertension: Secondary | ICD-10-CM | POA: Diagnosis not present

## 2015-05-21 DIAGNOSIS — G8929 Other chronic pain: Secondary | ICD-10-CM | POA: Insufficient documentation

## 2015-05-21 DIAGNOSIS — Z87440 Personal history of urinary (tract) infections: Secondary | ICD-10-CM | POA: Diagnosis not present

## 2015-05-21 LAB — URINALYSIS, ROUTINE W REFLEX MICROSCOPIC
Bilirubin Urine: NEGATIVE
Glucose, UA: NEGATIVE mg/dL
Ketones, ur: NEGATIVE mg/dL
NITRITE: NEGATIVE
PH: 6.5 (ref 5.0–8.0)
Protein, ur: NEGATIVE mg/dL
SPECIFIC GRAVITY, URINE: 1.014 (ref 1.005–1.030)

## 2015-05-21 LAB — COMPREHENSIVE METABOLIC PANEL
ALT: 16 U/L (ref 14–54)
ANION GAP: 11 (ref 5–15)
AST: 20 U/L (ref 15–41)
Albumin: 3.7 g/dL (ref 3.5–5.0)
Alkaline Phosphatase: 28 U/L — ABNORMAL LOW (ref 38–126)
BUN: 11 mg/dL (ref 6–20)
CHLORIDE: 100 mmol/L — AB (ref 101–111)
CO2: 26 mmol/L (ref 22–32)
CREATININE: 0.97 mg/dL (ref 0.44–1.00)
Calcium: 9.4 mg/dL (ref 8.9–10.3)
GFR calc non Af Amer: >60 mL/min (ref >60)
Glucose, Bld: 121 mg/dL — ABNORMAL HIGH (ref 65–99)
Potassium: 3.6 mmol/L (ref 3.5–5.1)
SODIUM: 137 mmol/L (ref 135–145)
Total Bilirubin: <0.1 mg/dL — ABNORMAL LOW (ref 0.3–1.2)
Total Protein: 7 g/dL (ref 6.5–8.1)

## 2015-05-21 LAB — I-STAT CG4 LACTIC ACID, ED: Lactic Acid, Venous: 0.92 mmol/L (ref 0.5–2.0)

## 2015-05-21 LAB — I-STAT BETA HCG BLOOD, ED (MC, WL, AP ONLY)

## 2015-05-21 LAB — CBC
HCT: 36.7 % (ref 36.0–46.0)
Hemoglobin: 11.3 g/dL — ABNORMAL LOW (ref 12.0–15.0)
MCH: 23.1 pg — AB (ref 26.0–34.0)
MCHC: 30.8 g/dL (ref 30.0–36.0)
MCV: 75.1 fL — ABNORMAL LOW (ref 78.0–100.0)
PLATELETS: 235 10*3/uL (ref 150–400)
RBC: 4.89 MIL/uL (ref 3.87–5.11)
RDW: 15.6 % — ABNORMAL HIGH (ref 11.5–15.5)
WBC: 4 10*3/uL (ref 4.0–10.5)

## 2015-05-21 LAB — URINE MICROSCOPIC-ADD ON: WBC UA: NONE SEEN WBC/hpf (ref 0–5)

## 2015-05-21 MED ORDER — SODIUM CHLORIDE 0.9 % IV BOLUS (SEPSIS)
1000.0000 mL | Freq: Once | INTRAVENOUS | Status: AC
  Administered 2015-05-21: 1000 mL via INTRAVENOUS

## 2015-05-21 MED ORDER — ACETAMINOPHEN 325 MG PO TABS
650.0000 mg | ORAL_TABLET | Freq: Once | ORAL | Status: AC
  Administered 2015-05-21: 650 mg via ORAL
  Filled 2015-05-21: qty 2

## 2015-05-21 NOTE — Discharge Instructions (Signed)
Viral Infections °A viral infection can be caused by different types of viruses. Most viral infections are not serious and resolve on their own. However, some infections may cause severe symptoms and may lead to further complications. °SYMPTOMS °Viruses can frequently cause: °· Minor sore throat. °· Aches and pains. °· Headaches. °· Runny nose. °· Different types of rashes. °· Watery eyes. °· Tiredness. °· Cough. °· Loss of appetite. °· Gastrointestinal infections, resulting in nausea, vomiting, and diarrhea. °These symptoms do not respond to antibiotics because the infection is not caused by bacteria. However, you might catch a bacterial infection following the viral infection. This is sometimes called a "superinfection." Symptoms of such a bacterial infection may include: °· Worsening sore throat with pus and difficulty swallowing. °· Swollen neck glands. °· Chills and a high or persistent fever. °· Severe headache. °· Tenderness over the sinuses. °· Persistent overall ill feeling (malaise), muscle aches, and tiredness (fatigue). °· Persistent cough. °· Yellow, green, or brown mucus production with coughing. °HOME CARE INSTRUCTIONS  °· Only take over-the-counter or prescription medicines for pain, discomfort, diarrhea, or fever as directed by your caregiver. °· Drink enough water and fluids to keep your urine clear or pale yellow. Sports drinks can provide valuable electrolytes, sugars, and hydration. °· Get plenty of rest and maintain proper nutrition. Soups and broths with crackers or rice are fine. °SEEK IMMEDIATE MEDICAL CARE IF:  °· You have severe headaches, shortness of breath, chest pain, neck pain, or an unusual rash. °· You have uncontrolled vomiting, diarrhea, or you are unable to keep down fluids. °· You or your child has an oral temperature above 102° F (38.9° C), not controlled by medicine. °· Your baby is older than 3 months with a rectal temperature of 102° F (38.9° C) or higher. °· Your baby is 3  months old or younger with a rectal temperature of 100.4° F (38° C) or higher. °MAKE SURE YOU:  °· Understand these instructions. °· Will watch your condition. °· Will get help right away if you are not doing well or get worse. °  °This information is not intended to replace advice given to you by your health care provider. Make sure you discuss any questions you have with your health care provider. °  °Document Released: 12/13/2004 Document Revised: 05/28/2011 Document Reviewed: 08/11/2014 °Elsevier Interactive Patient Education ©2016 [employer] ° °

## 2015-05-21 NOTE — ED Notes (Signed)
Started feeling like she had a cold on Monday. Went to the doctor on Thurs, but her cough and breathing became more difficult today. Able to eat & drink. No N/V/D.

## 2015-05-21 NOTE — ED Provider Notes (Signed)
CSN: [id number]     Arrival date & time 05/21/15  0553 History   First MD Initiated Contact with Patient 05/21/15 [phone]     Chief Complaint  Patient presents with  . Fever   HPI   45 year old female presents today with complaints of upper respiratory infection. Patient reports symptoms started approximately 6 days ago with rhinorrhea, dry nonproductive cough and shortness of breath. Patient reports that she used her inhaler at home which provided significant relief. She followed up with her primary care 2 days ago was diagnosed with viral upper respiratory infection and given cough medication. She reports that symptoms and continue persist, followed up yesterday again as she needed a work note. Patient notes she's been afebrile until time of presentation to the ED, states that cough and congestion has continued to persist with worsening over the last 2 days, and describes "heart fluttering this morning in addition to productive cough. Patient reports that she has been able to eat and drink appropriately, denies any chest pain, current shortness of breath, abdominal pain, rash, recent travel. Patient denies any significant cardiac history, notes that she is borderline for hyperlipidemia and diabetes, currently taking medication for hypertension and diabetes.   Past Medical History  Diagnosis Date  . Hypertension   . Iron deficiency   . Bronchiectasis   . Urinary tract infection   . Fibroid   . Chlamydia   . Anemia   . 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 Radiation 03/07/13-04/01/13    Right breast  50 Gy  . Asthma   . Chronic shoulder pain   . Chronic thumb pain     bilateral   Past Surgical History  Procedure Laterality Date  . Cesarean section  01/05/2009  . Biopsy breast Left 12/05/12    fibroadenoma  . Breast surgery    . Breast lumpectomy      2014  . Dilitation & currettage/hystroscopy with hydrothermal ablation N/A 08/12/2013    Procedure: DILATATION &  CURETTAGE/HYSTEROSCOPY WITH HYDROTHERMAL ABLATION;  Surgeon: Frederico Hamman, MD;  Location: [facility];  Service: Gynecology;  Laterality: N/A;   Family History  Problem Relation Age of Onset  . Anesthesia problems Neg Hx   . Hypertension Mother   . Diabetes Mother   . Breast cancer Paternal Grandmother     dx in her 82s  . Prostate cancer Paternal Grandfather   . Prostate cancer Father 65  . Prostate cancer Paternal Uncle   . Breast cancer Other     paternal grandmother's sister   Social History  Substance Use Topics  . Smoking status: Never Smoker   . Smokeless tobacco: Never Used  . Alcohol Use: 0.6 oz/week    1 Cans of beer per week     Comment: occa   OB History    Gravida Para Term Preterm AB TAB SAB Ectopic Multiple Living   1 1 1  0 0 0 0 0 0 1     Review of Systems  All other systems reviewed and are negative.   Allergies  Shrimp and Shellfish-derived products  Home Medications   Prior to Admission medications   Medication Sig Start Date End Date Taking? Authorizing Provider  albuterol (PROAIR HFA) 108 (90 BASE) MCG/ACT inhaler Inhale 1 puff into the lungs every 6 (six) hours as needed for shortness of breath.    Yes Historical Provider, MD  HYDROcodone-acetaminophen (NORCO/VICODIN) 5-325 MG tablet 1 or 2 tabs PO q6 hours prn pain  04/06/15  Yes Francine Graven, DO  ibuprofen (ADVIL,MOTRIN) 600 MG tablet Take 1 tablet (600 mg total) by mouth every 6 (six) hours as needed. Patient taking differently: Take 600 mg by mouth every 6 (six) hours as needed for moderate pain.  11/05/13  Yes Seabron Spates, CNM  Multiple Vitamins-Minerals (MULTIVITAMIN PO) Take 1 tablet by mouth daily.   Yes Historical Provider, MD  Olmesartan-Amlodipine-HCTZ (TRIBENZOR) 20-5-12.5 MG TABS Take 1 tablet by mouth every morning. 07/02/12  Yes Nat Christen, MD  SPECIALTY VITAMINS PRODUCTS PO Take 1 capsule by mouth daily. One-A-Day   Yes Historical Provider, MD  tamoxifen (NOLVADEX) 20 MG tablet  TAKE 1 TABLET BY MOUTH EVERY DAY 03/23/15  Yes Nicholas Lose, MD   BP 139/84 mmHg  Pulse 94  Temp(Src) 99.6 F (37.6 C) (Oral)  Resp 16  Ht 5\' 3"  (1.6 m)  Wt 101.152 kg  BMI 39.51 kg/m2  SpO2 99%  LMP 05/21/2015 (Exact Date)   Physical Exam  Constitutional: She is oriented to person, place, and time. She appears well-developed and well-nourished.  HENT:  Head: Normocephalic and atraumatic.  Eyes: Conjunctivae are normal. Pupils are equal, round, and reactive to light. Right eye exhibits no discharge. Left eye exhibits no discharge. No scleral icterus.  Neck: Normal range of motion. No JVD present. No tracheal deviation present.  Cardiovascular: Regular rhythm, normal heart sounds and intact distal pulses.  Exam reveals no gallop and no friction rub.   No murmur heard. Pulmonary/Chest: Effort normal and breath sounds normal. No stridor. No respiratory distress. She has no wheezes. She has no rales. She exhibits no tenderness.  Abdominal: Soft. There is no tenderness.  Musculoskeletal: She exhibits no edema or tenderness.  Neurological: She is alert and oriented to person, place, and time. Coordination normal.  Skin: Skin is warm and dry. No rash noted. No erythema. No pallor.  Psychiatric: She has a normal mood and affect. Her behavior is normal. Judgment and thought content normal.  Nursing note and vitals reviewed.   ED Course  Procedures (including critical care time) Labs Review Labs Reviewed  CBC - Abnormal; Notable for the following:    Hemoglobin 11.3 (*)    MCV 75.1 (*)    MCH 23.1 (*)    RDW 15.6 (*)    All other components within normal limits  COMPREHENSIVE METABOLIC PANEL - Abnormal; Notable for the following:    Chloride 100 (*)    Glucose, Bld 121 (*)    Alkaline Phosphatase 28 (*)    Total Bilirubin <0.1 (*)    All other components within normal limits  URINALYSIS, ROUTINE W REFLEX MICROSCOPIC (NOT AT [facility]) - Abnormal; Notable for the following:    Hgb urine  dipstick LARGE (*)    Leukocytes, UA TRACE (*)    All other components within normal limits  URINE MICROSCOPIC-ADD ON - Abnormal; Notable for the following:    Squamous Epithelial / LPF 0-5 (*)    Bacteria, UA RARE (*)    All other components within normal limits  CULTURE, BLOOD (ROUTINE X 2)  CULTURE, BLOOD (ROUTINE X 2)  URINE CULTURE  I-STAT CG4 LACTIC ACID, ED  I-STAT BETA HCG BLOOD, ED ([facility], [facility], [facility] ONLY)    Imaging Review Dg Chest Port 1 View  05/21/2015  CLINICAL DATA:  Acute onset of shortness of breath and irregular heartbeat. Tachycardia. Fever. Initial encounter. EXAM: PORTABLE CHEST 1 VIEW COMPARISON:  Chest radiograph performed 04/06/2015 FINDINGS: The lungs are hypoexpanded. Pulmonary vascularity  is at the upper limits of normal. Minimal bibasilar atelectasis is noted. There is no evidence of pleural effusion or pneumothorax. The cardiomediastinal silhouette is within normal limits. No acute osseous abnormalities are seen. IMPRESSION: Lungs hypoexpanded.  Minimal bibasilar atelectasis noted. Electronically Signed   By: Garald Balding M.D.   On: 05/21/2015 06:58   I have personally reviewed and evaluated these images and lab results as part of my medical decision-making.   EKG Interpretation   Date/Time:  Saturday May 21 2015 06:03:43 EST Ventricular Rate:  135 PR Interval:  140 QRS Duration: 78 QT Interval:  276 QTC Calculation: 414 R Axis:   94 Text Interpretation:  Sinus tachycardia Rightward axis Confirmed by Ashok Cordia   MD, Lennette Bihari ([id number]) on 05/21/2015 6:07:54 AM      MDM   Final diagnoses:  Viral illness    Labs: I-STAT beta-hCG, urinalysis, lactic acid, CBC, CMP- no significant findings  Imaging: DG chest portable- no sig findings  Consults:  Therapeutics: NS, tylenol  Discharge Meds:   Assessment/Plan: Pt presentation most consistent with viral infection with Likely influenza. Nursing staff initially called code sepsis on this female due to her  significant tachycardia and fever. Upon my initial evaluation patient appeared to be no acute distress, was unaware that she was eating running a fever but did note some fluttering of her heart. Patient was given normal saline, Tylenol which both improved her fever, her tachycardia. She has no significant findings on laboratory analysis shows signs of significant infection or any acute abnormality. Her lungs were clear chest x-ray shows no acute infectious process, patient was in no respiratory distress. Patient feeling better with no sensation of heart fluttering, chest pain, shortness of breath, abdominal pain. Patient is tolerating by mouth without difficulty. Patient will be discharged home with instructions to use Tylenol or ibuprofen as needed for fever, monitor for new or worsening signs or symptoms and return immediately to the emergency room if any present. Patient verbalized understanding and agreement to today's plan and no further questions or concerns at time of discharge        Okey Regal, PA-C 05/22/15 0617  Lajean Saver, MD 05/22/15 [phone]

## 2015-05-22 LAB — URINE CULTURE

## 2015-05-26 LAB — CULTURE, BLOOD (ROUTINE X 2)
CULTURE: NO GROWTH
Culture: NO GROWTH

## 2015-09-12 ENCOUNTER — Ambulatory Visit (HOSPITAL_BASED_OUTPATIENT_CLINIC_OR_DEPARTMENT_OTHER): Payer: BC Managed Care – PPO | Admitting: Hematology and Oncology

## 2015-09-12 ENCOUNTER — Telehealth: Payer: Self-pay | Admitting: Hematology and Oncology

## 2015-09-12 ENCOUNTER — Encounter: Payer: Self-pay | Admitting: Hematology and Oncology

## 2015-09-12 VITALS — BP 120/80 | HR 122 | Temp 97.8°F | Resp 18 | Wt 224.2 lb

## 2015-09-12 DIAGNOSIS — D0511 Intraductal carcinoma in situ of right breast: Secondary | ICD-10-CM

## 2015-09-12 DIAGNOSIS — C50411 Malignant neoplasm of upper-outer quadrant of right female breast: Secondary | ICD-10-CM

## 2015-09-12 NOTE — Assessment & Plan Note (Signed)
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 currently on tamoxifen 10 mg daily March 2015  Tamoxifen Toxicities: Denies side effects of tamoxifen therapy. She drinks a lot of water and this prevents muscle cramps and aches.  Surveillance: Mammogram at [facility] January 2017 8 is normal. She also follows up with her surgeon and with radiation oncologist.  RTC in one year for follow-up

## 2015-09-12 NOTE — Progress Notes (Signed)
Patient Care Team: Lucianne Lei, MD as PCP - General (Family Medicine)  DIAGNOSIS: Breast cancer of upper-outer quadrant of right female breast [facility])   Staging form: Breast, AJCC 7th Edition     Clinical: Stage 0 (Tis (DCIS), N0, cM0) - Unsigned       Staging comments: Staged at breast conference 11/26/12.      Pathologic: No stage assigned - Unsigned   SUMMARY OF ONCOLOGIC HISTORY:   Breast cancer of upper-outer quadrant of right female breast ([facility]yl Cox)   11/06/2012 Mammogram  Numerous calcifications appear both linear in morphology and linear in distribution and extend over a large portion of the breast.  The longest area is 6.0 x 1.2 cm.  A second area is 6.0 x 1.4 cm.  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   11/26/2012 Breast MRI upper inner quadrant of the posterior left breast, there is an 8 x 7 x 7 mm circumscribed enhancing mass;    03/25/2013 - 05/02/2013 Radiation Therapy 50 Gy to the right breast    05/19/2013 -  Anti-estrogen oral therapy Tamoxifen 20 mg daily   02/23/2014 Surgery Right Lumpectomy: At [facility]; DCIS, ER/PR Positive    CHIEF COMPLIANT: Follow-up on tamoxifen therapy  INTERVAL HISTORY: Sheryl Cox is a 45 year old with above-mentioned history of right breast cancer currently on tamoxifen therapy. She appears to be doing quite well from the side effects. She denies any hot flashes or myalgias since she started drinking lots of water. She denies any lumps or nodules in the breasts.  REVIEW OF SYSTEMS:   Constitutional: Denies fevers, chills or abnormal weight loss Eyes: Denies blurriness of vision Ears, nose, mouth, throat, and face: Denies mucositis or sore throat Respiratory: Denies cough, dyspnea or wheezes Cardiovascular: Denies palpitation, chest  discomfort Gastrointestinal:  Denies nausea, heartburn or change in bowel habits Skin: Denies abnormal skin rashes Lymphatics: Denies new lymphadenopathy or easy bruising Neurological:Denies numbness, tingling or new weaknesses Behavioral/Psych: Mood is stable, no new changes  Extremities: No lower extremity edema Breast:  denies any pain or lumps or nodules in either breasts All other systems were reviewed with the patient and are negative.  I have reviewed the past medical history, past surgical history, social history and family history with the patient and they are unchanged from previous note.  ALLERGIES:  is allergic to shrimp and shellfish-derived products.  MEDICATIONS:  Current Outpatient Prescriptions  Medication Sig Dispense Refill  . albuterol (PROAIR HFA) 108 (90 BASE) MCG/ACT inhaler Inhale 1 puff into the lungs every 6 (six) hours as needed for shortness of breath.     Marland Kitchen HYDROcodone-acetaminophen (NORCO/VICODIN) 5-325 MG tablet 1 or 2 tabs PO q6 hours prn pain 20 tablet 0  . ibuprofen (ADVIL,MOTRIN) 600 MG tablet Take 1 tablet (600 mg total) by mouth every 6 (six) hours as needed. (Patient taking differently: Take 600 mg by mouth every 6 (six) hours as needed for moderate pain. ) 30 tablet 1  . Multiple Vitamins-Minerals (MULTIVITAMIN PO) Take 1 tablet by mouth daily.    . Olmesartan-Amlodipine-HCTZ (TRIBENZOR) 20-5-12.5 MG TABS Take 1 tablet by mouth every morning. 30 tablet 1  . SPECIALTY VITAMINS PRODUCTS PO Take 1 capsule by mouth daily. One-A-Day    . tamoxifen (NOLVADEX) 20 MG tablet TAKE 1 TABLET BY MOUTH EVERY DAY 90  tablet 3   No current facility-administered medications for this visit.    PHYSICAL EXAMINATION: ECOG PERFORMANCE STATUS: 0 - Asymptomatic  Filed Vitals:   09/12/15 1353  BP: 120/80  Pulse: 122  Temp: 97.8 F (36.6 C)  Resp: 18   Filed Weights   09/12/15 1353  Weight: 224 lb 3.2 oz (101.696 kg)    GENERAL:alert, no distress and  comfortable SKIN: skin color, texture, turgor are normal, no rashes or significant lesions EYES: normal, Conjunctiva are pink and non-injected, sclera clear OROPHARYNX:no exudate, no erythema and lips, buccal mucosa, and tongue normal  NECK: supple, thyroid normal size, non-tender, without nodularity LYMPH:  no palpable lymphadenopathy in the cervical, axillary or inguinal LUNGS: clear to auscultation and percussion with normal breathing effort HEART: regular rate & rhythm and no murmurs and no lower extremity edema ABDOMEN:abdomen soft, non-tender and normal bowel sounds MUSCULOSKELETAL:no cyanosis of digits and no clubbing  NEURO: alert & oriented x 3 with fluent speech, no focal motor/sensory deficits EXTREMITIES: No lower extremity edema BREAST: No palpable masses or nodules in either right or left breasts. No palpable axillary supraclavicular or infraclavicular adenopathy no breast tenderness or nipple discharge. (exam performed in the presence of a chaperone)  LABORATORY DATA:  I have reviewed the data as listed   Chemistry      Component Value Date/Time   NA 137 05/21/2015 0615   NA 138 11/26/2012 1225   K 3.6 05/21/2015 0615   K 3.6 11/26/2012 1225   CL 100* 05/21/2015 0615   CO2 26 05/21/2015 0615   CO2 28 11/26/2012 1225   BUN 11 05/21/2015 0615   BUN 11.5 11/26/2012 1225   CREATININE 0.97 05/21/2015 0615   CREATININE 0.8 11/26/2012 1225      Component Value Date/Time   CALCIUM 9.4 05/21/2015 0615   CALCIUM 9.8 11/26/2012 1225   ALKPHOS 28* 05/21/2015 0615   ALKPHOS 32* 11/26/2012 1225   AST 20 05/21/2015 0615   AST 13 11/26/2012 1225   ALT 16 05/21/2015 0615   ALT 14 11/26/2012 1225   BILITOT <0.1* 05/21/2015 0615   BILITOT 0.27 11/26/2012 1225       Lab Results  Component Value Date   WBC 4.0 05/21/2015   HGB 11.3* 05/21/2015   HCT 36.7 05/21/2015   MCV 75.1* 05/21/2015   PLT 235 05/21/2015   NEUTROABS 3.8 11/05/2013   ASSESSMENT & PLAN:  Breast  cancer of upper-outer quadrant of right female breast Right breast DCIS ER/PR positive status post lumpectomy at [facility] currently on tamoxifen 10 mg daily March 2015  Tamoxifen Toxicities: Denies side effects of tamoxifen therapy. She drinks a lot of water and this prevents muscle cramps and aches.  Surveillance: Mammogram at [place] January 2017 8 is normal. She also follows up with her surgeon and with radiation oncologist.  RTC in one year for follow-up  No orders of the defined types were placed in this encounter.   The patient has a good understanding of the overall plan. she agrees with it. she will call with any problems that may develop before the next visit here.   Rulon Eisenmenger, MD 09/12/2015

## 2015-09-12 NOTE — Telephone Encounter (Signed)
Gave patient avs report and appointment for June 2018.

## 2015-11-25 LAB — CYTOLOGY - PAP

## 2015-12-02 LAB — GLUCOSE, POCT (MANUAL RESULT ENTRY): POC Glucose: 95 mg/dl (ref 70–99)

## 2015-12-18 ENCOUNTER — Emergency Department (HOSPITAL_COMMUNITY): Payer: Medicaid Other

## 2015-12-18 ENCOUNTER — Emergency Department (HOSPITAL_COMMUNITY)
Admission: EM | Admit: 2015-12-18 | Discharge: 2015-12-18 | Disposition: A | Discharge status: Home / Self Care | Payer: Medicaid Other | Attending: Emergency Medicine | Admitting: Emergency Medicine

## 2015-12-18 ENCOUNTER — Encounter (HOSPITAL_COMMUNITY): Payer: Self-pay

## 2015-12-18 DIAGNOSIS — K8020 Calculus of gallbladder without cholecystitis without obstruction: Secondary | ICD-10-CM | POA: Diagnosis not present

## 2015-12-18 DIAGNOSIS — R1030 Lower abdominal pain, unspecified: Secondary | ICD-10-CM | POA: Diagnosis present

## 2015-12-18 DIAGNOSIS — Z853 Personal history of malignant neoplasm of breast: Secondary | ICD-10-CM | POA: Diagnosis not present

## 2015-12-18 DIAGNOSIS — J45909 Unspecified asthma, uncomplicated: Secondary | ICD-10-CM | POA: Insufficient documentation

## 2015-12-18 DIAGNOSIS — I10 Essential (primary) hypertension: Secondary | ICD-10-CM | POA: Diagnosis not present

## 2015-12-18 LAB — COMPREHENSIVE METABOLIC PANEL
ALT: 16 U/L (ref 14–54)
AST: 22 U/L (ref 15–41)
Albumin: 4 g/dL (ref 3.5–5.0)
Alkaline Phosphatase: 26 U/L — ABNORMAL LOW (ref 38–126)
Anion gap: 10 (ref 5–15)
BILIRUBIN TOTAL: 0.2 mg/dL — AB (ref 0.3–1.2)
BUN: 10 mg/dL (ref 6–20)
CHLORIDE: 103 mmol/L (ref 101–111)
CO2: 25 mmol/L (ref 22–32)
Calcium: 9.2 mg/dL (ref 8.9–10.3)
Creatinine, Ser: 0.83 mg/dL (ref 0.44–1.00)
Glucose, Bld: 116 mg/dL — ABNORMAL HIGH (ref 65–99)
POTASSIUM: 3.7 mmol/L (ref 3.5–5.1)
Sodium: 138 mmol/L (ref 135–145)
TOTAL PROTEIN: 7.6 g/dL (ref 6.5–8.1)

## 2015-12-18 LAB — URINALYSIS, ROUTINE W REFLEX MICROSCOPIC
Bilirubin Urine: NEGATIVE
GLUCOSE, UA: NEGATIVE mg/dL
Ketones, ur: NEGATIVE mg/dL
Leukocytes, UA: NEGATIVE
NITRITE: NEGATIVE
PH: 6 (ref 5.0–8.0)
Protein, ur: NEGATIVE mg/dL

## 2015-12-18 LAB — CBC WITH DIFFERENTIAL/PLATELET
BASOS ABS: 0 10*3/uL (ref 0.0–0.1)
Basophils Relative: 0 %
EOS ABS: 0 10*3/uL (ref 0.0–0.7)
Eosinophils Relative: 0 %
HEMATOCRIT: 35.1 % — AB (ref 36.0–46.0)
HEMOGLOBIN: 10.7 g/dL — AB (ref 12.0–15.0)
LYMPHS ABS: 1.4 10*3/uL (ref 0.7–4.0)
LYMPHS PCT: 12 %
MCH: 22.6 pg — ABNORMAL LOW (ref 26.0–34.0)
MCHC: 30.5 g/dL (ref 30.0–36.0)
MCV: 74.2 fL — ABNORMAL LOW (ref 78.0–100.0)
MONOS PCT: 5 %
Monocytes Absolute: 0.6 10*3/uL (ref 0.1–1.0)
NEUTROS ABS: 9.7 10*3/uL — AB (ref 1.7–7.7)
Neutrophils Relative %: 83 %
Platelets: 353 10*3/uL (ref 150–400)
RBC: 4.73 MIL/uL (ref 3.87–5.11)
RDW: 16.4 % — AB (ref 11.5–15.5)
WBC: 11.7 10*3/uL — AB (ref 4.0–10.5)

## 2015-12-18 LAB — URINE MICROSCOPIC-ADD ON
BACTERIA UA: NONE SEEN
RBC / HPF: NONE SEEN RBC/hpf (ref 0–5)
WBC, UA: NONE SEEN WBC/hpf (ref 0–5)

## 2015-12-18 LAB — LIPASE, BLOOD: LIPASE: 31 U/L (ref 11–51)

## 2015-12-18 LAB — PREGNANCY, URINE: PREG TEST UR: NEGATIVE

## 2015-12-18 MED ORDER — SODIUM CHLORIDE 0.9 % IV BOLUS (SEPSIS)
1000.0000 mL | Freq: Once | INTRAVENOUS | Status: AC
  Administered 2015-12-18: 1000 mL via INTRAVENOUS

## 2015-12-18 MED ORDER — IOPAMIDOL (ISOVUE-300) INJECTION 61%
INTRAVENOUS | Status: AC
  Administered 2015-12-18: 100 mL
  Filled 2015-12-18: qty 100

## 2015-12-18 MED ORDER — HYDROCODONE-ACETAMINOPHEN 5-325 MG PO TABS: 1.0000 | ORAL_TABLET | ORAL | 0 refills | Status: DC | PRN

## 2015-12-18 MED ORDER — MORPHINE SULFATE (PF) 4 MG/ML IV SOLN
4.0000 mg | Freq: Once | INTRAVENOUS | Status: AC
  Administered 2015-12-18: 4 mg via INTRAVENOUS
  Filled 2015-12-18: qty 1

## 2015-12-18 MED ORDER — ONDANSETRON HCL 4 MG/2ML IJ SOLN
4.0000 mg | Freq: Once | INTRAMUSCULAR | Status: AC
  Administered 2015-12-18: 4 mg via INTRAVENOUS
  Filled 2015-12-18: qty 2

## 2015-12-18 MED ORDER — SODIUM CHLORIDE 0.9 % IV SOLN: INTRAVENOUS | Status: DC

## 2015-12-18 MED ORDER — ONDANSETRON 4 MG PO TBDP: 4.0000 mg | ORAL_TABLET | Freq: Three times a day (TID) | ORAL | 0 refills | Status: DC | PRN

## 2015-12-18 NOTE — ED Notes (Signed)
Patient transported to CT 

## 2015-12-18 NOTE — ED Provider Notes (Signed)
[place] DEPT Provider Note   CSN: [id number] Arrival date & time: 12/18/15  [id number]     History   Chief Complaint Chief Complaint  Patient presents with  . Abdominal Pain    HPI Sheryl Cox is a 45 y.o. female.  Pt said that she developed sharp lower abdominal pain last night.  She has had some diarrhea, but no n/v or f/c.  The pt has never had anything like this in the past.      Past Medical History:  Diagnosis Date  . Anemia   . Asthma   . Breast cancer ([place]) 11/2012   DCIS-ER+, PR+  . Bronchiectasis   . Chlamydia   . Chronic shoulder pain   . Chronic thumb pain    bilateral  . Fibroid   . Hypertension   . Iron deficiency   . Radiation 03/07/13-04/01/13   Right breast  50 Gy  . Urinary tract infection     Patient Active Problem List   Diagnosis Date Noted  . Anemia 06/05/2013  . Menorrhagia 06/05/2013  . 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 Breast cancer of upper-outer quadrant of right female breast ([place]) 11/24/2012  . Nabothian cyst 12/07/2011  . Cervical mass 11/20/2011  . Fibroid uterus 11/20/2011    Past Surgical History:  Procedure Laterality Date  . BIOPSY BREAST Left 12/05/12   fibroadenoma  . BREAST LUMPECTOMY     2014  . BREAST SURGERY    . CESAREAN SECTION  01/05/2009  . DILITATION & CURRETTAGE/HYSTROSCOPY WITH HYDROTHERMAL ABLATION N/A 08/12/2013   Procedure: DILATATION & CURETTAGE/HYSTEROSCOPY WITH HYDROTHERMAL ABLATION;  Surgeon: Frederico Hamman, MD;  Location: [facility];  Service: Gynecology;  Laterality: N/A;    OB History    Gravida Para Term Preterm AB Living   1 1 1  0 0 1   SAB TAB Ectopic Multiple Live Births   0 0 0 0 1       Home Medications    Prior to Admission medications   Medication Sig Start Date End Date Taking? Authorizing Provider  albuterol (PROAIR HFA) 108 (90 BASE) MCG/ACT inhaler Inhale 1 puff into the lungs every 6 (six) hours as needed for shortness of breath.     Historical Provider,  MD  HYDROcodone-acetaminophen (NORCO/VICODIN) 5-325 MG tablet Take 1 tablet by mouth every 4 (four) hours as needed. 12/18/15   Isla Pence, MD  ibuprofen (ADVIL,MOTRIN) 600 MG tablet Take 1 tablet (600 mg total) by mouth every 6 (six) hours as needed. Patient taking differently: Take 600 mg by mouth every 6 (six) hours as needed for moderate pain.  11/05/13   Seabron Spates, CNM  Multiple Vitamins-Minerals (MULTIVITAMIN PO) Take 1 tablet by mouth daily.    Historical Provider, MD  Olmesartan-Amlodipine-HCTZ (TRIBENZOR) 20-5-12.5 MG TABS Take 1 tablet by mouth every morning. 07/02/12   Nat Christen, MD  ondansetron (ZOFRAN ODT) 4 MG disintegrating tablet Take 1 tablet (4 mg total) by mouth every 8 (eight) hours as needed for nausea or vomiting. 12/18/15   Isla Pence, MD  SPECIALTY VITAMINS PRODUCTS PO Take 1 capsule by mouth daily. One-A-Day    Historical Provider, MD  tamoxifen (NOLVADEX) 20 MG tablet TAKE 1 TABLET BY MOUTH EVERY DAY 03/23/15   Nicholas Lose, MD    Family History Family History  Problem Relation Age of Onset  . Hypertension Mother   . Diabetes Mother   . Breast cancer Paternal Grandmother     dx in her 27s  .  Prostate cancer Paternal Grandfather   . Prostate cancer Father 65  . Prostate cancer Paternal Uncle   . Breast cancer Other     paternal grandmother's sister  . Anesthesia problems Neg Hx     Social History Social History  Substance Use Topics  . Smoking status: Never Smoker  . Smokeless tobacco: Never Used  . Alcohol use 0.6 oz/week    1 Cans of beer per week     Comment: occa     Allergies   Shrimp [shellfish allergy] and Shellfish-derived products   Review of Systems Review of Systems  Gastrointestinal: Positive for abdominal pain and diarrhea.  All other systems reviewed and are negative.    Physical Exam Updated Vital Signs BP 134/95   Pulse 100   Resp 18   Ht 5\' 3"  (1.6 m)   Wt 220 lb (99.8 kg)   LMP 12/02/2015   SpO2 100%   BMI  38.97 kg/m   Physical Exam  Constitutional: She is oriented to person, place, and time. She appears well-developed and well-nourished.  HENT:  Head: Normocephalic and atraumatic.  Right Ear: External ear normal.  Left Ear: External ear normal.  Nose: Nose normal.  Mouth/Throat: Oropharynx is clear and moist.  Eyes: EOM are normal. Pupils are equal, round, and reactive to light.  Neck: Normal range of motion. Neck supple.  Cardiovascular: Normal rate, regular rhythm, normal heart sounds and intact distal pulses.   Pulmonary/Chest: Effort normal and breath sounds normal.  Abdominal: Soft. Bowel sounds are normal. There is generalized tenderness.  Musculoskeletal: Normal range of motion.  Neurological: She is alert and oriented to person, place, and time.  Skin: Skin is warm and dry.  Psychiatric: She has a normal mood and affect. Her behavior is normal. Judgment and thought content normal.  Nursing note and vitals reviewed.    ED Treatments / Results  Labs (all labs ordered are listed, but only abnormal results are displayed) Labs Reviewed  COMPREHENSIVE METABOLIC PANEL - Abnormal; Notable for the following:       Result Value   Glucose, Bld 116 (*)    Alkaline Phosphatase 26 (*)    Total Bilirubin 0.2 (*)    All other components within normal limits  CBC WITH DIFFERENTIAL/PLATELET - Abnormal; Notable for the following:    WBC 11.7 (*)    Hemoglobin 10.7 (*)    HCT 35.1 (*)    MCV 74.2 (*)    MCH 22.6 (*)    RDW 16.4 (*)    Neutro Abs 9.7 (*)    All other components within normal limits  URINALYSIS, ROUTINE W REFLEX MICROSCOPIC (NOT AT [facility]) - Abnormal; Notable for the following:    Specific Gravity, Urine <1.005 (*)    Hgb urine dipstick TRACE (*)    All other components within normal limits  URINE MICROSCOPIC-ADD ON - Abnormal; Notable for the following:    Squamous Epithelial / LPF 0-5 (*)    All other components within normal limits  LIPASE, BLOOD  PREGNANCY,  URINE    EKG  EKG Interpretation None       Radiology Ct Abdomen Pelvis W Contrast  Result Date: 12/18/2015 CLINICAL DATA:  Right-sided abdominal pain beginning last night. EXAM: CT ABDOMEN AND PELVIS WITH CONTRAST TECHNIQUE: Multidetector CT imaging of the abdomen and pelvis was performed using the standard protocol following bolus administration of intravenous contrast. CONTRAST:  135mL ISOVUE-300 IOPAMIDOL (ISOVUE-300) INJECTION 61% COMPARISON:  09/03/2014 FINDINGS: Lower chest: No acute abnormality.  Hepatobiliary: Unremarkable liver. 3.3 cm gallstone. No gallbladder wall thickening or inflammation. No bile duct dilation. Pancreas: Unremarkable. No pancreatic ductal dilatation or surrounding inflammatory changes. Spleen: Normal in size without focal abnormality. Adrenals/Urinary Tract: Adrenal glands are unremarkable. Kidneys are normal, without renal calculi, focal lesion, or hydronephrosis. Bladder is unremarkable. Stomach/Bowel: Stomach is within normal limits. Appendix appears normal. No evidence of bowel wall thickening, distention, or inflammatory changes. Vascular/Lymphatic: No significant vascular findings are present. No enlarged abdominal or pelvic lymph nodes. Reproductive: Uterus is enlarged by multiple fibroids essentially stable from the prior CT. No ovarian/ adnexal masses. Other: No abdominal wall hernia or abnormality. No abdominopelvic ascites. Musculoskeletal: No acute or significant osseous findings. IMPRESSION: 1. No acute findings. 2. Large gallstone with no CT evidence of acute cholecystitis. 3. Multiple uterine fibroids, stable. Electronically Signed   By: [name] M.D.   On: 12/18/2015 10:39    Procedures Procedures (including critical care time)  Medications Ordered in ED Medications  sodium chloride 0.9 % bolus 1,000 mL (0 mLs Intravenous Stopped 12/18/15 1045)    And  0.9 %  sodium chloride infusion (not administered)  ondansetron (ZOFRAN) injection 4 mg (4  mg Intravenous Given 12/18/15 0808)  morphine 4 MG/ML injection 4 mg (4 mg Intravenous Given 12/18/15 0808)  iopamidol (ISOVUE-300) 61 % injection (100 mLs  Contrast Given 12/18/15 1009)  morphine 4 MG/ML injection 4 mg (4 mg Intravenous Given 12/18/15 1049)     Initial Impression / Assessment and Plan / ED Course  I have reviewed the triage vital signs and the nursing notes.  Pertinent labs & imaging results that were available during my care of the patient were reviewed by me and considered in my medical decision making (see chart for details).  Clinical Course   Pt is feeling better.  Her LFTs are ok.  Pt to f/u with general surgery. I told her to avoid fatty and greasy foods.  She knows to return if worse.   Final Clinical Impressions(s) / ED Diagnoses   Final diagnoses:  Gallstones    New Prescriptions New Prescriptions   HYDROCODONE-ACETAMINOPHEN (NORCO/VICODIN) 5-325 MG TABLET    Take 1 tablet by mouth every 4 (four) hours as needed.   ONDANSETRON (ZOFRAN ODT) 4 MG DISINTEGRATING TABLET    Take 1 tablet (4 mg total) by mouth every 8 (eight) hours as needed for nausea or vomiting.     Isla Pence, MD 12/18/15 1051

## 2015-12-18 NOTE — ED Triage Notes (Signed)
Pt c/o sharp lower ABD pain that started last night. Pt does endorse diarrhea, but denies any nausea or vomiting. Pt a&ox4.

## 2016-01-02 ENCOUNTER — Ambulatory Visit: Payer: Self-pay | Admitting: Surgery

## 2016-01-02 NOTE — H&P (Signed)
Sheryl Cox November 01/02/2016 1:36 PM Location: [facility] Patient #: [id number] DOB: 1971/02/19 Single / Language: Sheryl Cox / Race: Black or African American Female  History of Present Illness [name] A. [name] MD; 01/02/2016 2:04 PM) Patient words: Patient sent at the request of Dr. [name] for gallstones. Patient was seen last week in the emergency room for 1 day history of severe, sharp right upper quadrant pain radiating to her back. CT scan showed a large gallstone but her laboratory studies were normal and her pain was controlled and she was sent home. Her pain was caused by eating fatty greasy foods. There was nausea and vomiting. Patient denies fever or chills. She is asymptomatic today. CT scan showed a large gallstone without signs of ductal dilation.             CLINICAL DATA: Right-sided abdominal pain beginning last night.  EXAM: CT ABDOMEN AND PELVIS WITH CONTRAST  TECHNIQUE: Multidetector CT imaging of the abdomen and pelvis was performed using the standard protocol following bolus administration of intravenous contrast.  CONTRAST: 15mL ISOVUE-300 IOPAMIDOL (ISOVUE-300) INJECTION 61%  COMPARISON: 09/03/2014  FINDINGS: Lower chest: No acute abnormality.  Hepatobiliary: Unremarkable liver. 3.3 cm gallstone. No gallbladder wall thickening or inflammation. No bile duct dilation.  Pancreas: Unremarkable. No pancreatic ductal dilatation or surrounding inflammatory changes.  Spleen: Normal in size without focal abnormality.  Adrenals/Urinary Tract: Adrenal glands are unremarkable. Kidneys are normal, without renal calculi, focal lesion, or hydronephrosis. Bladder is unremarkable.  Stomach/Bowel: Stomach is within normal limits. Appendix appears normal. No evidence of bowel wall thickening, distention, or inflammatory changes.  Vascular/Lymphatic: No significant vascular findings are present. No enlarged abdominal or pelvic lymph  nodes.  Reproductive: Uterus is enlarged by multiple fibroids essentially stable from the prior CT. No ovarian/ adnexal masses.  Other: No abdominal wall hernia or abnormality. No abdominopelvic ascites.  Musculoskeletal: No acute or significant osseous findings.  IMPRESSION: 1. No acute findings. 2. Large gallstone with no CT evidence of acute cholecystitis. 3. Multiple uterine fibroids, stable.   Electronically Signed By: [name] M.D. On: 12/18/2015 10:39.  The patient is a 45 year old female.   Other Problems [name], CMA; 01/02/2016 1:36 PM) Back Pain Breast Cancer Diabetes Mellitus Gastroesophageal Reflux Disease Heart murmur High blood pressure Transfusion history  Past Surgical History [name], CMA; 01/02/2016 1:36 PM) Breast Biopsy Bilateral. Cesarean Section - 1 Mammoplasty; Reduction Bilateral.  Diagnostic Studies History [name], CMA; 01/02/2016 1:36 PM) Colonoscopy never Mammogram within last year Pap Smear 1-5 years ago  Allergies [name], CMA; 01/02/2016 1:37 PM) Shrimp Flavor *PHARMACEUTICAL ADJUVANTS* Anaphylaxis, Swelling.  Medication History [name], CMA; 01/02/2016 1:37 PM) Albuterol Sulfate HFA (108 (90 Base)MCG/ACT Aerosol Soln, Inhalation) Active. Hydrocodone-Acetaminophen (5-325MG  Tablet, Oral) Active. Multiple Vitamin (Oral) Active. Tribenzor (20-5-12.5MG  Tablet, Oral) Active. Ondansetron (4MG  Tablet Disint, Oral) Active. Tamoxifen Citrate (20MG  Tablet, Oral) Active. Medications Reconciled  Social History [name], [place]; 01/02/2016 1:36 PM) Alcohol use Occasional alcohol use. No caffeine use No drug use  Family History [name], [place]; 01/02/2016 1:36 PM) Arthritis Father. Diabetes Mellitus Mother. Heart Disease Mother. Heart disease in female family member before age 94 Hypertension Mother. Prostate Cancer Father.  Pregnancy / Birth History [name], CMA;  01/02/2016 1:36 PM) Age at menarche 71 years. Gravida 1 Maternal age 10-40 Para 1 Regular periods     Review of Systems [name] CMA; 01/02/2016 1:36 PM) General Present- Weight Gain. Not Present- Appetite Loss, Chills, Fatigue, Fever, Night Sweats and  Weight Loss. Skin Not Present- Change in Wart/Mole, Dryness, Hives, Jaundice, New Lesions, Non-Healing Wounds, Rash and Ulcer. Respiratory Present- Snoring. Not Present- Bloody sputum, Chronic Cough, Difficulty Breathing and Wheezing. Breast Not Present- Breast Mass, Breast Pain, Nipple Discharge and Skin Changes. Gastrointestinal Present- Constipation. Not Present- Abdominal Pain, Bloating, Bloody Stool, Change in Bowel Habits, Chronic diarrhea, Difficulty Swallowing, Excessive gas, Gets full quickly at meals, Hemorrhoids, Indigestion, Nausea, Rectal Pain and Vomiting. Female Genitourinary Not Present- Frequency, Nocturia, Painful Urination, Pelvic Pain and Urgency. Musculoskeletal Present- Back Pain. Not Present- Joint Pain, Joint Stiffness, Muscle Pain, Muscle Weakness and Swelling of Extremities. Neurological Not Present- Decreased Memory, Fainting, Headaches, Numbness, Seizures, Tingling, Tremor, Trouble walking and Weakness. Psychiatric Not Present- Anxiety, Bipolar, Change in Sleep Pattern, Depression, Fearful and Frequent crying. Endocrine Not Present- Cold Intolerance, Excessive Hunger, Hair Changes, Heat Intolerance, Hot flashes and New Diabetes. Hematology Not Present- Blood Thinners, Easy Bruising, Excessive bleeding, Gland problems, HIV and Persistent Infections.  Vitals [name] CMA; 01/02/2016 1:38 PM) 01/02/2016 1:37 PM Weight: 233.6 lb Height: 63in Body Surface Area: 2.07 m Body Mass Index: 41.38 kg/m  Temp.: 98.76F(Temporal)  Pulse: 91 (Regular)  BP: 146/80 (Sitting, Left Arm, Standard)      Physical Exam (Tyreona Panjwani A. Symphanie Cederberg MD; 01/02/2016 2:04 PM)  General Mental  Status-Alert. General Appearance-Consistent with stated age. Hydration-Well hydrated. Voice-Normal.  Head and Neck Head-normocephalic, atraumatic with no lesions or palpable masses.  Eye Eyeball - Bilateral-Extraocular movements intact. Sclera/Conjunctiva - Bilateral-No scleral icterus.  Chest and Lung Exam Chest and lung exam reveals -quiet, even and easy respiratory effort with no use of accessory muscles and on auscultation, normal breath sounds, no adventitious sounds and normal vocal resonance. Inspection Chest Wall - Normal. Back - normal.  Cardiovascular Cardiovascular examination reveals -on palpation PMI is normal in location and amplitude, no palpable S3 or S4. Normal cardiac borders., normal heart sounds, regular rate and rhythm with no murmurs, carotid auscultation reveals no bruits and normal pedal pulses bilaterally.  Abdomen Inspection Inspection of the abdomen reveals - No Hernias. Skin - Scar - no surgical scars. Palpation/Percussion Palpation and Percussion of the abdomen reveal - Soft, Non Tender, No Rebound tenderness, No Rigidity (guarding) and No hepatosplenomegaly. Auscultation Auscultation of the abdomen reveals - Bowel sounds normal.  Neurologic Neurologic evaluation reveals -alert and oriented x 3 with no impairment of recent or remote memory. Mental Status-Normal.  Musculoskeletal Normal Exam - Left-Upper Extremity Strength Normal and Lower Extremity Strength Normal. Normal Exam - Right-Upper Extremity Strength Normal, Lower Extremity Weakness.    Assessment & Plan (Khamora Karan A. Arturo Sofranko MD; 01/02/2016 2:04 PM)  SYMPTOMATIC CHOLELITHIASIS (K80.20) Impression: Discussed treatment options of laparoscopic cholecystectomy and cholangiogram versus medical management. Risks, benefits and alternatives were discussed with the patient. Long-term expectations were discussed with the patient. The procedure has been discussed with the  patient. Risks of laparoscopic cholecystectomy include bleeding, infection, bile duct injury, leak, death, open surgery, diarrhea, other surgery, organ injury, blood vessel injury, DVT, and additional care.  Current Plans You are being scheduled for surgery - Our schedulers will call you.  You should hear from our office's scheduling department within 5 working days about the location, date, and time of surgery. We try to make accommodations for patient's preferences in scheduling surgery, but sometimes the OR schedule or the surgeon's schedule prevents [place] from making those accommodations.  If you have not heard from our office [phone]) in 5 working days, call the office and ask for your surgeon's nurse.  If you have other questions about your diagnosis, plan, or surgery, call the office and ask for your surgeon's nurse.  Pt Education - Pamphlet Given - Laparoscopic Gallbladder Surgery: discussed with patient and provided information. The anatomy & physiology of hepatobiliary & pancreatic function was discussed. The pathophysiology of gallbladder dysfunction was discussed. Natural history risks without surgery was discussed. I feel the risks of no intervention will lead to serious problems that outweigh the operative risks; therefore, I recommended cholecystectomy to remove the pathology. I explained laparoscopic techniques with possible need for an open approach. Probable cholangiogram to evaluate the bilary tract was explained as well.  Risks such as bleeding, infection, abscess, leak, injury to other organs, need for further treatment, heart attack, death, and other risks were discussed. I noted a good likelihood this will help address the problem. Possibility that this will not correct all abdominal symptoms was explained. Goals of post-operative recovery were discussed as well. We will work to minimize complications. An educational handout further explaining the pathology and treatment  options was given as well. Questions were answered. The patient expresses understanding & wishes to proceed with surgery.  Pt Education - [facility] Laparosopic Post Op HCI (Gross) Pt Education - [facility] Good Bowel Health (Gross) Pt Education - Laparoscopic Cholecystectomy: gallbladder

## 2016-01-24 NOTE — Pre-Procedure Instructions (Signed)
Sheryl Cox  01/24/2016      [id number] [address], [address] [place] [address] Phone: [phone] [phone]    Your procedure is scheduled on Tues, Nov 14 @ 9:30 AM  Report to [facility] Admitting at 7:30 AM  Call this number if you have problems the morning of surgery:  [phone]   Remember:  Do not eat food or drink liquids after midnight.  Take these medicines the morning of surgery with A SIP OF WATER Albuterol<Bring Your Inhaler With You>, Ondansetron(Zofran-if needed),and Tamoxifen(Nolvadex)              No Goody's,BC's,Aleve,Advil,Motrin,Ibuprofen,Aspirin,Fish Oil,or any Herbal Medications.       How to Manage Your Diabetes Before and After Surgery  Why is it important to control my blood sugar before and after surgery? . Improving blood sugar levels before and after surgery helps healing and can limit problems. . A way of improving blood sugar control is eating a healthy diet by: o  Eating less sugar and carbohydrates o  Increasing activity/exercise o  Talking with your doctor about reaching your blood sugar goals . High blood sugars (greater than 180 mg/dL) can raise your risk of infections and slow your recovery, so you will need to focus on controlling your diabetes during the weeks before surgery. . Make sure that the doctor who takes care of your diabetes knows about your planned surgery including the date and location.  How do I manage my blood sugar before surgery? . Check your blood sugar at least 4 times a day, starting 2 days before surgery, to make sure that the level is not too high or low. o Check your blood sugar the morning of your surgery when you wake up and every 2 hours until you get to the Short Stay unit. . If your blood sugar is less than 70 mg/dL, you will need to treat for low blood sugar: o Do not take insulin. o Treat a low blood sugar (less than 70 mg/dL) with   cup of clear juice (cranberry or apple), 4 glucose tablets, OR glucose gel. o Recheck blood sugar in 15 minutes after treatment (to make sure it is greater than 70 mg/dL). If your blood sugar is not greater than 70 mg/dL on recheck, call [phone] for further instructions. . Report your blood sugar to the short stay nurse when you get to Short Stay.  . If you are admitted to the hospital after surgery: o Your blood sugar will be checked by the staff and you will probably be given insulin after surgery (instead of oral diabetes medicines) to make sure you have good blood sugar levels. o The goal for blood sugar control after surgery is 80-180 mg/dL.              WHAT DO I DO ABOUT MY DIABETES MEDICATION?   Marland Kitchen Do not take oral diabetes medicines (pills) the morning of surgery.       . The day of surgery, do not take other diabetes injectables, including Byetta (exenatide), Bydureon (exenatide ER), Victoza (liraglutide), or Trulicity (dulaglutide).  . If your CBG is greater than 220 mg/dL, you may take  of your sliding scale (correction) dose of insulin.  Other Instructions:          Patient Signature:  Date:   Nurse Signature:  Date:   Reviewed and Endorsed by [facility] Patient Education Committee, August 2015  Do not wear jewelry, make-up or nail polish.  Do not wear lotions, powders, perfumes, or deoderant.  Do not shave 48 hours prior to surgery.    Do not bring valuables to the hospital.  [facility] is not responsible for any belongings or valuables.  Contacts, dentures or bridgework may not be worn into surgery.  Leave your suitcase in the car.  After surgery it may be brought to your room.  For patients admitted to the hospital, discharge time will be determined by your treatment team.  Patients discharged the day of surgery will not be allowed to drive home.   [facility]  Before surgery, you can play an  important role.  Because skin is not sterile, your skin needs to be as free of germs as possible.  You can reduce the number of germs on you skin by washing with CHG (chlorahexidine gluconate) soap before surgery.  CHG is an antiseptic cleaner which kills germs and bonds with the skin to continue killing germs even after washing.  Please DO NOT use if you have an allergy to CHG or antibacterial soaps.  If your skin becomes reddened/irritated stop using the CHG and inform your nurse when you arrive at Short Stay.  Do not shave (including legs and underarms) for at least 48 hours prior to the first CHG shower.  You may shave your face.  Please follow these instructions carefully:   1.  Shower with CHG Soap the night before surgery and the                                morning of Surgery.  2.  If you choose to wash your hair, wash your hair first as usual with your       normal shampoo.  3.  After you shampoo, rinse your hair and body thoroughly to remove the                      Shampoo.  4.  Use CHG as you would any other liquid soap.  You can apply chg directly       to the skin and wash gently with scrungie or a clean washcloth.  5.  Apply the CHG Soap to your body ONLY FROM THE NECK DOWN.        Do not use on open wounds or open sores.  Avoid contact with your eyes,       ears, mouth and genitals (private parts).  Wash genitals (private parts)       with your normal soap.  6.  Wash thoroughly, paying special attention to the area where your surgery        will be performed.  7.  Thoroughly rinse your body with warm water from the neck down.  8.  DO NOT shower/wash with your normal soap after using and rinsing off       the CHG Soap.  9.  Pat yourself dry with a clean towel.            10.  Wear clean pajamas.            11.  Place clean sheets on your bed the night of your first shower and do not        sleep with pets.  Day of Surgery  Do not apply any lotions/deoderants the morning of  surgery.  Please wear clean clothes to the hospital/surgery center.     Please read over the following fact sheets that you were given. Pain Booklet, Coughing and Deep Breathing and Surgical Site Infection Prevention

## 2016-01-25 ENCOUNTER — Encounter (HOSPITAL_COMMUNITY)
Admission: RE | Admit: 2016-01-25 | Discharge: 2016-01-25 | Disposition: A | Discharge status: Home / Self Care | Payer: Medicaid Other | Source: Ambulatory Visit | Attending: Surgery | Admitting: Surgery

## 2016-01-25 ENCOUNTER — Encounter (HOSPITAL_COMMUNITY): Payer: Self-pay

## 2016-01-25 DIAGNOSIS — I517 Cardiomegaly: Secondary | ICD-10-CM | POA: Insufficient documentation

## 2016-01-25 DIAGNOSIS — Z01810 Encounter for preprocedural cardiovascular examination: Secondary | ICD-10-CM | POA: Insufficient documentation

## 2016-01-25 DIAGNOSIS — Z01812 Encounter for preprocedural laboratory examination: Secondary | ICD-10-CM | POA: Insufficient documentation

## 2016-01-25 HISTORY — DX: Type 2 diabetes mellitus without complications: E11.9

## 2016-01-25 HISTORY — DX: Personal history of other diseases of the nervous system and sense organs: Z86.69

## 2016-01-25 LAB — CBC
HEMATOCRIT: 33.5 % — AB (ref 36.0–46.0)
HEMOGLOBIN: 10.5 g/dL — AB (ref 12.0–15.0)
MCH: 22.6 pg — ABNORMAL LOW (ref 26.0–34.0)
MCHC: 31.3 g/dL (ref 30.0–36.0)
MCV: 72.2 fL — ABNORMAL LOW (ref 78.0–100.0)
Platelets: 309 10*3/uL (ref 150–400)
RBC: 4.64 MIL/uL (ref 3.87–5.11)
RDW: 16.7 % — AB (ref 11.5–15.5)
WBC: 5.3 10*3/uL (ref 4.0–10.5)

## 2016-01-25 LAB — BASIC METABOLIC PANEL
ANION GAP: 7 (ref 5–15)
BUN: 14 mg/dL (ref 6–20)
CALCIUM: 9.7 mg/dL (ref 8.9–10.3)
CO2: 27 mmol/L (ref 22–32)
Chloride: 105 mmol/L (ref 101–111)
Creatinine, Ser: 0.74 mg/dL (ref 0.44–1.00)
GFR calc Af Amer: >60 mL/min (ref >60)
Glucose, Bld: 104 mg/dL — ABNORMAL HIGH (ref 65–99)
POTASSIUM: 3.5 mmol/L (ref 3.5–5.1)
SODIUM: 139 mmol/L (ref 135–145)

## 2016-01-25 LAB — HCG, SERUM, QUALITATIVE: PREG SERUM: NEGATIVE

## 2016-01-25 LAB — GLUCOSE, CAPILLARY: GLUCOSE-CAPILLARY: 111 mg/dL — AB (ref 65–99)

## 2016-01-25 MED ORDER — CHLORHEXIDINE GLUCONATE CLOTH 2 % EX PADS: 6.0000 | MEDICATED_PAD | Freq: Once | CUTANEOUS | Status: DC

## 2016-01-25 NOTE — Progress Notes (Addendum)
Cardiologist denies  Medical MD is Dr.Veita [name]  Echo denies  Stress test denies  Heart cath denies  EKG in epic from 2017 but HR was 135. To repeat today  CXR in epic from 04-06-15

## 2016-01-26 LAB — HEMOGLOBIN A1C
HEMOGLOBIN A1C: 5.8 % — AB (ref 4.8–5.6)
MEAN PLASMA GLUCOSE: 120 mg/dL

## 2016-01-30 MED ORDER — DEXTROSE 5 % IV SOLN
3.0000 g | INTRAVENOUS | Status: AC
  Administered 2016-01-31: 3 g via INTRAVENOUS
  Filled 2016-01-30: qty 3000

## 2016-01-31 ENCOUNTER — Ambulatory Visit (HOSPITAL_COMMUNITY): Payer: Medicaid Other | Admitting: Anesthesiology

## 2016-01-31 ENCOUNTER — Encounter (HOSPITAL_COMMUNITY): Payer: Self-pay | Admitting: *Deleted

## 2016-01-31 ENCOUNTER — Encounter (HOSPITAL_COMMUNITY)
Admission: RE | Disposition: A | Discharge status: Home / Self Care | Payer: Self-pay | Source: Ambulatory Visit | Attending: Surgery

## 2016-01-31 ENCOUNTER — Ambulatory Visit (HOSPITAL_COMMUNITY)
Admission: RE | Admit: 2016-01-31 | Discharge: 2016-01-31 | Disposition: A | Discharge status: Home / Self Care | Payer: Medicaid Other | Source: Ambulatory Visit | Attending: Surgery | Admitting: Surgery

## 2016-01-31 DIAGNOSIS — E119 Type 2 diabetes mellitus without complications: Secondary | ICD-10-CM | POA: Insufficient documentation

## 2016-01-31 DIAGNOSIS — Z8249 Family history of ischemic heart disease and other diseases of the circulatory system: Secondary | ICD-10-CM | POA: Diagnosis not present

## 2016-01-31 DIAGNOSIS — K8010 Calculus of gallbladder with chronic cholecystitis without obstruction: Secondary | ICD-10-CM | POA: Diagnosis not present

## 2016-01-31 DIAGNOSIS — R011 Cardiac murmur, unspecified: Secondary | ICD-10-CM | POA: Insufficient documentation

## 2016-01-31 DIAGNOSIS — K219 Gastro-esophageal reflux disease without esophagitis: Secondary | ICD-10-CM | POA: Insufficient documentation

## 2016-01-31 DIAGNOSIS — Z7984 Long term (current) use of oral hypoglycemic drugs: Secondary | ICD-10-CM | POA: Insufficient documentation

## 2016-01-31 DIAGNOSIS — D649 Anemia, unspecified: Secondary | ICD-10-CM | POA: Insufficient documentation

## 2016-01-31 DIAGNOSIS — J45909 Unspecified asthma, uncomplicated: Secondary | ICD-10-CM | POA: Diagnosis not present

## 2016-01-31 DIAGNOSIS — Z6841 Body Mass Index (BMI) 40.0 and over, adult: Secondary | ICD-10-CM | POA: Diagnosis not present

## 2016-01-31 DIAGNOSIS — I10 Essential (primary) hypertension: Secondary | ICD-10-CM | POA: Insufficient documentation

## 2016-01-31 DIAGNOSIS — Z8261 Family history of arthritis: Secondary | ICD-10-CM | POA: Diagnosis not present

## 2016-01-31 DIAGNOSIS — K8020 Calculus of gallbladder without cholecystitis without obstruction: Secondary | ICD-10-CM | POA: Diagnosis present

## 2016-01-31 DIAGNOSIS — D259 Leiomyoma of uterus, unspecified: Secondary | ICD-10-CM | POA: Insufficient documentation

## 2016-01-31 DIAGNOSIS — Z91013 Allergy to seafood: Secondary | ICD-10-CM | POA: Diagnosis not present

## 2016-01-31 DIAGNOSIS — Z833 Family history of diabetes mellitus: Secondary | ICD-10-CM | POA: Insufficient documentation

## 2016-01-31 DIAGNOSIS — E669 Obesity, unspecified: Secondary | ICD-10-CM | POA: Diagnosis not present

## 2016-01-31 HISTORY — PX: CHOLECYSTECTOMY: SHX55

## 2016-01-31 LAB — GLUCOSE, CAPILLARY
GLUCOSE-CAPILLARY: 125 mg/dL — AB (ref 65–99)
Glucose-Capillary: 110 mg/dL — ABNORMAL HIGH (ref 65–99)

## 2016-01-31 SURGERY — LAPAROSCOPIC CHOLECYSTECTOMY WITH INTRAOPERATIVE CHOLANGIOGRAM
Anesthesia: General | Site: Abdomen

## 2016-01-31 MED ORDER — PROMETHAZINE HCL 25 MG/ML IJ SOLN: 6.2500 mg | INTRAMUSCULAR | Status: DC | PRN

## 2016-01-31 MED ORDER — SUGAMMADEX SODIUM 200 MG/2ML IV SOLN
INTRAVENOUS | Status: DC | PRN
  Administered 2016-01-31: 300 mg via INTRAVENOUS

## 2016-01-31 MED ORDER — 0.9 % SODIUM CHLORIDE (POUR BTL) OPTIME
TOPICAL | Status: DC | PRN
  Administered 2016-01-31: 1000 mL

## 2016-01-31 MED ORDER — HEMOSTATIC AGENTS (NO CHARGE) OPTIME
TOPICAL | Status: DC | PRN
  Administered 2016-01-31 (×2): 1 via TOPICAL

## 2016-01-31 MED ORDER — BUPIVACAINE HCL (PF) 0.25 % IJ SOLN
INTRAMUSCULAR | Status: AC
  Filled 2016-01-31: qty 30

## 2016-01-31 MED ORDER — LACTATED RINGERS IV SOLN
INTRAVENOUS | Status: DC | PRN
  Administered 2016-01-31 (×2): via INTRAVENOUS

## 2016-01-31 MED ORDER — BUPIVACAINE-EPINEPHRINE 0.25% -1:200000 IJ SOLN
INTRAMUSCULAR | Status: DC | PRN
  Administered 2016-01-31: 5 mL

## 2016-01-31 MED ORDER — ONDANSETRON HCL 4 MG/2ML IJ SOLN
INTRAMUSCULAR | Status: DC | PRN
  Administered 2016-01-31: 4 mg via INTRAVENOUS

## 2016-01-31 MED ORDER — HYDROMORPHONE HCL 1 MG/ML IJ SOLN
INTRAMUSCULAR | Status: AC
  Filled 2016-01-31: qty 1

## 2016-01-31 MED ORDER — MEPERIDINE HCL 25 MG/ML IJ SOLN: 6.2500 mg | INTRAMUSCULAR | Status: DC | PRN

## 2016-01-31 MED ORDER — LIDOCAINE HCL (CARDIAC) 20 MG/ML IV SOLN
INTRAVENOUS | Status: DC | PRN
  Administered 2016-01-31: 20 mg via INTRAVENOUS

## 2016-01-31 MED ORDER — LACTATED RINGERS IV SOLN
Freq: Once | INTRAVENOUS | Status: AC
  Administered 2016-01-31: 08:00:00 via INTRAVENOUS

## 2016-01-31 MED ORDER — DEXAMETHASONE SODIUM PHOSPHATE 10 MG/ML IJ SOLN
INTRAMUSCULAR | Status: DC | PRN
  Administered 2016-01-31: 10 mg via INTRAVENOUS

## 2016-01-31 MED ORDER — HYDROMORPHONE HCL 1 MG/ML IJ SOLN
0.2500 mg | INTRAMUSCULAR | Status: DC | PRN
  Administered 2016-01-31 (×4): 0.5 mg via INTRAVENOUS

## 2016-01-31 MED ORDER — OXYCODONE-ACETAMINOPHEN 5-325 MG PO TABS: 1.0000 | ORAL_TABLET | ORAL | 0 refills | Status: DC | PRN

## 2016-01-31 MED ORDER — MIDAZOLAM HCL 2 MG/2ML IJ SOLN
INTRAMUSCULAR | Status: AC
  Filled 2016-01-31: qty 2

## 2016-01-31 MED ORDER — SODIUM CHLORIDE 0.9 % IR SOLN
Status: DC | PRN
  Administered 2016-01-31: 1000 mL

## 2016-01-31 MED ORDER — ROCURONIUM BROMIDE 100 MG/10ML IV SOLN
INTRAVENOUS | Status: DC | PRN
  Administered 2016-01-31: 50 mg via INTRAVENOUS

## 2016-01-31 MED ORDER — PROPOFOL 10 MG/ML IV BOLUS
INTRAVENOUS | Status: DC | PRN
  Administered 2016-01-31: 200 mg via INTRAVENOUS

## 2016-01-31 MED ORDER — MIDAZOLAM HCL 2 MG/2ML IJ SOLN: 0.5000 mg | Freq: Once | INTRAMUSCULAR | Status: DC | PRN

## 2016-01-31 MED ORDER — FENTANYL CITRATE (PF) 100 MCG/2ML IJ SOLN
INTRAMUSCULAR | Status: DC | PRN
  Administered 2016-01-31: 100 ug via INTRAVENOUS
  Administered 2016-01-31 (×4): 50 ug via INTRAVENOUS

## 2016-01-31 MED ORDER — IOPAMIDOL (ISOVUE-300) INJECTION 61%
INTRAVENOUS | Status: AC
  Filled 2016-01-31: qty 50

## 2016-01-31 MED ORDER — PROPOFOL 10 MG/ML IV BOLUS
INTRAVENOUS | Status: AC
  Filled 2016-01-31: qty 20

## 2016-01-31 MED ORDER — FENTANYL CITRATE (PF) 100 MCG/2ML IJ SOLN
INTRAMUSCULAR | Status: AC
  Filled 2016-01-31: qty 2

## 2016-01-31 MED ORDER — MIDAZOLAM HCL 5 MG/5ML IJ SOLN
INTRAMUSCULAR | Status: DC | PRN
  Administered 2016-01-31: 2 mg via INTRAVENOUS

## 2016-01-31 MED ORDER — HYDROMORPHONE HCL 2 MG/ML IJ SOLN
INTRAMUSCULAR | Status: AC
  Filled 2016-01-31: qty 1

## 2016-01-31 SURGICAL SUPPLY — 46 items
ADH SKN CLS APL DERMABOND .7 (GAUZE/BANDAGES/DRESSINGS) ×1
APPLIER CLIP ROT 10 11.4 M/L (STAPLE) ×3
APR CLP MED LRG 11.4X10 (STAPLE) ×1
BAG SPEC RTRVL LRG 6X4 10 (ENDOMECHANICALS) ×1
BLADE SURG ROTATE 9660 (MISCELLANEOUS) ×2 IMPLANT
CANISTER SUCTION 2500CC (MISCELLANEOUS) ×3 IMPLANT
CHLORAPREP W/TINT 26ML (MISCELLANEOUS) ×3 IMPLANT
CLIP APPLIE ROT 10 11.4 M/L (STAPLE) ×1 IMPLANT
COVER MAYO STAND STRL (DRAPES) ×3 IMPLANT
COVER SURGICAL LIGHT HANDLE (MISCELLANEOUS) ×3 IMPLANT
DERMABOND ADVANCED (GAUZE/BANDAGES/DRESSINGS) ×2
DERMABOND ADVANCED .7 DNX12 (GAUZE/BANDAGES/DRESSINGS) ×1 IMPLANT
DRAPE C-ARM 42X72 X-RAY (DRAPES) ×3 IMPLANT
DRAPE WARM FLUID 44X44 (DRAPE) ×1 IMPLANT
ELECT REM PT RETURN 9FT ADLT (ELECTROSURGICAL) ×3
ELECTRODE REM PT RTRN 9FT ADLT (ELECTROSURGICAL) ×1 IMPLANT
GLOVE BIO SURGEON STRL SZ7.5 (GLOVE) ×2 IMPLANT
GLOVE BIO SURGEON STRL SZ8 (GLOVE) ×3 IMPLANT
GLOVE BIOGEL PI IND STRL 6.5 (GLOVE) IMPLANT
GLOVE BIOGEL PI IND STRL 8 (GLOVE) ×1 IMPLANT
GLOVE BIOGEL PI INDICATOR 6.5 (GLOVE) ×4
GLOVE BIOGEL PI INDICATOR 8 (GLOVE) ×2
GLOVE ECLIPSE 6.0 STRL STRAW (GLOVE) ×2 IMPLANT
GLOVE INDICATOR 7.5 STRL GRN (GLOVE) ×2 IMPLANT
GLOVE SURG SS PI 6.0 STRL IVOR (GLOVE) ×2 IMPLANT
GOWN STRL REUS W/ TWL LRG LVL3 (GOWN DISPOSABLE) ×2 IMPLANT
GOWN STRL REUS W/ TWL XL LVL3 (GOWN DISPOSABLE) ×1 IMPLANT
GOWN STRL REUS W/TWL LRG LVL3 (GOWN DISPOSABLE) ×9
GOWN STRL REUS W/TWL XL LVL3 (GOWN DISPOSABLE) ×3
HEMOSTAT SNOW SURGICEL 2X4 (HEMOSTASIS) ×4 IMPLANT
KIT BASIN OR (CUSTOM PROCEDURE TRAY) ×3 IMPLANT
KIT ROOM TURNOVER OR (KITS) ×3 IMPLANT
NS IRRIG 1000ML POUR BTL (IV SOLUTION) ×3 IMPLANT
PAD ARMBOARD 7.5X6 YLW CONV (MISCELLANEOUS) ×3 IMPLANT
POUCH SPECIMEN RETRIEVAL 10MM (ENDOMECHANICALS) ×3 IMPLANT
SCISSORS LAP 5X35 DISP (ENDOMECHANICALS) ×3 IMPLANT
SET IRRIG TUBING LAPAROSCOPIC (IRRIGATION / IRRIGATOR) ×3 IMPLANT
SLEEVE ENDOPATH XCEL 5M (ENDOMECHANICALS) ×3 IMPLANT
SPECIMEN JAR SMALL (MISCELLANEOUS) ×3 IMPLANT
SUT MNCRL AB 4-0 PS2 18 (SUTURE) ×3 IMPLANT
TOWEL OR 17X24 6PK STRL BLUE (TOWEL DISPOSABLE) ×3 IMPLANT
TRAY LAPAROSCOPIC MC (CUSTOM PROCEDURE TRAY) ×3 IMPLANT
TROCAR XCEL BLUNT TIP 100MML (ENDOMECHANICALS) ×3 IMPLANT
TROCAR XCEL NON-BLD 11X100MML (ENDOMECHANICALS) ×3 IMPLANT
TROCAR XCEL NON-BLD 5MMX100MML (ENDOMECHANICALS) ×3 IMPLANT
TUBING INSUFFLATION (TUBING) ×3 IMPLANT

## 2016-01-31 NOTE — Transfer of Care (Signed)
Immediate Anesthesia Transfer of Care Note  Patient: Sheryl Cox  Procedure(s) Performed: Procedure(s): LAPAROSCOPIC CHOLECYSTECTOMY (N/A)  Patient Location: PACU  Anesthesia Type:General  Level of Consciousness: awake, alert  and oriented  Airway & Oxygen Therapy: Patient Spontanous Breathing and Patient connected to nasal cannula oxygen  Post-op Assessment: Report given to RN, Post -op Vital signs reviewed and stable and Patient moving all extremities X 4  Post vital signs: Reviewed and stable  Last Vitals:  Vitals:   01/31/16 0926 01/31/16 1041  BP: (!) 170/106 (!) 142/95  Pulse: 92 (!) 110  Resp: 20 20  Temp: 37.3 C 36.4 C    Last Pain:  Vitals:   01/31/16 1041  TempSrc:   PainSc: (P) 7          Complications: No apparent anesthesia complications

## 2016-01-31 NOTE — Interval H&P Note (Signed)
History and Physical Interval Note:  01/31/2016 8:46 AM  Sheryl Cox  has presented today for surgery, with the diagnosis of GALLSTONES  The various methods of treatment have been discussed with the patient and family. After consideration of risks, benefits and other options for treatment, the patient has consented to  Procedure(s): LAPAROSCOPIC CHOLECYSTECTOMY WITH INTRAOPERATIVE CHOLANGIOGRAM (N/A) as a surgical intervention .  The patient's history has been reviewed, patient examined, no change in status, stable for surgery.  I have reviewed the patient's chart and labs.  Questions were answered to the patient's satisfaction.     Sheryl Cox A.

## 2016-01-31 NOTE — Anesthesia Preprocedure Evaluation (Addendum)
Anesthesia Evaluation  Patient identified by MRN, date of birth, ID band Patient awake    Reviewed: Allergy & Precautions, NPO status , Patient's Chart, lab work & pertinent test results  Airway Mallampati: I  TM Distance: >3 FB Neck ROM: Full    Dental  (+) Teeth Intact, Dental Advidsory Given   Pulmonary asthma (last inhaler over a month ago) ,    breath sounds clear to auscultation       Cardiovascular hypertension, Pt. on medications (-) angina Rhythm:Regular Rate:Normal     Neuro/Psych negative neurological ROS     GI/Hepatic Neg liver ROS, Symptomatic chole   Endo/Other  diabetes (glu 111), Oral Hypoglycemic Agents  Renal/GU negative Renal ROS     Musculoskeletal  (+) Arthritis ,   Abdominal (+) + obese,   Peds  Hematology  (+) Blood dyscrasia (Hb 10.5), anemia ,   Anesthesia Other Findings   Reproductive/Obstetrics                           Anesthesia Physical Anesthesia Plan  ASA: II  Anesthesia Plan: General   Post-op Pain Management:    Induction: Intravenous  Airway Management Planned: Oral ETT  Additional Equipment:   Intra-op Plan:   Post-operative Plan: Extubation in OR  Informed Consent: I have reviewed the patients History and Physical, chart, labs and discussed the procedure including the risks, benefits and alternatives for the proposed anesthesia with the patient or authorized representative who has indicated his/her understanding and acceptance.   Dental Advisory Given  Plan Discussed with: Anesthesiologist, CRNA and Surgeon  Anesthesia Plan Comments: (Plan routine monitors, GETA)       Anesthesia Quick Evaluation

## 2016-01-31 NOTE — Anesthesia Procedure Notes (Signed)
Procedure Name: Intubation Date/Time: 01/31/2016 9:28 AM Performed by: Neldon Newport Pre-anesthesia Checklist: Timeout performed, Patient being monitored, Suction available, Emergency Drugs available and Patient identified Patient Re-evaluated:Patient Re-evaluated prior to inductionOxygen Delivery Method: Circle system utilized Preoxygenation: Pre-oxygenation with 100% oxygen Intubation Type: IV induction Ventilation: Mask ventilation without difficulty Laryngoscope Size: Mac and 3 Grade View: Grade I Tube type: Oral Tube size: 7.0 mm Number of attempts: 1 Placement Confirmation: breath sounds checked- equal and bilateral,  positive ETCO2 and ETT inserted through vocal cords under direct vision Secured at: 21 cm Tube secured with: Tape Dental Injury: Teeth and Oropharynx as per pre-operative assessment

## 2016-01-31 NOTE — Op Note (Signed)
Laparoscopic Cholecystectomy  Procedure Note  Indications: This patient presents with symptomatic gallbladder disease and will undergo laparoscopic cholecystectomy. The procedure has been discussed with the patient. Operative and non operative treatments have been discussed. Risks of surgery include bleeding, infection,  Common bile duct injury,  Injury to the stomach,liver, colon,small intestine, abdominal wall,  Diaphragm,  Major blood vessels,  And the need for an open procedure.  Other risks include worsening of medical problems, death,  DVT and pulmonary embolism, and cardiovascular events.   Medical options have also been discussed. The patient has been informed of long term expectations of surgery and non surgical options,  The patient agrees to proceed.    Pre-operative Diagnosis: Calculus of gallbladder without mention of cholecystitis or obstruction  Post-operative Diagnosis: Same  Surgeon: Andilyn Bettcher A.   Assistants: Deon Pilling RNFA  Anesthesia: General endotracheal anesthesia and Local anesthesia 0.25.% bupivacaine  ASA Class: 2  Procedure Details  The patient was seen again in the Holding Room. The risks, benefits, complications, treatment options, and expected outcomes were discussed with the patient. The possibilities of reaction to medication, pulmonary aspiration, perforation of viscus, bleeding, recurrent infection, finding a normal gallbladder, the need for additional procedures, failure to diagnose a condition, the possible need to convert to an open procedure, and creating a complication requiring transfusion or operation were discussed with the patient. The patient and/or family concurred with the proposed plan, giving informed consent. The site of surgery properly noted/marked. The patient was taken to Operating Room, identified as Sheryl Cox and the procedure verified as Laparoscopic Cholecystectomy with Intraoperative Cholangiograms. A Time Out was held and the above  information confirmed.  Prior to the induction of general anesthesia, antibiotic prophylaxis was administered. General endotracheal anesthesia was then administered and tolerated well. After the induction, the abdomen was prepped in the usual sterile fashion. The patient was positioned in the supine position with the left arm comfortably tucked, along with some reverse Trendelenburg.  Local anesthetic agent was injected into the skin near the umbilicus and an incision made. The midline fascia was incised and the Hasson technique was used to introduce a 12 mm port under direct vision. It was secured with a figure of eight Vicryl suture placed in the usual fashion. Pneumoperitoneum was then created with CO2 and tolerated well without any adverse changes in the patient's vital signs. Additional trocars were introduced under direct vision with an 11 mm trocar in the epigastrium and 2 5 mm trocars in the right upper quadrant. All skin incisions were infiltrated with a local anesthetic agent before making the incision and placing the trocars.   The gallbladder was identified, the fundus grasped and retracted cephalad. Adhesions were lysed bluntly and with the electrocautery where indicated, taking care not to injure any adjacent organs or viscus. The infundibulum was grasped and retracted laterally, exposing the peritoneum overlying the triangle of Calot. This was then divided and exposed in a blunt fashion. The cystic duct was clearly identified and bluntly dissected circumferentially. The junctions of the gallbladder, cystic duct and common bile duct were clearly identified prior to the division of any linear structure.   The patient had an iodine allergy therefore I decided against a cholangiogram given that the anatomy was clear and her liver function studies were normal and no significant dilatation of CBD on inspection.  U/S did not show CBD dilation.    The cystic duct was then  ligated with surgical  clips  on the patient side and  clipped on the gallbladder side and divided. The cystic artery was identified, dissected free, ligated with clips and divided as well. Posterior cystic artery clipped and divided.  The gallbladder was dissected from the liver bed in retrograde fashion with the electrocautery. The gallbladder was removed with Endocatch bag.   The liver bed was irrigated and inspected. Hemostasis was achieved with the electrocautery. Copious irrigation was utilized and was repeatedly aspirated until clear all particulate matter. Hemostasis was achieved with Surgicel and cautery  no signs  Of bleeding or bile leakage.  Pneumoperitoneum was completely reduced after viewing removal of the trocars under direct vision. The wound was thoroughly irrigated and the fascia was then closed with a figure of eight suture; the skin was then closed with 4 O monocryl  and a sterile dressing of liquid adhesive applied.  was applied.  Instrument, sponge, and needle counts were correct at closure and at the conclusion of the case.   Findings: Cholelithiasis  Estimated Blood Loss: less than 50 mL         Drains: NONE          Total IV Fluids: 800 mL         Specimens: Gallbladder           Complications: None; patient tolerated the procedure well.         Disposition: PACU - hemodynamically stable.         Condition: stable

## 2016-01-31 NOTE — Anesthesia Postprocedure Evaluation (Signed)
Anesthesia Post Note  Patient: Sheryl Cox  Procedure(s) Performed: Procedure(s) (LRB): LAPAROSCOPIC CHOLECYSTECTOMY (N/A)  Patient location during evaluation: PACU Anesthesia Type: General Level of consciousness: awake and alert, oriented and patient cooperative Pain management: pain level controlled Vital Signs Assessment: post-procedure vital signs reviewed and stable Respiratory status: spontaneous breathing, nonlabored ventilation and respiratory function stable Cardiovascular status: blood pressure returned to baseline and stable Postop Assessment: no signs of nausea or vomiting Anesthetic complications: no    Last Vitals:  Vitals:   01/31/16 1140 01/31/16 1200  BP: 128/73 (!) 156/87  Pulse: 96 98  Resp: 20 (!) 26  Temp:      Last Pain:  Vitals:   01/31/16 1145  TempSrc:   PainSc: 3                  Bliss Tsang,E. Oluwatobi Ruppe

## 2016-01-31 NOTE — Discharge Instructions (Signed)
[facility] ______[facility] °LAPAROSCOPIC SURGERY: POST OP INSTRUCTIONS °Always review your discharge instruction sheet given to you by the facility where your surgery was performed. °IF YOU HAVE DISABILITY OR FAMILY LEAVE FORMS, YOU MUST BRING THEM TO THE OFFICE FOR PROCESSING.   °DO NOT GIVE THEM TO YOUR DOCTOR. ° °1. A prescription for pain medication may be given to you upon discharge.  Take your pain medication as prescribed, if needed.  If narcotic pain medicine is not needed, then you may take acetaminophen (Tylenol) or ibuprofen (Advil) as needed. °2. Take your usually prescribed medications unless otherwise directed. °3. If you need a refill on your pain medication, please contact your pharmacy.  They will contact our office to request authorization. Prescriptions will not be filled after 5pm or on week-ends. °4. You should follow a light diet the first few days after arrival home, such as soup and crackers, etc.  Be sure to include lots of fluids daily. °5. Most patients will experience some swelling and bruising in the area of the incisions.  Ice packs will help.  Swelling and bruising can take several days to resolve.  °6. It is common to experience some constipation if taking pain medication after surgery.  Increasing fluid intake and taking a stool softener (such as Colace) will usually help or prevent this problem from occurring.  A mild laxative (Milk of Magnesia or Miralax) should be taken according to package instructions if there are no bowel movements after 48 hours. °7. Unless discharge instructions indicate otherwise, you may remove your bandages 24-48 hours after surgery, and you may shower at that time.  You may have steri-strips (small skin tapes) in place directly over the incision.  These strips should be left on the skin for 7-10 days.  If your surgeon used skin glue on the incision, you may shower in 24 hours.  The glue will flake off over the next 2-3 weeks.  Any sutures or  staples will be removed at the office during your follow-up visit. °8. ACTIVITIES:  You may resume regular (light) daily activities beginning the next day--such as daily self-care, walking, climbing stairs--gradually increasing activities as tolerated.  You may have sexual intercourse when it is comfortable.  Refrain from any heavy lifting or straining until approved by your doctor. °a. You may drive when you are no longer taking prescription pain medication, you can comfortably wear a seatbelt, and you can safely maneuver your car and apply brakes. °b. RETURN TO WORK:  __________________________________________________________ °9. You should see your doctor in the office for a follow-up appointment approximately 2-3 weeks after your surgery.  Make sure that you call for this appointment within a day or two after you arrive home to insure a convenient appointment time. °10. OTHER INSTRUCTIONS: __________________________________________________________________________________________________________________________ __________________________________________________________________________________________________________________________ °WHEN TO CALL YOUR DOCTOR: °1. Fever over 101.0 °2. Inability to urinate °3. Continued bleeding from incision. °4. Increased pain, redness, or drainage from the incision. °5. Increasing abdominal pain ° °The clinic staff is available to answer your questions during regular business hours.  Please don’t hesitate to call and ask to speak to one of the nurses for clinical concerns.  If you have a medical emergency, go to the nearest emergency room or call 911.  A surgeon from [facility] is always on call at the hospital. °[address], [address], [place], [place]  [address] ? [address] [address], [place], [place]   [address] °[phone] ? [phone] ? FAX [phone] °Web site:   [email or web address] °

## 2016-01-31 NOTE — H&P (View-Only) (Signed)
Sheryl Cox November 01/02/2016 1:36 PM Location: [facility] Patient #: [id number] DOB: 1970-07-06 Single / Language: Sheryl Cox / Race: Black or African American Female  History of Present Illness Sheryl Moores A. Sheryl Kalina MD; 01/02/2016 2:04 PM) Patient words: Patient sent at the request of Dr. [name] for gallstones. Patient was seen last week in the emergency room for 1 day history of severe, sharp right upper quadrant pain radiating to her back. CT scan showed a large gallstone but her laboratory studies were normal and her pain was controlled and she was sent home. Her pain was caused by eating fatty greasy foods. There was nausea and vomiting. Patient denies fever or chills. She is asymptomatic today. CT scan showed a large gallstone without signs of ductal dilation.             CLINICAL DATA: Right-sided abdominal pain beginning last night.  EXAM: CT ABDOMEN AND PELVIS WITH CONTRAST  TECHNIQUE: Multidetector CT imaging of the abdomen and pelvis was performed using the standard protocol following bolus administration of intravenous contrast.  CONTRAST: 177mL ISOVUE-300 IOPAMIDOL (ISOVUE-300) INJECTION 61%  COMPARISON: 09/03/2014  FINDINGS: Lower chest: No acute abnormality.  Hepatobiliary: Unremarkable liver. 3.3 cm gallstone. No gallbladder wall thickening or inflammation. No bile duct dilation.  Pancreas: Unremarkable. No pancreatic ductal dilatation or surrounding inflammatory changes.  Spleen: Normal in size without focal abnormality.  Adrenals/Urinary Tract: Adrenal glands are unremarkable. Kidneys are normal, without renal calculi, focal lesion, or hydronephrosis. Bladder is unremarkable.  Stomach/Bowel: Stomach is within normal limits. Appendix appears normal. No evidence of bowel wall thickening, distention, or inflammatory changes.  Vascular/Lymphatic: No significant vascular findings are present. No enlarged abdominal or pelvic lymph  nodes.  Reproductive: Uterus is enlarged by multiple fibroids essentially stable from the prior CT. No ovarian/ adnexal masses.  Other: No abdominal wall hernia or abnormality. No abdominopelvic ascites.  Musculoskeletal: No acute or significant osseous findings.  IMPRESSION: 1. No acute findings. 2. Large gallstone with no CT evidence of acute cholecystitis. 3. Multiple uterine fibroids, stable.   Electronically Signed By: [name] M.D. On: 12/18/2015 10:39.  The patient is a 45 year old female.   Other Problems [name], CMA; 01/02/2016 1:36 PM) Back Pain Breast Cancer Diabetes Mellitus Gastroesophageal Reflux Disease Heart murmur High blood pressure Transfusion history  Past Surgical History [name], CMA; 01/02/2016 1:36 PM) Breast Biopsy Bilateral. Cesarean Section - 1 Mammoplasty; Reduction Bilateral.  Diagnostic Studies History [name], CMA; 01/02/2016 1:36 PM) Colonoscopy never Mammogram within last year Pap Smear 1-5 years ago  Allergies [name], CMA; 01/02/2016 1:37 PM) Shrimp Flavor *PHARMACEUTICAL ADJUVANTS* Anaphylaxis, Swelling.  Medication History [name], CMA; 01/02/2016 1:37 PM) Albuterol Sulfate HFA (108 (90 Base)MCG/ACT Aerosol Soln, Inhalation) Active. Hydrocodone-Acetaminophen (5-325MG  Tablet, Oral) Active. Multiple Vitamin (Oral) Active. Tribenzor (20-5-12.5MG  Tablet, Oral) Active. Ondansetron (4MG  Tablet Disint, Oral) Active. Tamoxifen Citrate (20MG  Tablet, Oral) Active. Medications Reconciled  Social History [name], [place]; 01/02/2016 1:36 PM) Alcohol use Occasional alcohol use. No caffeine use No drug use  Family History [name], [place]; 01/02/2016 1:36 PM) Arthritis Father. Diabetes Mellitus Mother. Heart Disease Mother. Heart disease in female family member before age 41 Hypertension Mother. Prostate Cancer Father.  Pregnancy / Birth History [name], CMA;  01/02/2016 1:36 PM) Age at menarche 71 years. Gravida 1 Maternal age 46-40 Para 1 Regular periods     Review of Systems [name] CMA; 01/02/2016 1:36 PM) General Present- Weight Gain. Not Present- Appetite Loss, Chills, Fatigue, Fever, Night Sweats and  Weight Loss. Skin Not Present- Change in Wart/Mole, Dryness, Hives, Jaundice, New Lesions, Non-Healing Wounds, Rash and Ulcer. Respiratory Present- Snoring. Not Present- Bloody sputum, Chronic Cough, Difficulty Breathing and Wheezing. Breast Not Present- Breast Mass, Breast Pain, Nipple Discharge and Skin Changes. Gastrointestinal Present- Constipation. Not Present- Abdominal Pain, Bloating, Bloody Stool, Change in Bowel Habits, Chronic diarrhea, Difficulty Swallowing, Excessive gas, Gets full quickly at meals, Hemorrhoids, Indigestion, Nausea, Rectal Pain and Vomiting. Female Genitourinary Not Present- Frequency, Nocturia, Painful Urination, Pelvic Pain and Urgency. Musculoskeletal Present- Back Pain. Not Present- Joint Pain, Joint Stiffness, Muscle Pain, Muscle Weakness and Swelling of Extremities. Neurological Not Present- Decreased Memory, Fainting, Headaches, Numbness, Seizures, Tingling, Tremor, Trouble walking and Weakness. Psychiatric Not Present- Anxiety, Bipolar, Change in Sleep Pattern, Depression, Fearful and Frequent crying. Endocrine Not Present- Cold Intolerance, Excessive Hunger, Hair Changes, Heat Intolerance, Hot flashes and New Diabetes. Hematology Not Present- Blood Thinners, Easy Bruising, Excessive bleeding, Gland problems, HIV and Persistent Infections.  Vitals [name] CMA; 01/02/2016 1:38 PM) 01/02/2016 1:37 PM Weight: 233.6 lb Height: 63in Body Surface Area: 2.07 m Body Mass Index: 41.38 kg/m  Temp.: 98.26F(Temporal)  Pulse: 91 (Regular)  BP: 146/80 (Sitting, Left Arm, Standard)      Physical Exam (Eion Timbrook A. Bridgid Printz MD; 01/02/2016 2:04 PM)  General Mental  Status-Alert. General Appearance-Consistent with stated age. Hydration-Well hydrated. Voice-Normal.  Head and Neck Head-normocephalic, atraumatic with no lesions or palpable masses.  Eye Eyeball - Bilateral-Extraocular movements intact. Sclera/Conjunctiva - Bilateral-No scleral icterus.  Chest and Lung Exam Chest and lung exam reveals -quiet, even and easy respiratory effort with no use of accessory muscles and on auscultation, normal breath sounds, no adventitious sounds and normal vocal resonance. Inspection Chest Wall - Normal. Back - normal.  Cardiovascular Cardiovascular examination reveals -on palpation PMI is normal in location and amplitude, no palpable S3 or S4. Normal cardiac borders., normal heart sounds, regular rate and rhythm with no murmurs, carotid auscultation reveals no bruits and normal pedal pulses bilaterally.  Abdomen Inspection Inspection of the abdomen reveals - No Hernias. Skin - Scar - no surgical scars. Palpation/Percussion Palpation and Percussion of the abdomen reveal - Soft, Non Tender, No Rebound tenderness, No Rigidity (guarding) and No hepatosplenomegaly. Auscultation Auscultation of the abdomen reveals - Bowel sounds normal.  Neurologic Neurologic evaluation reveals -alert and oriented x 3 with no impairment of recent or remote memory. Mental Status-Normal.  Musculoskeletal Normal Exam - Left-Upper Extremity Strength Normal and Lower Extremity Strength Normal. Normal Exam - Right-Upper Extremity Strength Normal, Lower Extremity Weakness.    Assessment & Plan (Karim Aiello A. Zyire Eidson MD; 01/02/2016 2:04 PM)  SYMPTOMATIC CHOLELITHIASIS (K80.20) Impression: Discussed treatment options of laparoscopic cholecystectomy and cholangiogram versus medical management. Risks, benefits and alternatives were discussed with the patient. Long-term expectations were discussed with the patient. The procedure has been discussed with the  patient. Risks of laparoscopic cholecystectomy include bleeding, infection, bile duct injury, leak, death, open surgery, diarrhea, other surgery, organ injury, blood vessel injury, DVT, and additional care.  Current Plans You are being scheduled for surgery - Our schedulers will call you.  You should hear from our office's scheduling department within 5 working days about the location, date, and time of surgery. We try to make accommodations for patient's preferences in scheduling surgery, but sometimes the OR schedule or the surgeon's schedule prevents [place] from making those accommodations.  If you have not heard from our office [phone]) in 5 working days, call the office and ask for your surgeon's nurse.  If you have other questions about your diagnosis, plan, or surgery, call the office and ask for your surgeon's nurse.  Pt Education - Pamphlet Given - Laparoscopic Gallbladder Surgery: discussed with patient and provided information. The anatomy & physiology of hepatobiliary & pancreatic function was discussed. The pathophysiology of gallbladder dysfunction was discussed. Natural history risks without surgery was discussed. I feel the risks of no intervention will lead to serious problems that outweigh the operative risks; therefore, I recommended cholecystectomy to remove the pathology. I explained laparoscopic techniques with possible need for an open approach. Probable cholangiogram to evaluate the bilary tract was explained as well.  Risks such as bleeding, infection, abscess, leak, injury to other organs, need for further treatment, heart attack, death, and other risks were discussed. I noted a good likelihood this will help address the problem. Possibility that this will not correct all abdominal symptoms was explained. Goals of post-operative recovery were discussed as well. We will work to minimize complications. An educational handout further explaining the pathology and treatment  options was given as well. Questions were answered. The patient expresses understanding & wishes to proceed with surgery.  Pt Education - [facility] Laparosopic Post Op HCI ([place]) Pt Education - [facility] Good Bowel Health ([place]) Pt Education - Laparoscopic Cholecystectomy: gallbladder

## 2016-01-31 NOTE — OR Nursing (Signed)
Discharge instructions were reviewed with pt and pt step mother. Both denied any questions regarding d/c instructions. Pt stated that she was a little nauseated but refused any medications for nausea. Pain level tolerable as well.

## 2016-02-01 ENCOUNTER — Encounter (HOSPITAL_COMMUNITY): Payer: Self-pay | Admitting: Surgery

## 2016-02-21 ENCOUNTER — Other Ambulatory Visit: Payer: Self-pay | Admitting: Family Medicine

## 2016-03-03 ENCOUNTER — Telehealth: Payer: Self-pay | Admitting: Family Medicine

## 2016-03-03 NOTE — Telephone Encounter (Signed)
Recv'd P.A. Tribenzor, not our patient, faxed back

## 2016-03-21 NOTE — Congregational Nurse Program (Signed)
Congregational Nurse Program Note  Date of Encounter: 03/01/2016  Past Medical History: Past Medical History:  Diagnosis Date  . Anemia    history of blood transfusion-no abnormal reaction noted  . Arthritis   . Asthma    Albuterol as needed  . 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 Diabetes mellitus without complication ([place])    takes Metformin daily  . History of migraine    several yrs ago  . Hypertension    takes Tribenzor daily  . Joint pain     Encounter Details:     CNP Questionnaire - 03/21/16 0101      Patient Demographics   Is this a new or existing patient? New   Patient is considered a/an Not Applicable   Race African-American/Black     Patient Assistance   Location of Patient [place]   Patient's financial/insurance status Low Income   Uninsured Patient ([place] Card/Care Connects) Yes   Interventions Not Applicable   Patient referred to apply for the following financial assistance Not Applicable   Food insecurities addressed Not Applicable   Transportation assistance No   Assistance securing medications No   Educational health offerings Other     Encounter Details   Primary purpose of visit Spiritual Care/Support Visit   Was an Emergency Department visit averted? Not Applicable   Does patient have a medical provider? Yes   Patient referred to Not Applicable   Was a mental health screening completed? (GAINS tool) No   Does patient have dental issues? No   Does patient have vision issues? No   Does your patient have an abnormal blood pressure today? No   Since previous encounter, have you referred patient for abnormal blood pressure that resulted in a new diagnosis or medication change? No   Does your patient have an abnormal blood glucose today? No   Since previous encounter, have you referred patient for abnormal blood glucose that resulted in a new diagnosis or medication change? No   Was there  a life-saving intervention made? No      Shared resource in the community that would let children pick out gifts for family and have them wrapped.  Client interested.  Has 2 children

## 2016-05-22 ENCOUNTER — Encounter: Payer: Self-pay | Admitting: Obstetrics & Gynecology

## 2016-06-20 ENCOUNTER — Ambulatory Visit (HOSPITAL_COMMUNITY)
Admission: EM | Admit: 2016-06-20 | Discharge: 2016-06-20 | Disposition: A | Discharge status: Home / Self Care | Payer: Medicaid Other | Attending: Family Medicine | Admitting: Family Medicine

## 2016-06-20 ENCOUNTER — Encounter (HOSPITAL_COMMUNITY): Payer: Self-pay | Admitting: Family Medicine

## 2016-06-20 DIAGNOSIS — M79605 Pain in left leg: Secondary | ICD-10-CM

## 2016-06-20 DIAGNOSIS — M5440 Lumbago with sciatica, unspecified side: Secondary | ICD-10-CM

## 2016-06-20 DIAGNOSIS — M79604 Pain in right leg: Secondary | ICD-10-CM

## 2016-06-20 MED ORDER — METHYLPREDNISOLONE 4 MG PO TBPK: ORAL_TABLET | ORAL | 0 refills | Status: DC

## 2016-06-20 MED ORDER — CYCLOBENZAPRINE HCL 10 MG PO TABS: 10.0000 mg | ORAL_TABLET | Freq: Two times a day (BID) | ORAL | 0 refills | Status: DC | PRN

## 2016-06-20 NOTE — ED Triage Notes (Signed)
Pt here for lower back pain with bilateral leg pain radiating into feet x 3 days. sts aching

## 2016-06-20 NOTE — ED Provider Notes (Signed)
CSN: [id number]     Arrival date & time 06/20/16  1857 History   First MD Initiated Contact with Patient 06/20/16 1940     Chief Complaint  Patient presents with  . Leg Pain   (Consider location/radiation/quality/duration/timing/severity/associated sxs/prior Treatment) Patient c/o lower back pain   The history is provided by the patient.  Leg Pain  Location:  Leg Time since incident:  3 days Leg location:  L leg and R leg Pain details:    Quality:  Aching   Radiates to:  Back   Severity:  Mild   Onset quality:  Sudden   Duration:  3 days   Timing:  Constant   Progression:  Unchanged Chronicity:  New Dislocation: no   Foreign body present:  Unable to specify Tetanus status:  Unknown Prior injury to area:  No Relieved by:  Nothing Worsened by:  Nothing Ineffective treatments:  None tried   Past Medical History:  Diagnosis Date  . Anemia    history of blood transfusion-no abnormal reaction noted  . Arthritis   . Asthma    Albuterol as needed  . 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 Diabetes mellitus without complication ([place])    takes Metformin daily  . History of migraine    several yrs ago  . Hypertension    takes Tribenzor daily  . Joint pain    Past Surgical History:  Procedure Laterality Date  . BIOPSY BREAST Left 12/05/12   fibroadenoma  . BREAST LUMPECTOMY     2014  . BREAST SURGERY Right   . CESAREAN SECTION  01/05/2009  . CHOLECYSTECTOMY N/A 01/31/2016   Procedure: LAPAROSCOPIC CHOLECYSTECTOMY;  Surgeon: Erroll Luna, MD;  Location: [facility];  Service: General;  Laterality: N/A;  . DILITATION & CURRETTAGE/HYSTROSCOPY WITH HYDROTHERMAL ABLATION N/A 08/12/2013   Procedure: DILATATION & CURETTAGE/HYSTEROSCOPY WITH HYDROTHERMAL ABLATION;  Surgeon: Frederico Hamman, MD;  Location: [facility];  Service: Gynecology;  Laterality: N/A;   Family History  Problem Relation Age of Onset  . Hypertension Mother   . Diabetes Mother   .  Breast cancer Paternal Grandmother     dx in her 74s  . Prostate cancer Paternal Grandfather   . Prostate cancer Father 27  . Prostate cancer Paternal Uncle   . Breast cancer Other     paternal grandmother's sister  . Anesthesia problems Neg Hx    Social History  Substance Use Topics  . Smoking status: Never Smoker  . Smokeless tobacco: Never Used  . Alcohol use 0.6 oz/week    1 Cans of beer per week     Comment: occa   OB History    Gravida Para Term Preterm AB Living   1 1 1  0 0 1   SAB TAB Ectopic Multiple Live Births   0 0 0 0 1     Review of Systems  Constitutional: Negative.   HENT: Negative.   Eyes: Negative.   Respiratory: Negative.   Cardiovascular: Negative.   Gastrointestinal: Negative.   Endocrine: Negative.   Genitourinary: Negative.   Musculoskeletal: Positive for arthralgias.  Allergic/Immunologic: Negative.   Neurological: Negative.   Hematological: Negative.   Psychiatric/Behavioral: Negative.     Allergies  Shrimp [shellfish allergy] and Shellfish-derived products  Home Medications   Prior to Admission medications   Medication Sig Start Date End Date Taking? Authorizing Provider  albuterol (PROAIR HFA) 108 (90 BASE) MCG/ACT inhaler Inhale 1 puff into the lungs every 6 (six)  hours as needed for shortness of breath.     Historical Provider, MD  cyclobenzaprine (FLEXERIL) 10 MG tablet Take 1 tablet (10 mg total) by mouth 2 (two) times daily as needed for muscle spasms. 06/20/16   Lysbeth Penner, FNP  ibuprofen (ADVIL,MOTRIN) 600 MG tablet Take 1 tablet (600 mg total) by mouth every 6 (six) hours as needed. Patient not taking: Reported on 01/20/2016 11/05/13   Seabron Spates, CNM  metFORMIN (GLUCOPHAGE) 500 MG tablet Take 500 mg by mouth daily. 12/09/15   Historical Provider, MD  methylPREDNISolone (MEDROL DOSEPAK) 4 MG TBPK tablet Take 6-5-4-3-2-1 po qd 06/20/16   Lysbeth Penner, FNP  Multiple Vitamins-Minerals (MULTIVITAMIN PO) Take 1 tablet by  mouth daily.    Historical Provider, MD  Olmesartan-Amlodipine-HCTZ (TRIBENZOR) 20-5-12.5 MG TABS Take 1 tablet by mouth every morning. 07/02/12   Nat Christen, MD  ondansetron (ZOFRAN ODT) 4 MG disintegrating tablet Take 1 tablet (4 mg total) by mouth every 8 (eight) hours as needed for nausea or vomiting. 12/18/15   Isla Pence, MD  oxyCODONE-acetaminophen (ROXICET) 5-325 MG tablet Take 1-2 tablets by mouth every 4 (four) hours as needed for severe pain. 01/31/16   Erroll Luna, MD  tamoxifen (NOLVADEX) 20 MG tablet TAKE 1 TABLET BY MOUTH EVERY DAY 03/23/15   Nicholas Lose, MD   Meds Ordered and Administered this Visit  Medications - No data to display  BP (!) 163/103   Pulse (!) 104   Resp 18   LMP 05/31/2016   SpO2 98%  No data found.   Physical Exam  Constitutional: She appears well-developed and well-nourished.  HENT:  Head: Normocephalic and atraumatic.  Eyes: Conjunctivae and EOM are normal. Pupils are equal, round, and reactive to light.  Neck: Normal range of motion. Neck supple.  Cardiovascular: Normal rate, regular rhythm and normal heart sounds.   Pulmonary/Chest: Effort normal and breath sounds normal.  Abdominal: Soft. Bowel sounds are normal.  Musculoskeletal: She exhibits tenderness.  TTP Lumbar spine Neg SLR bilateral  Nursing note and vitals reviewed.   Urgent Care Course     Procedures (including critical care time)  Labs Review Labs Reviewed - No data to display  Imaging Review No results found.   Visual Acuity Review  Right Eye Distance:   Left Eye Distance:   Bilateral Distance:    Right Eye Near:   Left Eye Near:    Bilateral Near:         MDM   1. Bilateral leg pain   2. Bilateral low back pain with sciatica, sciatica laterality unspecified, unspecified chronicity    Medrol dose pack Flexeril 500mg  one po bid       Lysbeth Penner, FNP 06/20/16 2024

## 2016-08-06 ENCOUNTER — Encounter (HOSPITAL_COMMUNITY): Payer: Self-pay | Admitting: Emergency Medicine

## 2016-08-06 ENCOUNTER — Emergency Department (HOSPITAL_COMMUNITY)
Admission: EM | Admit: 2016-08-06 | Discharge: 2016-08-06 | Disposition: A | Discharge status: Home / Self Care | Payer: Medicaid Other | Attending: Emergency Medicine | Admitting: Emergency Medicine

## 2016-08-06 DIAGNOSIS — R109 Unspecified abdominal pain: Secondary | ICD-10-CM

## 2016-08-06 DIAGNOSIS — Z7984 Long term (current) use of oral hypoglycemic drugs: Secondary | ICD-10-CM | POA: Diagnosis not present

## 2016-08-06 DIAGNOSIS — E119 Type 2 diabetes mellitus without complications: Secondary | ICD-10-CM | POA: Diagnosis not present

## 2016-08-06 DIAGNOSIS — N939 Abnormal uterine and vaginal bleeding, unspecified: Secondary | ICD-10-CM | POA: Diagnosis present

## 2016-08-06 DIAGNOSIS — Z853 Personal history of malignant neoplasm of breast: Secondary | ICD-10-CM | POA: Insufficient documentation

## 2016-08-06 DIAGNOSIS — J45909 Unspecified asthma, uncomplicated: Secondary | ICD-10-CM | POA: Diagnosis not present

## 2016-08-06 DIAGNOSIS — R1030 Lower abdominal pain, unspecified: Secondary | ICD-10-CM | POA: Insufficient documentation

## 2016-08-06 DIAGNOSIS — Z79899 Other long term (current) drug therapy: Secondary | ICD-10-CM | POA: Diagnosis not present

## 2016-08-06 DIAGNOSIS — N879 Dysplasia of cervix uteri, unspecified: Secondary | ICD-10-CM | POA: Diagnosis not present

## 2016-08-06 LAB — URINALYSIS, ROUTINE W REFLEX MICROSCOPIC
Bilirubin Urine: NEGATIVE
Glucose, UA: NEGATIVE mg/dL
Hgb urine dipstick: NEGATIVE
Ketones, ur: NEGATIVE mg/dL
LEUKOCYTES UA: NEGATIVE
NITRITE: NEGATIVE
PROTEIN: NEGATIVE mg/dL
Specific Gravity, Urine: 1.017 (ref 1.005–1.030)
pH: 5 (ref 5.0–8.0)

## 2016-08-06 LAB — COMPREHENSIVE METABOLIC PANEL
ALT: 11 U/L — ABNORMAL LOW (ref 14–54)
AST: 14 U/L — AB (ref 15–41)
Albumin: 4 g/dL (ref 3.5–5.0)
Alkaline Phosphatase: 24 U/L — ABNORMAL LOW (ref 38–126)
Anion gap: 8 (ref 5–15)
BILIRUBIN TOTAL: 0.4 mg/dL (ref 0.3–1.2)
BUN: 18 mg/dL (ref 6–20)
CALCIUM: 9 mg/dL (ref 8.9–10.3)
CO2: 24 mmol/L (ref 22–32)
CREATININE: 0.72 mg/dL (ref 0.44–1.00)
Chloride: 107 mmol/L (ref 101–111)
GFR calc Af Amer: >60 mL/min (ref >60)
Glucose, Bld: 96 mg/dL (ref 65–99)
POTASSIUM: 3.6 mmol/L (ref 3.5–5.1)
Sodium: 139 mmol/L (ref 135–145)
TOTAL PROTEIN: 7.6 g/dL (ref 6.5–8.1)

## 2016-08-06 LAB — CBC
HEMATOCRIT: 34 % — AB (ref 36.0–46.0)
Hemoglobin: 10.8 g/dL — ABNORMAL LOW (ref 12.0–15.0)
MCH: 23.5 pg — ABNORMAL LOW (ref 26.0–34.0)
MCHC: 31.8 g/dL (ref 30.0–36.0)
MCV: 74.1 fL — ABNORMAL LOW (ref 78.0–100.0)
PLATELETS: 267 10*3/uL (ref 150–400)
RBC: 4.59 MIL/uL (ref 3.87–5.11)
RDW: 16.4 % — ABNORMAL HIGH (ref 11.5–15.5)
WBC: 4.4 10*3/uL (ref 4.0–10.5)

## 2016-08-06 LAB — WET PREP, GENITAL
CLUE CELLS WET PREP: NONE SEEN
Sperm: NONE SEEN
TRICH WET PREP: NONE SEEN
Yeast Wet Prep HPF POC: NONE SEEN

## 2016-08-06 LAB — LIPASE, BLOOD: Lipase: 28 U/L (ref 11–51)

## 2016-08-06 LAB — POC URINE PREG, ED: Preg Test, Ur: NEGATIVE

## 2016-08-06 NOTE — ED Provider Notes (Signed)
[facility] DEPT Provider Note   CSN: [id number] Arrival date & time: 08/06/16  1327     History   Chief Complaint Chief Complaint  Patient presents with  . Vaginal Bleeding  . Abdominal Pain    HPI Sheryl Cox is a 46 y.o. female.  The history is provided by the patient.  Vaginal Bleeding  Primary symptoms include vaginal bleeding.  Primary symptoms include no dysuria. There has been no fever. The patient's menstrual history has been irregular (irregular period started early April; bleeding for 1-2 days and then stop. restarts 1-2 days later.). Associated symptoms include abdominal pain. Pertinent negatives include no diarrhea, no nausea and no vomiting. Sexual activity: sexually active. There is a concern regarding sexually transmitted diseases.  Abdominal Pain   This is a recurrent (when she had GB removed (3 months ago)) problem. The current episode started 3 to 5 hours ago. Episode frequency: intermittent. The problem has not changed since onset.The pain is associated with an unknown factor. The pain is located in the suprapubic region. The pain is moderate. Pertinent negatives include fever, diarrhea, hematochezia, melena, nausea, vomiting and dysuria. Nothing aggravates the symptoms. The symptoms are relieved by certain positions. Past workup includes CT scan. Her past medical history is significant for gallstones (s/p [place] 3 months ago).    Past Medical History:  Diagnosis Date  . Anemia    history of blood transfusion-no abnormal reaction noted  . Arthritis   . Asthma    Albuterol as needed  . 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 Diabetes mellitus without complication ([place])    takes Metformin daily  . History of migraine    several yrs ago  . Hypertension    takes Tribenzor daily  . Joint pain     Patient Active Problem List   Diagnosis Date Noted  . Anemia 06/05/2013  . Menorrhagia 06/05/2013  . DCIS (ductal  carcinoma in situ) 11/26/2012  . Breast cancer of upper-outer quadrant of right female breast ([place]) 11/24/2012  . Nabothian cyst 12/07/2011  . Cervical mass 11/20/2011  . Fibroid uterus 11/20/2011    Past Surgical History:  Procedure Laterality Date  . BIOPSY BREAST Left 12/05/12   fibroadenoma  . BREAST LUMPECTOMY     2014  . BREAST SURGERY Right   . CESAREAN SECTION  01/05/2009  . CHOLECYSTECTOMY N/A 01/31/2016   Procedure: LAPAROSCOPIC CHOLECYSTECTOMY;  Surgeon: Erroll Luna, MD;  Location: [facility];  Service: General;  Laterality: N/A;  . DILITATION & CURRETTAGE/HYSTROSCOPY WITH HYDROTHERMAL ABLATION N/A 08/12/2013   Procedure: DILATATION & CURETTAGE/HYSTEROSCOPY WITH HYDROTHERMAL ABLATION;  Surgeon: Frederico Hamman, MD;  Location: [facility];  Service: Gynecology;  Laterality: N/A;    OB History    Gravida Para Term Preterm AB Living   1 1 1  0 0 1   SAB TAB Ectopic Multiple Live Births   0 0 0 0 1       Home Medications    Prior to Admission medications   Medication Sig Start Date End Date Taking? Authorizing Provider  albuterol (PROAIR HFA) 108 (90 BASE) MCG/ACT inhaler Inhale 1 puff into the lungs every 6 (six) hours as needed for shortness of breath.     [provider]  cyclobenzaprine (FLEXERIL) 10 MG tablet Take 1 tablet (10 mg total) by mouth 2 (two) times daily as needed for muscle spasms. 06/20/16   Lysbeth Penner, FNP  ibuprofen (ADVIL,MOTRIN) 600 MG tablet Take 1 tablet (  600 mg total) by mouth every 6 (six) hours as needed. Patient not taking: Reported on 01/20/2016 11/05/13   Seabron Spates, CNM  metFORMIN (GLUCOPHAGE) 500 MG tablet Take 500 mg by mouth daily. 12/09/15   [provider]  methylPREDNISolone (MEDROL DOSEPAK) 4 MG TBPK tablet Take 6-5-4-3-2-1 po qd 06/20/16   Lysbeth Penner, FNP  Multiple Vitamins-Minerals (MULTIVITAMIN PO) Take 1 tablet by mouth daily.    [provider]  Olmesartan-Amlodipine-HCTZ (TRIBENZOR) 20-5-12.5  MG TABS Take 1 tablet by mouth every morning. 07/02/12   Nat Christen, MD  ondansetron (ZOFRAN ODT) 4 MG disintegrating tablet Take 1 tablet (4 mg total) by mouth every 8 (eight) hours as needed for nausea or vomiting. 12/18/15   Isla Pence, MD  oxyCODONE-acetaminophen (ROXICET) 5-325 MG tablet Take 1-2 tablets by mouth every 4 (four) hours as needed for severe pain. 01/31/16   Cornett, [name], MD  tamoxifen (NOLVADEX) 20 MG tablet TAKE 1 TABLET BY MOUTH EVERY DAY 03/23/15   Nicholas Lose, MD    Family History Family History  Problem Relation Age of Onset  . Hypertension Mother   . Diabetes Mother   . Breast cancer Paternal Grandmother        dx in her 23s  . Prostate cancer Paternal Grandfather   . Prostate cancer Father 16  . Prostate cancer Paternal Uncle   . Breast cancer Other        paternal grandmother's sister  . Anesthesia problems Neg Hx     Social History Social History  Substance Use Topics  . Smoking status: Never Smoker  . Smokeless tobacco: Never Used  . Alcohol use 0.6 oz/week    1 Cans of beer per week     Comment: occa     Allergies   Shrimp [shellfish allergy] and Shellfish-derived products   Review of Systems Review of Systems  Constitutional: Negative for fever.  Gastrointestinal: Positive for abdominal pain. Negative for diarrhea, hematochezia, melena, nausea and vomiting.  Genitourinary: Positive for vaginal bleeding. Negative for dysuria.  All other systems are reviewed and are negative for acute change except as noted in the HPI    Physical Exam Updated Vital Signs BP (!) 156/96 (BP Location: Right Arm)   Pulse 100   Temp 99.1 F (37.3 C) (Oral)   Resp 16   Ht 5\' 3"  (1.6 m)   Wt 105.7 kg (233 lb)   SpO2 97%   BMI 41.27 kg/m   Physical Exam  Constitutional: She is oriented to person, place, and time. She appears well-developed and well-nourished. No distress.  HENT:  Head: Normocephalic and atraumatic.  Nose: Nose normal.  Eyes:  Conjunctivae and EOM are normal. Pupils are equal, round, and reactive to light. Right eye exhibits no discharge. Left eye exhibits no discharge. No scleral icterus.  Neck: Normal range of motion. Neck supple.  Cardiovascular: Normal rate and regular rhythm.  Exam reveals no gallop and no friction rub.   No murmur heard. Pulmonary/Chest: Effort normal and breath sounds normal. No stridor. No respiratory distress. She has no rales.  Abdominal: Soft. She exhibits no distension. There is tenderness (mild discomfort) in the suprapubic area. There is no rigidity, no rebound, no guarding, no tenderness at McBurney's point and negative Murphy's sign.  Genitourinary: Pelvic exam was performed with patient supine. Cervix exhibits no motion tenderness, no discharge and no friability. Right adnexum displays no mass and no tenderness. Left adnexum displays no mass and no tenderness. No erythema, tenderness or  bleeding in the vagina. No foreign body in the vagina. Vaginal discharge (thick, yellowish; scant) found.  Genitourinary Comments: Cervical Os with dysplastic changes.  Chaperone present during pelvic exam.   Musculoskeletal: She exhibits no edema or tenderness.  Neurological: She is alert and oriented to person, place, and time.  Skin: Skin is warm and dry. No rash noted. She is not diaphoretic. No erythema.  Psychiatric: She has a normal mood and affect.  Vitals reviewed.    ED Treatments / Results  Labs (all labs ordered are listed, but only abnormal results are displayed) Labs Reviewed  WET PREP, GENITAL - Abnormal; Notable for the following:       Result Value   WBC, Wet Prep HPF POC MANY (*)    All other components within normal limits  COMPREHENSIVE METABOLIC PANEL - Abnormal; Notable for the following:    AST 14 (*)    ALT 11 (*)    Alkaline Phosphatase 24 (*)    All other components within normal limits  CBC - Abnormal; Notable for the following:    Hemoglobin 10.8 (*)    HCT 34.0  (*)    MCV 74.1 (*)    MCH 23.5 (*)    RDW 16.4 (*)    All other components within normal limits  LIPASE, BLOOD  URINALYSIS, ROUTINE W REFLEX MICROSCOPIC  POC URINE PREG, ED  GC/CHLAMYDIA PROBE AMP ([place]) NOT AT [facility]    EKG  EKG Interpretation None       Radiology No results found.  Procedures Procedures (including critical care time)  Medications Ordered in ED Medications - No data to display   Initial Impression / Assessment and Plan / ED Course  I have reviewed the triage vital signs and the nursing notes.  Pertinent labs & imaging results that were available during my care of the patient were reviewed by me and considered in my medical decision making (see chart for details).     Suprapubic discomfort that has been ongoing for several months. He thought initially to be related to gallbladder disease however patient is status post cholecystectomy and still having her symptoms. Prior CT in October 2017 noted multiple uterine fibroids. Given her recent irregular menstrual cycle this possibility of these causing her symptomatology. On exam patient had mild suprapubic discomfort. Pelvic exam with scant, thick, yellowish discharge. Cervical dysplasia. No friability. No cervical motion tenderness. No adnexal tenderness. Low suspicion for PID. GC/chlamydia swabs and wet prep obtained. Low suspicion for ovarian torsion or TOA.  Labs grossly reassuring without evidence of leukocytosis, biliary obstruction, pancreatitis. Low suspicion for serious intra-abdominal inflammatory/infectious process such as appendicitis, diverticulitis, colitis.  Currently awaiting vaginal swabs and urinalysis.  UA without evidence of infection. Wet prep without trichomonas or bacterial vaginosis. Exam was not concerning for cervicitis due to the sexual transmitted disease. We will await the GC/chlamydia cultures and treat if the result positive.  The patient is safe for discharge with strict  return precautions.   Final Clinical Impressions(s) / ED Diagnoses   Final diagnoses:  Cervical dysplasia  Abnormal vaginal bleeding  Abdominal cramping   Disposition: Discharge  Condition: Good  I have discussed the results, Dx and Tx plan with the patient who expressed understanding and agree(s) with the plan. Discharge instructions discussed at great length. The patient was given strict return precautions who verbalized understanding of the instructions. No further questions at time of discharge.    New Prescriptions   No medications on file  Follow Up: [facility] [phone]  to follow up for PAP smear given you cervical changes and uterine fibroids with irrgular bleeding      Esmerelda Finnigan, Grayce Sessions, MD 08/06/16 1659

## 2016-08-06 NOTE — ED Notes (Signed)
Bed: JQ96 Expected date:  Expected time:  Means of arrival:  Comments: LOBBY DRS 46 yo F

## 2016-08-06 NOTE — ED Triage Notes (Signed)
Pt here for for increased lower and upper abdominal pain and increased irregular vaginal bleeding. Pt states she has seen her PCP for this and she was told "she might have a hernia. Pt reports pressure occasionally with urination, but denies pain or discharge. Pt states this began last month

## 2016-08-07 LAB — GC/CHLAMYDIA PROBE AMP (~~LOC~~) NOT AT ARMC
CHLAMYDIA, DNA PROBE: NEGATIVE
Neisseria Gonorrhea: NEGATIVE

## 2016-08-08 ENCOUNTER — Encounter: Payer: Medicaid Other | Admitting: Obstetrics & Gynecology

## 2016-09-10 ENCOUNTER — Ambulatory Visit (HOSPITAL_BASED_OUTPATIENT_CLINIC_OR_DEPARTMENT_OTHER): Payer: Medicaid Other | Admitting: Hematology and Oncology

## 2016-09-10 ENCOUNTER — Encounter: Payer: Self-pay | Admitting: Hematology and Oncology

## 2016-09-10 DIAGNOSIS — C50411 Malignant neoplasm of upper-outer quadrant of right female breast: Secondary | ICD-10-CM

## 2016-09-10 DIAGNOSIS — D509 Iron deficiency anemia, unspecified: Secondary | ICD-10-CM

## 2016-09-10 DIAGNOSIS — D0511 Intraductal carcinoma in situ of right breast: Secondary | ICD-10-CM

## 2016-09-10 DIAGNOSIS — I10 Essential (primary) hypertension: Secondary | ICD-10-CM | POA: Diagnosis not present

## 2016-09-10 DIAGNOSIS — Z170 Estrogen receptor positive status [ER+]: Secondary | ICD-10-CM | POA: Diagnosis not present

## 2016-09-10 NOTE — Progress Notes (Signed)
Patient Care Team: Lucianne Lei, MD as PCP - General (Family Medicine)  DIAGNOSIS:  Encounter Diagnosis  Name Primary?  . Malignant neoplasm of upper-outer quadrant of right breast in female, estrogen receptor positive ([place])     SUMMARY OF ONCOLOGIC HISTORY:   Breast cancer of upper-outer quadrant of right female breast ([place])   11/06/2012 Mammogram     Numerous calcifications appear both linear in morphology and linear in distribution and extend over a large portion of the breast.  The longest area is 6.0 x 1.2 cm.  A second area is 6.0 x 1.4 cm.  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      11/26/2012 Breast MRI    upper inner quadrant of the posterior left breast, there is an 8 x 7 x 7 mm circumscribed enhancing mass;       03/25/2013 - 05/02/2013 Radiation Therapy    50 Gy to the right breast       05/19/2013 -  Anti-estrogen oral therapy    Tamoxifen 20 mg daily      02/23/2014 Surgery    Right Lumpectomy: At [facility]; DCIS, ER/PR Positive       CHIEF COMPLIANT: Follow-up on tamoxifen therapy  INTERVAL HISTORY: Sheryl Cox is a 46 year old above-mentioned history from right breast DCIS that was ER/PR positive. She is currently on tamoxifen and appears to be tolerating it fairly well. She's been on it for the past 3 and 1/2 years. She denies any hot flashes or myalgias. She gets her mammograms done at [facility] in November of every year. She denies any lumps or nodules in breast. She had bilateral reduction mammoplasties  REVIEW OF SYSTEMS:   Constitutional: Denies fevers, chills or abnormal weight loss Eyes: Denies blurriness of vision Ears, nose, mouth, throat, and face: Denies mucositis or sore throat Respiratory: Denies cough, dyspnea or  wheezes Cardiovascular: Denies palpitation, chest discomfort Gastrointestinal:  Denies nausea, heartburn or change in bowel habits Skin: Denies abnormal skin rashes Lymphatics: Denies new lymphadenopathy or easy bruising Neurological:Denies numbness, tingling or new weaknesses Behavioral/Psych: Mood is stable, no new changes  Extremities: No lower extremity edema Breast:  denies any pain or lumps or nodules in either breasts: Bilateral reduction mammoplasty All other systems were reviewed with the patient and are negative.  I have reviewed the past medical history, past surgical history, social history and family history with the patient and they are unchanged from previous note.  ALLERGIES:  is allergic to shrimp [shellfish allergy] and shellfish-derived products.  MEDICATIONS:  Current Outpatient Prescriptions  Medication Sig Dispense Refill  . albuterol (PROAIR HFA) 108 (90 BASE) MCG/ACT inhaler Inhale 1 puff into the lungs every 6 (six) hours as needed for shortness of breath.     Marland Kitchen ibuprofen (ADVIL,MOTRIN) 600 MG tablet Take 1 tablet (600 mg total) by mouth every 6 (six) hours as needed. (Patient not taking: Reported on 01/20/2016) 30 tablet 1  . metFORMIN (GLUCOPHAGE) 500 MG tablet Take 500 mg by mouth daily.  2  . Multiple Vitamins-Minerals (MULTIVITAMIN PO) Take 1 tablet by mouth daily.    . Olmesartan-Amlodipine-HCTZ (TRIBENZOR) 20-5-12.5 MG TABS Take 1 tablet by mouth every morning. 30 tablet 1  . tamoxifen (NOLVADEX) 20 MG tablet TAKE 1 TABLET BY MOUTH  EVERY DAY 90 tablet 3   No current facility-administered medications for this visit.     PHYSICAL EXAMINATION: ECOG PERFORMANCE STATUS: 0 - Asymptomatic  Vitals:   09/10/16 1359  BP: (!) 178/108  Pulse: 93  Resp: 18  Temp: 98.7 F (37.1 C)    Filed Weights   09/10/16 1359  Weight: 231 lb 4.8 oz (104.9 kg)    GENERAL:alert, no distress and comfortable SKIN: skin color, texture, turgor are normal, no rashes or  significant lesions EYES: normal, Conjunctiva are pink and non-injected, sclera clear OROPHARYNX:no exudate, no erythema and lips, buccal mucosa, and tongue normal  NECK: supple, thyroid normal size, non-tender, without nodularity LYMPH:  no palpable lymphadenopathy in the cervical, axillary or inguinal LUNGS: clear to auscultation and percussion with normal breathing effort HEART: regular rate & rhythm and no murmurs and no lower extremity edema ABDOMEN:abdomen soft, non-tender and normal bowel sounds MUSCULOSKELETAL:no cyanosis of digits and no clubbing  NEURO: alert & oriented x 3 with fluent speech, no focal motor/sensory deficits EXTREMITIES: No lower extremity edema BREAST: No palpable masses or nodules in either right or left breasts. No palpable axillary supraclavicular or infraclavicular adenopathy no breast tenderness or nipple discharge. (exam performed in the presence of a chaperone)  LABORATORY DATA:  I have reviewed the data as listed   Chemistry      Component Value Date/Time   NA 139 08/06/2016 1354   NA 138 11/26/2012 1225   K 3.6 08/06/2016 1354   K 3.6 11/26/2012 1225   CL 107 08/06/2016 1354   CO2 24 08/06/2016 1354   CO2 28 11/26/2012 1225   BUN 18 08/06/2016 1354   BUN 11.5 11/26/2012 1225   CREATININE 0.72 08/06/2016 1354   CREATININE 0.8 11/26/2012 1225      Component Value Date/Time   CALCIUM 9.0 08/06/2016 1354   CALCIUM 9.8 11/26/2012 1225   ALKPHOS 24 (L) 08/06/2016 1354   ALKPHOS 32 (L) 11/26/2012 1225   AST 14 (L) 08/06/2016 1354   AST 13 11/26/2012 1225   ALT 11 (L) 08/06/2016 1354   ALT 14 11/26/2012 1225   BILITOT 0.4 08/06/2016 1354   BILITOT 0.27 11/26/2012 1225       Lab Results  Component Value Date   WBC 4.4 08/06/2016   HGB 10.8 (L) 08/06/2016   HCT 34.0 (L) 08/06/2016   MCV 74.1 (L) 08/06/2016   PLT 267 08/06/2016   NEUTROABS 9.7 (H) 12/18/2015    ASSESSMENT & PLAN:  Breast cancer of upper-outer quadrant of right female  breast Right breast DCIS ER/PR positive status post lumpectomy at [facility] currently on tamoxifen 10 mg daily March 2015  Tamoxifen Toxicities: Denies side effects of tamoxifen therapy. Hypertension: On blood pressure medication. Follows with her primary care physician.  Surveillance: Mammogram at [facility]  November 2017 is normal. She also follows up with her surgeon and with radiation oncologist. Breast exam 09/10/2016: Benign  Microcytic anemia: I discussed with her about checking iron levels. Patient is not interested in doing any blood work at this time. Instructed her to have her iron levels checked with any other physician when they draw blood work. RTC in one year for follow-up  I spent 15 minutes talking to the patient of which more than half was spent in counseling and coordination of care.  No orders of the defined types were placed in this encounter.  The patient has a good understanding of the overall plan. she agrees with it. she will  call with any problems that may develop before the next visit here.   Rulon Eisenmenger, MD 09/10/16

## 2016-09-10 NOTE — Assessment & Plan Note (Signed)
Right breast DCIS ER/PR positive status post lumpectomy at [facility] currently on tamoxifen 10 mg daily March 2015  Tamoxifen Toxicities: Denies side effects of tamoxifen therapy. She drinks a lot of water and this prevents muscle cramps and aches.  Surveillance: Mammogram at [facility] January 2018 is normal. She also follows up with her surgeon and with radiation oncologist. Breast exam 09/10/2016: Benign  RTC in one year for follow-up

## 2016-09-14 ENCOUNTER — Encounter: Payer: Self-pay | Admitting: Family Medicine

## 2016-09-14 ENCOUNTER — Other Ambulatory Visit (HOSPITAL_COMMUNITY)
Admission: RE | Admit: 2016-09-14 | Discharge: 2016-09-14 | Disposition: A | Discharge status: Home / Self Care | Payer: BLUE CROSS/BLUE SHIELD | Source: Ambulatory Visit | Attending: Family Medicine | Admitting: Family Medicine

## 2016-09-14 ENCOUNTER — Ambulatory Visit (INDEPENDENT_AMBULATORY_CARE_PROVIDER_SITE_OTHER): Payer: Medicaid Other | Admitting: Family Medicine

## 2016-09-14 ENCOUNTER — Other Ambulatory Visit (HOSPITAL_COMMUNITY)
Admission: RE | Admit: 2016-09-14 | Discharge: 2016-09-14 | Disposition: A | Discharge status: Home / Self Care | Payer: Medicaid Other | Source: Ambulatory Visit | Attending: Family Medicine | Admitting: Family Medicine

## 2016-09-14 VITALS — BP 136/85 | HR 98 | Ht 63.0 in | Wt 224.6 lb

## 2016-09-14 DIAGNOSIS — D250 Submucous leiomyoma of uterus: Secondary | ICD-10-CM

## 2016-09-14 DIAGNOSIS — D251 Intramural leiomyoma of uterus: Secondary | ICD-10-CM

## 2016-09-14 DIAGNOSIS — Z124 Encounter for screening for malignant neoplasm of cervix: Secondary | ICD-10-CM

## 2016-09-14 DIAGNOSIS — Z1151 Encounter for screening for human papillomavirus (HPV): Secondary | ICD-10-CM

## 2016-09-14 DIAGNOSIS — Z3202 Encounter for pregnancy test, result negative: Secondary | ICD-10-CM

## 2016-09-14 DIAGNOSIS — N920 Excessive and frequent menstruation with regular cycle: Secondary | ICD-10-CM | POA: Insufficient documentation

## 2016-09-14 LAB — POCT PREGNANCY, URINE: Preg Test, Ur: NEGATIVE

## 2016-09-14 NOTE — Patient Instructions (Signed)
Dysfunctional Uterine Bleeding °Dysfunctional uterine bleeding is abnormal bleeding from the uterus. Dysfunctional uterine bleeding includes: °· A period that comes earlier or later than usual. °· A period that is lighter, heavier, or has blood clots. °· Bleeding between periods. °· Skipping one or more periods. °· Bleeding after sexual intercourse. °· Bleeding after menopause. ° °Follow these instructions at home: °Pay attention to any changes in your symptoms. Follow these instructions to help with your condition: °Eating and drinking °· Eat well-balanced meals. Include foods that are high in iron, such as liver, meat, shellfish, green leafy vegetables, and eggs. °· If you become constipated: °? Drink plenty of water. °? Eat fruits and vegetables that are high in water and fiber, such as spinach, carrots, raspberries, apples, and mango. °Medicines °· Take over-the-counter and prescription medicines only as told by your health care provider. °· Do not change medicines without talking with your health care provider. °· Aspirin or medicines that contain aspirin may make the bleeding worse. Do not take those medicines: °? During the week before your period. °? During your period. °· If you were prescribed iron pills, take them as told by your health care provider. Iron pills help to replace iron that your body loses because of this condition. °Activity °· If you need to change your sanitary pad or tampon more than one time every 2 hours: °? Lie in bed with your feet raised (elevated). °? Place a cold pack on your lower abdomen. °? Rest as much as possible until the bleeding stops or slows down. °· Do not try to lose weight until the bleeding has stopped and your blood iron level is back to normal. °Other Instructions °· For two months, write down: °? When your period starts. °? When your period ends. °? When any abnormal bleeding occurs. °? What problems you notice. °· Keep all follow up visits as told by your health  care provider. This is important. °Contact a health care provider if: °· You get light-headed or weak. °· You have nausea and vomiting. °· You cannot eat or drink without vomiting. °· You feel dizzy or have diarrhea while you are taking medicines. °· You are taking birth control pills or hormones, and you want to change them or stop taking them. °Get help right away if: °· You develop a fever or chills. °· You need to change your sanitary pad or tampon more than one time per hour. °· Your bleeding becomes heavier, or your flow contains clots more often. °· You develop pain in your abdomen. °· You lose consciousness. °· You develop a rash. °This information is not intended to replace advice given to you by your health care provider. Make sure you discuss any questions you have with your health care provider. °Document Released: 03/02/2000 Document Revised: 08/11/2015 Document Reviewed: 05/31/2014 °Elsevier Interactive Patient Education © 2018 [employer] ° °

## 2016-09-14 NOTE — Progress Notes (Signed)
   Subjective:    Patient ID: Sheryl Cox is a 46 y.o. [id number]  female presenting with Gynecologic Exam  on 09/14/2016  HPI: New patient today. Previously in Dr. Marcheta Grammes practice. S/p HTA in 2014. Previous C-section x 1. Has h/o fibroids noted on CT. Last u/s done 2015 showed uterus to be 11 x 6 x 8 cm largest in fundus and 4.4 cm. On Tamoxifen for for breast cancer x 3.5 years.  Bleeds 5 days during a month. She is anemic. The bleeding is irregular and trickling. Having some hot flashes.  Review of Systems  Constitutional: Negative for chills and fever.  Respiratory: Negative for shortness of breath.   Cardiovascular: Negative for chest pain.  Gastrointestinal: Negative for abdominal pain, nausea and vomiting.  Genitourinary: Positive for menstrual problem and vaginal bleeding. Negative for dysuria.  Skin: Negative for rash.      Objective:    BP 136/85   Pulse 98   Ht 5\' 3"  (1.6 m)   Wt 224 lb 9.6 oz (101.9 kg)   LMP 09/10/2016 (Exact Date)   BMI 39.79 kg/m  Physical Exam  Constitutional: She is oriented to person, place, and time. She appears well-developed and well-nourished. No distress.  HENT:  Head: Normocephalic and atraumatic.  Eyes: No scleral icterus.  Neck: Neck supple.  Cardiovascular: Normal rate.   Pulmonary/Chest: Effort normal.  Abdominal: Soft.  Genitourinary:  Genitourinary Comments: Uterus feels enlarged 14-16 wk size  Neurological: She is alert and oriented to person, place, and time.  Skin: Skin is warm and dry.  Psychiatric: She has a normal mood and affect.   Procedure: Patient given informed consent, signed copy in the chart, time out was performed. Appropriate time out taken. The patient was placed in the lithotomy position and the cervix brought into view with sterile speculum.  Portio of cervix cleansed x 2 with betadine swabs.  A tenaculum was placed in the anterior lip of the cervix.  The uterus was sounded for depth of 7 cm. The  pipelle hit something hard preventing adequate sampling. A pipelle was introduced to into the uterus, suction created,  and an endometrial sample was obtained. All equipment was removed and accounted for.  The patient tolerated the procedure well.    Assessment & Plan:   Problem List Items Addressed This Visit      Unprioritized   Fibroid uterus    Known and feel some bigger--repeat u/s      Relevant Orders   US Pelvis Complete   US Transvaginal Non-OB   Menorrhagia - Primary    Probably combination of tamoxifen + fibroids--await EMB results--may need better endometrial sampling or definitive treatment.      Relevant Orders   TSH (Completed)   Follicle stimulating hormone (Completed)   Surgical pathology    Other Visit Diagnoses    Screening for cervical cancer       Relevant Orders   Cytology - PAP      Total face-to-face time with patient: 20 minutes. Over 50% of encounter was spent on counseling and coordination of care. Return in about 3 months (around 12/15/2016).  Donnamae Jude 09/14/2016 10:01 AM

## 2016-09-15 LAB — FOLLICLE STIMULATING HORMONE: FSH: 13.4 m[IU]/mL

## 2016-09-15 LAB — TSH: TSH: 2.46 u[IU]/mL (ref 0.450–4.500)

## 2016-09-15 NOTE — Assessment & Plan Note (Signed)
Known and feel some bigger--repeat u/s

## 2016-09-15 NOTE — Assessment & Plan Note (Signed)
Probably combination of tamoxifen + fibroids--await EMB results--may need better endometrial sampling or definitive treatment.

## 2016-09-17 ENCOUNTER — Ambulatory Visit (HOSPITAL_COMMUNITY)
Admission: RE | Admit: 2016-09-17 | Discharge: 2016-09-17 | Disposition: A | Discharge status: Home / Self Care | Payer: BLUE CROSS/BLUE SHIELD | Source: Ambulatory Visit | Attending: Family Medicine | Admitting: Family Medicine

## 2016-09-17 ENCOUNTER — Telehealth: Payer: Self-pay | Admitting: General Practice

## 2016-09-17 DIAGNOSIS — D252 Subserosal leiomyoma of uterus: Secondary | ICD-10-CM | POA: Insufficient documentation

## 2016-09-17 DIAGNOSIS — D251 Intramural leiomyoma of uterus: Secondary | ICD-10-CM | POA: Diagnosis not present

## 2016-09-17 DIAGNOSIS — D250 Submucous leiomyoma of uterus: Secondary | ICD-10-CM | POA: Insufficient documentation

## 2016-09-17 NOTE — Telephone Encounter (Signed)
-----   Message from Donnamae Jude, MD sent at 09/17/2016 12:04 PM EDT ----- Biopsy is normal--awaiting ultrasound results

## 2016-09-17 NOTE — Telephone Encounter (Signed)
Called patient and informed her of results. Patient verbalized understanding and had no questions. 

## 2016-09-18 LAB — CYTOLOGY - PAP
Diagnosis: NEGATIVE
HPV: NOT DETECTED

## 2016-10-01 ENCOUNTER — Ambulatory Visit (HOSPITAL_COMMUNITY): Payer: No Typology Code available for payment source

## 2016-10-29 ENCOUNTER — Emergency Department (HOSPITAL_COMMUNITY)
Admission: EM | Admit: 2016-10-29 | Discharge: 2016-10-29 | Disposition: A | Discharge status: Home / Self Care | Payer: Medicaid Other | Attending: Emergency Medicine | Admitting: Emergency Medicine

## 2016-10-29 ENCOUNTER — Emergency Department (HOSPITAL_COMMUNITY): Payer: Medicaid Other

## 2016-10-29 DIAGNOSIS — S8002XA Contusion of left knee, initial encounter: Secondary | ICD-10-CM | POA: Diagnosis not present

## 2016-10-29 DIAGNOSIS — Z7984 Long term (current) use of oral hypoglycemic drugs: Secondary | ICD-10-CM | POA: Diagnosis not present

## 2016-10-29 DIAGNOSIS — Y9389 Activity, other specified: Secondary | ICD-10-CM | POA: Insufficient documentation

## 2016-10-29 DIAGNOSIS — E119 Type 2 diabetes mellitus without complications: Secondary | ICD-10-CM | POA: Insufficient documentation

## 2016-10-29 DIAGNOSIS — Z79899 Other long term (current) drug therapy: Secondary | ICD-10-CM | POA: Diagnosis not present

## 2016-10-29 DIAGNOSIS — Y9289 Other specified places as the place of occurrence of the external cause: Secondary | ICD-10-CM | POA: Insufficient documentation

## 2016-10-29 DIAGNOSIS — J45909 Unspecified asthma, uncomplicated: Secondary | ICD-10-CM | POA: Insufficient documentation

## 2016-10-29 DIAGNOSIS — M25512 Pain in left shoulder: Secondary | ICD-10-CM

## 2016-10-29 DIAGNOSIS — I10 Essential (primary) hypertension: Secondary | ICD-10-CM | POA: Diagnosis not present

## 2016-10-29 DIAGNOSIS — S8992XA Unspecified injury of left lower leg, initial encounter: Secondary | ICD-10-CM | POA: Diagnosis present

## 2016-10-29 DIAGNOSIS — W208XXA Other cause of strike by thrown, projected or falling object, initial encounter: Secondary | ICD-10-CM | POA: Insufficient documentation

## 2016-10-29 DIAGNOSIS — Z853 Personal history of malignant neoplasm of breast: Secondary | ICD-10-CM | POA: Insufficient documentation

## 2016-10-29 DIAGNOSIS — Y990 Civilian activity done for income or pay: Secondary | ICD-10-CM | POA: Diagnosis not present

## 2016-10-29 MED ORDER — CYCLOBENZAPRINE HCL 10 MG PO TABS: 10.0000 mg | ORAL_TABLET | Freq: Two times a day (BID) | ORAL | 0 refills | Status: DC | PRN

## 2016-10-29 MED ORDER — DICLOFENAC SODIUM 50 MG PO TBEC: 50.0000 mg | DELAYED_RELEASE_TABLET | Freq: Two times a day (BID) | ORAL | 0 refills | Status: DC

## 2016-10-29 NOTE — ED Provider Notes (Signed)
[facility] DEPT Provider Note   CSN: [id number] Arrival date & time: 10/29/16  0915     History   Chief Complaint Chief Complaint  Patient presents with  . Shoulder Injury  . Knee Injury    HPI Sheryl Cox is a 46 y.o. female who presents to the ED with left  shoulder and knee pain s/p injury at work. Patient reports that she was lifting boxes that weighed about 50 pounds and one fell on the patient's left shoulder causing her to fall onto her left knee. She has been taking Aleve and putting warm compresses on the areas without relief. The pain seems to be getting worse rather than better. Patient denies head injury or LOC.   The history is provided by the patient. No language interpreter was used.  Shoulder Injury  This is a new problem. The current episode started more than 2 days ago. The problem has been gradually worsening. Pertinent negatives include no abdominal pain, no headaches and no shortness of breath. Exacerbated by: movement. She has tried a warm compress for the symptoms.  Knee Pain   This is a new problem. The current episode started more than 2 days ago. The problem occurs constantly. The pain is present in the left knee. The pain is moderate.    Past Medical History:  Diagnosis Date  . Anemia    history of blood transfusion-no abnormal reaction noted  . Arthritis   . Asthma    Albuterol as needed  . 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 Diabetes mellitus without complication ([facility])    takes Metformin daily  . History of migraine    several yrs ago  . Hypertension    takes Tribenzor daily  . Joint pain     Patient Active Problem List   Diagnosis Date Noted  . Anemia 06/05/2013  . Menorrhagia 06/05/2013  . 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 Breast cancer of upper-outer quadrant of right female breast ([facility]) 11/24/2012  . Nabothian cyst 12/07/2011  . Cervical mass 11/20/2011  . Fibroid uterus  11/20/2011    Past Surgical History:  Procedure Laterality Date  . BIOPSY BREAST Left 12/05/12   fibroadenoma  . BREAST LUMPECTOMY     2014  . BREAST SURGERY Right   . CESAREAN SECTION  01/05/2009  . CHOLECYSTECTOMY N/A 01/31/2016   Procedure: LAPAROSCOPIC CHOLECYSTECTOMY;  Surgeon: Erroll Luna, MD;  Location: [facility];  Service: General;  Laterality: N/A;  . DILITATION & CURRETTAGE/HYSTROSCOPY WITH HYDROTHERMAL ABLATION N/A 08/12/2013   Procedure: DILATATION & CURETTAGE/HYSTEROSCOPY WITH HYDROTHERMAL ABLATION;  Surgeon: Frederico Hamman, MD;  Location: [facility];  Service: Gynecology;  Laterality: N/A;    OB History    Gravida Para Term Preterm AB Living   1 1 1  0 0 1   SAB TAB Ectopic Multiple Live Births   0 0 0 0 1       Home Medications    Prior to Admission medications   Medication Sig Start Date End Date Taking? Authorizing Provider  albuterol (PROAIR HFA) 108 (90 BASE) MCG/ACT inhaler Inhale 1 puff into the lungs every 6 (six) hours as needed for shortness of breath.     [provider]  cyclobenzaprine (FLEXERIL) 10 MG tablet Take 1 tablet (10 mg total) by mouth 2 (two) times daily as needed for muscle spasms. 10/29/16   Ashley Murrain, NP  diclofenac (VOLTAREN) 50 MG EC tablet Take 1 tablet (50  mg total) by mouth 2 (two) times daily. 10/29/16   Ashley Murrain, NP  ibuprofen (ADVIL,MOTRIN) 600 MG tablet Take 1 tablet (600 mg total) by mouth every 6 (six) hours as needed. 11/05/13   Seabron Spates, CNM  metFORMIN (GLUCOPHAGE) 500 MG tablet Take 500 mg by mouth daily. 12/09/15   [provider]  Multiple Vitamins-Minerals (MULTIVITAMIN PO) Take 1 tablet by mouth daily.    [provider]  Olmesartan-Amlodipine-HCTZ (TRIBENZOR) 20-5-12.5 MG TABS Take 1 tablet by mouth every morning. 07/02/12   Nat Christen, MD  tamoxifen (NOLVADEX) 20 MG tablet TAKE 1 TABLET BY MOUTH EVERY DAY 03/23/15   Nicholas Lose, MD    Family History Family History  Problem  Relation Age of Onset  . Hypertension Mother   . Diabetes Mother   . Breast cancer Paternal Grandmother        dx in her 29s  . Prostate cancer Paternal Grandfather   . Prostate cancer Father 51  . Prostate cancer Paternal Uncle   . Breast cancer Other        paternal grandmother's sister  . Anesthesia problems Neg Hx     Social History Social History  Substance Use Topics  . Smoking status: Never Smoker  . Smokeless tobacco: Never Used  . Alcohol use 0.6 oz/week    1 Cans of beer per week     Comment: occa     Allergies   Shrimp [shellfish allergy] and Shellfish-derived products   Review of Systems Review of Systems  Constitutional: Negative for chills and fever.  HENT: Negative.   Eyes: Negative for visual disturbance.  Respiratory: Negative for cough and shortness of breath.   Gastrointestinal: Negative for abdominal pain, diarrhea, nausea and vomiting.  Genitourinary: Negative for dysuria, frequency and urgency.       No loss of control of bladder or bowels.  Musculoskeletal: Positive for arthralgias. Negative for neck pain.       Left shoulder, left knee  Skin: Positive for color change and wound.       Bruising to the left lower leg  Neurological: Negative for syncope and headaches.  Psychiatric/Behavioral: The patient is not nervous/anxious.      Physical Exam Updated Vital Signs BP (!) 179/109 (BP Location: Right Arm)   Pulse 92   Temp 98.2 F (36.8 C) (Oral)   Resp 16   Ht 5\' 3"  (1.6 m)   Wt 103.9 kg (229 lb)   LMP 10/13/2016 (Approximate)   SpO2 97%   BMI 40.57 kg/m   Physical Exam  Constitutional: She appears well-developed and well-nourished. No distress.  HENT:  Head: Normocephalic and atraumatic.  Eyes: EOM are normal.  Neck: Neck supple.  Cardiovascular: Normal rate.   Pulmonary/Chest: Effort normal.  Abdominal: Soft. There is no tenderness.  Musculoskeletal:       Left shoulder: She exhibits tenderness and spasm. She exhibits  normal range of motion, no crepitus, no deformity, no laceration and normal pulse.       Left knee: She exhibits swelling. She exhibits normal range of motion, no ecchymosis, no deformity, no erythema, normal alignment and normal patellar mobility. Tenderness found.       Legs: Area of ecchymosis noted to the anterior aspect of the left lower leg. Pedal pulses 2+.  Neurological: She is alert.  Skin: Skin is warm and dry.  Psychiatric: She has a normal mood and affect. Her behavior is normal.  Nursing note and vitals reviewed.  ED Treatments / Results  Labs (all labs ordered are listed, but only abnormal results are displayed) Labs Reviewed - No data to display  Radiology Dg Shoulder Left  Result Date: 10/29/2016 CLINICAL DATA:  While lifting boxes onto a truck last Thursday the patient had onset of left shoulder pain. EXAM: LEFT SHOULDER - 2+ VIEW COMPARISON:  Left shoulder series of April 06, 2015 FINDINGS: The bones are subjectively adequately mineralized. There is no acute or healing fracture. There is a small subacromial spur laterally. The soft tissues are unremarkable. IMPRESSION: There is no acute bony abnormality of the left shoulder. There is mild subacromial spurring. Electronically Signed   By: David  Martinique M.D.   On: 10/29/2016 10:01   Dg Knee Complete 4 Views Left  Result Date: 10/29/2016 CLINICAL DATA:  46 year old female with LFTs knee pain after trauma. EXAM: LEFT KNEE - COMPLETE 4+ VIEW COMPARISON:  None. FINDINGS: No evidence of fracture, dislocation, or joint effusion. There are mild degenerative changes with joint space narrowing and osteophytosis. Focal bone abnormality. Soft tissues are unremarkable. IMPRESSION: 1. No fracture, dislocation or significant joint effusion. 2. Mild degenerative changes. Electronically Signed   By: Kristopher Oppenheim M.D.   On: 10/29/2016 10:02    Procedures Procedures (including critical care time)  Medications Ordered in  ED Medications - No data to display   Initial Impression / Assessment and Plan / ED Course  I have reviewed the triage vital signs and the nursing notes.  46 y.o. female with left should and left knee pain s/p injury at work last week stable for d/c without focal neuro deficits. Patient given knee brace and Rx for muscle relaxant and NSAIDS. She will f/u with ortho if symptoms persist. Return precautions discussed.   Final Clinical Impressions(s) / ED Diagnoses   Final diagnoses:  Contusion of left knee, initial encounter  Acute pain of left shoulder    New Prescriptions New Prescriptions   CYCLOBENZAPRINE (FLEXERIL) 10 MG TABLET    Take 1 tablet (10 mg total) by mouth 2 (two) times daily as needed for muscle spasms.   DICLOFENAC (VOLTAREN) 50 MG EC TABLET    Take 1 tablet (50 mg total) by mouth 2 (two) times daily.     Debroah Baller [place], [place] 10/29/16 [place]    Fatima Blank, MD 10/29/16 1655

## 2016-10-29 NOTE — ED Triage Notes (Signed)
Pt states that she was lifting boxes at work and knocked her L knee into the truck and hurt her L shoulder. Alert and oriented.

## 2016-10-29 NOTE — Discharge Instructions (Signed)
Your x-rays today show no fracture or dislocation. If you continue to have pain you will need to see the orthopedic doctor. X-rays do not show injuries to ligaments such as rotator cuff injury of the shoulder.   Do not drive or work while taking the muscle relaxant as it will make you sleepy.

## 2016-10-29 NOTE — ED Notes (Signed)
ED Provider at bedside. 

## 2016-11-20 ENCOUNTER — Emergency Department (HOSPITAL_COMMUNITY)
Admission: EM | Admit: 2016-11-20 | Discharge: 2016-11-20 | Disposition: A | Discharge status: Home / Self Care | Payer: Medicaid Other | Attending: Emergency Medicine | Admitting: Emergency Medicine

## 2016-11-20 ENCOUNTER — Encounter (HOSPITAL_COMMUNITY): Payer: Self-pay

## 2016-11-20 DIAGNOSIS — G8929 Other chronic pain: Secondary | ICD-10-CM | POA: Diagnosis not present

## 2016-11-20 DIAGNOSIS — M25571 Pain in right ankle and joints of right foot: Secondary | ICD-10-CM | POA: Diagnosis not present

## 2016-11-20 DIAGNOSIS — E119 Type 2 diabetes mellitus without complications: Secondary | ICD-10-CM | POA: Diagnosis not present

## 2016-11-20 DIAGNOSIS — J45909 Unspecified asthma, uncomplicated: Secondary | ICD-10-CM | POA: Insufficient documentation

## 2016-11-20 DIAGNOSIS — Z79899 Other long term (current) drug therapy: Secondary | ICD-10-CM | POA: Diagnosis not present

## 2016-11-20 DIAGNOSIS — D649 Anemia, unspecified: Secondary | ICD-10-CM | POA: Diagnosis not present

## 2016-11-20 DIAGNOSIS — Z7984 Long term (current) use of oral hypoglycemic drugs: Secondary | ICD-10-CM | POA: Insufficient documentation

## 2016-11-20 DIAGNOSIS — I10 Essential (primary) hypertension: Secondary | ICD-10-CM | POA: Insufficient documentation

## 2016-11-20 DIAGNOSIS — M79671 Pain in right foot: Secondary | ICD-10-CM

## 2016-11-20 DIAGNOSIS — M79672 Pain in left foot: Secondary | ICD-10-CM

## 2016-11-20 DIAGNOSIS — M25572 Pain in left ankle and joints of left foot: Secondary | ICD-10-CM | POA: Diagnosis present

## 2016-11-20 MED ORDER — IBUPROFEN 600 MG PO TABS: 600.0000 mg | ORAL_TABLET | Freq: Three times a day (TID) | ORAL | 0 refills | Status: DC

## 2016-11-20 NOTE — ED Triage Notes (Signed)
Pt reports she has bilateral chronic foot pain. He PCP died and she has been unable to get her steroid injections. Scheduled for 9/17. Pt reports standing on feet at work.

## 2016-11-20 NOTE — Discharge Instructions (Signed)
Take ibuprofen three times a day with meals. Do not take other anti-inflammatories at the same time (Advil, Motrin, naproxen, Aleve). You may take Tylenol as needed for further pain control. Use inserts near shoes to help with symptom control. Whenever possible, take breaks and elevate your feet. You may use compression socks to help with foot swelling. Make sure your shoes are not too tight. Follow-up with your primary care doctor for further evaluation and management of your feet pain. Return to the emergency room if you lose sensation in her feet, are unable to move your feet, or your feet change colors.

## 2016-11-20 NOTE — ED Provider Notes (Signed)
[facility] DEPT Provider Note   CSN: [id number] Arrival date & time: 11/20/16  1611     History   Chief Complaint Chief Complaint  Patient presents with  . Foot Pain    HPI Sheryl Cox is a 46 y.o. female presenting with bilateral foot pain.  Patient states she has a history of bilateral foot pain. It was improved when her primary care doctor was giving her steroid injections. She recently started a new job in which she is standing on her feet for 12 hours wearing steel toe boots. Since then, she's had worsening pain in her bilateral heels. Pain is worse when she stands up, and improved at rest. She has taken 2 doses of ibuprofen and 1 dose of Tylenol since the pain started last week. She reports some associated bilateral foot swelling. She denies numbness or tingling. She denies falls, trauma, or injury. She denies pain radiating up her legs. She denies pain or swelling in her calves. Patient is ambulatory. Patient has a history of diabetes.  HPI  Past Medical History:  Diagnosis Date  . Anemia    history of blood transfusion-no abnormal reaction noted  . Arthritis   . Asthma    Albuterol as needed  . 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 Diabetes mellitus without complication ([facility])    takes Metformin daily  . History of migraine    several yrs ago  . Hypertension    takes Tribenzor daily  . Joint pain     Patient Active Problem List   Diagnosis Date Noted  . Anemia 06/05/2013  . Menorrhagia 06/05/2013  . 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 Breast cancer of upper-outer quadrant of right female breast ([facility]) 11/24/2012  . Nabothian cyst 12/07/2011  . Cervical mass 11/20/2011  . Fibroid uterus 11/20/2011    Past Surgical History:  Procedure Laterality Date  . BIOPSY BREAST Left 12/05/12   fibroadenoma  . BREAST LUMPECTOMY     2014  . BREAST SURGERY Right   . CESAREAN SECTION  01/05/2009  . CHOLECYSTECTOMY N/A  01/31/2016   Procedure: LAPAROSCOPIC CHOLECYSTECTOMY;  Surgeon: Erroll Luna, MD;  Location: [facility];  Service: General;  Laterality: N/A;  . DILITATION & CURRETTAGE/HYSTROSCOPY WITH HYDROTHERMAL ABLATION N/A 08/12/2013   Procedure: DILATATION & CURETTAGE/HYSTEROSCOPY WITH HYDROTHERMAL ABLATION;  Surgeon: Frederico Hamman, MD;  Location: [facility];  Service: Gynecology;  Laterality: N/A;    OB History    Gravida Para Term Preterm AB Living   1 1 1  0 0 1   SAB TAB Ectopic Multiple Live Births   0 0 0 0 1       Home Medications    Prior to Admission medications   Medication Sig Start Date End Date Taking? Authorizing Provider  albuterol (PROAIR HFA) 108 (90 BASE) MCG/ACT inhaler Inhale 1 puff into the lungs every 6 (six) hours as needed for shortness of breath.     [provider]  cyclobenzaprine (FLEXERIL) 10 MG tablet Take 1 tablet (10 mg total) by mouth 2 (two) times daily as needed for muscle spasms. 10/29/16   Ashley Murrain, NP  diclofenac (VOLTAREN) 50 MG EC tablet Take 1 tablet (50 mg total) by mouth 2 (two) times daily. 10/29/16   Ashley Murrain, NP  ibuprofen (ADVIL,MOTRIN) 600 MG tablet Take 1 tablet (600 mg total) by mouth every 8 (eight) hours. 11/20/16   Jordi Kamm, PA-C  metFORMIN (GLUCOPHAGE) 500 MG tablet  Take 500 mg by mouth daily. 12/09/15   [provider]  Multiple Vitamins-Minerals (MULTIVITAMIN PO) Take 1 tablet by mouth daily.    [provider]  Olmesartan-Amlodipine-HCTZ (TRIBENZOR) 20-5-12.5 MG TABS Take 1 tablet by mouth every morning. 07/02/12   Nat Christen, MD  tamoxifen (NOLVADEX) 20 MG tablet TAKE 1 TABLET BY MOUTH EVERY DAY 03/23/15   Nicholas Lose, MD    Family History Family History  Problem Relation Age of Onset  . Hypertension Mother   . Diabetes Mother   . Breast cancer Paternal Grandmother        dx in her 75s  . Prostate cancer Paternal Grandfather   . Prostate cancer Father 57  . Prostate cancer Paternal Uncle   .  Breast cancer Other        paternal grandmother's sister  . Anesthesia problems Neg Hx     Social History Social History  Substance Use Topics  . Smoking status: Never Smoker  . Smokeless tobacco: Never Used  . Alcohol use 0.6 oz/week    1 Cans of beer per week     Comment: occa     Allergies   Shrimp [shellfish allergy] and Shellfish-derived products   Review of Systems Review of Systems  Musculoskeletal: Positive for arthralgias.  Skin: Negative for wound.  Neurological: Negative for numbness.     Physical Exam Updated Vital Signs BP 130/84   Pulse (!) 108   Temp 98.6 F (37 C) (Oral)   Resp 17   LMP 11/13/2016 (Exact Date)   SpO2 97%   Physical Exam  Constitutional: She is oriented to person, place, and time. She appears well-developed and well-nourished. No distress.  HENT:  Head: Normocephalic and atraumatic.  Eyes: EOM are normal.  Neck: Normal range of motion.  Pulmonary/Chest: Effort normal.  Abdominal: She exhibits no distension.  Musculoskeletal: Normal range of motion.  No obvious lacerations, contusions, or injury to the feet. No obvious pedal edema. Patient with full active range of motion of ankles knees and toes without pain. Sensation intact bilaterally. Color and warmth equal bilaterally. Pulses intact bilaterally. Strength equal bilaterally. No obvious tenderness to palpation of the heels. No tenderness of the Achilles tendons. No tenderness or swelling of the calves. Compartments soft. Patient is ambulatory without difficulty  Neurological: She is alert and oriented to person, place, and time. She has normal strength. No sensory deficit.  Skin: Skin is warm. No rash noted.  Psychiatric: She has a normal mood and affect.  Nursing note and vitals reviewed.    ED Treatments / Results  Labs (all labs ordered are listed, but only abnormal results are displayed) Labs Reviewed - No data to display  EKG  EKG Interpretation None        Radiology No results found.  Procedures Procedures (including critical care time)  Medications Ordered in ED Medications - No data to display   Initial Impression / Assessment and Plan / ED Course  I have reviewed the triage vital signs and the nursing notes.  Pertinent labs & imaging results that were available during my care of the patient were reviewed by me and considered in my medical decision making (see chart for details).     Patient presenting with recurrence of chronic bilateral foot pain. She has been standing more frequently for work in [employer]. She is supposed be wearing inserts, but was unable to find them. Physical exam reassuring, as patient neurovascularly intact. Doubt injury or DVT.  As patient is diabetic, will not prescribe steroids or steroid injection. Will give scheduled anti-inflammatories and Tylenol as needed. Patient to follow-up with her primary care doctor. Discussed using inserts in her shoes, making sure her shoes are not too tight, use of compression socks, and other conservative treatments. At this time, patient appears safe for discharge. Return precautions given. Patient states she understands and agrees to plan.  Final Clinical Impressions(s) / ED Diagnoses   Final diagnoses:  Pain in both feet    New Prescriptions Discharge Medication List as of 11/20/2016  6:09 PM       Franchot Heidelberg, PA-C 11/20/16 1830    Noemi Chapel, MD 11/21/16 [phone]

## 2016-11-30 ENCOUNTER — Ambulatory Visit (INDEPENDENT_AMBULATORY_CARE_PROVIDER_SITE_OTHER): Payer: Medicaid Other | Admitting: Podiatry

## 2016-11-30 ENCOUNTER — Other Ambulatory Visit: Payer: Self-pay | Admitting: Podiatry

## 2016-11-30 ENCOUNTER — Encounter: Payer: Self-pay | Admitting: Podiatry

## 2016-11-30 ENCOUNTER — Ambulatory Visit (INDEPENDENT_AMBULATORY_CARE_PROVIDER_SITE_OTHER): Payer: Medicaid Other

## 2016-11-30 VITALS — BP 147/98 | HR 94

## 2016-11-30 DIAGNOSIS — R52 Pain, unspecified: Secondary | ICD-10-CM | POA: Diagnosis not present

## 2016-11-30 DIAGNOSIS — M2011 Hallux valgus (acquired), right foot: Secondary | ICD-10-CM

## 2016-11-30 DIAGNOSIS — M722 Plantar fascial fibromatosis: Secondary | ICD-10-CM

## 2016-11-30 DIAGNOSIS — M2141 Flat foot [pes planus] (acquired), right foot: Secondary | ICD-10-CM | POA: Diagnosis not present

## 2016-11-30 DIAGNOSIS — M2012 Hallux valgus (acquired), left foot: Secondary | ICD-10-CM

## 2016-11-30 DIAGNOSIS — M2142 Flat foot [pes planus] (acquired), left foot: Secondary | ICD-10-CM | POA: Diagnosis not present

## 2016-11-30 MED ORDER — MELOXICAM 15 MG PO TABS: 15.0000 mg | ORAL_TABLET | Freq: Every day | ORAL | 0 refills | Status: DC

## 2016-11-30 NOTE — Progress Notes (Signed)
   Subjective:    Patient ID: Sheryl Cox, female    DOB: 11-08-1970, 46 y.o.   MRN: [id number]  HPI This patient presents to the office with chief complaint of pain through both feet.  Patient says pain has been increasing over time.  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  She says the foot gets painful by the end of the day.  She says she has sharp radiating pain through her feet during sleep.    She has no history of trauma or injury.  No self treatment afforded.  She says she had orthoses years ago from  Dr. Blenda Mounts. She says the pain is present through the bottom of both feet and her right foot is more painful.  She presents for evaluation and treatment.   Review of Systems  Eyes: Positive for redness and visual disturbance.  Gastrointestinal:       Bloating  Musculoskeletal: Positive for back pain.       Joint/ muscle pain   Allergic/Immunologic: Positive for food allergies.  Neurological: Positive for numbness.       Objective:   Physical Exam GENERAL APPEARANCE: Alert, conversant. Appropriately groomed. No acute distress.  VASCULAR: Pedal pulses are  palpable at  [facility] and PT bilateral.  Capillary refill time is immediate to all digits,  Normal temperature gradient.  Digital hair growth is present bilateral  NEUROLOGIC: sensation is normal to 5.07 monofilament at 5/5 sites bilateral.  Light touch is intact bilateral, Muscle strength normal.  MUSCULOSKELETAL: acceptable muscle strength, tone and stability bilateral.  Intrinsic muscluature intact bilateral.   Asymptomatic HAV deformity  B/L.   Pes plans foot type.   Flexible pes planus foot type.   Palpable pain noted alomg the course of the plantar fascia both feet.  Pain at the insertion plantar fascia right foot.  Pain at the insertion achilles tendon right foot.  DERMATOLOGIC: skin color, texture, and turgor are within normal limits.  No preulcerative lesions or ulcers  are seen, no interdigital maceration noted.  No  open lesions present.  Digital nails are asymptomatic. No drainage noted.         Assessment & Plan:  Plantar Fascitis  B/L  Pes planus foot type.  HAV  B/L.   initial exam.   X-rays were taken to reveal calcification at the insertion plantar fascia both feet.   Calcification at the insertion achilles tendon right foot.   Mild  HAV  B/L.   Discussed  This condition with this patient.   She remarked she received orthoses years ago from Dr.  Blenda Mounts which helped.  Therefore I dispensed powerstep insoles.  Recommended she return for orthoses from  [place].  Patient is prescribed Mobic.  RTC 3 weeks   Gardiner Barefoot DPM

## 2016-11-30 NOTE — Patient Instructions (Signed)
.  tfc Heel Spur A heel spur is a bony growth that forms on the bottom of your heel bone (calcaneus). Heel spurs are common and do not always cause pain. However, heel spurs often cause inflammation in the strong band of tissue that runs underneath the bone of your foot (plantar fascia). When this happens, you may feel pain on the bottom of your foot, near your heel. What are the causes? The cause of heel spurs is not completely understood. They may be caused by pressure on the heel. Or, they may stem from the muscle attachments (tendons) near the spur pulling on the heel. What increases the risk? You may be at risk for a heel spur if you:  Are older than 40.  Are overweight.  Have wear and tear arthritis (osteoarthritis).  Have plantar fascia inflammation.  What are the signs or symptoms? Some people have heel spurs but no symptoms. If you do have symptoms, they may include:  Pain in the bottom of your heel.  Pain that is worse when you first get out of bed.  Pain that gets worse after walking or standing.  How is this diagnosed? Your health care provider may diagnose a heel spur based on your symptoms and a physical exam. You may also have an X-ray of your foot to check for a bony growth coming from the calcaneus. How is this treated? Treatment aims to relieve the pain from the heel spur. This may include:  Stretching exercises.  Losing weight.  Wearing specific shoes, inserts, or orthotics for comfort and support.  Wearing splints at night to properly position your feet.  Taking over-the-counter medicine to relieve pain.  Being treated with high-intensity sound waves to break up the heel spur (extracorporeal shock wave therapy).  Getting steroid injections in your heel to reduce swelling and ease pain.  Having surgery if your heel spur causes long-term (chronic) pain.  Follow these instructions at home:  Take medicines only as directed by your health care  provider.  Ask your health care provider if you should use ice or cold packs on the painful areas of your heel or foot.  Avoid activities that cause you pain until you recover or as directed by your health care provider.  Stretch before exercising or being physically active.  Wear supportive shoes that fit well as directed by your health care provider. You might need to buy new shoes. Wearing old shoes or shoes that do not fit correctly may not provide the support that you need.  Lose weight if your health care provider thinks you should. This can relieve pressure on your foot that may be causing pain and discomfort. Contact a health care provider if:  Your pain continues or gets worse. This information is not intended to replace advice given to you by your health care provider. Make sure you discuss any questions you have with your health care provider. Document Released: 04/11/2005 Document Revised: 08/11/2015 Document Reviewed: 05/06/2013 Elsevier Interactive Patient Education  [employer].

## 2016-11-30 NOTE — Addendum Note (Signed)
Addended byDeidre Ala, Mariano Doshi L on: 11/30/2016 12:04 PM   Modules accepted: Orders

## 2016-12-03 ENCOUNTER — Ambulatory Visit: Payer: Self-pay | Admitting: Podiatry

## 2016-12-14 ENCOUNTER — Emergency Department (HOSPITAL_COMMUNITY): Payer: Medicaid Other

## 2016-12-14 ENCOUNTER — Emergency Department (HOSPITAL_COMMUNITY)
Admission: EM | Admit: 2016-12-14 | Discharge: 2016-12-14 | Disposition: A | Discharge status: Home / Self Care | Payer: Medicaid Other | Attending: Emergency Medicine | Admitting: Emergency Medicine

## 2016-12-14 ENCOUNTER — Encounter (HOSPITAL_COMMUNITY): Payer: Self-pay | Admitting: *Deleted

## 2016-12-14 DIAGNOSIS — Z791 Long term (current) use of non-steroidal anti-inflammatories (NSAID): Secondary | ICD-10-CM | POA: Insufficient documentation

## 2016-12-14 DIAGNOSIS — J45909 Unspecified asthma, uncomplicated: Secondary | ICD-10-CM | POA: Insufficient documentation

## 2016-12-14 DIAGNOSIS — R079 Chest pain, unspecified: Secondary | ICD-10-CM | POA: Diagnosis present

## 2016-12-14 DIAGNOSIS — I10 Essential (primary) hypertension: Secondary | ICD-10-CM | POA: Insufficient documentation

## 2016-12-14 DIAGNOSIS — E876 Hypokalemia: Secondary | ICD-10-CM | POA: Diagnosis not present

## 2016-12-14 DIAGNOSIS — Z7984 Long term (current) use of oral hypoglycemic drugs: Secondary | ICD-10-CM | POA: Diagnosis not present

## 2016-12-14 DIAGNOSIS — E119 Type 2 diabetes mellitus without complications: Secondary | ICD-10-CM | POA: Insufficient documentation

## 2016-12-14 LAB — BASIC METABOLIC PANEL
Anion gap: 10 (ref 5–15)
BUN: 18 mg/dL (ref 6–20)
CALCIUM: 9.1 mg/dL (ref 8.9–10.3)
CO2: 26 mmol/L (ref 22–32)
CREATININE: 0.85 mg/dL (ref 0.44–1.00)
Chloride: 102 mmol/L (ref 101–111)
GFR calc non Af Amer: >60 mL/min (ref >60)
GLUCOSE: 87 mg/dL (ref 65–99)
Potassium: 3 mmol/L — ABNORMAL LOW (ref 3.5–5.1)
Sodium: 138 mmol/L (ref 135–145)

## 2016-12-14 LAB — I-STAT TROPONIN, ED: Troponin i, poc: 0.01 ng/mL (ref 0.00–0.08)

## 2016-12-14 LAB — TROPONIN I

## 2016-12-14 LAB — CBC
HCT: 35.1 % — ABNORMAL LOW (ref 36.0–46.0)
Hemoglobin: 11 g/dL — ABNORMAL LOW (ref 12.0–15.0)
MCH: 23.8 pg — AB (ref 26.0–34.0)
MCHC: 31.3 g/dL (ref 30.0–36.0)
MCV: 76 fL — ABNORMAL LOW (ref 78.0–100.0)
PLATELETS: 305 10*3/uL (ref 150–400)
RBC: 4.62 MIL/uL (ref 3.87–5.11)
RDW: 15.5 % (ref 11.5–15.5)
WBC: 5.8 10*3/uL (ref 4.0–10.5)

## 2016-12-14 LAB — D-DIMER, QUANTITATIVE: D-Dimer, Quant: 0.34 ug/mL-FEU (ref 0.00–0.50)

## 2016-12-14 MED ORDER — POTASSIUM CHLORIDE CRYS ER 20 MEQ PO TBCR
40.0000 meq | EXTENDED_RELEASE_TABLET | Freq: Once | ORAL | Status: AC
  Administered 2016-12-14: 40 meq via ORAL
  Filled 2016-12-14: qty 2

## 2016-12-14 MED ORDER — IBUPROFEN 800 MG PO TABS
800.0000 mg | ORAL_TABLET | Freq: Once | ORAL | Status: AC
  Administered 2016-12-14: 800 mg via ORAL
  Filled 2016-12-14: qty 1

## 2016-12-14 MED ORDER — ASPIRIN 81 MG PO CHEW
324.0000 mg | CHEWABLE_TABLET | Freq: Once | ORAL | Status: AC
  Administered 2016-12-14: 324 mg via ORAL
  Filled 2016-12-14: qty 4

## 2016-12-14 NOTE — ED Notes (Signed)
Pt taken back by tech at [facility].

## 2016-12-14 NOTE — ED Provider Notes (Signed)
[facility] DEPT Provider Note   CSN: [id number] Arrival date & time: 12/14/16  1721     History   Chief Complaint Chief Complaint  Patient presents with  . Chest Pain    HPI Sheryl Cox is a 46 y.o. female.  The patient is a 46 year old female with a past medical history significant for diabetes, breast cancer, chronic anemia, migraine headaches, hypertension, arthritis, and asthma, who presents to the ED complaining of chest pain.  The patient began experiencing acute-onset chest pain in left chest today at work that is radiating to her left upper extremity and left neck.  The symptoms began suddenly ~1 hour after living heavy boxes.  She denies shortness of breath, cough, and fever.  She also denies a personal and family history of PE/DVT.  She has not taken anything for the pain, which is localized and worsens with inspiration.   The history is provided by the patient and medical records. No language interpreter was used.   Past Medical History:  Diagnosis Date  . Anemia    history of blood transfusion-no abnormal reaction noted  . Arthritis   . Asthma    Albuterol as needed  . 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 Diabetes mellitus without complication ([place])    takes Metformin daily  . History of migraine    several yrs ago  . Hypertension    takes Tribenzor daily  . Joint pain    Patient Active Problem List   Diagnosis Date Noted  . Anemia 06/05/2013  . Menorrhagia 06/05/2013  . 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 Breast cancer of upper-outer quadrant of right female breast ([place]) 11/24/2012  . Nabothian cyst 12/07/2011  . Cervical mass 11/20/2011  . Fibroid uterus 11/20/2011   Past Surgical History:  Procedure Laterality Date  . BIOPSY BREAST Left 12/05/12   fibroadenoma  . BREAST LUMPECTOMY     2014  . BREAST SURGERY Right   . CESAREAN SECTION  01/05/2009  . CHOLECYSTECTOMY N/A 01/31/2016   Procedure:  LAPAROSCOPIC CHOLECYSTECTOMY;  Surgeon: Erroll Luna, MD;  Location: [facility];  Service: General;  Laterality: N/A;  . DILITATION & CURRETTAGE/HYSTROSCOPY WITH HYDROTHERMAL ABLATION N/A 08/12/2013   Procedure: DILATATION & CURETTAGE/HYSTEROSCOPY WITH HYDROTHERMAL ABLATION;  Surgeon: Frederico Hamman, MD;  Location: [facility];  Service: Gynecology;  Laterality: N/A;   OB History    Gravida Para Term Preterm AB Living   1 1 1  0 0 1   SAB TAB Ectopic Multiple Live Births   0 0 0 0 1     Home Medications    Prior to Admission medications   Medication Sig Start Date End Date Taking? Authorizing Provider  albuterol (PROAIR HFA) 108 (90 BASE) MCG/ACT inhaler Inhale 1 puff into the lungs every 6 (six) hours as needed for shortness of breath.     [provider]  cyclobenzaprine (FLEXERIL) 10 MG tablet Take 1 tablet (10 mg total) by mouth 2 (two) times daily as needed for muscle spasms. Patient not taking: Reported on 11/30/2016 10/29/16   Ashley Murrain, NP  diclofenac (VOLTAREN) 50 MG EC tablet Take 1 tablet (50 mg total) by mouth 2 (two) times daily. Patient not taking: Reported on 11/30/2016 10/29/16   Ashley Murrain, NP  ibuprofen (ADVIL,MOTRIN) 600 MG tablet Take 1 tablet (600 mg total) by mouth every 8 (eight) hours. Patient not taking: Reported on 11/30/2016 11/20/16   Caccavale, Sophia, PA-C  meloxicam (  MOBIC) 15 MG tablet Take 1 tablet (15 mg total) by mouth daily. 11/30/16   Gardiner Barefoot, DPM  metFORMIN (GLUCOPHAGE) 500 MG tablet Take 500 mg by mouth daily. 12/09/15   [provider]  Multiple Vitamins-Minerals (MULTIVITAMIN PO) Take 1 tablet by mouth daily.    [provider]  Olmesartan-Amlodipine-HCTZ (TRIBENZOR) 20-5-12.5 MG TABS Take 1 tablet by mouth every morning. 07/02/12   Nat Christen, MD  tamoxifen (NOLVADEX) 20 MG tablet TAKE 1 TABLET BY MOUTH EVERY DAY 03/23/15   Nicholas Lose, MD    Family History Family History  Problem Relation Age of Onset  . Hypertension  Mother   . Diabetes Mother   . Breast cancer Paternal Grandmother        dx in her 25s  . Prostate cancer Paternal Grandfather   . Prostate cancer Father 42  . Prostate cancer Paternal Uncle   . Breast cancer Other        paternal grandmother's sister  . Anesthesia problems Neg Hx     Social History Social History  Substance Use Topics  . Smoking status: Never Smoker  . Smokeless tobacco: Never Used  . Alcohol use 0.6 oz/week    1 Cans of beer per week     Comment: occa   Allergies   Shrimp [shellfish allergy] and Shellfish-derived products  Review of Systems Review of Systems  Constitutional: Negative for chills and fever.  Respiratory: Negative for cough and shortness of breath.   Cardiovascular: Positive for chest pain. Negative for palpitations and leg swelling.  Gastrointestinal: Negative for abdominal pain.  All other systems reviewed and are negative.   Physical Exam Updated Vital Signs BP (!) 139/93   Pulse 93   Temp 99.9 F (37.7 C) (Oral)   Resp (!) 28   Ht 5\' 3"  (1.6 m)   Wt 104.3 kg (230 lb)   SpO2 100%   BMI 40.74 kg/m   Physical Exam  Constitutional: She is oriented to person, place, and time. She appears well-developed and well-nourished. No distress.  HENT:  Head: Normocephalic and atraumatic.  Mouth/Throat: Oropharynx is clear and moist.  Eyes: Conjunctivae and EOM are normal.  Neck: Normal range of motion. Neck supple.  Cardiovascular: Normal rate, regular rhythm, normal heart sounds and intact distal pulses.   No murmur heard. Pulmonary/Chest: Effort normal and breath sounds normal. No respiratory distress. She has no wheezes. She exhibits tenderness (left anterior chest wall).    Abdominal: Soft. There is no tenderness.  Musculoskeletal: She exhibits no edema.  Neurological: She is alert and oriented to person, place, and time.  Skin: Skin is warm and dry. Capillary refill takes less than 2 seconds.  Psychiatric: She has a normal mood  and affect. Her behavior is normal. Judgment and thought content normal.  Nursing note and vitals reviewed.   ED Treatments / Results  Labs (all labs ordered are listed, but only abnormal results are displayed) Labs Reviewed  BASIC METABOLIC PANEL - Abnormal; Notable for the following:       Result Value   Potassium 3.0 (*)    All other components within normal limits  CBC - Abnormal; Notable for the following:    Hemoglobin 11.0 (*)    HCT 35.1 (*)    MCV 76.0 (*)    MCH 23.8 (*)    All other components within normal limits  TROPONIN I   EKG  EKG Interpretation None      Radiology Dg Chest 2 View  Result Date: 12/14/2016 CLINICAL DATA:  Chest pain. EXAM: CHEST  2 VIEW COMPARISON:  Radiograph of May 21, 2015 FINDINGS: The heart size and mediastinal contours are within normal limits. Both lungs are clear. No pneumothorax or pleural effusion is noted. The visualized skeletal structures are unremarkable. IMPRESSION: No active cardiopulmonary disease. Electronically Signed   By: Marijo Conception, M.D.   On: 12/14/2016 18:18   Procedures Procedures (including critical care time)  Medications Ordered in ED Medications - No data to display  Initial Impression / Assessment and Plan / ED Course  I have reviewed the triage vital signs and the nursing notes.  Pertinent labs & imaging results that were available during my care of the patient were reviewed by me and considered in my medical decision making (see chart for details).     Initial differential diagnosis included PE, ACS, musculoskeletal strain/sprain, dissection, pneumonia, and pneumothorax.  Pertinent labs included CBC with microcytic anemia.  BMP with hypokalemia (replaced with po potassium).  Serial troponins negative.  Patient with mild tachycardia; I therefore was unable to apply PERC, however low-risk Wells score.  D-dimer negative, decreasing my suspicion for PE and dissection.  EKG with a HR 95 bpm with possible  prior anteroseptal infarct noted, however no evidence of acute arrhythmia, ischemia, or infarct.  Imaging studies included a CXR with no acute cardiopulmonary abnormalities.  The patient was given 324mg  ASA for chest pain with mild improvement of symptoms upon reassessment.  She was subsequently administered Motrin and instructed to take this every 8 hours for the next 3 days.  Based on the above findings, I suspect the patient is most likely suffering from musculoskeletal chest pain at this time that is reproducible on palpation.  ACS less likely given the absence of EKG changes and normal serial troponins.  Pneumonia unlikely in the absence of fever, leukocytosis or cough.  Dissection less likely given the normal d-dimer and absence of neurologic symptoms.  No pneumothorax evident on chest x-ray.  I discussed the above results with the patient who verbalized understanding.  Return precautions and follow-up plans discussed.  The patient was discharged in stable condition.  Final Clinical Impressions(s) / ED Diagnoses   Final diagnoses:  Chest pain, unspecified type  Hypokalemia   New Prescriptions New Prescriptions   No medications on file     Charisse March, MD 12/15/16 [place], [place], MD 12/16/16 1610

## 2016-12-14 NOTE — Discharge Instructions (Addendum)
Take 800mg  Ibuprofen every 8 hours for the next 3 days, and then as needed.  Please follow-up with your PCP and return to the ED as needed for worsening symptoms.

## 2016-12-14 NOTE — ED Triage Notes (Signed)
The pt is c/o lt upper chest pain  While leaving work today no previous history of cardiac problems

## 2016-12-14 NOTE — ED Notes (Signed)
Patient able to ambulate independently  

## 2016-12-19 ENCOUNTER — Ambulatory Visit: Payer: Medicaid Other | Admitting: Podiatry

## 2017-01-01 ENCOUNTER — Ambulatory Visit (INDEPENDENT_AMBULATORY_CARE_PROVIDER_SITE_OTHER): Payer: Medicaid Other | Admitting: Podiatry

## 2017-01-01 ENCOUNTER — Encounter: Payer: Self-pay | Admitting: Podiatry

## 2017-01-01 DIAGNOSIS — M722 Plantar fascial fibromatosis: Secondary | ICD-10-CM | POA: Diagnosis not present

## 2017-01-01 NOTE — Patient Instructions (Signed)

## 2017-01-03 NOTE — Progress Notes (Signed)
Subjective: Sheryl Cox presents to the office today for follow-up evaluation of bilateral heel pain. She states that she is still having pain to the areas, the bottom of the heel. She states the meloxicam did not help. She's had no other significant treatment. She did have plantar fasciitis several years ago and had steroid injections which did help. She states the power step inserts have not been helping either. She denies numbness or tingling. The pain does not wake her up at night. No other complaints at this time. No acute changes since last appointment. They deny any systemic complaints such as fevers, chills, nausea, vomiting.  Objective: General: AAO x3, NAD  Dermatological: Skin is warm, dry and supple bilateral. Nails x 10 are well manicured; remaining integument appears unremarkable at this time. There are no open sores, no preulcerative lesions, no rash or signs of infection present.  Vascular: Dorsalis Pedis artery and Posterior Tibial artery pedal pulses are 2/4 bilateral with immedate capillary fill time.  There is no pain with calf compression, swelling, warmth, erythema.   Neruologic: Grossly intact via light touch bilateral. Negative tinel sign b/l.   Musculoskeletal: There is continue tenderness palpation along the plantar medial tubercle of the calcaneus at the insertion of the plantar fascia on the left and right foot. There is no pain along the course of the plantar fascia within the arch of the foot. Plantar fascia appears to be intact bilaterally. There is no pain with lateral compression of the calcaneus and there is no pain with vibratory sensation. There is no pain along the course or insertion of the Achilles tendon. There are no other areas of tenderness to bilateral lower extremities. No gross boney pedal deformities bilateral. No pain, crepitus, or limitation noted with foot and ankle range of motion bilateral. Muscular strength 5/5 in all groups tested  bilateral.  Gait: Unassisted, Nonantalgic.   Assessment: Presents for follow-up evaluation for heel pain, likely plantar fasciitis   Plan: -Treatment options discussed including all alternatives, risks, and complications -Patient elects to proceed with steroid injection into the left and right heel. Under sterile skin preparation, a total of 2.5cc of kenalog 10, 0.5% Marcaine plain, and 2% lidocaine plain were infiltrated into the symptomatic area without complication. A band-aid was applied. Patient tolerated the injection well without complication. Post-injection care with discussed with the patient. Discussed with the patient to ice the area over the next couple of days to help prevent a steroid flare.  -Continue mobic -Ice and stretching exercises on a daily basis. -Continue supportive shoe gear. Continue powersteps. Due to cost she did not get CMO but may need to revisit that in the near future if symptoms continue. This helped before.  -Follow-up in 3 weeks or sooner if any problems arise. In the meantime, encouraged to call the office with any questions, concerns, change in symptoms.   Celesta Gentile, DPM

## 2017-01-22 ENCOUNTER — Ambulatory Visit: Payer: Medicaid Other | Admitting: Podiatry

## 2017-03-13 ENCOUNTER — Other Ambulatory Visit: Payer: Self-pay | Admitting: Hematology and Oncology

## 2017-04-15 ENCOUNTER — Emergency Department (HOSPITAL_COMMUNITY)
Admission: EM | Admit: 2017-04-15 | Discharge: 2017-04-15 | Disposition: A | Discharge status: Home / Self Care | Payer: Medicaid Other | Attending: Emergency Medicine | Admitting: Emergency Medicine

## 2017-04-15 ENCOUNTER — Encounter (HOSPITAL_COMMUNITY): Payer: Self-pay

## 2017-04-15 ENCOUNTER — Other Ambulatory Visit: Payer: Self-pay

## 2017-04-15 ENCOUNTER — Emergency Department (HOSPITAL_COMMUNITY): Payer: Medicaid Other

## 2017-04-15 DIAGNOSIS — E119 Type 2 diabetes mellitus without complications: Secondary | ICD-10-CM | POA: Diagnosis not present

## 2017-04-15 DIAGNOSIS — R1030 Lower abdominal pain, unspecified: Secondary | ICD-10-CM

## 2017-04-15 DIAGNOSIS — J45909 Unspecified asthma, uncomplicated: Secondary | ICD-10-CM | POA: Insufficient documentation

## 2017-04-15 DIAGNOSIS — Z79899 Other long term (current) drug therapy: Secondary | ICD-10-CM | POA: Insufficient documentation

## 2017-04-15 DIAGNOSIS — I10 Essential (primary) hypertension: Secondary | ICD-10-CM | POA: Diagnosis not present

## 2017-04-15 DIAGNOSIS — Z853 Personal history of malignant neoplasm of breast: Secondary | ICD-10-CM | POA: Insufficient documentation

## 2017-04-15 DIAGNOSIS — N83202 Unspecified ovarian cyst, left side: Secondary | ICD-10-CM | POA: Insufficient documentation

## 2017-04-15 DIAGNOSIS — Z7984 Long term (current) use of oral hypoglycemic drugs: Secondary | ICD-10-CM | POA: Diagnosis not present

## 2017-04-15 LAB — URINALYSIS, ROUTINE W REFLEX MICROSCOPIC
BILIRUBIN URINE: NEGATIVE
GLUCOSE, UA: NEGATIVE mg/dL
KETONES UR: NEGATIVE mg/dL
NITRITE: NEGATIVE
PH: 5 (ref 5.0–8.0)
Protein, ur: 100 mg/dL — AB
Specific Gravity, Urine: 1.031 — ABNORMAL HIGH (ref 1.005–1.030)

## 2017-04-15 LAB — COMPREHENSIVE METABOLIC PANEL
ALBUMIN: 3.9 g/dL (ref 3.5–5.0)
ALK PHOS: 29 U/L — AB (ref 38–126)
ALT: 13 U/L — ABNORMAL LOW (ref 14–54)
ANION GAP: 13 (ref 5–15)
AST: 17 U/L (ref 15–41)
BILIRUBIN TOTAL: 0.5 mg/dL (ref 0.3–1.2)
BUN: 17 mg/dL (ref 6–20)
CALCIUM: 9.4 mg/dL (ref 8.9–10.3)
CO2: 24 mmol/L (ref 22–32)
Chloride: 100 mmol/L — ABNORMAL LOW (ref 101–111)
Creatinine, Ser: 0.77 mg/dL (ref 0.44–1.00)
GFR calc non Af Amer: >60 mL/min (ref >60)
Glucose, Bld: 103 mg/dL — ABNORMAL HIGH (ref 65–99)
POTASSIUM: 3.6 mmol/L (ref 3.5–5.1)
Sodium: 137 mmol/L (ref 135–145)
TOTAL PROTEIN: 7.1 g/dL (ref 6.5–8.1)

## 2017-04-15 LAB — CBC
HEMATOCRIT: 36 % (ref 36.0–46.0)
HEMOGLOBIN: 11.4 g/dL — AB (ref 12.0–15.0)
MCH: 24 pg — ABNORMAL LOW (ref 26.0–34.0)
MCHC: 31.7 g/dL (ref 30.0–36.0)
MCV: 75.8 fL — ABNORMAL LOW (ref 78.0–100.0)
Platelets: 339 10*3/uL (ref 150–400)
RBC: 4.75 MIL/uL (ref 3.87–5.11)
RDW: 15.2 % (ref 11.5–15.5)
WBC: 5 10*3/uL (ref 4.0–10.5)

## 2017-04-15 LAB — WET PREP, GENITAL
Clue Cells Wet Prep HPF POC: NONE SEEN
SPERM: NONE SEEN
Trich, Wet Prep: NONE SEEN
YEAST WET PREP: NONE SEEN

## 2017-04-15 LAB — I-STAT BETA HCG BLOOD, ED (MC, WL, AP ONLY): I-stat hCG, quantitative: <5 m[IU]/mL (ref <5)

## 2017-04-15 LAB — LIPASE, BLOOD: Lipase: 32 U/L (ref 11–51)

## 2017-04-15 MED ORDER — KETOROLAC TROMETHAMINE 30 MG/ML IJ SOLN
15.0000 mg | Freq: Once | INTRAMUSCULAR | Status: AC
  Administered 2017-04-15: 15 mg via INTRAVENOUS
  Filled 2017-04-15: qty 1

## 2017-04-15 MED ORDER — CEPHALEXIN 500 MG PO CAPS: 500.0000 mg | ORAL_CAPSULE | Freq: Four times a day (QID) | ORAL | 0 refills | Status: DC

## 2017-04-15 MED ORDER — ONDANSETRON HCL 4 MG/2ML IJ SOLN
4.0000 mg | Freq: Once | INTRAMUSCULAR | Status: AC
  Administered 2017-04-15: 4 mg via INTRAVENOUS
  Filled 2017-04-15: qty 2

## 2017-04-15 MED ORDER — IOPAMIDOL (ISOVUE-300) INJECTION 61%
INTRAVENOUS | Status: AC
  Administered 2017-04-15: 100 mL
  Filled 2017-04-15: qty 100

## 2017-04-15 MED ORDER — OXYCODONE-ACETAMINOPHEN 5-325 MG PO TABS
1.0000 | ORAL_TABLET | Freq: Once | ORAL | Status: AC
  Administered 2017-04-15: 1 via ORAL
  Filled 2017-04-15: qty 1

## 2017-04-15 MED ORDER — MORPHINE SULFATE (PF) 4 MG/ML IV SOLN
4.0000 mg | Freq: Once | INTRAVENOUS | Status: AC
  Administered 2017-04-15: 4 mg via INTRAVENOUS
  Filled 2017-04-15: qty 1

## 2017-04-15 MED ORDER — TRAMADOL HCL 50 MG PO TABS: 50.0000 mg | ORAL_TABLET | Freq: Four times a day (QID) | ORAL | 0 refills | Status: DC | PRN

## 2017-04-15 MED ORDER — SODIUM CHLORIDE 0.9 % IV BOLUS (SEPSIS)
1000.0000 mL | Freq: Once | INTRAVENOUS | Status: AC
  Administered 2017-04-15: 1000 mL via INTRAVENOUS

## 2017-04-15 NOTE — ED Notes (Signed)
Patient transported to Ultrasound 

## 2017-04-15 NOTE — ED Notes (Signed)
Patient returned from ultrasound.

## 2017-04-15 NOTE — Discharge Instructions (Signed)
It was our pleasure to provide your ER care today - we hope that you feel better.  Your ultrasound was read as showing: IMPRESSION: 1. Left ovary cyst measuring up to 5.8 cm with reticular internal echoes, in the absence of pregnancy, this likely represents a hemorrhagic cyst. 6-12 week follow-up is recommended to ensure resolution. This recommendation follows the consensus statement: Management of Asymptomatic Ovarian and Other Adnexal Cysts Imaged at [place]: Society of Radiologists in [place]. Radiology 2010; [phone]. 2. Small volume of complex fluid surrounding left ovary may represent cyst rupture. 3. Myomatous uterus.  For pain, you may take ultram as need - no driving when taking.   Follow up with your ob/gyn doctor in the coming week - call office tomorrow morning to arrange appointment - have them review your ultrasound at that visit.  For possible urine infection, take antibiotic (keflex) as prescribed.   Return to ER if worse, new symptoms, fevers, severe pain, other concern.   You were given pain medication in the ER - no driving for the next 6 hours.

## 2017-04-15 NOTE — ED Provider Notes (Signed)
[facility] EMERGENCY DEPARTMENT Provider Note   CSN: [id number] Arrival date & time: 04/15/17  0758     History   Chief Complaint Chief Complaint  Patient presents with  . Abdominal Pain  . Emesis    HPI Sheryl Cox is a 47 y.o. female.  HPI 47 year old African-American female with past medical history significant for breast cancer, hypertension that presents to the emergency department today with complaints of generalized to lower abdominal pain.  Patient states acute onset this morning.  Describes the pain as sharp and nonradiating.  Nothing makes better or worse.  Pain is not associated with food.  She has not been taking anything for the pain. Rates the pain a 10 out of 10.  The pain is constant. Reports nausea and one episode of nonbloody bilious emesis today.  Reports normal bowel movements.  Movement was this morning and was normal without any melena or hematochezia.  Denies any associated urinary symptoms, vaginal bleeding, vaginal discharge.  She does report being sexually active but has no concern for STD.  Denies any associated fever.  No history of same.  Denies any abdominal surgeries.  Pt denies any fever, chill, ha, vision changes, lightheadedness, dizziness, congestion, neck pain, cp, sob, cough, urinary symptoms, change in bowel habits, melena, hematochezia, lower extremity paresthesias.  Past Medical History:  Diagnosis Date  . Anemia    history of blood transfusion-no abnormal reaction noted  . Arthritis   . Asthma    Albuterol as needed  . 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 Diabetes mellitus without complication ([place])    takes Metformin daily  . History of migraine    several yrs ago  . Hypertension    takes Tribenzor daily  . Joint pain     Patient Active Problem List   Diagnosis Date Noted  . Plantar fasciitis, bilateral 01/03/2017  . Anemia 06/05/2013  . Menorrhagia 06/05/2013  . DCIS  (ductal carcinoma in situ) 11/26/2012  . Breast cancer of upper-outer quadrant of right female breast ([place]) 11/24/2012  . Nabothian cyst 12/07/2011  . Cervical mass 11/20/2011  . Fibroid uterus 11/20/2011    Past Surgical History:  Procedure Laterality Date  . BIOPSY BREAST Left 12/05/12   fibroadenoma  . BREAST LUMPECTOMY     2014  . BREAST SURGERY Right   . CESAREAN SECTION  01/05/2009  . CHOLECYSTECTOMY N/A 01/31/2016   Procedure: LAPAROSCOPIC CHOLECYSTECTOMY;  Surgeon: Erroll Luna, MD;  Location: [facility];  Service: General;  Laterality: N/A;  . DILITATION & CURRETTAGE/HYSTROSCOPY WITH HYDROTHERMAL ABLATION N/A 08/12/2013   Procedure: DILATATION & CURETTAGE/HYSTEROSCOPY WITH HYDROTHERMAL ABLATION;  Surgeon: Frederico Hamman, MD;  Location: [facility];  Service: Gynecology;  Laterality: N/A;    OB History    Gravida Para Term Preterm AB Living   1 1 1  0 0 1   SAB TAB Ectopic Multiple Live Births   0 0 0 0 1       Home Medications    Prior to Admission medications   Medication Sig Start Date End Date Taking? Authorizing Provider  albuterol (PROAIR HFA) 108 (90 BASE) MCG/ACT inhaler Inhale 1 puff into the lungs every 6 (six) hours as needed for shortness of breath.     [provider]  cyclobenzaprine (FLEXERIL) 10 MG tablet Take 1 tablet (10 mg total) by mouth 2 (two) times daily as needed for muscle spasms. 10/29/16   Ashley Murrain, NP  diclofenac (  VOLTAREN) 50 MG EC tablet Take 1 tablet (50 mg total) by mouth 2 (two) times daily. Patient not taking: Reported on 12/14/2016 10/29/16   Ashley Murrain, NP  ibuprofen (ADVIL,MOTRIN) 600 MG tablet Take 1 tablet (600 mg total) by mouth every 8 (eight) hours. Patient taking differently: Take 600 mg by mouth every 8 (eight) hours as needed (for pain or headaches).  11/20/16   Caccavale, Sophia, PA-C  meloxicam (MOBIC) 15 MG tablet Take 1 tablet (15 mg total) by mouth daily. 11/30/16   Gardiner Barefoot, DPM  metFORMIN (GLUCOPHAGE) 500  MG tablet Take 500 mg by mouth daily. 12/09/15   [provider]  Multiple Vitamins-Minerals (MULTIVITAMIN PO) Take 1 tablet by mouth daily.    [provider]  Olmesartan-Amlodipine-HCTZ (TRIBENZOR) 20-5-12.5 MG TABS Take 1 tablet by mouth every morning. 07/02/12   Nat Christen, MD  tamoxifen (NOLVADEX) 20 MG tablet TAKE 1 TABLET BY MOUTH EVERY DAY 03/14/17   Nicholas Lose, MD    Family History Family History  Problem Relation Age of Onset  . Hypertension Mother   . Diabetes Mother   . Breast cancer Paternal Grandmother        dx in her 57s  . Prostate cancer Paternal Grandfather   . Prostate cancer Father 68  . Prostate cancer Paternal Uncle   . Breast cancer Other        paternal grandmother's sister  . Anesthesia problems Neg Hx     Social History Social History   Tobacco Use  . Smoking status: Never Smoker  . Smokeless tobacco: Never Used  Substance Use Topics  . Alcohol use: Yes    Alcohol/week: 0.6 oz    Types: 1 Cans of beer per week    Comment: occa  . Drug use: No     Allergies   Shellfish-derived products and Shrimp [shellfish allergy]   Review of Systems Review of Systems  Constitutional: Negative for chills and fever.  HENT: Negative for congestion.   Eyes: Negative for visual disturbance.  Respiratory: Negative for cough and shortness of breath.   Cardiovascular: Negative for chest pain.  Gastrointestinal: Positive for nausea and vomiting. Negative for abdominal pain and diarrhea.  Genitourinary: Negative for dysuria, flank pain, frequency, hematuria, urgency, vaginal bleeding and vaginal discharge.  Musculoskeletal: Negative for arthralgias and myalgias.  Skin: Negative for rash.  Neurological: Negative for dizziness, syncope, weakness, light-headedness, numbness and headaches.  Psychiatric/Behavioral: Negative for sleep disturbance. The patient is not nervous/anxious.      Physical Exam Updated Vital Signs BP 131/83   Pulse 91    Temp 99.3 F (37.4 C) (Oral)   Resp 18   SpO2 98%   Physical Exam  Constitutional: She is oriented to person, place, and time. She appears well-developed and well-nourished.  Non-toxic appearance. No distress.  HENT:  Head: Normocephalic and atraumatic.  Nose: Nose normal.  Mouth/Throat: Oropharynx is clear and moist.  Eyes: Conjunctivae are normal. Pupils are equal, round, and reactive to light. Right eye exhibits no discharge. Left eye exhibits no discharge.  Neck: Normal range of motion. Neck supple.  Cardiovascular: Normal rate, regular rhythm, normal heart sounds and intact distal pulses. Exam reveals no gallop and no friction rub.  No murmur heard. Pulmonary/Chest: Effort normal and breath sounds normal. No stridor. No respiratory distress. She has no wheezes. She has no rales. She exhibits no tenderness.  Abdominal: Soft. Bowel sounds are normal. There is generalized tenderness and tenderness in the right lower quadrant, suprapubic  area and left lower quadrant. There is no rigidity, no rebound, no guarding, no CVA tenderness, no tenderness at McBurney's point and negative Murphy's sign.  Genitourinary:  Genitourinary Comments: Chaperone present for exam. No external lesions, swelling, erythema, or rash of the labia. No erythema, discharge, bleeding, or lesions noted in the vaginal vault. No CMT tenderness, bleeding or friability. adnexal tenderness,no  mass or no fullness bilaterally. No inguinal adenopathy or hernia.    Musculoskeletal: Normal range of motion. She exhibits no tenderness.  Lymphadenopathy:    She has no cervical adenopathy.  Neurological: She is alert and oriented to person, place, and time.  Skin: Skin is warm and dry. Capillary refill takes less than 2 seconds.  Psychiatric: Her behavior is normal. Judgment and thought content normal.  Nursing note and vitals reviewed.    ED Treatments / Results  Labs (all labs ordered are listed, but only abnormal results  are displayed) Labs Reviewed  WET PREP, GENITAL - Abnormal; Notable for the following components:      Result Value   WBC, Wet Prep HPF POC MANY (*)    All other components within normal limits  COMPREHENSIVE METABOLIC PANEL - Abnormal; Notable for the following components:   Chloride 100 (*)    Glucose, Bld 103 (*)    ALT 13 (*)    Alkaline Phosphatase 29 (*)    All other components within normal limits  CBC - Abnormal; Notable for the following components:   Hemoglobin 11.4 (*)    MCV 75.8 (*)    MCH 24.0 (*)    All other components within normal limits  URINALYSIS, ROUTINE W REFLEX MICROSCOPIC - Abnormal; Notable for the following components:   APPearance HAZY (*)    Specific Gravity, Urine 1.031 (*)    Hgb urine dipstick SMALL (*)    Protein, ur 100 (*)    Leukocytes, UA TRACE (*)    Bacteria, UA FEW (*)    Squamous Epithelial / LPF 6-30 (*)    All other components within normal limits  LIPASE, BLOOD  I-STAT BETA HCG BLOOD, ED ([facility], [facility], [facility] ONLY)  GC/CHLAMYDIA PROBE AMP ([place]) NOT AT [facility]    EKG  EKG Interpretation None       Radiology Ct Abdomen Pelvis W Contrast  Result Date: 04/15/2017 CLINICAL DATA:  Increasing midline pressure over several days, bladder pressure, h/o cholecystectomy 75cc ISO 300^163mL ISOVUE-300 IOPAMIDOL (ISOVUE-300) INJECTION 61% EXAM: CT ABDOMEN AND PELVIS WITH CONTRAST TECHNIQUE: Multidetector CT imaging of the abdomen and pelvis was performed using the standard protocol following bolus administration of intravenous contrast. CONTRAST:  154mL ISOVUE-300 IOPAMIDOL (ISOVUE-300) INJECTION 61% COMPARISON:  Pelvic ultrasound 09/17/2016, CT abdomen pelvis 12/18/2015 FINDINGS: Lower chest: Lung bases are clear Hepatobiliary: No focal hepatic lesion. Postcholecystectomy. No biliary dilatation. Pancreas: Pancreas is normal. No ductal dilatation. No pancreatic inflammation. Spleen: Normal spleen Adrenals/urinary tract: Adrenal glands and kidneys are  normal. The ureters and bladder normal. Stomach/Bowel: Stomach, small bowel, appendix, and cecum are normal. The colon and rectosigmoid colon are normal. Vascular/Lymphatic: Abdominal aorta is normal caliber. There is no retroperitoneal or periportal lymphadenopathy. No pelvic lymphadenopathy. Reproductive: Uterus is enlarged. There are multiple round lesions of differing density consistent leiomyomas. No significant change. The LEFT ovary is more anterior than on comparison CT measuring 6.5 x 4.5 cm (image 68, series 3). There is high-density material within a rounded portion of the ovary suggesting hemorrhage cyst. The RIGHT ovary is normal measuring 3.5 by 2.6 cm. Other: No  free fluid. Musculoskeletal: No aggressive osseous lesion. IMPRESSION: 1. Interval change in density of the LEFT ovary and mild enlargement is favored a development of a hemorrhagic ovarian cyst. Consider pelvic ultrasound for further evaluation. 2. RIGHT ovary normal. 3. Multiple rounded lesions in the uterus of mixed density consistent leiomyoma. No significant interval change. 4. Normal appendix. 5. Postcholecystectomy. Electronically Signed   By: Suzy Bouchard M.D.   On: 04/15/2017 13:46    Procedures Procedures (including critical care time)  Medications Ordered in ED Medications  sodium chloride 0.9 % bolus 1,000 mL (1,000 mLs Intravenous New Bag/Given 04/15/17 1215)  morphine 4 MG/ML injection 4 mg (4 mg Intravenous Given 04/15/17 1217)  ondansetron (ZOFRAN) injection 4 mg (4 mg Intravenous Given 04/15/17 1217)  iopamidol (ISOVUE-300) 61 % injection (100 mLs  Contrast Given 04/15/17 1311)     Initial Impression / Assessment and Plan / ED Course  I have reviewed the triage vital signs and the nursing notes.  Pertinent labs & imaging results that were available during my care of the patient were reviewed by me and considered in my medical decision making (see chart for details).     Patient presents the ED for  evaluation of lower abdominal pain with associated nausea acute onset today.  History of ovarian cyst in the past.  Patient denies any other associated symptoms including fever, chills, urinary symptoms, vaginal bleeding, vaginal discharge, change in bowel habits.  Patient is overall well-appearing and nontoxic.  Her vital signs are reassuring.  Patient afebrile in the ED.  No tachycardia or hypotension is noted.  On exam patient does have some diffuse lower abdominal tenderness to palpation.  No signs of peritonitis.  No CVA tenderness.  Pelvic exam reveals no cervical motion tenderness or vaginal discharge.  She does have some bilateral adnexal tenderness to palpation.  Lungs are clear to auscultation bilaterally.  Heart regular rate and rhythm.  Lab work has been very reassuring.  No leukocytosis.  Hemoglobin at patient's baseline.  Elect lites are reassuring.  Normal kidney function.  UA with trace leukocytes and few bacteria with significant amount of squamous epithelial cells.  Denies any urinary symptoms will not treat at this time.  Normal lipase.  C and Chlamydia cultures are pending however have low suspicion.  Wet prep reveals WBCs but no other acute abnormalities.  CT scan was obtained of abdomen and pelvis.  IMPRESSION: 1. Interval change in density of the LEFT ovary and mild enlargement is favored a development of a hemorrhagic ovarian cyst. Consider pelvic ultrasound for further evaluation. 2. RIGHT ovary normal. 3. Multiple rounded lesions in the uterus of mixed density consistent leiomyoma. No significant interval change. 4. Normal appendix. 5. Postcholecystectomy  Given patient's left adnexal tenderness and CT findings will proceed with ultrasound to rule any ovarian torsion and to assess for hemorrhagic cyst.  Patient is pain is been managed in the ED.  She continues to have no signs of peritonitis.  Ultrasound results are pending at this time.  Low suspicion for any PID given no  cervical motion tenderness.  UA does not show any signs of infection we will not treat at this time.  Pain likely consistent with a hemorrhagic cyst that can be followed up with GYN.  Care handoff to Vivian. Pt has pending at this time [place] to assess for ovarian torsion and hemorrhagic cyst..  Disposition likely homepending lab and test results.  If there is no acute findings on the ultrasound patient can  be sent home with pain medication and follow-up with her GYN this week.  Care dicussed and plan agreed upon with oncomingMD. Pt updated on plan of care and is currently hemodynamically stable at this time with normal vs.       Final Clinical Impressions(s) / ED Diagnoses   Final diagnoses:  Cyst of left ovary  Lower abdominal pain    ED Discharge Orders    None       Aaron Edelman 04/15/17 1748    Doristine Devoid, PA-C 04/15/17 1750    Quintella Reichert, MD 04/16/17 [phone]

## 2017-04-15 NOTE — ED Notes (Signed)
Pelvic cart at pt's bedside. 

## 2017-04-15 NOTE — ED Notes (Signed)
Pelvic exam done by Dorothea Ogle - PA and Lavella Lemons - EMT assisted

## 2017-04-15 NOTE — ED Provider Notes (Signed)
Signed out by Leaphart/Reis to check u/s when resulted and d/c to home.   U/s with ovarian cyst, no torsion.   Pain is controlled. Patient indicates can f/u w her gyn, Dr Kennon Rounds, as outpt.  6-30 wbc on ua, pt does note mild dysuria, no fever. Will rx for possible uti.       Lajean Saver, MD 04/15/17 [phone]

## 2017-04-15 NOTE — ED Notes (Signed)
Patient Alert and oriented X4. Stable and ambulatory. Patient verbalized understanding of the discharge instructions.  Patient belongings were taken by the patient.  

## 2017-04-15 NOTE — ED Notes (Signed)
Patient remains in Ultrasound at this time

## 2017-04-15 NOTE — ED Triage Notes (Signed)
Patient complains of generalized abdominal pain with 1 episode of vomiting this am. Denies diarrhea. Patient alert and oriented, NAD

## 2017-04-16 LAB — GC/CHLAMYDIA PROBE AMP (~~LOC~~) NOT AT ARMC
Chlamydia: NEGATIVE
NEISSERIA GONORRHEA: NEGATIVE

## 2017-04-30 ENCOUNTER — Telehealth: Payer: Self-pay

## 2017-04-30 NOTE — Telephone Encounter (Signed)
Pt called the office stating that she is having pain and bleeding and wants to know if she needs to go to the ER or come to the office. Please return call.

## 2017-05-02 ENCOUNTER — Ambulatory Visit: Payer: Medicaid Other | Admitting: Family Medicine

## 2017-05-02 ENCOUNTER — Encounter: Payer: Self-pay | Admitting: Family Medicine

## 2017-05-02 VITALS — BP 143/90 | HR 101 | Wt 227.0 lb

## 2017-05-02 DIAGNOSIS — N920 Excessive and frequent menstruation with regular cycle: Secondary | ICD-10-CM

## 2017-05-02 DIAGNOSIS — D251 Intramural leiomyoma of uterus: Secondary | ICD-10-CM

## 2017-05-02 DIAGNOSIS — D250 Submucous leiomyoma of uterus: Secondary | ICD-10-CM | POA: Diagnosis not present

## 2017-05-02 DIAGNOSIS — N83202 Unspecified ovarian cyst, left side: Secondary | ICD-10-CM

## 2017-05-02 MED ORDER — MEGESTROL ACETATE 40 MG PO TABS: 40.0000 mg | ORAL_TABLET | Freq: Two times a day (BID) | ORAL | 3 refills | Status: DC

## 2017-05-02 NOTE — Progress Notes (Signed)
Pt stated having heavy bleeding(clotting), cramping, and B/lL pain for about 1 week.

## 2017-05-02 NOTE — Progress Notes (Signed)
Subjective:    Patient ID: Sheryl Cox is a 47 y.o. female presenting with No chief complaint on file.  on 05/02/2017  HPI: Patient returns today for discussion around her continued bleeding. Has h/o breast cancer and has been on tamoxifen x 3.5 years. She is s/p HTA with Dr. [name] in 2015, and has h/o fibroids.Her bleeding was initially responsive to the HTA but now her bleeding continues. She is mildly anemic. Also, seen in ED on 1/28 with acute onset abdominal pain and noted to have left ovarian cyst--which appeared to be hemorrhagic and requires f/u. Initially seen with pap and EMB in summer of 2018 and u/s then showed 13 cm uterus with multiple fibroids.Nml pap and EMB. Most recent u/s done 1/28 shows 16 cm size uterus. She has h/o C-section x 1. Uterus was 14-16 wk size on my last exam.  Review of Systems  Constitutional: Negative for chills and fever.  Respiratory: Negative for shortness of breath.   Cardiovascular: Negative for chest pain.  Gastrointestinal: Negative for abdominal pain, nausea and vomiting.  Genitourinary: Negative for dysuria.  Skin: Negative for rash.      Objective:    BP (!) 143/90   Pulse (!) 101   Wt 227 lb (103 kg)   LMP 04/03/2017 (Approximate)   BMI 40.21 kg/m  Physical Exam  Constitutional: She is oriented to person, place, and time. She appears well-developed and well-nourished. No distress.  HENT:  Head: Normocephalic and atraumatic.  Eyes: No scleral icterus.  Neck: Neck supple.  Cardiovascular: Normal rate.  Pulmonary/Chest: Effort normal.  Abdominal: Soft. She exhibits mass (lower abdomen).  Neurological: She is alert and oriented to person, place, and time.  Skin: Skin is warm and dry.  Psychiatric: She has a normal mood and affect.  U/s 04/15/17 Uterus Measurements: 16 x 8.5 x 9.6 cm. Multiple uterine fibroids: Left fundal myometrium 6.5 cm, anterior mid subserosal 4.4 cm, right mid myometrial 6 cm, and additional smaller  fibroma in the lower uterine segments.  Endometrium Thickness: 15 mm.  No focal abnormality visualized.  Right ovary Measurements: 3.6 x 2.5 x 3.0 cm. Normal appearance/no adnexal mass.  Left ovary Measurements: 7.5 x 3.8 x 5.3 cm. Avascular lesion measuring up to 5.8 cm with reticular internal echoes, likely hemorrhagic cysts.  Other findings Small volume of fluid surrounds the mildly complex left ovary.  IMPRESSION: 1. Left ovary cyst measuring up to 5.8 cm with reticular internal echoes, in the absence of pregnancy, this likely represents a hemorrhagic cyst. 6-12 week follow-up is recommended to ensure resolution.  2. Small volume of complex fluid surrounding left ovary may represent cyst rupture. 3. Myomatous uterus.     Assessment & Plan:   Problem List Items Addressed This Visit      Unprioritized   Fibroid uterus    Have discussed all options including TVH--do not think she is a good candidate due to uterine size. Offered her a TAH but she really does not want a large incision. We discussed UFE as an alternative to surgery and she is not truly interested in that either as it may not stop her bleeding. I will have her see a robotic surgeon to see if this is an alternative she can have. If not will book for TAH with me.      Relevant Orders   US PELVIS (TRANSABDOMINAL ONLY)   US PELVIS TRANSVANGINAL NON-OB (TV ONLY)   Menorrhagia - Primary     Gave rx for  megace but since her Breast cancer is PR positive, will not have her take this and give TXA instead. Patient called and given these instructions.      Relevant Medications   tranexamic acid (LYSTEDA) 650 MG TABS tablet    Other Visit Diagnoses    Cyst of left ovary       U/S to ensure resolution   Relevant Orders   US PELVIS (TRANSABDOMINAL ONLY)   US PELVIS TRANSVANGINAL NON-OB (TV ONLY)     Risks include but are not limited to bleeding, infection, injury to surrounding structures, including bowel, bladder  and ureters, blood clots, and death.  Likelihood of success is high.  Total face-to-face time with patient: 25 minutes. Over 50% of encounter was spent on counseling and coordination of care. Return in about 2 weeks (around 05/16/2017) for with Dr. June Leap discuss robotic hysterectomy.  Donnamae Jude 05/03/2017 9:33 AM

## 2017-05-02 NOTE — Patient Instructions (Signed)
Uterine Artery Embolization for Fibroids Uterine artery embolization is a nonsurgical treatment to shrink fibroids. A thin plastic tube (catheter) is used to inject material that blocks off the blood supply to the fibroid, which causes the fibroid to shrink. Tell a health care provider about:  Any allergies you have.  All medicines you are taking, including vitamins, herbs, eye drops, creams, and over-the-counter medicines.  Any problems you or family members have had with anesthetic medicines.  Any blood disorders you have.  Any surgeries you have had.  Any medical conditions you have. What are the risks?  Injury to the uterus from decreased blood supply  Infection.  Blood infection (septicemia).  Lack of menstrual periods (amenorrhea).  Death of tissue cells (necrosis) around your bladder or vulva.  Development of a hole between organs or from an organ to the surface of your skin (fistula).  Blood clot in the legs (deep vein thrombosis) or lung (pulmonary embolus). What happens before the procedure?  Ask your health care provider about changing or stopping your regular medicines.  Do not take aspirin or blood thinners (anticoagulants) for 1 week before the surgery or as directed by your health care provider.  Do not eat or drink anything for 8 hours before the surgery or as directed by your health care provider.  Empty your bladder before the procedure begins. What happens during the procedure?  An IV tube will be placed into one of your veins. This will be used to give you a sedative and pain medication (conscious sedation).  You will be given a medicine that numbs the area (local anesthetic).  A small cut will be made in your groin. A catheter is then inserted into the main artery of your leg.  The catheter will be guided through the artery to your uterus. A series of images will be taken while dye is injected through the catheter in your groin. X-rays are taken at  the same time. This is done to provide a road map of the blood supply to your uterus and fibroids.  Tiny plastic spheres, about the size of sand grains, will be injected through the catheter. Metal coils may be used to help block the artery. The particles will lodge in tiny branches of the uterine artery that supplies blood to the fibroids.  The procedure is repeated on the artery that supplies the other side of the uterus.  The catheter is then removed and pressure is held to stop any bleeding. No stitches are needed.  A dressing is then placed over the cut (incision). What happens after the procedure?  You will be taken to a recovery area where your progress will be monitored until you are awake, stable, and taking fluids well. If there are no other problems, you will then be moved to a regular hospital room.  You will be observed overnight in the hospital.  You will have cramping that should be controlled with pain medication. This information is not intended to replace advice given to you by your health care provider. Make sure you discuss any questions you have with your health care provider. Document Released: 05/21/2005 Document Revised: 08/11/2015 Document Reviewed: 09/18/2012 Elsevier Interactive Patient Education  2018 [employer]. Hysterectomy Information A hysterectomy is a surgery to remove your uterus. After surgery, you will no longer have periods. Also, you will not be able to get pregnant. Reasons for this surgery  You have bleeding that is not normal and keeps coming back.  You have lasting (  chronic) lower belly (pelvic) pain.  You have a lasting infection.  The lining of your uterus grows outside your uterus.  The lining of your uterus grows in the muscle of your uterus.  Your uterus falls down into your vagina.  You have a growth in your uterus that causes problems.  You have cells that could turn into cancer (precancerous cells).  You have cancer of the  uterus or cervix. Types There are 3 types of hysterectomies. Depending on the type, the surgery will:  Remove the top part of the uterus only.  Remove the uterus and the cervix.  Remove the uterus, cervix, and tissue that holds the uterus in place in the lower belly.  Ways a hysterectomy can be performed There are 5 ways this surgery can be performed.  A cut (incision) is made in the belly (abdomen). The uterus is taken out through the cut.  A cut is made in the vagina. You are not a candidate due to size of uterusThe uterus is taken out through the cut.  Three or four cuts are made in the belly. A surgical device with a camera is put through one of the cuts. The uterus is cut into small pieces. The uterus is taken out through the cuts or the vagina.  Three or four cuts are made in the belly. A surgical device with a camera is put through one of the cuts. The uterus is taken out through the vagina.  Three or four cuts are made in the belly. A surgical device that is controlled by a computer/robotic makes a visual image. The device helps the surgeon control the surgical tools. The uterus is cut into small pieces. The pieces are taken out through the cuts or through the vagina.  What can I expect after the surgery?  You will be given pain medicine.  You will need help at home for 3-5 days after surgery.  You will need to see your doctor in 2-4 weeks after surgery.  You may get hot flashes, have night sweats, and have trouble sleeping.  You may need to have Pap tests in the future if your surgery was related to cancer. Talk to your doctor. It is still good to have regular exams. This information is not intended to replace advice given to you by your health care provider. Make sure you discuss any questions you have with your health care provider. Document Released: 05/28/2011 Document Revised: 08/11/2015 Document Reviewed: 11/10/2012 Elsevier Interactive Patient Education  [employer].

## 2017-05-03 ENCOUNTER — Encounter: Payer: Self-pay | Admitting: Family Medicine

## 2017-05-03 MED ORDER — TRANEXAMIC ACID 650 MG PO TABS: 1300.0000 mg | ORAL_TABLET | Freq: Three times a day (TID) | ORAL | 3 refills | Status: DC

## 2017-05-03 NOTE — Assessment & Plan Note (Addendum)
Have discussed all options including TVH--do not think she is a good candidate due to uterine size. Offered her a TAH but she really does not want a large incision. We discussed UFE as an alternative to surgery and she is not truly interested in that either as it may not stop her bleeding. I will have her see a robotic surgeon to see if this is an alternative she can have. If not will book for TAH with me.

## 2017-05-03 NOTE — Assessment & Plan Note (Addendum)
Gave rx for megace but since her Breast cancer is PR positive, will not have her take this and give TXA instead. Patient called and given these instructions.

## 2017-05-22 ENCOUNTER — Encounter (HOSPITAL_COMMUNITY): Payer: Self-pay

## 2017-05-22 ENCOUNTER — Encounter: Payer: Self-pay | Admitting: Obstetrics & Gynecology

## 2017-05-22 ENCOUNTER — Ambulatory Visit (INDEPENDENT_AMBULATORY_CARE_PROVIDER_SITE_OTHER): Payer: Medicaid Other | Admitting: Obstetrics & Gynecology

## 2017-05-22 VITALS — BP 135/90 | HR 100 | Wt 229.0 lb

## 2017-05-22 DIAGNOSIS — N939 Abnormal uterine and vaginal bleeding, unspecified: Secondary | ICD-10-CM | POA: Diagnosis not present

## 2017-05-22 DIAGNOSIS — R102 Pelvic and perineal pain: Secondary | ICD-10-CM | POA: Diagnosis not present

## 2017-05-22 DIAGNOSIS — D219 Benign neoplasm of connective and other soft tissue, unspecified: Secondary | ICD-10-CM | POA: Diagnosis not present

## 2017-05-22 NOTE — Progress Notes (Signed)
History:  47 y.o. G1P1001 here today for surgical consult. She was prev seen by Dr. Kennon Rounds and eval for symptomatic fibroids causing AUB, pelvic pain and clots. She would like her to be considered for a RATH.   Pt has had a PAP and an Endo Bx both of which were neg.    The pt has a h/o breast CA and is now on Tamoxifen. She reports that she wants to decrease her chance of getting cancer again so would like her ovaries removed as well.   The following portions of the patient's history were reviewed and updated as appropriate: allergies, current medications, past family history, past medical history, past social history, past surgical history and problem list.  Review of Systems:  Pertinent items are noted in HPI.   Objective:  Physical Exam Blood pressure 135/90, pulse 100, weight 229 lb (103.9 kg), last menstrual period 04/03/2017.  CONSTITUTIONAL: Well-developed, well-nourished female in no acute distress.  HENT:  Normocephalic, atraumatic EYES: Conjunctivae and EOM are normal. No scleral icterus.  NECK: Normal range of motion SKIN: Skin is warm and dry. No rash noted. Not diaphoretic.No pallor. Shipman: Alert and oriented to person, place, and time. Normal coordination.  GU: EGBUS: no lesions Vagina: no blood in vault Cervix: no lesion; no mucopurulent d/c Uterus: enlarged; poor decensus Adnexa: no masses; non tender   Labs and Imaging 09/14/2016 Diagnosis Endometrium, biopsy - SCANT ATROPHIC-APPEARING ENDOMETRIUM. - BENIGN ENDOCERVICAL-TYPE MUCOSA. - THERE IS NO EVIDENCE OF HYPERPLASIA OR MALIGNANCY.  09/14/2016 Adequacy Satisfactory for evaluation endocervical/transformation zone component PRESENT.   Diagnosis NEGATIVE FOR INTRAEPITHELIAL LESIONS OR MALIGNANCY.   HPV NOT DETECTED   Comment: Normal Reference Range - NOT Detected    Assessment & Plan:  Uterine fibroids- symptomatic  Patient desires surgical management with RATH with BSO.  Pt understands that the removal  of her ovaries will put her in menopause. She reports that she prefers this over the risk of breast or ovarian cancer.  The risks of surgery were discussed in detail with the patient including but not limited to: bleeding which may require transfusion or reoperation; infection which may require prolonged hospitalization or re-hospitalization and antibiotic therapy; injury to bowel, bladder, ureters and major vessels or other surrounding organs; need for additional procedures including laparotomy; thromboembolic phenomenon, incisional problems and other postoperative or anesthesia complications.  Patient was told that the likelihood that her condition and symptoms will be treated effectively with this surgical management was very high; the postoperative expectations were also discussed in detail. The patient also understands the alternative treatment options which were discussed in full. All questions were answered. She is scheduled for June 18, 2017. Routine preoperative instructions of having nothing to eat or drink after midnight on the day prior to surgery and also coming to the hospital 1 1/2 hours prior to her time of surgery were also emphasized.  She was told she may be called for a preoperative appointment about a week prior to surgery and will be given further preoperative instructions at that visit. Printed patient education handouts about the procedure were given to the patient to review at home.  Total face-to-face time with patient was 25 min.  Greater than 50% was spent in counseling and coordination of care with the patient.    Davidjames Blansett L. Harraway-Smith, M.D., [place]nn June

## 2017-05-22 NOTE — Patient Instructions (Signed)
Hysterectomy Information A hysterectomy is a surgery to remove your uterus. After surgery, you will no longer have periods. Also, you will not be able to get pregnant. Reasons for this surgery  You have bleeding that is not normal and keeps coming back.  You have lasting (chronic) lower belly (pelvic) pain.  You have a lasting infection.  The lining of your uterus grows outside your uterus.  The lining of your uterus grows in the muscle of your uterus.  Your uterus falls down into your vagina.  You have a growth in your uterus that causes problems.  You have cells that could turn into cancer (precancerous cells).  You have cancer of the uterus or cervix. Types There are 3 types of hysterectomies. Depending on the type, the surgery will:  Remove the top part of the uterus only.  Remove the uterus and the cervix.  Remove the uterus, cervix, and tissue that holds the uterus in place in the lower belly.  Ways a hysterectomy can be performed There are 5 ways this surgery can be performed.  A cut (incision) is made in the belly (abdomen). The uterus is taken out through the cut.  A cut is made in the vagina. The uterus is taken out through the cut.  Three or four cuts are made in the belly. A surgical device with a camera is put through one of the cuts. The uterus is cut into small pieces. The uterus is taken out through the cuts or the vagina.  Three or four cuts are made in the belly. A surgical device with a camera is put through one of the cuts. The uterus is taken out through the vagina.  Three or four cuts are made in the belly. A surgical device that is controlled by a computer makes a visual image. The device helps the surgeon control the surgical tools. The uterus is cut into small pieces. The pieces are taken out through the cuts or through the vagina.  What can I expect after the surgery?  You will be given pain medicine.  You will need help at home for 3-5 days  after surgery.  You will need to see your doctor in 2-4 weeks after surgery.  You may get hot flashes, have night sweats, and have trouble sleeping.  You may need to have Pap tests in the future if your surgery was related to cancer. Talk to your doctor. It is still good to have regular exams. This information is not intended to replace advice given to you by your health care provider. Make sure you discuss any questions you have with your health care provider. Document Released: 05/28/2011 Document Revised: 08/11/2015 Document Reviewed: 11/10/2012 Elsevier Interactive Patient Education  2018 [employer] Total Laparoscopic Hysterectomy, Care After Refer to this sheet in the next few weeks. These instructions provide you with information on caring for yourself after your procedure. Your health care provider may also give you more specific instructions. Your treatment has been planned according to current medical practices, but problems sometimes occur. Call your health care provider if you have any problems or questions after your procedure. What can I expect after the procedure?  Pain and bruising at the incision sites. You will be given pain medicine to control it.  Menopausal symptoms such as hot flashes, night sweats, and insomnia if your ovaries were removed.  Sore throat from the breathing tube that was inserted during surgery. Follow these instructions at home:  Only take over-the-counter or   prescription medicines for pain, discomfort, or fever as directed by your health care provider.  Do not take aspirin. It can cause bleeding.  Do not drive when taking pain medicine.  Follow your health care provider's advice regarding diet, exercise, lifting, driving, and general activities.  Resume your usual diet as directed and allowed.  Get plenty of rest and sleep.  Do not douche, use tampons, or have sexual intercourse for at least 6 weeks, or until your health care provider gives  you permission.  Change your bandages (dressings) as directed by your health care provider.  Monitor your temperature and notify your health care provider of a fever.  Take showers instead of baths for 2-3 weeks.  Do not drink alcohol until your health care provider gives you permission.  If you develop constipation, you may take a mild laxative with your health care provider's permission. Bran foods may help with constipation problems. Drinking enough fluids to keep your urine clear or pale yellow may help as well.  Try to have someone home with you for 1-2 weeks to help around the house.  Keep all of your follow-up appointments as directed by your health care provider. Contact a health care provider if:  You have swelling, redness, or increasing pain around your incision sites.  You have pus coming from your incision.  You notice a bad smell coming from your incision.  Your incision breaks open.  You feel dizzy or lightheaded.  You have pain or bleeding when you urinate.  You have persistent diarrhea.  You have persistent nausea and vomiting.  You have abnormal vaginal discharge.  You have a rash.  You have any type of abnormal reaction or develop an allergy to your medicine.  You have poor pain control with your prescribed medicine. Get help right away if:  You have chest pain or shortness of breath.  You have severe abdominal pain that is not relieved with pain medicine.  You have pain or swelling in your legs. This information is not intended to replace advice given to you by your health care provider. Make sure you discuss any questions you have with your health care provider. Document Released: 12/24/2012 Document Revised: 08/11/2015 Document Reviewed: 09/23/2012 Elsevier Interactive Patient Education  2017 [employer]  

## 2017-06-11 NOTE — Patient Instructions (Addendum)
Sheryl Cox  06/11/2017   Your procedure is scheduled on: 06-18-17   Report to [facility] Main  Entrance Report to admitting at 10:59 AM     Call this number if you have problems the morning of surgery [phone]   Remember: Do not eat food or drink liquids :After Midnight.     Take these medicines the morning of surgery with A SIP OF WATER: None DO NOT TAKE ANY DIABETIC MEDICATIONS DAY OF YOUR SURGERY                               You may not have any metal on your body including hair pins and              piercings  Do not wear jewelry, make-up, lotions, powders or perfumes, deodorant             Do not wear nail polish.  Do not shave  48 hours prior to surgery.               Do not bring valuables to the hospital. [place].  Contacts, dentures or bridgework may not be worn into surgery.  Leave suitcase in the car. After surgery it may be brought to your room.                   Please read over the following fact sheets you were given: _____________________________________________________________________             How to Manage Your Diabetes Before and After Surgery  Why is it important to control my blood sugar before and after surgery? . Improving blood sugar levels before and after surgery helps healing and can limit problems. . A way of improving blood sugar control is eating a healthy diet by: o  Eating less sugar and carbohydrates o  Increasing activity/exercise o  Talking with your doctor about reaching your blood sugar goals . High blood sugars (greater than 180 mg/dL) can raise your risk of infections and slow your recovery, so you will need to focus on controlling your diabetes during the weeks before surgery. . Make sure that the doctor who takes care of your diabetes knows about your planned surgery including the date and location.  How do I manage my blood sugar before  surgery? . Check your blood sugar at least 4 times a day, starting 2 days before surgery, to make sure that the level is not too high or low. o Check your blood sugar the morning of your surgery when you wake up and every 2 hours until you get to the Short Stay unit. . If your blood sugar is less than 70 mg/dL, you will need to treat for low blood sugar: o Do not take insulin. o Treat a low blood sugar (less than 70 mg/dL) with  cup of clear juice (cranberry or apple), 4 glucose tablets, OR glucose gel. o Recheck blood sugar in 15 minutes after treatment (to make sure it is greater than 70 mg/dL). If your blood sugar is not greater than 70 mg/dL on recheck, call [phone] for further instructions. . Report your blood sugar to the short stay nurse when you get to Short Stay.  . If  you are admitted to the hospital after surgery: o Your blood sugar will be checked by the staff and you will probably be given insulin after surgery (instead of oral diabetes medicines) to make sure you have good blood sugar levels. o The goal for blood sugar control after surgery is 80-180 mg/dL.   WHAT DO I DO ABOUT MY DIABETES MEDICATION?  Marland Kitchen Do not take oral diabetes medicines (pills) the morning of surgery.  . THE DAY BEFORE SURGERY, take your usual dose of Metformin      Reviewed and Endorsed by [facility] Patient Education Committee, August 2015   [facility] - Preparing for Surgery Before surgery, you can play an important role.  Because skin is not sterile, your skin needs to be as free of germs as possible.  You can reduce the number of germs on your skin by washing with CHG (chlorahexidine gluconate) soap before surgery.  CHG is an antiseptic cleaner which kills germs and bonds with the skin to continue killing germs even after washing. Please DO NOT use if you have an allergy to CHG or antibacterial soaps.  If your skin becomes reddened/irritated stop using the CHG and inform your nurse when you  arrive at Short Stay. Do not shave (including legs and underarms) for at least 48 hours prior to the first CHG shower.  You may shave your face/neck. Please follow these instructions carefully:  1.  Shower with CHG Soap the night before surgery and the  morning of Surgery.  2.  If you choose to wash your hair, wash your hair first as usual with your  normal  shampoo.  3.  After you shampoo, rinse your hair and body thoroughly to remove the  shampoo.                           4.  Use CHG as you would any other liquid soap.  You can apply chg directly  to the skin and wash                       Gently with a scrungie or clean washcloth.  5.  Apply the CHG Soap to your body ONLY FROM THE NECK DOWN.   Do not use on face/ open                           Wound or open sores. Avoid contact with eyes, ears mouth and genitals (private parts).                       Wash face,  Genitals (private parts) with your normal soap.             6.  Wash thoroughly, paying special attention to the area where your surgery  will be performed.  7.  Thoroughly rinse your body with warm water from the neck down.  8.  DO NOT shower/wash with your normal soap after using and rinsing off  the CHG Soap.                9.  Pat yourself dry with a clean towel.            10.  Wear clean pajamas.            11.  Place clean sheets on your bed the night of your first shower and do  not  sleep with pets. Day of Surgery : Do not apply any lotions/deodorants the morning of surgery.  Please wear clean clothes to the hospital/surgery center.  FAILURE TO FOLLOW THESE INSTRUCTIONS MAY RESULT IN THE CANCELLATION OF YOUR SURGERY PATIENT SIGNATURE_________________________________  NURSE SIGNATURE__________________________________  ________________________________________________________________________  WHAT IS A BLOOD TRANSFUSION? Blood Transfusion Information  A transfusion is the replacement of blood or some of its parts. Blood  is made up of multiple cells which provide different functions.  Red blood cells carry oxygen and are used for blood loss replacement.  White blood cells fight against infection.  Platelets control bleeding.  Plasma helps clot blood.  Other blood products are available for specialized needs, such as hemophilia or other clotting disorders. BEFORE THE TRANSFUSION  Who gives blood for transfusions?   Healthy volunteers who are fully evaluated to make sure their blood is safe. This is blood bank blood. Transfusion therapy is the safest it has ever been in the practice of medicine. Before blood is taken from a donor, a complete history is taken to make sure that person has no history of diseases nor engages in risky social behavior (examples are intravenous drug use or sexual activity with multiple partners). The donor's travel history is screened to minimize risk of transmitting infections, such as malaria. The donated blood is tested for signs of infectious diseases, such as HIV and hepatitis. The blood is then tested to be sure it is compatible with you in order to minimize the chance of a transfusion reaction. If you or a relative donates blood, this is often done in anticipation of surgery and is not appropriate for emergency situations. It takes many days to process the donated blood. RISKS AND COMPLICATIONS Although transfusion therapy is very safe and saves many lives, the main dangers of transfusion include:   Getting an infectious disease.  Developing a transfusion reaction. This is an allergic reaction to something in the blood you were given. Every precaution is taken to prevent this. The decision to have a blood transfusion has been considered carefully by your caregiver before blood is given. Blood is not given unless the benefits outweigh the risks. AFTER THE TRANSFUSION  Right after receiving a blood transfusion, you will usually feel much better and more energetic. This is  especially true if your red blood cells have gotten low (anemic). The transfusion raises the level of the red blood cells which carry oxygen, and this usually causes an energy increase.  The nurse administering the transfusion will monitor you carefully for complications. HOME CARE INSTRUCTIONS  No special instructions are needed after a transfusion. You may find your energy is better. Speak with your caregiver about any limitations on activity for underlying diseases you may have. SEEK MEDICAL CARE IF:   Your condition is not improving after your transfusion.  You develop redness or irritation at the intravenous (IV) site. SEEK IMMEDIATE MEDICAL CARE IF:  Any of the following symptoms occur over the next 12 hours:  Shaking chills.  You have a temperature by mouth above 102 F (38.9 C), not controlled by medicine.  Chest, back, or muscle pain.  People around you feel you are not acting correctly or are confused.  Shortness of breath or difficulty breathing.  Dizziness and fainting.  You get a rash or develop hives.  You have a decrease in urine output.  Your urine turns a dark color or changes to pink, red, or brown. Any of the following symptoms occur over the next  10 days:  You have a temperature by mouth above 102 F (38.9 C), not controlled by medicine.  Shortness of breath.  Weakness after normal activity.  The white part of the eye turns yellow (jaundice).  You have a decrease in the amount of urine or are urinating less often.  Your urine turns a dark color or changes to pink, red, or brown. Document Released: 03/02/2000 Document Revised: 05/28/2011 Document Reviewed: 10/20/2007 [facility] Patient Information 2014 [place], [place].  _______________________________________________________________________

## 2017-06-11 NOTE — Progress Notes (Signed)
12-14-16 (Epic) EKG, CXR

## 2017-06-13 ENCOUNTER — Encounter (HOSPITAL_COMMUNITY): Payer: Self-pay

## 2017-06-13 ENCOUNTER — Encounter (HOSPITAL_COMMUNITY)
Admission: RE | Admit: 2017-06-13 | Discharge: 2017-06-13 | Disposition: A | Discharge status: Home / Self Care | Payer: Medicaid Other | Source: Ambulatory Visit | Attending: Obstetrics & Gynecology | Admitting: Obstetrics & Gynecology

## 2017-06-13 ENCOUNTER — Other Ambulatory Visit: Payer: Self-pay

## 2017-06-13 DIAGNOSIS — D259 Leiomyoma of uterus, unspecified: Secondary | ICD-10-CM | POA: Insufficient documentation

## 2017-06-13 DIAGNOSIS — Z01812 Encounter for preprocedural laboratory examination: Secondary | ICD-10-CM | POA: Diagnosis not present

## 2017-06-13 DIAGNOSIS — Z0183 Encounter for blood typing: Secondary | ICD-10-CM | POA: Insufficient documentation

## 2017-06-13 DIAGNOSIS — Z01818 Encounter for other preprocedural examination: Secondary | ICD-10-CM | POA: Insufficient documentation

## 2017-06-13 DIAGNOSIS — R102 Pelvic and perineal pain: Secondary | ICD-10-CM | POA: Diagnosis not present

## 2017-06-13 DIAGNOSIS — N939 Abnormal uterine and vaginal bleeding, unspecified: Secondary | ICD-10-CM | POA: Diagnosis not present

## 2017-06-13 DIAGNOSIS — R9431 Abnormal electrocardiogram [ECG] [EKG]: Secondary | ICD-10-CM | POA: Insufficient documentation

## 2017-06-13 LAB — GLUCOSE, CAPILLARY: Glucose-Capillary: 122 mg/dL — ABNORMAL HIGH (ref 65–99)

## 2017-06-13 LAB — CBC
HCT: 36.3 % (ref 36.0–46.0)
HEMOGLOBIN: 11.1 g/dL — AB (ref 12.0–15.0)
MCH: 23.6 pg — AB (ref 26.0–34.0)
MCHC: 30.6 g/dL (ref 30.0–36.0)
MCV: 77.1 fL — AB (ref 78.0–100.0)
PLATELETS: 327 10*3/uL (ref 150–400)
RBC: 4.71 MIL/uL (ref 3.87–5.11)
RDW: 16 % — ABNORMAL HIGH (ref 11.5–15.5)
WBC: 4.5 10*3/uL (ref 4.0–10.5)

## 2017-06-13 LAB — PREGNANCY, URINE: Preg Test, Ur: NEGATIVE

## 2017-06-13 LAB — HEMOGLOBIN A1C
Hgb A1c MFr Bld: 5.4 % (ref 4.8–5.6)
Mean Plasma Glucose: 108.28 mg/dL

## 2017-06-13 LAB — COMPREHENSIVE METABOLIC PANEL
ALK PHOS: 30 U/L — AB (ref 38–126)
ALT: 13 U/L — AB (ref 14–54)
AST: 19 U/L (ref 15–41)
Albumin: 4.1 g/dL (ref 3.5–5.0)
Anion gap: 12 (ref 5–15)
BUN: 14 mg/dL (ref 6–20)
CALCIUM: 9.6 mg/dL (ref 8.9–10.3)
CO2: 29 mmol/L (ref 22–32)
CREATININE: 0.78 mg/dL (ref 0.44–1.00)
Chloride: 100 mmol/L — ABNORMAL LOW (ref 101–111)
Glucose, Bld: 126 mg/dL — ABNORMAL HIGH (ref 65–99)
Potassium: 3.7 mmol/L (ref 3.5–5.1)
Sodium: 141 mmol/L (ref 135–145)
TOTAL PROTEIN: 7.6 g/dL (ref 6.5–8.1)
Total Bilirubin: 0.5 mg/dL (ref 0.3–1.2)

## 2017-06-13 LAB — ABO/RH: ABO/RH(D): O POS

## 2017-06-14 ENCOUNTER — Telehealth: Payer: Self-pay | Admitting: General Practice

## 2017-06-14 NOTE — Telephone Encounter (Signed)
Patient called into front office stating she needs to know how long she is going to be out of work with her upcoming surgery. Told patient it is usually 6 weeks. Patient states she needs a note for work to reflect that. Asked patient if she needs a note or FMLA papers filled out. Patient states just a note. Told patient she can come by the office during office hours to pick that up. Patient verbalized understanding & had no questions

## 2017-06-18 ENCOUNTER — Encounter (HOSPITAL_COMMUNITY): Payer: Self-pay

## 2017-06-18 ENCOUNTER — Ambulatory Visit (HOSPITAL_COMMUNITY): Payer: Medicaid Other | Admitting: Anesthesiology

## 2017-06-18 ENCOUNTER — Encounter (HOSPITAL_COMMUNITY)
Admission: RE | Disposition: A | Discharge status: Home / Self Care | Payer: Self-pay | Source: Ambulatory Visit | Attending: Obstetrics & Gynecology

## 2017-06-18 ENCOUNTER — Observation Stay (HOSPITAL_COMMUNITY)
Admission: RE | Admit: 2017-06-18 | Discharge: 2017-06-19 | Disposition: A | Discharge status: Home / Self Care | Payer: Medicaid Other | Source: Ambulatory Visit | Attending: Obstetrics & Gynecology | Admitting: Obstetrics & Gynecology

## 2017-06-18 DIAGNOSIS — I10 Essential (primary) hypertension: Secondary | ICD-10-CM | POA: Insufficient documentation

## 2017-06-18 DIAGNOSIS — D252 Subserosal leiomyoma of uterus: Secondary | ICD-10-CM | POA: Diagnosis not present

## 2017-06-18 DIAGNOSIS — D0511 Intraductal carcinoma in situ of right breast: Secondary | ICD-10-CM | POA: Diagnosis present

## 2017-06-18 DIAGNOSIS — D250 Submucous leiomyoma of uterus: Secondary | ICD-10-CM | POA: Diagnosis not present

## 2017-06-18 DIAGNOSIS — N920 Excessive and frequent menstruation with regular cycle: Secondary | ICD-10-CM | POA: Insufficient documentation

## 2017-06-18 DIAGNOSIS — N8311 Corpus luteum cyst of right ovary: Secondary | ICD-10-CM | POA: Insufficient documentation

## 2017-06-18 DIAGNOSIS — Z9889 Other specified postprocedural states: Secondary | ICD-10-CM

## 2017-06-18 DIAGNOSIS — Z6841 Body Mass Index (BMI) 40.0 and over, adult: Secondary | ICD-10-CM | POA: Diagnosis not present

## 2017-06-18 DIAGNOSIS — D251 Intramural leiomyoma of uterus: Secondary | ICD-10-CM

## 2017-06-18 DIAGNOSIS — N8312 Corpus luteum cyst of left ovary: Secondary | ICD-10-CM | POA: Insufficient documentation

## 2017-06-18 DIAGNOSIS — Z853 Personal history of malignant neoplasm of breast: Secondary | ICD-10-CM | POA: Diagnosis not present

## 2017-06-18 DIAGNOSIS — Z886 Allergy status to analgesic agent status: Secondary | ICD-10-CM | POA: Insufficient documentation

## 2017-06-18 DIAGNOSIS — Z7984 Long term (current) use of oral hypoglycemic drugs: Secondary | ICD-10-CM | POA: Insufficient documentation

## 2017-06-18 DIAGNOSIS — E119 Type 2 diabetes mellitus without complications: Secondary | ICD-10-CM | POA: Diagnosis not present

## 2017-06-18 DIAGNOSIS — Z79899 Other long term (current) drug therapy: Secondary | ICD-10-CM | POA: Diagnosis not present

## 2017-06-18 DIAGNOSIS — D259 Leiomyoma of uterus, unspecified: Secondary | ICD-10-CM | POA: Diagnosis present

## 2017-06-18 DIAGNOSIS — J45909 Unspecified asthma, uncomplicated: Secondary | ICD-10-CM | POA: Insufficient documentation

## 2017-06-18 DIAGNOSIS — C50411 Malignant neoplasm of upper-outer quadrant of right female breast: Secondary | ICD-10-CM | POA: Diagnosis present

## 2017-06-18 HISTORY — PX: ROBOTIC ASSISTED TOTAL HYSTERECTOMY: SHX6085

## 2017-06-18 HISTORY — PX: SALPINGOOPHORECTOMY: SHX82

## 2017-06-18 LAB — GLUCOSE, CAPILLARY
GLUCOSE-CAPILLARY: 90 mg/dL (ref 65–99)
GLUCOSE-CAPILLARY: 136 mg/dL — AB (ref 65–99)
GLUCOSE-CAPILLARY: 144 mg/dL — AB (ref 65–99)
GLUCOSE-CAPILLARY: 152 mg/dL — AB (ref 65–99)
Glucose-Capillary: 81 mg/dL (ref 65–99)
Glucose-Capillary: 77 mg/dL (ref 65–99)
Glucose-Capillary: 157 mg/dL — ABNORMAL HIGH (ref 65–99)

## 2017-06-18 LAB — TYPE AND SCREEN
ABO/RH(D): O POS
Antibody Screen: NEGATIVE

## 2017-06-18 SURGERY — HYSTERECTOMY, TOTAL, ROBOT-ASSISTED
Anesthesia: General

## 2017-06-18 MED ORDER — PROPOFOL 10 MG/ML IV BOLUS
INTRAVENOUS | Status: AC
  Filled 2017-06-18: qty 20

## 2017-06-18 MED ORDER — POLYETHYLENE GLYCOL 3350 17 G PO PACK: 17.0000 g | PACK | Freq: Every day | ORAL | Status: DC | PRN

## 2017-06-18 MED ORDER — SOD CITRATE-CITRIC ACID 500-334 MG/5ML PO SOLN: 30.0000 mL | ORAL | Status: DC

## 2017-06-18 MED ORDER — KETOROLAC TROMETHAMINE 30 MG/ML IJ SOLN
INTRAMUSCULAR | Status: AC
  Filled 2017-06-18: qty 1

## 2017-06-18 MED ORDER — SODIUM CHLORIDE 0.9 % IJ SOLN
INTRAMUSCULAR | Status: AC
  Filled 2017-06-18: qty 10

## 2017-06-18 MED ORDER — HYDROMORPHONE HCL 1 MG/ML IJ SOLN: 0.2000 mg | INTRAMUSCULAR | Status: DC | PRN

## 2017-06-18 MED ORDER — PROMETHAZINE HCL 25 MG/ML IJ SOLN: 6.2500 mg | INTRAMUSCULAR | Status: DC | PRN

## 2017-06-18 MED ORDER — MENTHOL 3 MG MT LOZG: 1.0000 | LOZENGE | OROMUCOSAL | Status: DC | PRN

## 2017-06-18 MED ORDER — ONDANSETRON HCL 4 MG/2ML IJ SOLN
INTRAMUSCULAR | Status: DC | PRN
  Administered 2017-06-18: 4 mg via INTRAVENOUS

## 2017-06-18 MED ORDER — ONDANSETRON HCL 4 MG/2ML IJ SOLN
INTRAMUSCULAR | Status: AC
  Filled 2017-06-18: qty 2

## 2017-06-18 MED ORDER — SIMETHICONE 80 MG PO CHEW: 80.0000 mg | CHEWABLE_TABLET | Freq: Four times a day (QID) | ORAL | Status: DC | PRN

## 2017-06-18 MED ORDER — DOCUSATE SODIUM 100 MG PO CAPS
ORAL_CAPSULE | ORAL | Status: AC
  Filled 2017-06-18: qty 1

## 2017-06-18 MED ORDER — SODIUM CHLORIDE 0.9 % IJ SOLN
INTRAMUSCULAR | Status: DC | PRN
  Administered 2017-06-18: 10 mL

## 2017-06-18 MED ORDER — KETOROLAC TROMETHAMINE 30 MG/ML IJ SOLN
30.0000 mg | Freq: Four times a day (QID) | INTRAMUSCULAR | Status: DC
  Administered 2017-06-18 – 2017-06-19 (×2): 30 mg via INTRAVENOUS

## 2017-06-18 MED ORDER — ONDANSETRON HCL 4 MG PO TABS: 4.0000 mg | ORAL_TABLET | Freq: Four times a day (QID) | ORAL | Status: DC | PRN

## 2017-06-18 MED ORDER — CEFAZOLIN SODIUM-DEXTROSE 2-4 GM/100ML-% IV SOLN
2.0000 g | INTRAVENOUS | Status: AC
  Administered 2017-06-18: 2 g via INTRAVENOUS
  Filled 2017-06-18: qty 100

## 2017-06-18 MED ORDER — ALBUMIN HUMAN 5 % IV SOLN
INTRAVENOUS | Status: AC
  Filled 2017-06-18: qty 500

## 2017-06-18 MED ORDER — OXYCODONE HCL 5 MG/5ML PO SOLN
5.0000 mg | Freq: Once | ORAL | Status: DC | PRN
  Filled 2017-06-18: qty 5

## 2017-06-18 MED ORDER — PHENYLEPHRINE HCL 10 MG/ML IJ SOLN
INTRAMUSCULAR | Status: DC | PRN
  Administered 2017-06-18: 80 ug via INTRAVENOUS

## 2017-06-18 MED ORDER — SUGAMMADEX SODIUM 200 MG/2ML IV SOLN
INTRAVENOUS | Status: AC
  Filled 2017-06-18: qty 2

## 2017-06-18 MED ORDER — LIDOCAINE HCL (CARDIAC) 20 MG/ML IV SOLN
INTRAVENOUS | Status: DC | PRN
  Administered 2017-06-18: 70 mg via INTRAVENOUS

## 2017-06-18 MED ORDER — DEXAMETHASONE SODIUM PHOSPHATE 10 MG/ML IJ SOLN
INTRAMUSCULAR | Status: AC
  Filled 2017-06-18: qty 1

## 2017-06-18 MED ORDER — KETOROLAC TROMETHAMINE 30 MG/ML IJ SOLN: 30.0000 mg | Freq: Four times a day (QID) | INTRAMUSCULAR | Status: DC

## 2017-06-18 MED ORDER — MEPERIDINE HCL 50 MG/ML IJ SOLN: 6.2500 mg | INTRAMUSCULAR | Status: DC | PRN

## 2017-06-18 MED ORDER — LACTATED RINGERS IV SOLN
INTRAVENOUS | Status: DC
  Administered 2017-06-18: 16:00:00 via INTRAVENOUS
  Administered 2017-06-18: 1000 mL via INTRAVENOUS

## 2017-06-18 MED ORDER — DEXAMETHASONE SODIUM PHOSPHATE 10 MG/ML IJ SOLN
INTRAMUSCULAR | Status: DC | PRN
  Administered 2017-06-18: 10 mg via INTRAVENOUS

## 2017-06-18 MED ORDER — ROCURONIUM BROMIDE 100 MG/10ML IV SOLN
INTRAVENOUS | Status: DC | PRN
  Administered 2017-06-18: 20 mg via INTRAVENOUS
  Administered 2017-06-18: 60 mg via INTRAVENOUS
  Administered 2017-06-18: 20 mg via INTRAVENOUS

## 2017-06-18 MED ORDER — ROCURONIUM BROMIDE 10 MG/ML (PF) SYRINGE
PREFILLED_SYRINGE | INTRAVENOUS | Status: AC
  Filled 2017-06-18: qty 5

## 2017-06-18 MED ORDER — BISACODYL 10 MG RE SUPP: 10.0000 mg | Freq: Every day | RECTAL | Status: DC | PRN

## 2017-06-18 MED ORDER — OXYCODONE HCL 5 MG PO TABS: 5.0000 mg | ORAL_TABLET | Freq: Once | ORAL | Status: DC | PRN

## 2017-06-18 MED ORDER — ARTIFICIAL TEARS OPHTHALMIC OINT
TOPICAL_OINTMENT | OPHTHALMIC | Status: DC | PRN
  Administered 2017-06-18: 1 via OPHTHALMIC

## 2017-06-18 MED ORDER — LIDOCAINE 2% (20 MG/ML) 5 ML SYRINGE
INTRAMUSCULAR | Status: AC
  Filled 2017-06-18: qty 5

## 2017-06-18 MED ORDER — ONDANSETRON HCL 4 MG/2ML IJ SOLN: 4.0000 mg | Freq: Four times a day (QID) | INTRAMUSCULAR | Status: DC | PRN

## 2017-06-18 MED ORDER — PANTOPRAZOLE SODIUM 40 MG PO TBEC
40.0000 mg | DELAYED_RELEASE_TABLET | Freq: Every day | ORAL | Status: DC
  Filled 2017-06-18: qty 1

## 2017-06-18 MED ORDER — BUPIVACAINE HCL (PF) 0.5 % IJ SOLN
INTRAMUSCULAR | Status: DC | PRN
  Administered 2017-06-18: 30 mL

## 2017-06-18 MED ORDER — DOCUSATE SODIUM 100 MG PO CAPS
100.0000 mg | ORAL_CAPSULE | Freq: Two times a day (BID) | ORAL | Status: DC
  Administered 2017-06-18: 100 mg via ORAL

## 2017-06-18 MED ORDER — HYDROMORPHONE HCL 1 MG/ML IJ SOLN
INTRAMUSCULAR | Status: AC
  Administered 2017-06-18: 0.5 mg via INTRAVENOUS
  Filled 2017-06-18: qty 2

## 2017-06-18 MED ORDER — KETOROLAC TROMETHAMINE 30 MG/ML IJ SOLN
INTRAMUSCULAR | Status: DC | PRN
  Administered 2017-06-18: 30 mg via INTRAVENOUS

## 2017-06-18 MED ORDER — ARTIFICIAL TEARS OPHTHALMIC OINT
TOPICAL_OINTMENT | OPHTHALMIC | Status: AC
  Filled 2017-06-18: qty 3.5

## 2017-06-18 MED ORDER — MIDAZOLAM HCL 2 MG/2ML IJ SOLN
INTRAMUSCULAR | Status: AC
  Filled 2017-06-18: qty 2

## 2017-06-18 MED ORDER — SODIUM CHLORIDE 0.9 % IR SOLN
Status: DC | PRN
  Administered 2017-06-18: 3000 mL

## 2017-06-18 MED ORDER — SUGAMMADEX SODIUM 200 MG/2ML IV SOLN
INTRAVENOUS | Status: DC | PRN
  Administered 2017-06-18: 250 mg via INTRAVENOUS

## 2017-06-18 MED ORDER — SUGAMMADEX SODIUM 500 MG/5ML IV SOLN
INTRAVENOUS | Status: AC
  Filled 2017-06-18: qty 5

## 2017-06-18 MED ORDER — FENTANYL CITRATE (PF) 250 MCG/5ML IJ SOLN
INTRAMUSCULAR | Status: DC | PRN
  Administered 2017-06-18 (×2): 50 ug via INTRAVENOUS
  Administered 2017-06-18 (×2): 100 ug via INTRAVENOUS
  Administered 2017-06-18 (×2): 50 ug via INTRAVENOUS

## 2017-06-18 MED ORDER — PROPOFOL 10 MG/ML IV BOLUS
INTRAVENOUS | Status: DC | PRN
  Administered 2017-06-18: 150 mg via INTRAVENOUS

## 2017-06-18 MED ORDER — PANTOPRAZOLE SODIUM 40 MG IV SOLR
INTRAVENOUS | Status: AC
  Filled 2017-06-18: qty 40

## 2017-06-18 MED ORDER — PHENYLEPHRINE 40 MCG/ML (10ML) SYRINGE FOR IV PUSH (FOR BLOOD PRESSURE SUPPORT)
PREFILLED_SYRINGE | INTRAVENOUS | Status: AC
  Filled 2017-06-18: qty 10

## 2017-06-18 MED ORDER — WHITE PETROLATUM EX OINT
TOPICAL_OINTMENT | CUTANEOUS | Status: AC
  Filled 2017-06-18: qty 5

## 2017-06-18 MED ORDER — BUPIVACAINE HCL (PF) 0.5 % IJ SOLN
INTRAMUSCULAR | Status: AC
  Filled 2017-06-18: qty 30

## 2017-06-18 MED ORDER — FENTANYL CITRATE (PF) 100 MCG/2ML IJ SOLN
INTRAMUSCULAR | Status: AC
  Filled 2017-06-18: qty 2

## 2017-06-18 MED ORDER — IBUPROFEN 600 MG PO TABS: 600.0000 mg | ORAL_TABLET | Freq: Four times a day (QID) | ORAL | Status: DC | PRN

## 2017-06-18 MED ORDER — OXYCODONE-ACETAMINOPHEN 5-325 MG PO TABS
1.0000 | ORAL_TABLET | ORAL | Status: DC | PRN
  Administered 2017-06-19 (×2): 1 via ORAL

## 2017-06-18 MED ORDER — DEXTROSE-NACL 5-0.45 % IV SOLN
INTRAVENOUS | Status: AC
  Administered 2017-06-19: 03:00:00 via INTRAVENOUS

## 2017-06-18 MED ORDER — FENTANYL CITRATE (PF) 250 MCG/5ML IJ SOLN
INTRAMUSCULAR | Status: AC
  Filled 2017-06-18: qty 5

## 2017-06-18 MED ORDER — DEXTROSE 50 % IV SOLN
INTRAVENOUS | Status: AC
  Filled 2017-06-18: qty 50

## 2017-06-18 MED ORDER — DEXTROSE 50 % IV SOLN
25.0000 mL | Freq: Once | INTRAVENOUS | Status: AC
  Administered 2017-06-18: 25 mL via INTRAVENOUS

## 2017-06-18 MED ORDER — OXYCODONE-ACETAMINOPHEN 5-325 MG PO TABS: 2.0000 | ORAL_TABLET | ORAL | Status: DC | PRN

## 2017-06-18 MED ORDER — ALBUMIN HUMAN 5 % IV SOLN
INTRAVENOUS | Status: DC | PRN
  Administered 2017-06-18: 18:00:00 via INTRAVENOUS

## 2017-06-18 MED ORDER — HYDROMORPHONE HCL 1 MG/ML IJ SOLN
0.2500 mg | INTRAMUSCULAR | Status: DC | PRN
  Administered 2017-06-18: 0.5 mg via INTRAVENOUS
  Administered 2017-06-18: 0.25 mg via INTRAVENOUS
  Administered 2017-06-18: 0.5 mg via INTRAVENOUS

## 2017-06-18 MED ORDER — MIDAZOLAM HCL 2 MG/2ML IJ SOLN
INTRAMUSCULAR | Status: DC | PRN
  Administered 2017-06-18: 2 mg via INTRAVENOUS

## 2017-06-18 SURGICAL SUPPLY — 70 items
ADH SKN CLS APL DERMABOND .7 (GAUZE/BANDAGES/DRESSINGS) ×2
APL SRG 38 LTWT LNG FL B (MISCELLANEOUS)
APPLICATOR ARISTA FLEXITIP XL (MISCELLANEOUS) IMPLANT
APPLIER CLIP 5 13 M/L LIGAMAX5 (MISCELLANEOUS)
APR CLP MED LRG 5 ANG JAW (MISCELLANEOUS)
BARRIER ADHS 3X4 INTERCEED (GAUZE/BANDAGES/DRESSINGS) IMPLANT
BRR ADH 4X3 ABS CNTRL BYND (GAUZE/BANDAGES/DRESSINGS)
CANISTER SUCT 3000ML PPV (MISCELLANEOUS) ×4 IMPLANT
CATH FOLEY 3WAY  5CC 16FR (CATHETERS) ×2
CATH FOLEY 3WAY 5CC 16FR (CATHETERS) ×2 IMPLANT
CLIP APPLIE 5 13 M/L LIGAMAX5 (MISCELLANEOUS) IMPLANT
COVER BACK TABLE 60X90IN (DRAPES) ×4 IMPLANT
COVER TIP SHEARS 8 DVNC (MISCELLANEOUS) ×2 IMPLANT
COVER TIP SHEARS 8MM DA VINCI (MISCELLANEOUS) ×2
DECANTER SPIKE VIAL GLASS SM (MISCELLANEOUS) ×4 IMPLANT
DEFOGGER SCOPE WARMER CLEARIFY (MISCELLANEOUS) ×4 IMPLANT
DERMABOND ADVANCED (GAUZE/BANDAGES/DRESSINGS) ×2
DERMABOND ADVANCED .7 DNX12 (GAUZE/BANDAGES/DRESSINGS) ×2 IMPLANT
DRAPE ARM DVNC X/XI (DISPOSABLE) ×6 IMPLANT
DRAPE COLUMN DVNC XI (DISPOSABLE) ×2 IMPLANT
DRAPE DA VINCI XI ARM (DISPOSABLE) ×6
DRAPE DA VINCI XI COLUMN (DISPOSABLE) ×2
DURAPREP 26ML APPLICATOR (WOUND CARE) ×4 IMPLANT
ELECT REM PT RETURN 15FT ADLT (MISCELLANEOUS) ×4 IMPLANT
GLOVE BIO SURGEON STRL SZ7 (GLOVE) ×8 IMPLANT
GLOVE BIOGEL PI IND STRL 7.0 (GLOVE) ×10 IMPLANT
GLOVE BIOGEL PI INDICATOR 7.0 (GLOVE) ×10
GOWN STRL REUS W/TWL XL LVL3 (GOWN DISPOSABLE) ×4 IMPLANT
GYRUS RUMI II 2.5CM BLUE (DISPOSABLE)
GYRUS RUMI II 3.5CM BLUE (DISPOSABLE) ×4
GYRUS RUMI II 4.0CM BLUE (DISPOSABLE)
HEMOSTAT ARISTA ABSORB 3G PWDR (MISCELLANEOUS) IMPLANT
IRRIG SUCT STRYKERFLOW 2 WTIP (MISCELLANEOUS) ×4
IRRIGATION SUCT STRKRFLW 2 WTP (MISCELLANEOUS) ×2 IMPLANT
NEEDLE INSUFFLATION 120MM (ENDOMECHANICALS) ×4 IMPLANT
OBTURATOR OPTICAL STANDARD 8MM (TROCAR)
OBTURATOR OPTICAL STND 8 DVNC (TROCAR)
OBTURATOR OPTICALSTD 8 DVNC (TROCAR) IMPLANT
OCCLUDER COLPOPNEUMO (BALLOONS) ×4 IMPLANT
PACK ROBOT WH (CUSTOM PROCEDURE TRAY) ×4 IMPLANT
PACK ROBOTIC GOWN (GOWN DISPOSABLE) ×4 IMPLANT
PACK TRENDGUARD 450 HYBRID PRO (MISCELLANEOUS) IMPLANT
PAD PREP 24X48 CUFFED NSTRL (MISCELLANEOUS) ×4 IMPLANT
POSITIONER SURGICAL ARM (MISCELLANEOUS) ×8 IMPLANT
RUMI II 3.0CM BLUE KOH-EFFICIE (DISPOSABLE) IMPLANT
RUMI II GYRUS 2.5CM BLUE (DISPOSABLE) IMPLANT
RUMI II GYRUS 3.5CM BLUE (DISPOSABLE) IMPLANT
RUMI II GYRUS 4.0CM BLUE (DISPOSABLE) IMPLANT
SEAL CANN UNIV 5-8 DVNC XI (MISCELLANEOUS) ×6 IMPLANT
SEAL XI 5MM-8MM UNIVERSAL (MISCELLANEOUS) ×8
SEALER VESSEL DA VINCI XI (MISCELLANEOUS) ×2
SEALER VESSEL EXT DVNC XI (MISCELLANEOUS) IMPLANT
SET CYSTO W/LG BORE CLAMP LF (SET/KITS/TRAYS/PACK) IMPLANT
SET TRI-LUMEN FLTR TB AIRSEAL (TUBING) ×4 IMPLANT
SUT VIC AB 0 CT1 27 (SUTURE) ×8
SUT VIC AB 0 CT1 27XBRD ANBCTR (SUTURE) ×4 IMPLANT
SUT VICRYL 0 UR6 27IN ABS (SUTURE) ×8 IMPLANT
SUT VICRYL 4-0 PS2 18IN ABS (SUTURE) ×8 IMPLANT
SUT VLOC 180 0 9IN  GS21 (SUTURE) ×2
SUT VLOC 180 0 9IN GS21 (SUTURE) ×2 IMPLANT
SYSTEM CARTER THOMASON II (TROCAR) IMPLANT
TIP RUMI ORANGE 6.7MMX12CM (TIP) ×2 IMPLANT
TIP UTERINE 5.1X6CM LAV DISP (MISCELLANEOUS) IMPLANT
TIP UTERINE 6.7X10CM GRN DISP (MISCELLANEOUS) IMPLANT
TIP UTERINE 6.7X6CM WHT DISP (MISCELLANEOUS) IMPLANT
TIP UTERINE 6.7X8CM BLUE DISP (MISCELLANEOUS) IMPLANT
TOWEL OR 17X26 10 PK STRL BLUE (TOWEL DISPOSABLE) ×8 IMPLANT
TRENDGUARD 450 HYBRID PRO PACK (MISCELLANEOUS)
TROCAR PORT AIRSEAL 5X120 (TROCAR) ×4 IMPLANT
WATER STERILE IRR 1000ML POUR (IV SOLUTION) ×4 IMPLANT

## 2017-06-18 NOTE — Transfer of Care (Signed)
Immediate Anesthesia Transfer of Care Note  Patient: Sheryl Cox  Procedure(s) Performed: XI ROBOTIC ASSISTED TOTAL HYSTERECTOMY (N/A ) BILATERAL SALPINGO OOPHORECTOMY (Bilateral )  Patient Location: PACU  Anesthesia Type:General  Level of Consciousness: awake and alert   Airway & Oxygen Therapy: Patient Spontanous Breathing and Patient connected to face mask oxygen  Post-op Assessment: Report given to RN and Post -op Vital signs reviewed and stable  Post vital signs: Reviewed and stable  Last Vitals:  Vitals Value Taken Time  BP 156/99 06/18/2017  6:45 PM  Temp    Pulse 109 06/18/2017  6:47 PM  Resp 22 06/18/2017  6:47 PM  SpO2 100 % 06/18/2017  6:47 PM  Vitals shown include unvalidated device data.  Last Pain:  Vitals:   06/18/17 1136  PainSc: 0-No pain      Patients Stated Pain Goal: 5 (26/83/41 9622)  Complications: No apparent anesthesia complications

## 2017-06-18 NOTE — Anesthesia Postprocedure Evaluation (Signed)
Anesthesia Post Note  Patient: SASHIA CAMPAS  Procedure(s) Performed: XI ROBOTIC ASSISTED TOTAL HYSTERECTOMY (N/A ) BILATERAL SALPINGO OOPHORECTOMY (Bilateral )     Patient location during evaluation: PACU Anesthesia Type: General Level of consciousness: awake and sedated Pain management: pain level controlled Vital Signs Assessment: post-procedure vital signs reviewed and stable Respiratory status: spontaneous breathing Cardiovascular status: stable Postop Assessment: no apparent nausea or vomiting Anesthetic complications: no    Last Vitals:  Vitals:   06/18/17 1930 06/18/17 1954  BP: (!) 157/94 (!) 154/83  Pulse: (!) 109 (!) 111  Resp: 19 20  Temp: 37.2 C 37.2 C  SpO2: 96% 98%    Last Pain:  Vitals:   06/18/17 1915  PainSc: 7    Pain Goal: Patients Stated Pain Goal: 5 (06/18/17 1136)               [place]

## 2017-06-18 NOTE — H&P (Signed)
Preoperative History and Physical  Sheryl Cox is a 47 y.o. G1P1001 here for surgical management of AUB and fibroids. Pt with breast cancer.  On Tamoxifen x 4 years.       Proposed surgery: RATH with BSO.  Past Medical History:  Diagnosis Date  . Anemia    history of blood transfusion-no abnormal reaction noted  . Arthritis   . Asthma    Albuterol as needed  . 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 Diabetes mellitus without complication ([place])    takes Metformin daily  . History of migraine    several yrs ago  . Hypertension    takes Tribenzor daily  . Joint pain    Past Surgical History:  Procedure Laterality Date  . BIOPSY BREAST Left 12/05/12   fibroadenoma  . BREAST LUMPECTOMY     2014  . BREAST SURGERY Right   . CESAREAN SECTION  01/05/2009  . CHOLECYSTECTOMY N/A 01/31/2016   Procedure: LAPAROSCOPIC CHOLECYSTECTOMY;  Surgeon: Erroll Luna, MD;  Location: [place];  Service: General;  Laterality: N/A;  . DILITATION & CURRETTAGE/HYSTROSCOPY WITH HYDROTHERMAL ABLATION N/A 08/12/2013   Procedure: DILATATION & CURETTAGE/HYSTEROSCOPY WITH HYDROTHERMAL ABLATION;  Surgeon: Frederico Hamman, MD;  Location: [facility];  Service: Gynecology;  Laterality: N/A;   OB History    Gravida  1   Para  1   Term  1   Preterm  0   AB  0   Living  1     SAB  0   TAB  0   Ectopic  0   Multiple  0   Live Births  1          Patient denies any cervical dysplasia or STIs. Medications Prior to Admission  Medication Sig Dispense Refill Last Dose  . albuterol (PROAIR HFA) 108 (90 BASE) MCG/ACT inhaler Inhale 2 puffs into the lungs every 6 (six) hours as needed for shortness of breath.    Past Week at Unknown time  . amLODipine (NORVASC) 10 MG tablet Take 10 mg by mouth daily.  3 06/17/2017 at Unknown time  . hydrochlorothiazide (HYDRODIURIL) 12.5 MG tablet Take 12.5 mg by mouth daily.  3 06/17/2017 at Unknown time  . ibuprofen (ADVIL,MOTRIN) 200  MG tablet Take 400 mg by mouth every 6 (six) hours as needed for headache or moderate pain.   Past Month at Unknown time  . metFORMIN (GLUCOPHAGE) 500 MG tablet Take 500 mg by mouth daily.  2 06/17/2017 at Unknown time  . Multiple Vitamins-Minerals (MULTIVITAMIN PO) Take 1 tablet by mouth daily.   06/17/2017 at Unknown time  . tamoxifen (NOLVADEX) 20 MG tablet TAKE 1 TABLET BY MOUTH EVERY DAY 90 tablet 3 06/17/2017 at Unknown time  . traMADol (ULTRAM) 50 MG tablet Take 1 tablet (50 mg total) by mouth every 6 (six) hours as needed. (Patient not taking: Reported on 05/02/2017) 10 tablet 0 Completed Course at Unknown time  . tranexamic acid (LYSTEDA) 650 MG TABS tablet Take 2 tablets (1,300 mg total) by mouth 3 (three) times daily. During menses (Patient not taking: Reported on 05/22/2017) 30 tablet 3 Not Taking at Unknown time    Allergies  Allergen Reactions  . Shrimp [Shellfish Allergy] Anaphylaxis and Swelling  . Aleve [Naproxen] Hypertension    MD told the patient to not take this because it will elevate her B/P   Social History:   reports that she has never smoked. She has never used  smokeless tobacco. She reports that she drinks about 0.6 oz of alcohol per week. She reports that she does not use drugs. Family History  Problem Relation Age of Onset  . Hypertension Mother   . Diabetes Mother   . Breast cancer Paternal Grandmother        dx in her 79s  . Prostate cancer Paternal Grandfather   . Prostate cancer Father 4  . Prostate cancer Paternal Uncle   . Breast cancer Other        paternal grandmother's sister  . Anesthesia problems Neg Hx     Review of Systems: Noncontributory  PHYSICAL EXAM: Blood pressure (!) 149/96, pulse (!) 113, temperature 98.5 F (36.9 C), resp. rate 18, height 5\' 3"  (1.6 m), weight 228 lb (103.4 kg), last menstrual period 05/27/2017, SpO2 100 %. General appearance - alert, well appearing, and in no distress Chest - clear to auscultation, no wheezes, rales or  rhonchi, symmetric air entry Heart - normal rate and regular rhythm Abdomen - soft, nontender, nondistended, no masses or organomegaly Pelvic - examination not indicated Extremities - peripheral pulses normal, no pedal edema, no clubbing or cyanosis  Labs: Results for orders placed or performed during the hospital encounter of 06/18/17 (from the past 336 hour(s))  Glucose, capillary   Collection Time: 06/18/17 11:07 AM  Result Value Ref Range   Glucose-Capillary 90 65 - 99 mg/dL   Comment 1 Notify RN   Glucose, capillary   Collection Time: 06/18/17 11:55 AM  Result Value Ref Range   Glucose-Capillary 81 65 - 99 mg/dL  Glucose, capillary   Collection Time: 06/18/17  2:04 PM  Result Value Ref Range   Glucose-Capillary 77 65 - 99 mg/dL   Comment 1 Notify RN   Glucose, capillary   Collection Time: 06/18/17  3:06 PM  Result Value Ref Range   Glucose-Capillary 136 (H) 65 - 99 mg/dL  Results for orders placed or performed during the hospital encounter of 06/13/17 (from the past 336 hour(s))  ABO/Rh   Collection Time: 06/13/17  8:44 AM  Result Value Ref Range   ABO/RH(D)      O POS Performed at [facility], [place] [address] [place], [place] [address]   Glucose, capillary   Collection Time: 06/13/17  9:08 AM  Result Value Ref Range   Glucose-Capillary 122 (H) 65 - 99 mg/dL  Type and screen   Collection Time: 06/13/17  9:44 AM  Result Value Ref Range   ABO/RH(D) O POS    Antibody Screen NEG    Sample Expiration 06/21/2017    Extend sample reason      NO TRANSFUSIONS OR PREGNANCY IN THE PAST 3 MONTHS Performed at [facility], [place] [address], [place], [place] [address]   CBC   Collection Time: 06/13/17  9:57 AM  Result Value Ref Range   WBC 4.5 4.0 - 10.5 K/uL   RBC 4.71 3.87 - 5.11 MIL/uL   Hemoglobin 11.1 (L) 12.0 - 15.0 g/dL   HCT 36.3 36.0 - 46.0 %   MCV 77.1 (L) 78.0 - 100.0 fL   MCH 23.6 (L) 26.0 - 34.0 pg   MCHC 30.6 30.0 -  36.0 g/dL   RDW 16.0 (H) 11.5 - 15.5 %   Platelets 327 150 - 400 K/uL  Comprehensive metabolic panel   Collection Time: 06/13/17  9:57 AM  Result Value Ref Range   Sodium 141 135 - 145 mmol/L   Potassium 3.7 3.5 - 5.1  mmol/L   Chloride 100 (L) 101 - 111 mmol/L   CO2 29 22 - 32 mmol/L   Glucose, Bld 126 (H) 65 - 99 mg/dL   BUN 14 6 - 20 mg/dL   Creatinine, Ser 0.78 0.44 - 1.00 mg/dL   Calcium 9.6 8.9 - 10.3 mg/dL   Total Protein 7.6 6.5 - 8.1 g/dL   Albumin 4.1 3.5 - 5.0 g/dL   AST 19 15 - 41 U/L   ALT 13 (L) 14 - 54 U/L   Alkaline Phosphatase 30 (L) 38 - 126 U/L   Total Bilirubin 0.5 0.3 - 1.2 mg/dL   GFR calc non Af Amer >60 >60 mL/min   GFR calc Af Amer >60 >60 mL/min   Anion gap 12 5 - 15  Pregnancy, urine   Collection Time: 06/13/17  9:57 AM  Result Value Ref Range   Preg Test, Ur NEGATIVE NEGATIVE  Hemoglobin A1c   Collection Time: 06/13/17  9:57 AM  Result Value Ref Range   Hgb A1c MFr Bld 5.4 4.8 - 5.6 %   Mean Plasma Glucose 108.28 mg/dL    Imaging Studies: 04/15/2017 CLINICAL DATA:  47 y/o F; Follow-up of left ovarian cyst. History of breast cancer 4 years ago.  EXAM: TRANSABDOMINAL AND TRANSVAGINAL ULTRASOUND OF PELVIS  DOPPLER ULTRASOUND OF OVARIES  TECHNIQUE: Both transabdominal and transvaginal ultrasound examinations of the pelvis were performed. Transabdominal technique was performed for global imaging of the pelvis including uterus, ovaries, adnexal regions, and pelvic cul-de-sac.  It was necessary to proceed with endovaginal exam following the transabdominal exam to visualize the adnexa. Color and duplex Doppler ultrasound was utilized to evaluate blood flow to the ovaries.  COMPARISON:  04/15/2017 CT abdomen and pelvis.  FINDINGS: Uterus  Measurements: 16 x 8.5 x 9.6 cm. Multiple uterine fibroids: Left fundal myometrium 6.5 cm, anterior mid subserosal 4.4 cm, right mid myometrial 6 cm, and additional smaller fibroma in the lower  uterine segments.  Endometrium  Thickness: 15 mm.  No focal abnormality visualized.  Right ovary  Measurements: 3.6 x 2.5 x 3.0 cm. Normal appearance/no adnexal mass.  Left ovary  Measurements: 7.5 x 3.8 x 5.3 cm. Avascular lesion measuring up to 5.8 cm with reticular internal echoes, likely hemorrhagic cysts.  Pulsed Doppler evaluation of both ovaries demonstrates normal low-resistance arterial and venous waveforms.  Other findings  Small volume of fluid surrounds the mildly complex left ovary.  IMPRESSION: 1. Left ovary cyst measuring up to 5.8 cm with reticular internal echoes, in the absence of pregnancy, this likely represents a hemorrhagic cyst. 6-12 week follow-up is recommended to ensure resolution. This recommendation follows the consensus statement: Management of Asymptomatic Ovarian and Other Adnexal Cysts Imaged at [place]: Society of Radiologists in [place]. Radiology 2010; [phone]. 2. Small volume of complex fluid surrounding left ovary may represent cyst rupture. 3. Myomatous uterus.   Assessment: Patient Active Problem List   Diagnosis Date Noted  . Pelvic pain in female 05/23/2017  . Plantar fasciitis, bilateral 01/03/2017  . Anemia 06/05/2013  . Menorrhagia 06/05/2013  . 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 Breast cancer of upper-outer quadrant of right female breast ([place]) 11/24/2012  . Nabothian cyst 12/07/2011  . Cervical mass 11/20/2011  . Fibroid uterus 11/20/2011    Plan: Patient will undergo surgical management with Robot assisted total laparoscopic hysterectomy with BSO.   The risks of surgery were discussed in detail with the patient including but not limited to: bleeding which may  require transfusion or reoperation; infection which may require antibiotics; injury to surrounding organs which may involve bowel, bladder, ureters ; need for additional procedures including laparoscopy or  laparotomy; thromboembolic phenomenon, surgical site problems and other postoperative/anesthesia complications. Likelihood of success in alleviating the patient's condition was discussed. Routine postoperative instructions will be reviewed with the patient and her family in detail after surgery.  The patient concurred with the proposed plan, giving informed written consent for the surgery.  Patient has been NPO since last night she will remain NPO for procedure.  Anesthesia and OR aware.  Preoperative prophylactic antibiotics and SCDs ordered on call to the OR.  To OR when ready.  Hiren Peplinski L. Ihor Dow, M.D., [facility] 06/18/2017 3:17 PM

## 2017-06-18 NOTE — Anesthesia Procedure Notes (Signed)
Procedure Name: Intubation Date/Time: 06/18/2017 3:49 PM Performed by: Raenette Rover, CRNA Pre-anesthesia Checklist: Patient identified, Emergency Drugs available, Suction available and Patient being monitored Patient Re-evaluated:Patient Re-evaluated prior to induction Oxygen Delivery Method: Circle system utilized Preoxygenation: Pre-oxygenation with 100% oxygen Induction Type: IV induction Ventilation: Mask ventilation without difficulty Laryngoscope Size: Mac and 3 Grade View: Grade I Tube type: Oral Tube size: 7.0 mm Number of attempts: 1 Airway Equipment and Method: Stylet Placement Confirmation: ETT inserted through vocal cords under direct vision,  positive ETCO2,  CO2 detector and breath sounds checked- equal and bilateral Secured at: 21 cm Tube secured with: Tape Dental Injury: Teeth and Oropharynx as per pre-operative assessment

## 2017-06-18 NOTE — Op Note (Signed)
06/18/2017  6:59 PM  PATIENT:  Sheryl Cox  47 y.o. female  PRE-OPERATIVE DIAGNOSIS:  Fibroids Menorrhagia Lft. Ovarian Cyst; history of breast cancer   POST-OPERATIVE DIAGNOSIS:  Fibroids Menorrhagia Lft. Ovarian Cyst; history of breast cancer  PROCEDURE:  Procedure(s): XI ROBOTIC ASSISTED TOTAL HYSTERECTOMY (N/A) BILATERAL SALPINGO OOPHORECTOMY (Bilateral)  SURGEON:  Surgeon(s) and Role:    * Lavonia Drafts, MD - Primary    * Constant, Peggy, MD - Assisting  ANESTHESIA:   general  EBL:  350 mL   BLOOD ADMINISTERED:none  DRAINS: none   LOCAL MEDICATIONS USED:  MARCAINE     SPECIMEN:  Source of Specimen:  uterus, cervix and bilateral fallopian tubes.   DISPOSITION OF SPECIMEN:  PATHOLOGY  COUNTS:  YES  TOURNIQUET:  * No tourniquets in log *  DICTATION: .Note written in [place]: Admit for overnight observation  PATIENT DISPOSITION:  PACU - hemodynamically stable.   Delay start of Pharmacological VTE agent (>24hrs) due to surgical blood loss or risk of bleeding: not applicable  Complications: none immediate   Indications: pt is a 47 yo female with a h/o a prev c-section with a h/o uterine fibroids and AUB. Pt had a h/o breat cancer and requested a BSO  I explained to her the risks of medical menopause and the challenge with controlling sx. Pt expressed understanding.    The risks, benefits, and alternatives of surgery were explained, understood, and accepted. Consents were signed. All questions were answered. She was taken to the operating room and general anesthesia was applied without complication. She was placed in the dorsal lithotomy position and her abdomen and vagina were prepped and draped after she had been carefully positioned on the table. A bimanual exam revealed a 16 week size uterus with irregular contour that was mobile. Her adnexa were not enlarged but, eval was limited by pts body habitus. The cervix was measured and the uterus  was sounded to 12 cm. A Rumi uterine manipulator was placed without difficulty. A Foley catheter was placed and it drained clear throughout the case. Gloves were changed and attention was turned to the abdomen. A 76mm incision was made in the umbilicus and a Veress needle was placed intraperitoneally. CO2 was used to insufflate the abdomen to approximately 4 L. After good pneumoperitoneum was established, a 8 mm trocar was placed in the umbilicus.  Laparoscopy confirmed correct placement. She was placed in Trendelenburg position and ports were placed in appropriate positions on her abdomen to allow maximum exposure during the robotic case. Specifically there was an 60mm assistant port placed in the left upper quadrant under direct laproscopic visualization. Two 8 mm ports were placed 8cm lateral to the midline port.  These were all placed under direct laparoscopic visualization. The robot was docked and I proceeded with a robotic portion of the case.  The pelvis was inspected and the uterus was found to have fibroids and be enlarged.  The fallopian tubes and ovaries were found to be normal. The remainder of her pelvis appeared normal with the exception of adhesions of bowel to the adnexa and sidewall on the left side.  These were released sharply. The ureters and the infundibulopelvic ligaments were identified. I excised the fallopian tubes bilaterally. The round ligament on each side was cauterized and cut. The PK/gyrus instrument was used for this portion. The round ligaments were identified, cauterized and ligated, a bladder flap was created anteriorly. The uterine vessels were identified and cauterized and then  cut.The bladder was pushed out of the operative site and an anterior colpotomy was made. The colpotomy incision was extended circumferentially, following the blue outline of the Rumi manipulator. All pedicles were hemostatic.  The uterus was large so had to be morcellated from the vagina to remove.   The  entire uterus with fallopian tubes and ovaries were removed.  The vaginal cuff was closed with v-lock suture.  Excellent hemostasis was noted throughout. The pelvis was irrigated. The intraabdominal pressure was lowered assess hemostasis. After determining excellent hemostasis, the robot was undocked. The skin from all of the other ports was closed with 3-0 vicryl  And Dermabond was applied. 30cc of 0.5% Marcaine was injected into the port sites.  The patient was then extubated and taken to recovery in stable condition.   Sponge, lap and needle counts were correct x 2.  Lorance Pickeral L. Harraway-Smith, M.D., Cherlynn June

## 2017-06-18 NOTE — Brief Op Note (Signed)
06/18/2017  6:59 PM  PATIENT:  [name]  47 y.o. female  PRE-OPERATIVE DIAGNOSIS:  Fibroids Menorrhagia Lft. Ovarian Cyst  POST-OPERATIVE DIAGNOSIS:  Fibroids Menorrhagia Lft. Ovarian Cyst  PROCEDURE:  Procedure(s): XI ROBOTIC ASSISTED TOTAL HYSTERECTOMY (N/A) BILATERAL SALPINGO OOPHORECTOMY (Bilateral)  SURGEON:  Surgeon(s) and Role:    * Lavonia Drafts, MD - Primary    * Constant, Peggy, MD - Assisting  ANESTHESIA:   general  EBL:  350 mL   BLOOD ADMINISTERED:none  DRAINS: none   LOCAL MEDICATIONS USED:  MARCAINE     SPECIMEN:  Source of Specimen:  uterus, cervix and bilateral fallopian tubes.   DISPOSITION OF SPECIMEN:  PATHOLOGY  COUNTS:  YES  TOURNIQUET:  * No tourniquets in log *  DICTATION: .Note written in [place]: Admit for overnight observation  PATIENT DISPOSITION:  PACU - hemodynamically stable.   Delay start of Pharmacological VTE agent (>24hrs) due to surgical blood loss or risk of bleeding: not applicable  Complications: none immediate   Josiah Wojtaszek L. Harraway-Smith, M.D., Cherlynn June

## 2017-06-18 NOTE — Anesthesia Preprocedure Evaluation (Addendum)
Anesthesia Evaluation  Patient identified by MRN, date of birth, ID band Patient awake    Reviewed: Allergy & Precautions, NPO status , Patient's Chart, lab work & pertinent test results  Airway Mallampati: I  TM Distance: >3 FB Neck ROM: Full    Dental  (+) Teeth Intact, Dental Advidsory Given   Pulmonary asthma (last inhaler over a month ago) ,    breath sounds clear to auscultation       Cardiovascular hypertension, Pt. on medications (-) angina Rhythm:Regular Rate:Normal     Neuro/Psych negative neurological ROS     GI/Hepatic Neg liver ROS, Symptomatic chole   Endo/Other  diabetes, Oral Hypoglycemic AgentsMorbid obesity  Renal/GU negative Renal ROS     Musculoskeletal  (+) Arthritis ,   Abdominal (+) + obese,   Peds  Hematology  (+) Blood dyscrasia (Hb 10.5), anemia ,   Anesthesia Other Findings   Reproductive/Obstetrics                            Anesthesia Physical  Anesthesia Plan  ASA: III  Anesthesia Plan: General   Post-op Pain Management:    Induction: Intravenous  PONV Risk Score and Plan: 3 and Ondansetron, Dexamethasone, Midazolam and Treatment may vary due to age or medical condition  Airway Management Planned: Oral ETT  Additional Equipment:   Intra-op Plan:   Post-operative Plan: Extubation in OR  Informed Consent: I have reviewed the patients History and Physical, chart, labs and discussed the procedure including the risks, benefits and alternatives for the proposed anesthesia with the patient or authorized representative who has indicated his/her understanding and acceptance.   Dental Advisory Given  Plan Discussed with: Anesthesiologist, CRNA and Surgeon  Anesthesia Plan Comments: (Plan routine monitors, GETA)       Anesthesia Quick Evaluation

## 2017-06-19 ENCOUNTER — Encounter (HOSPITAL_COMMUNITY): Payer: Self-pay | Admitting: Obstetrics & Gynecology

## 2017-06-19 DIAGNOSIS — D259 Leiomyoma of uterus, unspecified: Secondary | ICD-10-CM | POA: Diagnosis not present

## 2017-06-19 LAB — CBC
HCT: 28.5 % — ABNORMAL LOW (ref 36.0–46.0)
Hemoglobin: 9 g/dL — ABNORMAL LOW (ref 12.0–15.0)
MCH: 23.8 pg — AB (ref 26.0–34.0)
MCHC: 31.6 g/dL (ref 30.0–36.0)
MCV: 75.4 fL — ABNORMAL LOW (ref 78.0–100.0)
PLATELETS: 275 10*3/uL (ref 150–400)
RBC: 3.78 MIL/uL — AB (ref 3.87–5.11)
RDW: 16 % — ABNORMAL HIGH (ref 11.5–15.5)
WBC: 9.3 10*3/uL (ref 4.0–10.5)

## 2017-06-19 LAB — BASIC METABOLIC PANEL
Anion gap: 9 (ref 5–15)
BUN: 11 mg/dL (ref 6–20)
CO2: 25 mmol/L (ref 22–32)
Calcium: 8.6 mg/dL — ABNORMAL LOW (ref 8.9–10.3)
Chloride: 102 mmol/L (ref 101–111)
Creatinine, Ser: 0.75 mg/dL (ref 0.44–1.00)
GFR calc non Af Amer: >60 mL/min (ref >60)
Glucose, Bld: 126 mg/dL — ABNORMAL HIGH (ref 65–99)
POTASSIUM: 4.3 mmol/L (ref 3.5–5.1)
SODIUM: 136 mmol/L (ref 135–145)

## 2017-06-19 MED ORDER — DOCUSATE SODIUM 100 MG PO CAPS: 100.0000 mg | ORAL_CAPSULE | Freq: Two times a day (BID) | ORAL | 0 refills | Status: DC

## 2017-06-19 MED ORDER — HYDROCHLOROTHIAZIDE 12.5 MG PO TABS: 25.0000 mg | ORAL_TABLET | Freq: Every day | ORAL | 3 refills | Status: DC

## 2017-06-19 MED ORDER — KETOROLAC TROMETHAMINE 30 MG/ML IJ SOLN
INTRAMUSCULAR | Status: AC
  Filled 2017-06-19: qty 1

## 2017-06-19 MED ORDER — TRAMADOL HCL 50 MG PO TABS: 50.0000 mg | ORAL_TABLET | Freq: Four times a day (QID) | ORAL | 0 refills | Status: DC | PRN

## 2017-06-19 MED ORDER — OXYCODONE-ACETAMINOPHEN 5-325 MG PO TABS
ORAL_TABLET | ORAL | Status: AC
  Filled 2017-06-19: qty 1

## 2017-06-19 MED ORDER — IBUPROFEN 600 MG PO TABS: 600.0000 mg | ORAL_TABLET | Freq: Four times a day (QID) | ORAL | 0 refills | Status: DC | PRN

## 2017-06-19 MED ORDER — METFORMIN HCL 500 MG PO TABS: 500.0000 mg | ORAL_TABLET | Freq: Two times a day (BID) | ORAL | 2 refills | Status: AC

## 2017-06-19 MED ORDER — DOCUSATE SODIUM 100 MG PO CAPS
ORAL_CAPSULE | ORAL | Status: AC
  Filled 2017-06-19: qty 1

## 2017-06-19 NOTE — Discharge Instructions (Signed)

## 2017-06-19 NOTE — Discharge Summary (Signed)
Physician Discharge Summary  Patient ID: Sheryl Cox MRN: [id number] DOB/AGE: 48-Nov-1972 48 y.o.  Admit date: 06/18/2017 Discharge date: 06/19/2017  Admission Diagnoses: Uterine fibroids; AUB; h/o breast cancer  Discharge Diagnoses:  Principal Problem:   Fibroid uterus Active Problems:   Breast cancer of upper-outer quadrant of right female breast ([place])   Menorrhagia   Post-operative state   Discharged Condition: good  Hospital Course: Patient had an uncomplicated surgery; for further details of this surgery, please refer to the operative note. Furthermore, the patient had an uncomplicated postoperative course.  By time of discharge, her pain was controlled on oral pain medications; she was ambulating, voiding without difficulty, tolerating regular diet and passing flatus.  She was deemed stable for discharge to home.    Consults: None  Significant Diagnostic Studies: none  Treatments: surgery: Robot assisted total laparoscopic hysterectomy with bilateral salpingoophoerectomy  Discharge Exam: Blood pressure 138/82, pulse 76, temperature 99.9 F (37.7 C), resp. rate 14, height 5\' 3"  (1.6 m), weight 228 lb (103.4 kg), last menstrual period 05/27/2017, SpO2 100 %. General appearance: alert and no distress Resp: clear to auscultation bilaterally Cardio: regular rate and rhythm, S1, S2 normal, no murmur, click, rub or gallop GI: soft, non-tender; bowel sounds normal; no masses,  no organomegaly Extremities: extremities normal, atraumatic, no cyanosis or edema Incision/Wound: all port sites clean and dry.   Disposition: home or self care    Discharge Instructions    Call MD for:  difficulty breathing, headache or visual disturbances   Complete by:  As directed    Call MD for:  extreme fatigue   Complete by:  As directed    Call MD for:  hives   Complete by:  As directed    Call MD for:  persistant dizziness or light-headedness   Complete by:  As directed    Call MD for:   persistant nausea and vomiting   Complete by:  As directed    Call MD for:  redness, tenderness, or signs of infection (pain, swelling, redness, odor or green/yellow discharge around incision site)   Complete by:  As directed    Call MD for:  severe uncontrolled pain   Complete by:  As directed    Call MD for:  temperature >100.4   Complete by:  As directed    Diet - low sodium heart healthy   Complete by:  As directed    Diet Carb Modified   Complete by:  As directed    Discharge wound care:   Complete by:  As directed    Keep port sites clean and dry   Increase activity slowly   Complete by:  As directed      Allergies as of 06/19/2017      Reactions   Shrimp [shellfish Allergy] Anaphylaxis, Swelling   Aleve [naproxen] Hypertension   MD told the patient to not take this because it will elevate her B/P      Medication List    STOP taking these medications   tamoxifen 20 MG tablet Commonly known as:  NOLVADEX   tranexamic acid 650 MG Tabs tablet Commonly known as:  LYSTEDA     TAKE these medications   amLODipine 10 MG tablet Commonly known as:  NORVASC Take 10 mg by mouth daily.   docusate sodium 100 MG capsule Commonly known as:  COLACE Take 1 capsule (100 mg total) by mouth 2 (two) times daily.   hydrochlorothiazide 12.5 MG tablet Commonly known as:  HYDRODIURIL  Take 2 tablets (25 mg total) by mouth daily. What changed:  how much to take   ibuprofen 600 MG tablet Commonly known as:  ADVIL,MOTRIN Take 1 tablet (600 mg total) by mouth every 6 (six) hours as needed (mild pain). What changed:    medication strength  how much to take  reasons to take this   metFORMIN 500 MG tablet Commonly known as:  GLUCOPHAGE Take 1 tablet (500 mg total) by mouth 2 (two) times daily with a meal. What changed:  when to take this   MULTIVITAMIN PO Take 1 tablet by mouth daily.   PROAIR HFA 108 (90 Base) MCG/ACT inhaler Generic drug:  albuterol Inhale 2 puffs into the  lungs every 6 (six) hours as needed for shortness of breath.   traMADol 50 MG tablet Commonly known as:  ULTRAM Take 1 tablet (50 mg total) by mouth every 6 (six) hours as needed.            Discharge Care Instructions  (From admission, onward)        Start     Ordered   06/19/17 0000  Discharge wound care:    Comments:  Keep port sites clean and dry   06/19/17 0921     Follow-up Information    [facility] Follow up in 2 week(s).   Why:  Dr. Evans Lance information: [address] [place] [phone]          Signed: Lavonia Drafts 06/19/2017, 6:23 PM

## 2017-06-24 ENCOUNTER — Encounter (HOSPITAL_COMMUNITY): Payer: Self-pay

## 2017-07-04 ENCOUNTER — Encounter: Payer: Self-pay | Admitting: Obstetrics & Gynecology

## 2017-07-04 ENCOUNTER — Ambulatory Visit (INDEPENDENT_AMBULATORY_CARE_PROVIDER_SITE_OTHER): Payer: Medicaid Other | Admitting: Obstetrics & Gynecology

## 2017-07-04 VITALS — BP 128/88 | HR 96 | Wt 223.0 lb

## 2017-07-04 DIAGNOSIS — Z9889 Other specified postprocedural states: Secondary | ICD-10-CM

## 2017-07-04 NOTE — Progress Notes (Signed)
FYI: Pt's mother passed away last night. Pt has no complaints with surgery

## 2017-07-04 NOTE — Progress Notes (Signed)
History:  47 y.o. G1P1001 here today for 2 week post op check. Pt is s/p RATH with BSO.   Pts mother died last evening.  Pt denies post op problems. She is voiding and passing stool without problems. She denies bleeding or pain.   The following portions of the patient's history were reviewed and updated as appropriate: allergies, current medications, past family history, past medical history, past social history, past surgical history and problem list.  Review of Systems:  Pertinent items are noted in HPI.    Objective:  Physical Exam BP 128/88   Pulse 96   Wt 223 lb (101.2 kg)   BMI 39.50 kg/m   CONSTITUTIONAL: Well-developed, well-nourished female in no acute distress.  HENT:  Normocephalic, atraumatic EYES: Conjunctivae and EOM are normal. No scleral icterus.  NECK: Normal range of motion SKIN: Skin is warm and dry. No rash noted. Not diaphoretic.No pallor. [place]: Alert and oriented to person, place, and time. Normal coordination.   Abd: Soft, nontender and nondistended Pelvic: Normal appearing external genitalia; normal appearing vaginal mucosa and cervix.  Normal discharge.  Small uterus, no other palpable masses, no uterine or adnexal tenderness  Labs and Imaging 06/18/2017 Diagnosis Uterus, ovaries and fallopian tubes - UTERUS: -ENDOMETRIUM: SECRETORY ENDOMETRIUM. NO HYPERPLASIA OR MALIGNANCY. -MYOMETRIUM: LEIOMYOMATA. NO MALIGNANCY. -SEROSA: UNREMARKABLE. NO MALIGNANCY. - CERVIX: BENIGN SQUAMOUS AND ENDOCERVICAL MUCOSA. NO DYSPLASIA OR MALIGNANCY. - BILATERAL OVARIES: CYSTIC FOLLICLE. HEMORRHAGIC CYST. HEMORRHAGIC CORPUS LUTEUM. NO MALIGNANCY. - BILATERAL FALLOPIAN TUBES: UNREMARKABLE. NO MALIGNANCY  Assessment & Plan:  2 week post op visit.  Reviewed surg path Gradual increase in activity.  Nothing in vagina for 8 full weeks post op Follow up in 4 weeks or sooner prn  Daquon Greenleaf L. Harraway-Smith, M.D., Cherlynn June

## 2017-07-04 NOTE — Patient Instructions (Signed)

## 2017-08-05 ENCOUNTER — Encounter: Payer: Self-pay | Admitting: *Deleted

## 2017-08-05 ENCOUNTER — Encounter: Payer: Self-pay | Admitting: Obstetrics & Gynecology

## 2017-08-05 ENCOUNTER — Ambulatory Visit (INDEPENDENT_AMBULATORY_CARE_PROVIDER_SITE_OTHER): Payer: Medicaid Other | Admitting: Obstetrics & Gynecology

## 2017-08-05 VITALS — BP 139/102 | HR 105 | Ht 63.0 in | Wt 229.0 lb

## 2017-08-05 DIAGNOSIS — Z9889 Other specified postprocedural states: Secondary | ICD-10-CM

## 2017-08-05 NOTE — Progress Notes (Signed)
Complaining of one of the incision sites draining yellow discharge. Kathrene Alu RN

## 2017-08-05 NOTE — Progress Notes (Signed)
History:  47 y.o. G1P1001 here today for 6 week post op check. She reports occ pain in abd. No abnormal discharge or bleeding. Normal bowel and bladder function.   The following portions of the patient's history were reviewed and updated as appropriate: allergies, current medications, past family history, past medical history, past social history, past surgical history and problem list.  Review of Systems:  Pertinent items are noted in HPI.    Objective:  Physical Exam Blood pressure (!) 139/102, pulse (!) 105, height 5\' 3"  (1.6 m), weight 229 lb (103.9 kg).  CONSTITUTIONAL: Well-developed, well-nourished female in no acute distress.  HENT:  Normocephalic, atraumatic EYES: Conjunctivae and EOM are normal. No scleral icterus.  NECK: Normal range of motion SKIN: Skin is warm and dry. No rash noted. Not diaphoretic.No pallor. [place]: Alert and oriented to person, place, and time. Normal coordination.  Abd: Soft, nontender and nondistended; port sites well healed.  Pelvic: Normal appearing external genitalia; normal appearing vaginal mucosa and vaginal cuff.   Normal discharge.  No palpable masses or adnexal tenderness  Labs and Imaging 06/18/2017 Diagnosis Uterus, ovaries and fallopian tubes - UTERUS: -ENDOMETRIUM: SECRETORY ENDOMETRIUM. NO HYPERPLASIA OR MALIGNANCY. -MYOMETRIUM: LEIOMYOMATA. NO MALIGNANCY. -SEROSA: UNREMARKABLE. NO MALIGNANCY. - CERVIX: BENIGN SQUAMOUS AND ENDOCERVICAL MUCOSA. NO DYSPLASIA OR MALIGNANCY. - BILATERAL OVARIES: CYSTIC FOLLICLE. HEMORRHAGIC CYST. HEMORRHAGIC CORPUS LUTEUM. NO MALIGNANCY. - BILATERAL FALLOPIAN TUBES: UNREMARKABLE. NO MALIGNANCY.  Assessment & Plan:  Pt is doing well post op F/u in 3 months or sooner prn Full return of activities  May begin intercourse in 2 more weeks  Verginia Toohey L. Harraway-Smith, M.D., Cherlynn June

## 2017-08-08 ENCOUNTER — Encounter (HOSPITAL_COMMUNITY): Payer: Self-pay | Admitting: Obstetrics & Gynecology

## 2017-09-10 ENCOUNTER — Telehealth: Payer: Self-pay | Admitting: Hematology and Oncology

## 2017-09-10 ENCOUNTER — Inpatient Hospital Stay: Payer: Medicaid Other | Attending: Hematology and Oncology | Admitting: Hematology and Oncology

## 2017-09-10 DIAGNOSIS — D0511 Intraductal carcinoma in situ of right breast: Secondary | ICD-10-CM | POA: Insufficient documentation

## 2017-09-10 DIAGNOSIS — Z170 Estrogen receptor positive status [ER+]: Secondary | ICD-10-CM | POA: Insufficient documentation

## 2017-09-10 DIAGNOSIS — D509 Iron deficiency anemia, unspecified: Secondary | ICD-10-CM | POA: Insufficient documentation

## 2017-09-10 DIAGNOSIS — E669 Obesity, unspecified: Secondary | ICD-10-CM | POA: Insufficient documentation

## 2017-09-10 DIAGNOSIS — C50411 Malignant neoplasm of upper-outer quadrant of right female breast: Secondary | ICD-10-CM

## 2017-09-10 MED ORDER — TAMOXIFEN CITRATE 20 MG PO TABS: 20.0000 mg | ORAL_TABLET | Freq: Every day | ORAL | 3 refills | Status: DC

## 2017-09-10 NOTE — Progress Notes (Signed)
Patient Care Team: Lucianne Lei, MD as PCP - General (Family Medicine)  DIAGNOSIS:  Encounter Diagnosis  Name Primary?  . Malignant neoplasm of upper-outer quadrant of right breast in female, estrogen receptor positive ([place])     SUMMARY OF ONCOLOGIC HISTORY:   Breast cancer of upper-outer quadrant of right female breast ([place])   11/06/2012 Mammogram     Numerous calcifications appear both linear in morphology and linear in distribution and extend over a large portion of the breast.  The longest area is 6.0 x 1.2 cm.  A second area is 6.0 x 1.4 cm.  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      11/26/2012 Breast MRI    upper inner quadrant of the posterior left breast, there is an 8 x 7 x 7 mm circumscribed enhancing mass;       03/25/2013 - 05/02/2013 Radiation Therapy    50 Gy to the right breast       05/19/2013 -  Anti-estrogen oral therapy    Tamoxifen 20 mg daily      02/23/2014 Surgery    Right Lumpectomy: At [facility]; DCIS, ER/PR Positive      06/26/2017 Surgery    Hysterectomy with BSO       CHIEF COMPLIANT: Follow-up on tamoxifen therapy  INTERVAL HISTORY: Sheryl Cox is a 46 year old with above-mentioned his right breast DCIS who underwent lumpectomy and is currently on tamoxifen.  She appears to be tolerating tamoxifen fairly well.  She denies any hot flashes or myalgias.  She denies any lumps or nodules in the breast.  She follows with her surgeon at [facility]:   Constitutional: Denies fevers, chills or abnormal weight loss Eyes: Denies blurriness of vision Ears, nose, mouth, throat, and face: Denies mucositis or sore throat Respiratory: Denies cough, dyspnea or wheezes Cardiovascular: Denies palpitation, chest  discomfort Gastrointestinal:  Denies nausea, heartburn or change in bowel habits Skin: Denies abnormal skin rashes Lymphatics: Denies new lymphadenopathy or easy bruising Neurological:Denies numbness, tingling or new weaknesses Behavioral/Psych: Mood is stable, no new changes  Extremities: No lower extremity edema Breast:  denies any pain or lumps or nodules in either breasts All other systems were reviewed with the patient and are negative.  I have reviewed the past medical history, past surgical history, social history and family history with the patient and they are unchanged from previous note.  ALLERGIES:  is allergic to shrimp [shellfish allergy] and aleve [naproxen].  MEDICATIONS:  Current Outpatient Medications  Medication Sig Dispense Refill  . albuterol (PROAIR HFA) 108 (90 BASE) MCG/ACT inhaler Inhale 2 puffs into the lungs every 6 (six) hours as needed for shortness of breath.     Marland Kitchen amLODipine (NORVASC) 10 MG tablet Take 10 mg by mouth daily.  3  . docusate sodium (COLACE) 100 MG capsule Take 1 capsule (100 mg total) by mouth 2 (two) times daily. 10 capsule 0  . hydrochlorothiazide (HYDRODIURIL) 12.5 MG tablet Take 2 tablets (25 mg total) by mouth daily. 30 tablet 3  . ibuprofen (ADVIL,MOTRIN) 600 MG tablet Take 1 tablet (600 mg total) by mouth every 6 (six) hours as needed (mild pain). 30 tablet 0  . metFORMIN (GLUCOPHAGE) 500 MG tablet Take 1 tablet (500 mg total) by  mouth 2 (two) times daily with a meal. 60 tablet 2  . Multiple Vitamins-Minerals (MULTIVITAMIN PO) Take 1 tablet by mouth daily.    . tamoxifen (NOLVADEX) 20 MG tablet Take 1 tablet (20 mg total) by mouth daily. 90 tablet 3  . traMADol (ULTRAM) 50 MG tablet Take 1 tablet (50 mg total) by mouth every 6 (six) hours as needed. 30 tablet 0   No current facility-administered medications for this visit.     PHYSICAL EXAMINATION: ECOG PERFORMANCE STATUS: 1 - Symptomatic but completely ambulatory  Vitals:    09/10/17 1359  BP: (!) 134/96  Pulse: (!) 104  Resp: 18  Temp: 98.4 F (36.9 C)  SpO2: 100%   Filed Weights   09/10/17 1359  Weight: 232 lb (105.2 kg)    GENERAL:alert, no distress and comfortable SKIN: skin color, texture, turgor are normal, no rashes or significant lesions EYES: normal, Conjunctiva are pink and non-injected, sclera clear OROPHARYNX:no exudate, no erythema and lips, buccal mucosa, and tongue normal  NECK: supple, thyroid normal size, non-tender, without nodularity LYMPH:  no palpable lymphadenopathy in the cervical, axillary or inguinal LUNGS: clear to auscultation and percussion with normal breathing effort HEART: regular rate & rhythm and no murmurs and no lower extremity edema ABDOMEN:abdomen soft, non-tender and normal bowel sounds MUSCULOSKELETAL:no cyanosis of digits and no clubbing  NEURO: alert & oriented x 3 with fluent speech, no focal motor/sensory deficits EXTREMITIES: No lower extremity edema BREAST: No palpable masses or nodules in either right or left breasts. No palpable axillary supraclavicular or infraclavicular adenopathy no breast tenderness or nipple discharge. (exam performed in the presence of a chaperone)  LABORATORY DATA:  I have reviewed the data as listed CMP Latest Ref Rng & Units 06/19/2017 06/13/2017 04/15/2017  Glucose 65 - 99 mg/dL 126(H) 126(H) 103(H)  BUN 6 - 20 mg/dL 11 14 17   Creatinine 0.44 - 1.00 mg/dL 0.75 0.78 0.77  Sodium 135 - 145 mmol/L 136 141 137  Potassium 3.5 - 5.1 mmol/L 4.3 3.7 3.6  Chloride 101 - 111 mmol/L 102 100(L) 100(L)  CO2 22 - 32 mmol/L 25 29 24   Calcium 8.9 - 10.3 mg/dL 8.6(L) 9.6 9.4  Total Protein 6.5 - 8.1 g/dL - 7.6 7.1  Total Bilirubin 0.3 - 1.2 mg/dL - 0.5 0.5  Alkaline Phos 38 - 126 U/L - 30(L) 29(L)  AST 15 - 41 U/L - 19 17  ALT 14 - 54 U/L - 13(L) 13(L)    Lab Results  Component Value Date   WBC 9.3 06/19/2017   HGB 9.0 (L) 06/19/2017   HCT 28.5 (L) 06/19/2017   MCV 75.4 (L) 06/19/2017    PLT 275 06/19/2017   NEUTROABS 9.7 (H) 12/18/2015    ASSESSMENT & PLAN:  Breast cancer of upper-outer quadrant of right female breast ([place]) 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 Hypertension: On blood pressure medication. Follows with her primary care physician. She recently had total hysterectomy with bilateral salpingo-nephrectomy. I recommended that she continue tamoxifen therapy until March 2020.  Surveillance:  Mammogram at [facility]  November 2018 is normal. She also follows up with her surgeon and with radiation oncologist. Breast exam 09/10/2017: Benign  Microcytic anemia: Did not agree to do any blood work with [place].  She follows with her primary care physician. Obesity: I gave her information on the weight loss clinic.  Return  to clinic in 1 year for follow-up    No orders of the defined types were placed in this encounter.  The patient has a good understanding of the overall plan. she agrees with it. she will call with any problems that may develop before the next visit here.   Harriette Ohara, MD 09/10/17

## 2017-09-10 NOTE — Assessment & Plan Note (Signed)
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 Hypertension: On blood pressure medication. Follows with her primary care physician.  Surveillance:  Mammogram at [place]  November 2018 is normal. She also follows up with her surgeon and with radiation oncologist. Breast exam 09/10/2017: Benign  Microcytic anemia: Did not agree to do any blood work with [place].  She follows with her primary care physician.  Return to clinic in 1 year for follow-up

## 2017-09-10 NOTE — Telephone Encounter (Signed)
Gave avs and calendar ° °

## 2017-12-09 ENCOUNTER — Emergency Department (HOSPITAL_COMMUNITY)
Admission: EM | Admit: 2017-12-09 | Discharge: 2017-12-09 | Disposition: A | Discharge status: Home / Self Care | Payer: Medicaid Other | Attending: Emergency Medicine | Admitting: Emergency Medicine

## 2017-12-09 ENCOUNTER — Other Ambulatory Visit: Payer: Self-pay

## 2017-12-09 ENCOUNTER — Emergency Department (HOSPITAL_COMMUNITY): Payer: Medicaid Other

## 2017-12-09 ENCOUNTER — Encounter (HOSPITAL_COMMUNITY): Payer: Self-pay | Admitting: *Deleted

## 2017-12-09 DIAGNOSIS — Z7984 Long term (current) use of oral hypoglycemic drugs: Secondary | ICD-10-CM | POA: Insufficient documentation

## 2017-12-09 DIAGNOSIS — H43393 Other vitreous opacities, bilateral: Secondary | ICD-10-CM | POA: Insufficient documentation

## 2017-12-09 DIAGNOSIS — M25511 Pain in right shoulder: Secondary | ICD-10-CM | POA: Insufficient documentation

## 2017-12-09 DIAGNOSIS — Z853 Personal history of malignant neoplasm of breast: Secondary | ICD-10-CM | POA: Insufficient documentation

## 2017-12-09 DIAGNOSIS — Z79899 Other long term (current) drug therapy: Secondary | ICD-10-CM | POA: Insufficient documentation

## 2017-12-09 DIAGNOSIS — E119 Type 2 diabetes mellitus without complications: Secondary | ICD-10-CM | POA: Insufficient documentation

## 2017-12-09 DIAGNOSIS — I10 Essential (primary) hypertension: Secondary | ICD-10-CM | POA: Insufficient documentation

## 2017-12-09 DIAGNOSIS — J45909 Unspecified asthma, uncomplicated: Secondary | ICD-10-CM | POA: Insufficient documentation

## 2017-12-09 NOTE — ED Triage Notes (Signed)
The pt has rt shoulder pain for one week after lifting boxes at work  And for one week also she has been seeing dots and lines in her vision lmp none

## 2017-12-09 NOTE — Discharge Instructions (Signed)
1. Medications: Alternate 600 mg of ibuprofen and [phone] mg of Tylenol every 3 hours as needed for pain. Do not exceed 4000 mg of Tylenol daily.  Take ibuprofen with food to avoid upset stomach issues.  2. Treatment: rest, ice, elevate, drink plenty of fluids, gentle stretching (I have attached exercises but you can also YouTube shoulder physical therapy exercises and do the exercises at least once a day) 3. Follow Up: Please followup with orthopedics as directed or your PCP in 1 week if no improvement for discussion of your diagnoses and further evaluation after today's visit; also follow-up with occupational health (I have attached information for follow-up at different [facility] facilities) for reevaluation of your shoulder pain and to establish your restrictions for work.  Follow up with an ophthalmologist or optometrist for reevaluation of the floaters in your eye.  Return to the emergency department immediately if any concerning signs or symptoms develop such as vision loss, headaches, fevers.   Please return to the ER for worsening symptoms or other concerns such as worsening swelling, redness of the skin, fevers, loss of pulses, or loss of feeling  If your blood pressure (BP) was elevated on multiple readings during this visit above 130 for the top number or above 80 for the bottom number, please have this repeated by your primary care provider within one month. You can also check your blood pressure when you are out at a pharmacy or grocery store. Many have machines that will check your blood pressure.  If your blood pressure remains elevated, please follow-up with your PCP.

## 2017-12-09 NOTE — ED Provider Notes (Signed)
[facility] EMERGENCY DEPARTMENT Provider Note   CSN: [id number] Arrival date & time: 12/09/17  1448     History   Chief Complaint Chief Complaint  Patient presents with  . Shoulder Pain  . lines in her eyes    HPI Sheryl Cox is a 47 y.o. female with history of anemia, arthritis, asthma, breast cancer, diabetes mellitus, migraines, hypertension presents for evaluation of acute onset, intermittent right shoulder pain for 1 week and acute onset, constant "seeing spots "in her vision for 1 week.  She noticed the pain in her shoulder after lifting boxes at work.  Pain is intermittent, localized to the anterior aspect of the right shoulder and radiates down the right upper arm.  Pain is sharp when she abducts or flexes her left shoulder.  Denies numbness or weakness.  No fevers, no history of IV drug use.  Has taken ibuprofen with mild temporary relief of her symptoms.  She has noted "seeing black lines and dots" bilaterally for 1 week.  She also notes blurry vision which she states is worse on the right.  Denies headaches, eye pain, eye trauma, or photophobia.  Has not tried anything for her symptoms.  The history is provided by the patient.    Past Medical History:  Diagnosis Date  . Anemia    history of blood transfusion-no abnormal reaction noted  . Arthritis   . Asthma    Albuterol as needed  . 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 Diabetes mellitus without complication ([place])    takes Metformin daily  . History of migraine    several yrs ago  . Hypertension    takes Tribenzor daily  . Joint pain     Patient Active Problem List   Diagnosis Date Noted  . Post-operative state 06/18/2017  . Pelvic pain in female 05/23/2017  . Plantar fasciitis, bilateral 01/03/2017  . Anemia 06/05/2013  . Menorrhagia 06/05/2013  . 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 Breast cancer of upper-outer quadrant of right female  breast ([place]) 11/24/2012  . Nabothian cyst 12/07/2011  . Cervical mass 11/20/2011  . Fibroid uterus 11/20/2011    Past Surgical History:  Procedure Laterality Date  . BIOPSY BREAST Left 12/05/12   fibroadenoma  . BREAST LUMPECTOMY     2014  . BREAST SURGERY Right   . CESAREAN SECTION  01/05/2009  . CHOLECYSTECTOMY N/A 01/31/2016   Procedure: LAPAROSCOPIC CHOLECYSTECTOMY;  Surgeon: Erroll Luna, MD;  Location: [facility];  Service: General;  Laterality: N/A;  . DILITATION & CURRETTAGE/HYSTROSCOPY WITH HYDROTHERMAL ABLATION N/A 08/12/2013   Procedure: DILATATION & CURETTAGE/HYSTEROSCOPY WITH HYDROTHERMAL ABLATION;  Surgeon: Frederico Hamman, MD;  Location: [facility];  Service: Gynecology;  Laterality: N/A;  . ROBOTIC ASSISTED TOTAL HYSTERECTOMY N/A 06/18/2017   Procedure: XI ROBOTIC ASSISTED TOTAL HYSTERECTOMY;  Surgeon: Lavonia Drafts, MD;  Location: [facility];  Service: Gynecology;  Laterality: N/A;  . SALPINGOOPHORECTOMY Bilateral 06/18/2017   Procedure: BILATERAL SALPINGO OOPHORECTOMY;  Surgeon: Lavonia Drafts, MD;  Location: [facility];  Service: Gynecology;  Laterality: Bilateral;     OB History    Gravida  1   Para  1   Term  1   Preterm  0   AB  0   Living  1     SAB  0   TAB  0   Ectopic  0   Multiple  0   Live Births  1  Home Medications    Prior to Admission medications   Medication Sig Start Date End Date Taking? Authorizing Provider  albuterol (PROAIR HFA) 108 (90 BASE) MCG/ACT inhaler Inhale 2 puffs into the lungs every 6 (six) hours as needed for shortness of breath.    Yes [provider]  amLODipine (NORVASC) 10 MG tablet Take 10 mg by mouth daily. 02/23/17  Yes [provider]  docusate sodium (COLACE) 100 MG capsule Take 1 capsule (100 mg total) by mouth 2 (two) times daily. 06/19/17  Yes Lavonia Drafts, MD  hydrochlorothiazide (HYDRODIURIL) 12.5 MG tablet Take 2 tablets (25 mg total) by mouth daily.  06/19/17  Yes Lavonia Drafts, MD  ibuprofen (ADVIL,MOTRIN) 200 MG tablet Take 400 mg by mouth every 6 (six) hours as needed for moderate pain.   Yes [provider]  metFORMIN (GLUCOPHAGE) 500 MG tablet Take 1 tablet (500 mg total) by mouth 2 (two) times daily with a meal. 06/19/17  Yes Harraway-Smith, Hoyle Sauer, MD  Multiple Vitamins-Minerals (MULTIVITAMIN PO) Take 1 tablet by mouth daily.   Yes [provider]  tamoxifen (NOLVADEX) 20 MG tablet Take 1 tablet (20 mg total) by mouth daily. 09/10/17  Yes Nicholas Lose, MD  traMADol (ULTRAM) 50 MG tablet Take 1 tablet (50 mg total) by mouth every 6 (six) hours as needed. 06/19/17  Yes Lavonia Drafts, MD  ibuprofen (ADVIL,MOTRIN) 600 MG tablet Take 1 tablet (600 mg total) by mouth every 6 (six) hours as needed (mild pain). Patient not taking: Reported on 12/09/2017 06/19/17   Lavonia Drafts, MD    Family History Family History  Problem Relation Age of Onset  . Hypertension Mother   . Diabetes Mother   . Breast cancer Paternal Grandmother        dx in her 66s  . Prostate cancer Paternal Grandfather   . Prostate cancer Father 31  . Prostate cancer Paternal Uncle   . Breast cancer Other        paternal grandmother's sister  . Anesthesia problems Neg Hx     Social History Social History   Tobacco Use  . Smoking status: Never Smoker  . Smokeless tobacco: Never Used  Substance Use Topics  . Alcohol use: Yes    Alcohol/week: 1.0 standard drinks    Types: 1 Cans of beer per week    Comment: occa  . Drug use: No     Allergies   Shrimp [shellfish allergy] and Aleve [naproxen]   Review of Systems Review of Systems  Eyes: Positive for visual disturbance. Negative for photophobia, pain and redness.  Respiratory: Negative for shortness of breath.   Cardiovascular: Negative for chest pain.  Musculoskeletal: Positive for arthralgias.  Neurological: Negative for syncope, weakness, numbness and headaches.   All other systems reviewed and are negative.    Physical Exam Updated Vital Signs BP (!) 151/97   Pulse 95   Temp 98.8 F (37.1 C)   Resp 18   Ht 5\' 3"  (1.6 m)   Wt 107 kg   LMP 05/27/2017 (Approximate)   SpO2 96%   BMI 41.81 kg/m   Physical Exam  Constitutional: She appears well-developed and well-nourished. No distress.  HENT:  Head: Normocephalic and atraumatic.  Eyes: Pupils are equal, round, and reactive to light. Conjunctivae and EOM are normal. Right eye exhibits no discharge. Left eye exhibits no discharge. No scleral icterus.  No chemosis, proptosis, or consensual photophobia.  No conjunctival injection.  Funduscopic examination limited secondary to constricted pupils.  Visual Acuity  Right Eye Distance: (20/20) Left Eye Distance: (20/20)    Neck: Normal range of motion. Neck supple. No JVD present. No tracheal deviation present.  Cardiovascular: Normal rate, regular rhythm and normal heart sounds.  Pulmonary/Chest: Effort normal and breath sounds normal.  Abdominal: Soft. Bowel sounds are normal. She exhibits no distension. There is no tenderness.  Musculoskeletal: She exhibits no edema.  Tenderness to palpation overlying the right acromioclavicular joint.  Normal active and passive range of motion of the right shoulder with pain elicited with abduction and flexion greater than 90 degrees.  Positive empty can sign.  Negative Hawkins impingement test.  5/5 strength of BUE and BLE major muscle groups  Neurological: She is alert. No cranial nerve deficit or sensory deficit. She exhibits normal muscle tone.  Mental Status:  Alert, thought content appropriate, able to give a coherent history. Speech fluent without evidence of aphasia. Able to follow 2 step commands without difficulty.  Cranial Nerves:  II:  Peripheral visual fields grossly normal, pupils equal, round, reactive to light III,IV, VI: ptosis not present, extra-ocular motions intact bilaterally  V,VII:  smile symmetric, facial light touch sensation equal VIII: hearing grossly normal to voice  X: uvula elevates symmetrically  XI: bilateral shoulder shrug symmetric and strong XII: midline tongue extension without fassiculations Motor:  Normal tone. 5/5 strength of BUE and BLE major muscle groups including strong and equal grip strength and dorsiflexion/plantar flexion Sensory: light touch normal in all extremities. Gait: normal gait and balance.   Skin: Skin is warm and dry. No erythema.  Psychiatric: She has a normal mood and affect. Her behavior is normal.  Nursing note and vitals reviewed.    ED Treatments / Results  Labs (all labs ordered are listed, but only abnormal results are displayed) Labs Reviewed - No data to display  EKG None  Radiology Dg Shoulder Right  Result Date: 12/09/2017 CLINICAL DATA:  47 year old female pulled right shoulder 3 days ago. Pain top and anterior aspect right shoulder. Initial encounter. EXAM: RIGHT SHOULDER - 2+ VIEW COMPARISON:  None. FINDINGS: No fracture or dislocation. Very mild acromioclavicular joint degenerative changes. No abnormal soft tissue calcifications. Minimally low-lying humeral head may be related to projection. Visualized lungs clear. IMPRESSION: 1.  No fracture or dislocation. 2. Very mild acromioclavicular joint degenerative changes. Electronically Signed   By: Genia Del M.D.   On: 12/09/2017 07:49    Procedures Procedures (including critical care time) EMERGENCY DEPARTMENT [place] OCULAR EXAM "Study: Limited Ultrasound of Orbit "  INDICATIONS: Floaters/Flashes Linear probe utilized to obtain images in both long and short axis of the orbit having the patient look left and right if possible.  PERFORMED BY: Myself IMAGES ARCHIVED?: Yes LIMITATIONS: none VIEWS USED: Right orbit and Left orbit INTERPRETATION: No retinal detachment, Lens in proper position, Normal optic nerve diameter   Medications Ordered in  ED Medications - No data to display   Initial Impression / Assessment and Plan / ED Course  I have reviewed the triage vital signs and the nursing notes.  Pertinent labs & imaging results that were available during my care of the patient were reviewed by me and considered in my medical decision making (see chart for details).     Patient with acute onset of right shoulder pain after heavy lifting at work for 1 week.  Also notes floaters bilaterally for 1 week.  She is afebrile, vital signs are stable.  She is nontoxic in appearance.  She is neurovascularly intact.  Radiographs of the shoulder show no acute osseous abnormality but mild acromioclavicular joint degenerative changes.  History and physical examination suggestive of possible rotator cuff pathology.  No evidence of septic joint, osteomyelitis, or DVT.  Visual acuity equal bilaterally.  No evidence of ocular nerve entrapment.  Doubt glaucoma.  Bedside ultrasound performed with no evidence of retinal detachment.  No focal neurologic deficits.  Doubt CVA.  No further emergent work-up required at this time.  Stable for discharge home with follow-up with orthopedist for reevaluation of shoulder pain and follow-up with optometrist or ophthalmologist for reevaluation of floaters. RICE therapy indicated and discussed. Discussed strict ED return precautions. Pt verbalized understanding of and agreement with plan and is safe for discharge home at this time.  Patient seen and evaluated by Dr. Vanita Panda who agrees with assessment and plan at this time.  Final Clinical Impressions(s) / ED Diagnoses   Final diagnoses:  Acute pain of right shoulder  Floaters in visual field, bilateral    ED Discharge Orders    None       Debroah Baller 12/09/17 1611    Carmin Muskrat, MD 12/14/17 0021

## 2017-12-10 ENCOUNTER — Encounter (INDEPENDENT_AMBULATORY_CARE_PROVIDER_SITE_OTHER): Payer: Self-pay | Admitting: Ophthalmology

## 2017-12-10 ENCOUNTER — Ambulatory Visit (INDEPENDENT_AMBULATORY_CARE_PROVIDER_SITE_OTHER): Payer: Self-pay | Admitting: Ophthalmology

## 2017-12-10 DIAGNOSIS — I10 Essential (primary) hypertension: Secondary | ICD-10-CM

## 2017-12-10 DIAGNOSIS — H3581 Retinal edema: Secondary | ICD-10-CM

## 2017-12-10 DIAGNOSIS — E119 Type 2 diabetes mellitus without complications: Secondary | ICD-10-CM

## 2017-12-10 DIAGNOSIS — H43811 Vitreous degeneration, right eye: Secondary | ICD-10-CM

## 2017-12-10 DIAGNOSIS — H2513 Age-related nuclear cataract, bilateral: Secondary | ICD-10-CM

## 2017-12-10 DIAGNOSIS — H5213 Myopia, bilateral: Secondary | ICD-10-CM

## 2017-12-10 DIAGNOSIS — H35033 Hypertensive retinopathy, bilateral: Secondary | ICD-10-CM

## 2017-12-10 NOTE — Progress Notes (Signed)
[facility] Note  12/10/2017     CHIEF COMPLAINT Patient presents for Flashes/floaters   HISTORY OF PRESENT ILLNESS: Sheryl Cox is a 47 y.o. female who presents to the clinic today for:   HPI    Flashes/floaters    In both eyes.  This started 1 week ago.  Duration Constant.  Characterized as small and spots.  Since onset it is gradually worsening.  Associated Symptoms Floaters and Pain.  Negative for Flashes, Blind Spot, Photophobia, Scalp Tenderness, Fever, Weight Loss, Jaw Claudication, Glare, Distortion, Redness, Trauma, Shoulder/Hip pain and Fatigue.  Context:  distance vision, mid-range vision and near vision.  Context: history of migraines.  Treatments tried include no treatments.  I, the attending physician,  performed the HPI with the patient and updated documentation appropriately.          Comments    Pt presents for ED f/u for floaters OU, pt states she started noticing floaters last week, she states they are there all the time and seem to have gotten worse since she first noticed them, pt states she was told in the ED that her blood pressure was up, pt states she had an ultrasound and eye test in the ED and both were good, pt denies sx or recent trauma to the head, but states she does have a hx of migraines, she states that [facility] did a CT scan and didn't find anything wrong, pt is diabetic, she states her blood sugar has been under control recently, but states she is out of her medication and has been told she cannot get a refill until she sees the dr next week, pts father had an eye sx were he had to have a gas bubble and pts mother is current pt of Dr. Tempie Hoist and receives injxns from him       Last edited by Bernarda Caffey, MD on 12/10/2017  2:05 PM. (History)    Pt states she was seen in ED x 1 day ago; Pt states she wears specs at night; Pt states she went to ED for floaters OU, states they appear to be "little brown squiggly  lines"; Pt endorses dx of DM, pt states DM is under good control; Pt does not recall last A1C measurement; Pt denies any systemic infections; Pt states she has been hospitalized for breast cancer, gallbladder removal, and hysterectomy;   Referring physician: Lucianne Lei, MD [address] [place], [place] [address]  HISTORICAL INFORMATION:   Selected notes from the [place] ED follow up for concern of floaters OU   CURRENT MEDICATIONS: No current outpatient medications on file. (Ophthalmic Drugs)   No current facility-administered medications for this visit.  (Ophthalmic Drugs)   Current Outpatient Medications (Other)  Medication Sig  . albuterol (PROAIR HFA) 108 (90 BASE) MCG/ACT inhaler Inhale 2 puffs into the lungs every 6 (six) hours as needed for shortness of breath.   Marland Kitchen amLODipine (NORVASC) 10 MG tablet Take 10 mg by mouth daily.  Marland Kitchen docusate sodium (COLACE) 100 MG capsule Take 1 capsule (100 mg total) by mouth 2 (two) times daily.  . hydrochlorothiazide (HYDRODIURIL) 12.5 MG tablet Take 2 tablets (25 mg total) by mouth daily.  Marland Kitchen ibuprofen (ADVIL,MOTRIN) 200 MG tablet Take 400 mg by mouth every 6 (six) hours as needed for moderate pain.  Marland Kitchen ibuprofen (ADVIL,MOTRIN) 600 MG tablet Take 1 tablet (600 mg total) by mouth every 6 (six) hours as needed (mild  pain). (Patient not taking: Reported on 12/09/2017)  . metFORMIN (GLUCOPHAGE) 500 MG tablet Take 1 tablet (500 mg total) by mouth 2 (two) times daily with a meal.  . Multiple Vitamins-Minerals (MULTIVITAMIN PO) Take 1 tablet by mouth daily.  . tamoxifen (NOLVADEX) 20 MG tablet Take 1 tablet (20 mg total) by mouth daily.  . traMADol (ULTRAM) 50 MG tablet Take 1 tablet (50 mg total) by mouth every 6 (six) hours as needed.   No current facility-administered medications for this visit.  (Other)      REVIEW OF SYSTEMS: ROS    Positive for: Endocrine, Cardiovascular, Eyes, Respiratory   Negative for: Constitutional,  Gastrointestinal, Neurological, Skin, Genitourinary, Musculoskeletal, HENT, Psychiatric, Allergic/Imm, Heme/Lymph   Last edited by Debbrah Alar, COT on 12/10/2017  1:29 PM. (History)       ALLERGIES Allergies  Allergen Reactions  . Shrimp [Shellfish Allergy] Anaphylaxis and Swelling  . Aleve [Naproxen] Hypertension    MD told the patient to not take this because it will elevate her B/P    PAST MEDICAL HISTORY Past Medical History:  Diagnosis Date  . Anemia    history of blood transfusion-no abnormal reaction noted  . Arthritis   . Asthma    Albuterol as needed  . 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 Diabetes mellitus without complication ([place])    takes Metformin daily  . History of migraine    several yrs ago  . Hypertension    takes Tribenzor daily  . Joint pain    Past Surgical History:  Procedure Laterality Date  . BIOPSY BREAST Left 12/05/12   fibroadenoma  . BREAST LUMPECTOMY     2014  . BREAST SURGERY Right   . CESAREAN SECTION  01/05/2009  . CHOLECYSTECTOMY N/A 01/31/2016   Procedure: LAPAROSCOPIC CHOLECYSTECTOMY;  Surgeon: Erroll Luna, MD;  Location: [facility];  Service: General;  Laterality: N/A;  . DILITATION & CURRETTAGE/HYSTROSCOPY WITH HYDROTHERMAL ABLATION N/A 08/12/2013   Procedure: DILATATION & CURETTAGE/HYSTEROSCOPY WITH HYDROTHERMAL ABLATION;  Surgeon: Frederico Hamman, MD;  Location: [facility];  Service: Gynecology;  Laterality: N/A;  . ROBOTIC ASSISTED TOTAL HYSTERECTOMY N/A 06/18/2017   Procedure: XI ROBOTIC ASSISTED TOTAL HYSTERECTOMY;  Surgeon: Lavonia Drafts, MD;  Location: [facility];  Service: Gynecology;  Laterality: N/A;  . SALPINGOOPHORECTOMY Bilateral 06/18/2017   Procedure: BILATERAL SALPINGO OOPHORECTOMY;  Surgeon: Lavonia Drafts, MD;  Location: [facility];  Service: Gynecology;  Laterality: Bilateral;    FAMILY HISTORY Family History  Problem Relation Age of Onset  . Hypertension Mother   .  Diabetes Mother   . Breast cancer Paternal Grandmother        dx in her 66s  . Prostate cancer Paternal Grandfather   . Prostate cancer Father 19  . Prostate cancer Paternal Uncle   . Breast cancer Other        paternal grandmother's sister  . Anesthesia problems Neg Hx   . Amblyopia Neg Hx   . Blindness Neg Hx   . Cataracts Neg Hx   . Glaucoma Neg Hx   . Macular degeneration Neg Hx   . Retinal detachment Neg Hx   . Strabismus Neg Hx   . Retinitis pigmentosa Neg Hx     SOCIAL HISTORY Social History   Tobacco Use  . Smoking status: Never Smoker  . Smokeless tobacco: Never Used  Substance Use Topics  . Alcohol use: Yes    Alcohol/week: 1.0 standard drinks    Types: 1  Cans of beer per week    Comment: occa  . Drug use: No         OPHTHALMIC EXAM:  Base Eye Exam    Visual Acuity (Snellen - Linear)      Right Left   Dist [place] 20/50 -2 20/40 +2   Dist ph [place] 20/20 -2 20/25 +2       Tonometry (Tonopen, 1:37 PM)      Right Left   Pressure 20 20       Pupils      Dark Light Shape React APD   Right 3 2 Round Slow None   Left 3 2 Round Slow None       Visual Fields (Counting fingers)      Left Right    Full Full       Extraocular Movement      Right Left    Full, Ortho Full, Ortho       Neuro/Psych    Oriented x3:  Yes   Mood/Affect:  Normal       Dilation    Both eyes:  1.0% Mydriacyl, 2.5% Phenylephrine @ 1:37 PM        Slit Lamp and Fundus Exam    Slit Lamp Exam      Right Left   Lids/Lashes Dermatochalasis - upper lid, Meibomian gland dysfunction Dermatochalasis - upper lid, Meibomian gland dysfunction   Conjunctiva/Sclera Melanosis Melanosis   Cornea Arcus Arcus   Anterior Chamber Deep and quiet Deep and quiet   Iris Round and moderately dilated, No NVI Round and moderately dilated, No NVI   Lens 1+ Nuclear sclerosis, 1-2+ Cortical cataract 1+ Nuclear sclerosis, 1-2+ Cortical cataract   Vitreous Vitreous syneresis, Posterior vitreous  detachment Vitreous syneresis       Fundus Exam      Right Left   Disc Pink and Sharp Pink and Sharp   C/D Ratio 0.2 0.2   Macula Good foveal reflex, No heme or edema Good foveal reflex, Retinal pigment epithelial mottling, No heme or edema   Vessels Vascular attenuation Vascular attenuation   Periphery Attached, Temporal White without pressure, No RT/RD Attached, Inferior-temporal White without pressure, No RT/RD        Refraction    Manifest Refraction      Sphere Cylinder Dist VA   Right -1.25 Sphere 20/20-1   Left -1.25  20/20-2          IMAGING AND PROCEDURES  Imaging and Procedures for @TODAY @  OCT, Retina - OU - Both Eyes       Right Eye Quality was good. Central Foveal Thickness: 252. Progression has no prior data. Findings include normal foveal contour, no IRF, no SRF.   Left Eye Quality was good. Central Foveal Thickness: 236. Progression has no prior data. Findings include normal foveal contour, no IRF, no SRF, vitreomacular adhesion .   Notes *Images captured and stored on drive  Diagnosis / Impression:  OD: NFP, No IRF/SRF OS: NFP, No IRF/SRF, VMA  Clinical management:  See below  Abbreviations: NFP - Normal foveal profile. CME - cystoid macular edema. PED - pigment epithelial detachment. IRF - intraretinal fluid. SRF - subretinal fluid. EZ - ellipsoid zone. ERM - epiretinal membrane. ORA - outer retinal atrophy. ORT - outer retinal tubulation. SRHM - subretinal hyper-reflective material                    ASSESSMENT/PLAN:    ICD-10-CM   1. Retinal edema  H35.81 OCT, Retina - OU - Both Eyes    1. PVD / vitreous syneresis OD  Acute symptomatic floater OD  Discussed findings and prognosis  No RT or RD on 360 scleral depressed exam  Reviewed s/s of RT/RD  Strict return precautions for any such RT/RD signs/symptoms  F/U PRN  2. No retinal edema on exam or OCT  3. Diabetes mellitus, type 2 without retinopathy - The incidence, risk  factors for progression, natural history and treatment options for diabetic retinopathy  were discussed with patient.   - The need for close monitoring of blood glucose, blood pressure, and serum lipids, avoiding cigarette or any type of tobacco, and the need for long term follow up was also discussed with patient. - f/u in 1 year, sooner prn  4,5. Hypertensive retinopathy OU - discussed importance of tight BP control - monitor  6. Nuclear sclerosis OU  - The symptoms of cataract, surgical options, and treatments and risks were discussed with patient. - discussed diagnosis and progression - not yet visually significant - monitor for now  7. Mild Myopia OU-  - doing well  - monitor    Ophthalmic Meds Ordered this visit:  No orders of the defined types were placed in this encounter.      Return if symptoms worsen or fail to improve.  There are no Patient Instructions on file for this visit.   Explained the diagnoses, plan, and follow up with the patient and they expressed understanding.  Patient expressed understanding of the importance of proper follow up care.   This document serves as a record of services personally performed by Gardiner Sleeper, MD, PhD. It was created on their behalf by Catha Brow, [place], a certified ophthalmic assistant. The creation of this record is the provider's dictation and/or activities during the visit.  Electronically signed by: Catha Brow, [place]  09.24.19 10:29 PM    Gardiner Sleeper, M.D., Ph.D. Diseases & Surgery of the Retina and Vitreous [facility]  I have reviewed the above documentation for accuracy and completeness, and I agree with the above. Gardiner Sleeper, M.D., Ph.D. 12/11/17 10:32 PM     Abbreviations: M myopia (nearsighted); A astigmatism; H hyperopia (farsighted); P presbyopia; Mrx spectacle prescription;  CTL contact lenses; OD right eye; OS left eye; OU both eyes  XT exotropia; ET esotropia;  PEK punctate epithelial keratitis; PEE punctate epithelial erosions; DES dry eye syndrome; MGD meibomian gland dysfunction; ATs artificial tears; PFAT's preservative free artificial tears; [place] nuclear sclerotic cataract; PSC posterior subcapsular cataract; ERM epi-retinal membrane; PVD posterior vitreous detachment; RD retinal detachment; DM diabetes mellitus; DR diabetic retinopathy; NPDR non-proliferative diabetic retinopathy; PDR proliferative diabetic retinopathy; CSME clinically significant macular edema; DME diabetic macular edema; dbh dot blot hemorrhages; CWS cotton wool spot; POAG primary open angle glaucoma; C/D cup-to-disc ratio; HVF humphrey visual field; GVF goldmann visual field; OCT optical coherence tomography; IOP intraocular pressure; BRVO Branch retinal vein occlusion; CRVO central retinal vein occlusion; CRAO central retinal artery occlusion; BRAO branch retinal artery occlusion; RT retinal tear; SB scleral buckle; PPV pars plana vitrectomy; VH Vitreous hemorrhage; PRP panretinal laser photocoagulation; IVK intravitreal kenalog; VMT vitreomacular traction; MH Macular hole;  NVD neovascularization of the disc; NVE neovascularization elsewhere; AREDS age related eye disease study; ARMD age related macular degeneration; POAG primary open angle glaucoma; EBMD epithelial/anterior basement membrane dystrophy; ACIOL anterior chamber intraocular lens; IOL intraocular lens; PCIOL posterior chamber intraocular lens; Phaco/IOL phacoemulsification with intraocular lens placement;  [place] photorefractive keratectomy; LASIK laser assisted in situ keratomileusis; HTN hypertension; DM diabetes mellitus; COPD chronic obstructive pulmonary disease

## 2017-12-11 ENCOUNTER — Encounter (INDEPENDENT_AMBULATORY_CARE_PROVIDER_SITE_OTHER): Payer: Self-pay | Admitting: Ophthalmology

## 2017-12-27 ENCOUNTER — Other Ambulatory Visit: Payer: Self-pay

## 2017-12-27 MED FILL — HYDROCHLOROTHIAZIDE 12.5 MG: 12.5 | 30 days supply | Qty: 60 | Fill #0

## 2017-12-27 MED FILL — metFORMIN HCL 500 MG TABS: 500 | 30 days supply | Qty: 60 | Fill #0

## 2017-12-27 MED FILL — AMLODIPINE BESYLATE 10 MG T: 10 | 30 days supply | Qty: 30 | Fill #0

## 2017-12-30 ENCOUNTER — Encounter: Payer: Self-pay | Admitting: Family Medicine

## 2017-12-30 ENCOUNTER — Ambulatory Visit (INDEPENDENT_AMBULATORY_CARE_PROVIDER_SITE_OTHER): Payer: Self-pay | Admitting: Family Medicine

## 2017-12-30 VITALS — BP 131/93 | HR 113 | Temp 98.3°F | Resp 17 | Ht 62.0 in | Wt 241.2 lb

## 2017-12-30 DIAGNOSIS — H43811 Vitreous degeneration, right eye: Secondary | ICD-10-CM

## 2017-12-30 DIAGNOSIS — I10 Essential (primary) hypertension: Secondary | ICD-10-CM

## 2017-12-30 DIAGNOSIS — R51 Headache: Secondary | ICD-10-CM

## 2017-12-30 DIAGNOSIS — Z23 Encounter for immunization: Secondary | ICD-10-CM

## 2017-12-30 DIAGNOSIS — R519 Headache, unspecified: Secondary | ICD-10-CM

## 2017-12-30 DIAGNOSIS — M19011 Primary osteoarthritis, right shoulder: Secondary | ICD-10-CM

## 2017-12-30 MED ORDER — MELOXICAM 15 MG PO TABS: 15.0000 mg | ORAL_TABLET | Freq: Every day | ORAL | 0 refills | Status: DC

## 2017-12-30 MED ORDER — CYCLOBENZAPRINE HCL 10 MG PO TABS: 10.0000 mg | ORAL_TABLET | Freq: Every day | ORAL | 0 refills | Status: DC

## 2017-12-30 MED ORDER — ALBUTEROL SULFATE HFA 108 (90 BASE) MCG/ACT IN AERS: 2.0000 | INHALATION_SPRAY | Freq: Four times a day (QID) | RESPIRATORY_TRACT | 1 refills | Status: DC | PRN

## 2017-12-30 MED ORDER — METHYLPREDNISOLONE SODIUM SUCC 125 MG IJ SOLR
125.0000 mg | Freq: Once | INTRAMUSCULAR | Status: AC
  Administered 2017-12-30: 125 mg via INTRAMUSCULAR

## 2017-12-30 MED ORDER — BUTALBITAL-APAP-CAFFEINE 50-325-40 MG PO TABS: 1.0000 | ORAL_TABLET | Freq: Four times a day (QID) | ORAL | 0 refills | Status: DC | PRN

## 2017-12-30 NOTE — Patient Instructions (Addendum)
Thank you for choosing Primary Care at [facility] for your medical home!    Sheryl Cox was seen by Molli Barrows, FNP today.   Sheryl Cox's primary care provider is Scot Jun, FNP.   For the best care possible,  you should try to see Molli Barrows, FNP whenever you come to clinic.   We look forward to seeing you again soon!  If you have any questions about your visit today,  please call us at   Or feel free to reach your provider via [place].       Shoulder Pain Many things can cause shoulder pain, including:  An injury.  Moving the arm in the same way again and again (overuse).  Joint pain (arthritis).  Follow these instructions at home: Take these actions to help with your pain:  Squeeze a soft ball or a foam pad as much as you can. This helps to prevent swelling. It also makes the arm stronger.  Take over-the-counter and prescription medicines only as told by your doctor.  If told, put ice on the area: ? Put ice in a plastic bag. ? Place a towel between your skin and the bag. ? Leave the ice on for 20 minutes, 2-3 times per day. Stop putting on ice if it does not help with the pain.  If you were given a shoulder sling or immobilizer: ? Wear it as told. ? Remove it to shower or bathe. ? Move your arm as little as possible. ? Keep your hand moving. This helps prevent swelling.  Contact a doctor if:  Your pain gets worse.  Medicine does not help your pain.  You have new pain in your arm, hand, or fingers. Get help right away if:  Your arm, hand, or fingers: ? Tingle. ? Are numb. ? Are swollen. ? Are painful. ? Turn white or blue. This information is not intended to replace advice given to you by your health care provider. Make sure you discuss any questions you have with your health care provider. Document Released: 08/22/2007 Document Revised: 10/30/2015 Document Reviewed: 06/28/2014 Elsevier Interactive Patient Education   2018 [place].  Methylprednisolone Solution for Injection What is this medicine? METHYLPREDNISOLONE (meth ill pred NISS oh lone) is a corticosteroid. It is commonly used to treat inflammation of the skin, joints, lungs, and other organs. Common conditions treated include asthma, allergies, and arthritis. It is also used for other conditions, such as blood disorders and diseases of the adrenal glands. This medicine may be used for other purposes; ask your health care provider or pharmacist if you have questions. COMMON BRAND NAME(S): A-Methapred, Solu-Medrol What should I tell my health care provider before I take this medicine? They need to know if you have any of these conditions: -Cushing's syndrome -eye disease, vision problems -diabetes -glaucoma -heart disease -high blood pressure -infection (especially a virus infection such as chickenpox, cold sores, or herpes) -liver disease -mental illness -myasthenia gravis -osteoporosis -recently received or scheduled to receive a vaccine -seizures -stomach or intestine problems -thyroid disease -an unusual or allergic reaction to lactose, methylprednisolone, other medicines, foods, dyes, or preservatives -pregnant or trying to get pregnant -breast-feeding How should I use this medicine? This medicine is for injection or infusion into a vein. It is also for injection into a muscle. It is given by a health care professional in a hospital or clinic setting. Talk to your pediatrician regarding the use of this medicine in children. While this drug may  be prescribed for selected conditions, precautions do apply. Overdosage: If you think you have taken too much of this medicine contact a poison control center or emergency room at once. NOTE: This medicine is only for you. Do not share this medicine with others. What if I miss a dose? This does not apply. What may interact with this medicine? Do not take this medicine with any of the  following medications: -alefacept -echinacea -iopamidol -live virus vaccines -metyrapone -mifepristone This medicine may also interact with the following medications: -amphotericin B -aspirin and aspirin-like medicines -certain antibiotics like erythromycin, clarithromycin, troleandomycin -certain medicines for diabetes -certain medicines for fungal infection like ketoconazole -certain medicines for seizures like carbamazepine, phenobarbital, phenytoin -certain medicines that treat or prevent blood clots like warfarin -cyclosporine -digoxin -diuretics -female hormones, like estrogens and birth control pills -isoniazid -NSAIDS, medicines for pain and inflammation, like ibuprofen or naproxen -other medicines for myasthenia gravis -rifampin -vaccines This list may not describe all possible interactions. Give your health care provider a list of all the medicines, herbs, non-prescription drugs, or dietary supplements you use. Also tell them if you smoke, drink alcohol, or use illegal drugs. Some items may interact with your medicine. What should I watch for while using this medicine? Tell your doctor or healthcare professional if your symptoms do not start to get better or if they get worse. Do not stop taking except on your doctor's advice. You may develop a severe reaction. Your doctor will tell you how much medicine to take. Your condition will be monitored carefully while you are receiving this medicine. This medicine may increase your risk of getting an infection. Tell your doctor or health care professional if you are around anyone with measles or chickenpox, or if you develop sores or blisters that do not heal properly. This medicine may affect blood sugar levels. If you have diabetes, check with your doctor or health care professional before you change your diet or the dose of your diabetic medicine. Tell your doctor or health care professional right away if you have any change in  your eyesight. Using this medicine for a long time may increase your risk of low bone mass. Talk to your doctor about bone health. What side effects may I notice from receiving this medicine? Side effects that you should report to your doctor or health care professional as soon as possible: -allergic reactions like skin rash, itching or hives, swelling of the face, lips, or tongue -bloody or tarry stools -changes in vision -hallucination, loss of contact with reality -muscle cramps -muscle pain -palpitations -signs and symptoms of high blood sugar such as dizziness; dry mouth; dry skin; fruity breath; nausea; stomach pain; increased hunger or thirst; increased urination -signs and symptoms of infection like fever or chills; cough; sore throat; pain or trouble passing urine -trouble passing urine or change in the amount of urine Side effects that usually do not require medical attention (report to your doctor or health care professional if they continue or are bothersome): -changes in emotions or mood -constipation -diarrhea -excessive hair growth on the face or body -headache -nausea, vomiting -pain, redness, or irritation at site where injected -trouble sleeping -weight gain This list may not describe all possible side effects. Call your doctor for medical advice about side effects. You may report side effects to FDA at 1-800-FDA-1088. Where should I keep my medicine? This drug is given in a hospital or clinic and will not be stored at home. NOTE: This sheet is a summary.  It may not cover all possible information. If you have questions about this medicine, talk to your doctor, pharmacist, or health care provider.  2018 Elsevier/Gold Standard (2015-05-12 16:21:28)

## 2017-12-30 NOTE — Progress Notes (Signed)
Sheryl Cox, is a 47 y.o. female  XTK:[id number]  DJM:[id number]  DOB - 1970-04-17  CC:  Chief Complaint  Patient presents with  . Establish Care  . Follow-up    ED 9/23 for blurry vision & seeing spots/lines. saw opthalmology on 9/24. states that she was told that everything was normal with her eyes. is still having vision issues       HPI: Sheryl Cox is a 47 y.o. female is here today to establish care.   Sheryl Cox has Cervical mass; Fibroid uterus; Nabothian cyst; Breast cancer of upper-outer quadrant of right female breast ([place]); DCIS (ductal carcinoma in situ); and Anemia  Posterior Vitreous Detachment or Right Eye  Recently presented to the ER on 12/09/2017 with a complaint of acute onset of seeing spots, black, lines bilaterally x 1 week. She has since followed up with the [facility] and Dr. Coralyn Pear confirmed that patient has a PV detachment of right eye likely secondary to uncontrolled hypertension. She reports continued diminished visual acuity however, only wears prescription glasses for reading. Dr. Coralyn Pear recommendations include manage blood pressure and diabetes in addition to follow-up within 1 years.   Hypertension [name] reports no home monitoring of blood pressure.  She maintains adherence to blood pressure medications.She is a nonsmoker. Reports no routine exercise. Current Body mass index is 44.12 kg/m. Denies any episodes of dizziness, shortness of breath, or chest pain. Complains of headaches persistently reoccurring over the last 2 days. Uncertain if related to elevated BP.  Headaches Headaches upon awakening. She hasn't been checking her BP at home. Headache present since this weekend. Naproxen resolved headache only temporally, headaches returned. Denies any numbness, unilateral weakness, changes in speech, SOB, or dizziness. No known history of migraines or chronic HA.   Right Shoulder Pain Recent imaging of right shoulder  significant for mild degenerative changes of acromioclavicular joint. She works a job that requires lifting and continuous ROM. Complains of soreness localized to the upper shoulder without radiation into the neck. No numbness or tingling radiating down right arm. Take ibuprofen occasionally for pain without sufficient relief.  Patient denies new headaches, chest pain, abdominal pain, nausea, new weakness , numbness or tingling, SOB, edema, or worrisome cough.   Current medications: Current Outpatient Medications:  .  albuterol (PROAIR HFA) 108 (90 BASE) MCG/ACT inhaler, Inhale 2 puffs into the lungs every 6 (six) hours as needed for shortness of breath. , Disp: , Rfl:  .  amLODipine (NORVASC) 10 MG tablet, Take 10 mg by mouth daily., Disp: , Rfl: 3 .  hydrochlorothiazide (HYDRODIURIL) 12.5 MG tablet, Take 2 tablets (25 mg total) by mouth daily., Disp: 30 tablet, Rfl: 3 .  ibuprofen (ADVIL,MOTRIN) 200 MG tablet, Take 400 mg by mouth every 6 (six) hours as needed for moderate pain., Disp: , Rfl:  .  metFORMIN (GLUCOPHAGE) 500 MG tablet, Take 1 tablet (500 mg total) by mouth 2 (two) times daily with a meal., Disp: 60 tablet, Rfl: 2 .  Multiple Vitamins-Minerals (MULTIVITAMIN PO), Take 1 tablet by mouth daily., Disp: , Rfl:  .  tamoxifen (NOLVADEX) 20 MG tablet, Take 1 tablet (20 mg total) by mouth daily., Disp: 90 tablet, Rfl: 3   Pertinent family medical history: family history includes Breast cancer in her other, paternal aunt, and paternal grandmother; Diabetes in her mother; Hypertension in her mother; Prostate cancer in her paternal grandfather and paternal uncle; Prostate cancer (age of onset: 102) in her father.   Allergies  Allergen Reactions  .  Shrimp [Shellfish Allergy] Anaphylaxis and Swelling  . Aleve [Naproxen] Hypertension    MD told the patient to not take this because it will elevate her B/P    Social History   Socioeconomic History  . Marital status: Single    Spouse name:  Not on file  . Number of children: 1  . Years of education: Not on file  . Highest education level: Not on file  Occupational History  . Occupation: WESTERN Product manager: [employer]  Social Needs  . Financial resource strain: Not on file  . Food insecurity:    Worry: Not on file    Inability: Not on file  . Transportation needs:    Medical: Not on file    Non-medical: Not on file  Tobacco Use  . Smoking status: Never Smoker  . Smokeless tobacco: Never Used  Substance and Sexual Activity  . Alcohol use: Yes    Alcohol/week: 1.0 standard drinks    Types: 1 Cans of beer per week    Comment: occa  . Drug use: No  . Sexual activity: Not Currently    Birth control/protection: None    Comment: last intercourse > 1 year.  Lifestyle  . Physical activity:    Days per week: Not on file    Minutes per session: Not on file  . Stress: Not on file  Relationships  . Social connections:    Talks on phone: Not on file    Gets together: Not on file    Attends religious service: Not on file    Active member of club or organization: Not on file    Attends meetings of clubs or organizations: Not on file    Relationship status: Not on file  . Intimate partner violence:    Fear of current or ex partner: Not on file    Emotionally abused: Not on file    Physically abused: Not on file    Forced sexual activity: Not on file  Other Topics Concern  . Not on file  Social History Narrative  . Not on file    Review of Systems: Constitutional: Negative for fever, chills, diaphoresis, activity change, appetite change and fatigue. HENT: Negative for ear pain, nosebleeds, congestion, facial swelling, rhinorrhea, neck pain, neck stiffness and ear discharge.  Eyes: Negative for pain, discharge, redness, itching and visual disturbance. Respiratory: Negative for cough, choking, chest tightness, shortness of breath, wheezing and stridor.  Cardiovascular: Negative for  chest pain, palpitations and leg swelling. Gastrointestinal: Negative for abdominal distention. Musculoskeletal: Negative for back pain, joint swelling, arthralgia and gait problem. Neurological: Negative for dizziness, tremors, seizures, syncope, facial asymmetry, speech difficulty, weakness, light-headedness, numbness and headaches.  Hematological: Negative for adenopathy. Does not bruise/bleed easily. Psychiatric/Behavioral: Negative for hallucinations, behavioral problems, confusion, dysphoric mood, decreased concentration and agitation.  Objective:   Vitals:   12/30/17 1432  BP: (!) 131/93  Pulse: (!) 113  Resp: 17  Temp: 98.3 F (36.8 C)  SpO2: 95%    BP Readings from Last 3 Encounters:  12/30/17 (!) 131/93  12/09/17 (!) 151/97  09/10/17 (!) 134/96    Filed Weights   12/30/17 1432  Weight: 241 lb 3.2 oz (109.4 kg)      Physical Exam: Constitutional: Patient appears well-developed and well-nourished. No distress. HENT: Normocephalic, atraumatic, External right and left ear normal. Oropharynx is clear and moist.  Eyes: Conjunctivae and EOM are normal. PERRLA, no scleral icterus. Neck: Normal ROM. Neck supple. No  JVD. No tracheal deviation. No thyromegaly. CVS: RRR, S1/S2 +, no murmurs, no gallops, no carotid bruit.  Pulmonary: Effort and breath sounds normal, no stridor, rhonchi, wheezes, rales.  Abdominal: Soft. BS +, no distension, tenderness, rebound or guarding.  Musculoskeletal: Right shoulder: limited ROM. Palpable tenderness. Negative for visual deformity  Neuro: Alert. Normal reflexes, muscle tone coordination. No cranial nerve deficit. Skin: Skin is warm and dry. No rash noted. Not diaphoretic. No erythema. No pallor. Psychiatric: Normal mood and affect. Behavior, judgment, thought content normal.  Dg Shoulder Right  Result Date: 12/09/2017 CLINICAL DATA:  47 year old female pulled right shoulder 3 days ago. Pain top and anterior aspect right shoulder. Initial  encounter. EXAM: RIGHT SHOULDER - 2+ VIEW COMPARISON:  None. FINDINGS: No fracture or dislocation. Very mild acromioclavicular joint degenerative changes. No abnormal soft tissue calcifications. Minimally low-lying humeral head may be related to projection. Visualized lungs clear. IMPRESSION: 1.  No fracture or dislocation. 2. Very mild acromioclavicular joint degenerative changes. Electronically Signed   By: Genia Del M.D.   On: 12/09/2017 07:49    Lab Results  Component Value Date   WBC 9.3 06/19/2017   HGB 9.0 (L) 06/19/2017   HCT 28.5 (L) 06/19/2017   MCV 75.4 (L) 06/19/2017   PLT 275 06/19/2017   Lab Results  Component Value Date   CREATININE 0.75 06/19/2017   BUN 11 06/19/2017   NA 136 06/19/2017   K 4.3 06/19/2017   CL 102 06/19/2017   CO2 25 06/19/2017    Lab Results  Component Value Date   HGBA1C 5.4 06/13/2017       Assessment and plan:  1. Need for immunization against influenza - Flu Vaccine QUAD 36+ mos IM  2. Persistent headaches, uncertain if related to BP. Encouraged occasional BP monitoring at home. Hydrate well with water.  -acute headache pain will trial Fioricet 50-325-40 as needed for headache pain. If no improvement or if HA worsen, follow-up here with me in office.  3. Arthritis of right acromioclavicular joint - methylPREDNISolone sodium succinate (SOLU-MEDROL) 125 mg/2 mL injection 125 mg, IM given to reduce inflammation. -cyclobenzaprine 10 mg at bedtime as needed for pain. -daily Meloxicam 15 mg as needed for pain/inflammation as needed.   4. Essential hypertension, uncontrolled today We have discussed target BP range and blood pressure goal. I have advised patient to check BP regularly and to call us back or report to clinic if the numbers are consistently higher than 140/90. We discussed the importance of compliance with medical therapy and DASH diet recommended, consequences of uncontrolled hypertension discussed. No changes in medication  today. Patient will return in 4 weeks for BP follow-up. If no improvement will consider adding a betablocker or ARB  5. Posterior vitreous detachment of right eye -continue follow-up and recommendations per Dr Coralyn Pear.     Orders Placed This Encounter  Procedures  . Flu Vaccine QUAD 36+ mos IM   Meds ordered this encounter  Medications  . albuterol (PROAIR HFA) 108 (90 Base) MCG/ACT inhaler    Sig: Inhale 2 puffs into the lungs every 6 (six) hours as needed for shortness of breath.    Dispense:  18 g    Refill:  1  . butalbital-acetaminophen-caffeine (FIORICET, ESGIC) 50-325-40 MG tablet    Sig: Take 1 tablet by mouth every 6 (six) hours as needed for headache.    Dispense:  20 tablet    Refill:  0  . meloxicam (MOBIC) 15 MG tablet    Sig:  Take 1 tablet (15 mg total) by mouth daily.    Dispense:  30 tablet    Refill:  0  . cyclobenzaprine (FLEXERIL) 10 MG tablet    Sig: Take 1 tablet (10 mg total) by mouth at bedtime.    Dispense:  30 tablet    Refill:  0  . methylPREDNISolone sodium succinate (SOLU-MEDROL) 125 mg/2 mL injection 125 mg     Return in about 4 weeks (around 01/27/2018) for Hypertension and fasting labs . Need A1C.   The patient was given clear instructions to go to ER or return to medical center if symptoms don't improve, worsen or new problems develop. The patient verbalized understanding. The patient was told to call to get lab results if they haven't heard anything in the next week.    Molli Barrows, FNP [facility] [address] [place] [phone]: [phone]   A total of 45  minutes spent, greater than 50 % of this time was spent counseling and coordination of care.    This note has been created with Surveyor, quantity. Any transcriptional errors are unintentional.

## 2018-01-27 ENCOUNTER — Ambulatory Visit: Payer: Medicaid Other | Admitting: Family Medicine

## 2018-01-28 ENCOUNTER — Ambulatory Visit: Payer: Self-pay

## 2018-01-28 ENCOUNTER — Encounter: Payer: Self-pay | Admitting: Family Medicine

## 2018-01-28 VITALS — BP 136/92 | HR 81

## 2018-01-28 DIAGNOSIS — Z13220 Encounter for screening for lipoid disorders: Secondary | ICD-10-CM

## 2018-01-28 DIAGNOSIS — Z1329 Encounter for screening for other suspected endocrine disorder: Secondary | ICD-10-CM

## 2018-01-28 DIAGNOSIS — Z131 Encounter for screening for diabetes mellitus: Secondary | ICD-10-CM

## 2018-01-28 DIAGNOSIS — I10 Essential (primary) hypertension: Secondary | ICD-10-CM

## 2018-01-29 LAB — COMPREHENSIVE METABOLIC PANEL
ALK PHOS: 41 IU/L (ref 39–117)
ALT: 15 IU/L (ref 0–32)
AST: 19 IU/L (ref 0–40)
Albumin/Globulin Ratio: 1.8 (ref 1.2–2.2)
Albumin: 4.3 g/dL (ref 3.5–5.5)
BUN/Creatinine Ratio: 21 (ref 9–23)
BUN: 15 mg/dL (ref 6–24)
CO2: 23 mmol/L (ref 20–29)
Calcium: 9.2 mg/dL (ref 8.7–10.2)
Chloride: 105 mmol/L (ref 96–106)
Creatinine, Ser: 0.73 mg/dL (ref 0.57–1.00)
GFR calc Af Amer: 113 mL/min/{1.73_m2} (ref >59)
GFR calc non Af Amer: 98 mL/min/{1.73_m2} (ref >59)
GLUCOSE: 117 mg/dL — AB (ref 65–99)
Globulin, Total: 2.4 g/dL (ref 1.5–4.5)
Potassium: 3.8 mmol/L (ref 3.5–5.2)
Sodium: 144 mmol/L (ref 134–144)
Total Protein: 6.7 g/dL (ref 6.0–8.5)

## 2018-01-29 LAB — URINALYSIS
BILIRUBIN UA: NEGATIVE
GLUCOSE, UA: NEGATIVE
Ketones, UA: NEGATIVE
Leukocytes, UA: NEGATIVE
NITRITE UA: NEGATIVE
PH UA: 6 (ref 5.0–7.5)
Protein, UA: NEGATIVE
RBC, UA: NEGATIVE
Specific Gravity, UA: 1.021 (ref 1.005–1.030)
UUROB: 0.2 mg/dL (ref 0.2–1.0)

## 2018-01-29 LAB — CBC WITH DIFFERENTIAL/PLATELET
Basophils Absolute: 0 10*3/uL (ref 0.0–0.2)
Basos: 0 %
EOS (ABSOLUTE): 0.1 10*3/uL (ref 0.0–0.4)
EOS: 2 %
Hematocrit: 36.7 % (ref 34.0–46.6)
Hemoglobin: 12 g/dL (ref 11.1–15.9)
IMMATURE GRANS (ABS): 0 10*3/uL (ref 0.0–0.1)
IMMATURE GRANULOCYTES: 1 %
LYMPHS: 31 %
Lymphocytes Absolute: 1.3 10*3/uL (ref 0.7–3.1)
MCH: 24.7 pg — ABNORMAL LOW (ref 26.6–33.0)
MCHC: 32.7 g/dL (ref 31.5–35.7)
MCV: 76 fL — ABNORMAL LOW (ref 79–97)
MONOS ABS: 0.3 10*3/uL (ref 0.1–0.9)
Monocytes: 7 %
NEUTROS PCT: 59 %
Neutrophils Absolute: 2.4 10*3/uL (ref 1.4–7.0)
PLATELETS: 320 10*3/uL (ref 150–450)
RBC: 4.86 x10E6/uL (ref 3.77–5.28)
RDW: 14.9 % (ref 12.3–15.4)
WBC: 4.1 10*3/uL (ref 3.4–10.8)

## 2018-01-29 LAB — LIPID PANEL
CHOLESTEROL TOTAL: 153 mg/dL (ref 100–199)
Chol/HDL Ratio: 5.7 ratio — ABNORMAL HIGH (ref 0.0–4.4)
HDL: 27 mg/dL — AB (ref >39)
LDL Calculated: 95 mg/dL (ref 0–99)
TRIGLYCERIDES: 154 mg/dL — AB (ref 0–149)
VLDL CHOLESTEROL CAL: 31 mg/dL (ref 5–40)

## 2018-01-29 LAB — HEMOGLOBIN A1C
ESTIMATED AVERAGE GLUCOSE: 126 mg/dL
Hgb A1c MFr Bld: 6 % — ABNORMAL HIGH (ref 4.8–5.6)

## 2018-01-29 LAB — TSH: TSH: 2.8 u[IU]/mL (ref 0.450–4.500)

## 2018-02-05 NOTE — Progress Notes (Signed)
Patient notified of results & recommendations. Expressed understanding. Lab appt made for 05/08/18

## 2018-04-07 MED FILL — AMLODIPINE BESYLATE 10 MG T: 10 | 30 days supply | Qty: 30 | Fill #1

## 2018-04-24 ENCOUNTER — Ambulatory Visit (INDEPENDENT_AMBULATORY_CARE_PROVIDER_SITE_OTHER): Payer: Self-pay | Admitting: Family Medicine

## 2018-04-24 ENCOUNTER — Encounter: Payer: Self-pay | Admitting: Family Medicine

## 2018-04-24 ENCOUNTER — Emergency Department (HOSPITAL_COMMUNITY)
Admission: EM | Admit: 2018-04-24 | Discharge: 2018-04-24 | Discharge status: Absent without leave | Payer: Medicaid Other

## 2018-04-24 VITALS — BP 137/91 | HR 93 | Resp 17 | Ht 62.0 in | Wt 244.2 lb

## 2018-04-24 DIAGNOSIS — J069 Acute upper respiratory infection, unspecified: Secondary | ICD-10-CM

## 2018-04-24 DIAGNOSIS — M25511 Pain in right shoulder: Secondary | ICD-10-CM

## 2018-04-24 DIAGNOSIS — I10 Essential (primary) hypertension: Secondary | ICD-10-CM

## 2018-04-24 MED ORDER — PREDNISONE 20 MG PO TABS: ORAL_TABLET | ORAL | 0 refills | Status: DC

## 2018-04-24 NOTE — Progress Notes (Signed)
Established Patient Office Visit  Subjective:  Patient ID: Sheryl Cox, female    DOB: 10/21/1970  Age: 48 y.o. MRN: [id number]  CC:  Chief Complaint  Patient presents with  . Shoulder Pain    still having R shoulder pain. no improvement since last visit.  Marland Kitchen URI    daughter has URI & she has been taking alka seltzer + with no relief.    HPI Sheryl Cox presents for evaluation of chronic shoulder pain and URI symptoms.  Sheryl Cox has Cervical mass; Fibroid uterus; Nabothian cyst; Breast cancer of upper-outer quadrant of right female breast ([place]); DCIS (ductal carcinoma in situ); and Anemia on their problem list.   Prior office note 01/09/2018 Recent imaging of right shoulder significant for mild degenerative changes of acromioclavicular joint. She works a job that requires lifting and continuous ROM. Complains of soreness localized to the upper shoulder without radiation into the neck. No numbness or tingling radiating down right arm. Take ibuprofen occasionally for pain without sufficient relief.  Today, continues to complain of gradually worsening right shoulder pain with limited ROM.  No recent injury. Pain improved some with prior treatment of Meloxicam and solumedrol. No recent prednisone use.   Patient complains of congestion, nasal drainage, and non-productive < 1 week. She denies a history of asthma and endorses no sick contacts. No history of asthma. Patient is a non-smoker. Denies prior history of pneumonia. No OTC medications used to improve symptoms. Past Medical History:  Diagnosis Date  . Anemia    history of blood transfusion-no abnormal reaction noted  . Asthma    Albuterol as needed  . Breast cancer ([place]) 11/2012   DCIS-ER+, PR+.Takes Tamoxifen daily  . Diabetes mellitus without complication ([place])    takes Metformin daily  . History of migraine    several yrs ago  . Hypertension    takes Tribenzor daily    Past Surgical History:  Procedure  Laterality Date  . BIOPSY BREAST Left 12/05/12   fibroadenoma  . BREAST LUMPECTOMY     2014  . BREAST SURGERY Right   . CESAREAN SECTION  01/05/2009  . CHOLECYSTECTOMY N/A 01/31/2016   Procedure: LAPAROSCOPIC CHOLECYSTECTOMY;  Surgeon: Erroll Luna, MD;  Location: [facility];  Service: General;  Laterality: N/A;  . DILITATION & CURRETTAGE/HYSTROSCOPY WITH HYDROTHERMAL ABLATION N/A 08/12/2013   Procedure: DILATATION & CURETTAGE/HYSTEROSCOPY WITH HYDROTHERMAL ABLATION;  Surgeon: Frederico Hamman, MD;  Location: [facility];  Service: Gynecology;  Laterality: N/A;  . ROBOTIC ASSISTED TOTAL HYSTERECTOMY N/A 06/18/2017   Procedure: XI ROBOTIC ASSISTED TOTAL HYSTERECTOMY;  Surgeon: Lavonia Drafts, MD;  Location: [facility];  Service: Gynecology;  Laterality: N/A;  . SALPINGOOPHORECTOMY Bilateral 06/18/2017   Procedure: BILATERAL SALPINGO OOPHORECTOMY;  Surgeon: Lavonia Drafts, MD;  Location: [facility];  Service: Gynecology;  Laterality: Bilateral;    Family History  Problem Relation Age of Onset  . Hypertension Mother   . Diabetes Mother   . Breast cancer Paternal Grandmother        dx in her 32s  . Prostate cancer Paternal Grandfather   . Prostate cancer Father 63  . Prostate cancer Paternal Uncle   . Breast cancer Other        paternal grandmother's sister  . Breast cancer Paternal Aunt   . Anesthesia problems Neg Hx   . Amblyopia Neg Hx   . Blindness Neg Hx   . Cataracts Neg Hx   . Glaucoma Neg Hx   . Macular degeneration Neg  Hx   . Retinal detachment Neg Hx   . Strabismus Neg Hx   . Retinitis pigmentosa Neg Hx     Social History   Socioeconomic History  . Marital status: Single    Spouse name: Not on file  . Number of children: 1  . Years of education: Not on file  . Highest education level: Not on file  Occupational History  . Occupation: WESTERN Product manager: [employer]  Social Needs  . Financial resource strain: Not on file  . Food  insecurity:    Worry: Not on file    Inability: Not on file  . Transportation needs:    Medical: Not on file    Non-medical: Not on file  Tobacco Use  . Smoking status: Never Smoker  . Smokeless tobacco: Never Used  Substance and Sexual Activity  . Alcohol use: Yes    Alcohol/week: 1.0 standard drinks    Types: 1 Cans of beer per week    Comment: occa  . Drug use: No  . Sexual activity: Not Currently    Birth control/protection: None    Comment: last intercourse > 1 year.  Lifestyle  . Physical activity:    Days per week: Not on file    Minutes per session: Not on file  . Stress: Not on file  Relationships  . Social connections:    Talks on phone: Not on file    Gets together: Not on file    Attends religious service: Not on file    Active member of club or organization: Not on file    Attends meetings of clubs or organizations: Not on file    Relationship status: Not on file  . Intimate partner violence:    Fear of current or ex partner: Not on file    Emotionally abused: Not on file    Physically abused: Not on file    Forced sexual activity: Not on file  Other Topics Concern  . Not on file  Social History Narrative  . Not on file    Outpatient Medications Prior to Visit  Medication Sig Dispense Refill  . albuterol (PROAIR HFA) 108 (90 Base) MCG/ACT inhaler Inhale 2 puffs into the lungs every 6 (six) hours as needed for shortness of breath. 18 g 1  . amLODipine (NORVASC) 10 MG tablet Take 10 mg by mouth daily.  3  . hydrochlorothiazide (HYDRODIURIL) 12.5 MG tablet Take 2 tablets (25 mg total) by mouth daily. 30 tablet 3  . metFORMIN (GLUCOPHAGE) 500 MG tablet Take 1 tablet (500 mg total) by mouth 2 (two) times daily with a meal. 60 tablet 2  . Multiple Vitamins-Minerals (MULTIVITAMIN PO) Take 1 tablet by mouth daily.    . tamoxifen (NOLVADEX) 20 MG tablet Take 1 tablet (20 mg total) by mouth daily. 90 tablet 3  . butalbital-acetaminophen-caffeine (FIORICET, ESGIC)  50-325-40 MG tablet Take 1 tablet by mouth every 6 (six) hours as needed for headache. 20 tablet 0  . cyclobenzaprine (FLEXERIL) 10 MG tablet Take 1 tablet (10 mg total) by mouth at bedtime. 30 tablet 0  . ibuprofen (ADVIL,MOTRIN) 200 MG tablet Take 400 mg by mouth every 6 (six) hours as needed for moderate pain.    . meloxicam (MOBIC) 15 MG tablet Take 1 tablet (15 mg total) by mouth daily. 30 tablet 0   No facility-administered medications prior to visit.     Allergies  Allergen Reactions  . Shrimp [Shellfish Allergy] Anaphylaxis  and Swelling  . Aleve [Naproxen] Hypertension    MD told the patient to not take this because it will elevate her B/P    ROS Review of Systems    Pertinent negatives listed in HPI Objective:    Physical Exam BP (!) 137/91   Pulse 93   Resp 17   Ht 5\' 2"  (1.575 m)   Wt 244 lb 3.2 oz (110.8 kg)   LMP 05/27/2017 (Approximate)   SpO2 96%   BMI 44.66 kg/m   Constitutional: Patient appears well-developed and well-nourished. No distress. HENT: Normocephalic, atraumatic, External right and left ear normal. Bilateral nares erythematous with rhinorrhea present. Oropharynx is clear and moist.  Eyes: Conjunctivae and EOM are normal. PERRLA, no scleral icterus. Neck: Normal ROM. Neck supple. No JVD. No tracheal deviation. No thyromegaly. CVS: RRR, S1/S2 +, no murmurs, no gallops, no carotid bruit.  Pulmonary: Effort and breath sounds normal, no stridor, rhonchi, wheezes, rales.  Abdominal: Soft. BS +, no distension, tenderness, rebound or guarding.  Musculoskeletal: Right shoulder limited ROM present. No edema or erythema noted. Tenderness present ACJ.  Neuro: Alert. Normal reflexes, muscle tone coordination. No cranial nerve deficit. Skin: Skin is warm and dry. No rash noted. Not diaphoretic. No erythema. No pallor. Psychiatric: Normal mood and affect. Behavior, judgment, thought content normal. Wt Readings from Last 3 Encounters:  04/24/18 244 lb 3.2 oz  (110.8 kg)  12/30/17 241 lb 3.2 oz (109.4 kg)  12/09/17 236 lb (107 kg)     Health Maintenance Due  Topic Date Due  . URINE MICROALBUMIN  02/14/1980  . HIV Screening  02/13/1985  . TETANUS/TDAP  02/13/1989    There are no preventive care reminders to display for this patient.  Lab Results  Component Value Date   TSH 2.800 01/28/2018   Lab Results  Component Value Date   WBC 4.1 01/28/2018   HGB 12.0 01/28/2018   HCT 36.7 01/28/2018   MCV 76 (L) 01/28/2018   PLT 320 01/28/2018   Lab Results  Component Value Date   NA 144 01/28/2018   K 3.8 01/28/2018   CHLORIDE 103 11/26/2012   CO2 23 01/28/2018   GLUCOSE 117 (H) 01/28/2018   BUN 15 01/28/2018   CREATININE 0.73 01/28/2018   BILITOT <0.2 01/28/2018   ALKPHOS 41 01/28/2018   AST 19 01/28/2018   ALT 15 01/28/2018   PROT 6.7 01/28/2018   ALBUMIN 4.3 01/28/2018   CALCIUM 9.2 01/28/2018   ANIONGAP 9 06/19/2017   Lab Results  Component Value Date   CHOL 153 01/28/2018   Lab Results  Component Value Date   HDL 27 (L) 01/28/2018   Lab Results  Component Value Date   LDLCALC 95 01/28/2018   Lab Results  Component Value Date   TRIG 154 (H) 01/28/2018   Lab Results  Component Value Date   CHOLHDL 5.7 (H) 01/28/2018   Lab Results  Component Value Date   HGBA1C 6.0 (H) 01/28/2018      Assessment & Plan:  1. Acute pain of right shoulder -Will trial Prednisone 20 mg,  in mornings with breakfast as follows:  Take 3 pills for 3 days, Take 2 pills for 3 days, and Take 1 pill for 3 days.  Complete all medication. -If no improvement, will defer management and evaluation to orthopedic surgery  2. Essential hypertension, elevated today. No changes in medications. Elevation likely due to shoulder pain.  3. Viral URI Trial Benzonatate 100-200 mg three times daily for cough Cetrizine  10 mg once daily for congestion   Meds ordered this encounter  Medications  . predniSONE (DELTASONE) 20 MG tablet    Sig: Take  3 PO QAM x3days, 2 PO QAM x3days, 1 PO QAM x3days    Dispense:  18 tablet    Refill:  0    Follow-up: No follow-ups on file.    Molli Barrows, FNP

## 2018-04-24 NOTE — Patient Instructions (Addendum)
I am going to start you on a prednisone taper for 9 days. I will refer you to orthopedics to be evaluated for possible shoulder impingement syndrome. I am also recommending you to sling that right arm. Please call office if no improvement   Shoulder Impingement Syndrome  Shoulder impingement syndrome is a condition that causes pain when connective tissues (tendons) surrounding the shoulder joint become pinched. These tendons are part of the group of muscles and tissues that help to stabilize the shoulder (rotator cuff). Beneath the rotator cuff is a fluid-filled sac (bursa) that allows the muscles and tendons to glide smoothly. The bursa may become swollen or irritated (bursitis). Bursitis, swelling in the rotator cuff tendons, or both conditions can decrease how much space is under a bone in the shoulder joint (acromion), resulting in impingement. What are the causes? Shoulder impingement syndrome may be caused by bursitis or swelling of the rotator cuff tendons, which may result from:  Repetitive overhead arm movements.  Falling onto the shoulder.  Weakness in the shoulder muscles. What increases the risk? You may be more likely to develop this condition if you:  Play sports that involve throwing, such as baseball.  Participate in sports such as tennis, volleyball, and swimming.  Work as a Curator, Games developer, or Architect. Some people are also more likely to develop impingement syndrome because of the shape of their acromion bone. What are the signs or symptoms? The main symptom of this condition is pain on the front or side of the shoulder. The pain may:  Get worse when lifting or raising the arm.  Get worse at night.  Wake you up from sleeping.  Feel sharp when the shoulder is moved and then fade to an ache. Other symptoms may include:  Tenderness.  Stiffness.  Inability to raise the arm above shoulder level or behind the body.  Weakness. How is this  diagnosed? This condition may be diagnosed based on:  Your symptoms and medical history.  A physical exam.  Imaging tests, such as: ? X-rays. ? MRI. ? Ultrasound. How is this treated? This condition may be treated by:  Resting your shoulder and avoiding all activities that cause pain or put stress on the shoulder.  Icing your shoulder.  NSAIDs to help reduce pain and swelling.  One or more injections of medicines to numb the area and reduce inflammation.  Physical therapy.  Surgery. This may be needed if nonsurgical treatments have not helped. Surgery may involve repairing the rotator cuff, reshaping the acromion, or removing the bursa. Follow these instructions at home: Managing pain, stiffness, and swelling   If directed, put ice on the injured area. ? Put ice in a plastic bag. ? Place a towel between your skin and the bag. ? Leave the ice on for 20 minutes, 2-3 times a day. Activity  Rest and return to your normal activities as told by your health care provider. Ask your health care provider what activities are safe for you.  Do exercises as told by your health care provider. General instructions  Do not use any products that contain nicotine or tobacco, such as cigarettes, e-cigarettes, and chewing tobacco. These can delay healing. If you need help quitting, ask your health care provider.  Ask your health care provider when it is safe for you to drive.  Take over-the-counter and prescription medicines only as told by your health care provider.  Keep all follow-up visits as told by your health care provider. This is important.  How is this prevented?  Give your body time to rest between periods of activity.  Be safe and responsible while being active. This will help you avoid falls.  Maintain physical fitness, including strength and flexibility. Contact a health care provider if:  Your symptoms have not improved after 1-2 months of treatment and rest.  You  cannot lift your arm away from your body. Summary  Shoulder impingement syndrome is a condition that causes pain when connective tissues (tendons) surrounding the shoulder joint become pinched.  The main symptom of this condition is pain on the front or side of the shoulder.  This condition is usually treated with rest, ice, and pain medicines as needed. This information is not intended to replace advice given to you by your health care provider. Make sure you discuss any questions you have with your health care provider. Document Released: 03/05/2005 Document Revised: 08/28/2017 Document Reviewed: 08/28/2017 Elsevier Interactive Patient Education  [employer].
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
## 2018-04-28 ENCOUNTER — Telehealth: Payer: Self-pay | Admitting: Family Medicine

## 2018-04-28 MED ORDER — BENZONATATE 100 MG PO CAPS: 100.0000 mg | ORAL_CAPSULE | Freq: Three times a day (TID) | ORAL | 0 refills | Status: DC | PRN

## 2018-04-28 MED ORDER — CETIRIZINE HCL 10 MG PO TABS: 10.0000 mg | ORAL_TABLET | Freq: Every day | ORAL | 11 refills | Status: DC

## 2018-04-28 NOTE — Telephone Encounter (Signed)
Advise patient that benzonatate along with cetirizine has been sent to the pharmacy for her to take as needed for cough and congestion.  Encouraged her to hydrate well with water take cetirizine daily as prescribed until symptoms completely resolved.

## 2018-04-28 NOTE — Telephone Encounter (Signed)
Patient called stating that when she was last in for her OV she requested something for her cough and congestion but only received prednisone. Please follow up

## 2018-04-28 NOTE — Telephone Encounter (Signed)
Please advise 

## 2018-04-29 NOTE — Telephone Encounter (Signed)
Contacted the patient and informed her of prescription sent over for benzonatate and cetirizine and to stay hydrated

## 2018-05-08 ENCOUNTER — Other Ambulatory Visit: Payer: Medicaid Other

## 2018-05-08 ENCOUNTER — Ambulatory Visit: Payer: Self-pay

## 2018-05-08 ENCOUNTER — Encounter: Payer: Self-pay | Admitting: Family Medicine

## 2018-05-08 DIAGNOSIS — M25511 Pain in right shoulder: Principal | ICD-10-CM

## 2018-05-08 DIAGNOSIS — Z13220 Encounter for screening for lipoid disorders: Secondary | ICD-10-CM

## 2018-05-08 DIAGNOSIS — G8929 Other chronic pain: Secondary | ICD-10-CM

## 2018-05-08 DIAGNOSIS — Z131 Encounter for screening for diabetes mellitus: Secondary | ICD-10-CM

## 2018-05-08 NOTE — Progress Notes (Signed)
Patient presented to office today for a lab appointment and advised right shoulder pain did not improve with treatment of prednisone. She is requesting a referral to orthopedic surgery. Referral placed.

## 2018-05-09 LAB — LIPID PANEL
CHOL/HDL RATIO: 5.4 ratio — AB (ref 0.0–4.4)
Cholesterol, Total: 161 mg/dL (ref 100–199)
HDL: 30 mg/dL — ABNORMAL LOW (ref >39)
LDL CALC: 90 mg/dL (ref 0–99)
TRIGLYCERIDES: 207 mg/dL — AB (ref 0–149)
VLDL CHOLESTEROL CAL: 41 mg/dL — AB (ref 5–40)

## 2018-05-09 LAB — HEMOGLOBIN A1C
Est. average glucose Bld gHb Est-mCnc: 134 mg/dL
Hgb A1c MFr Bld: 6.3 % — ABNORMAL HIGH (ref 4.8–5.6)

## 2018-05-12 ENCOUNTER — Encounter (HOSPITAL_COMMUNITY): Payer: Self-pay

## 2018-05-12 ENCOUNTER — Ambulatory Visit (HOSPITAL_COMMUNITY)
Admission: EM | Admit: 2018-05-12 | Discharge: 2018-05-12 | Disposition: A | Discharge status: Home / Self Care | Payer: BLUE CROSS/BLUE SHIELD | Attending: Family Medicine | Admitting: Family Medicine

## 2018-05-12 DIAGNOSIS — J40 Bronchitis, not specified as acute or chronic: Secondary | ICD-10-CM

## 2018-05-12 MED ORDER — DOXYCYCLINE HYCLATE 100 MG PO CAPS: 100.0000 mg | ORAL_CAPSULE | Freq: Two times a day (BID) | ORAL | 0 refills | Status: DC

## 2018-05-12 MED ORDER — ALBUTEROL SULFATE HFA 108 (90 BASE) MCG/ACT IN AERS: 2.0000 | INHALATION_SPRAY | Freq: Four times a day (QID) | RESPIRATORY_TRACT | 1 refills | Status: DC | PRN

## 2018-05-12 MED ORDER — FLUTICASONE PROPIONATE 50 MCG/ACT NA SUSP: 2.0000 | Freq: Every day | NASAL | 0 refills | Status: DC

## 2018-05-12 MED ORDER — IPRATROPIUM BROMIDE 0.06 % NA SOLN: 2.0000 | Freq: Four times a day (QID) | NASAL | 0 refills | Status: DC

## 2018-05-12 MED FILL — FLUTICASONE PROP 50 MCG SPR: 50 | 25 days supply | Qty: 16 | Fill #0

## 2018-05-12 MED FILL — VENTOLIN HFA 90 MCG INHALER: 108 (90 BAS | 25 days supply | Qty: 18 | Fill #0

## 2018-05-12 MED FILL — DOXYCYCLINE HYCLATE 100 MG: 100 | 10 days supply | Qty: 20 | Fill #0

## 2018-05-12 NOTE — ED Provider Notes (Signed)
[facility]    CSN: [id number] Arrival date & time: 05/12/18  4696     History   Chief Complaint Chief Complaint  Patient presents with  . Cough  . Congestion    HPI Sheryl Cox is a 48 y.o. female.   48 year old female comes in for 3 week history of URI symptoms. Has had cough, rhinorrhea, nasal congestion. States cough is nonproductive, worse at night. Denies fever, chills, night sweats. She has felt wheezing, but did not have an inhaler. Has been taking otc cold medicine without relief. Never smoker. She is currently on prednisone for her shoulder.      Past Medical History:  Diagnosis Date  . Anemia    history of blood transfusion-no abnormal reaction noted  . Asthma    Albuterol as needed  . Breast cancer ([place]) 11/2012   DCIS-ER+, PR+.Takes Tamoxifen daily  . Diabetes mellitus without complication ([place])    takes Metformin daily  . History of migraine    several yrs ago  . Hypertension    takes Tribenzor daily    Patient Active Problem List   Diagnosis Date Noted  . Anemia 06/05/2013  . 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 Breast cancer of upper-outer quadrant of right female breast ([place]) 11/24/2012  . Nabothian cyst 12/07/2011  . Cervical mass 11/20/2011  . Fibroid uterus 11/20/2011    Past Surgical History:  Procedure Laterality Date  . BIOPSY BREAST Left 12/05/12   fibroadenoma  . BREAST LUMPECTOMY     2014  . BREAST SURGERY Right   . CESAREAN SECTION  01/05/2009  . CHOLECYSTECTOMY N/A 01/31/2016   Procedure: LAPAROSCOPIC CHOLECYSTECTOMY;  Surgeon: Erroll Luna, MD;  Location: [facility];  Service: General;  Laterality: N/A;  . DILITATION & CURRETTAGE/HYSTROSCOPY WITH HYDROTHERMAL ABLATION N/A 08/12/2013   Procedure: DILATATION & CURETTAGE/HYSTEROSCOPY WITH HYDROTHERMAL ABLATION;  Surgeon: Frederico Hamman, MD;  Location: [facility];  Service: Gynecology;  Laterality: N/A;  . ROBOTIC ASSISTED TOTAL HYSTERECTOMY N/A 06/18/2017   Procedure: XI ROBOTIC ASSISTED TOTAL HYSTERECTOMY;  Surgeon: Lavonia Drafts, MD;  Location: [facility];  Service: Gynecology;  Laterality: N/A;  . SALPINGOOPHORECTOMY Bilateral 06/18/2017   Procedure: BILATERAL SALPINGO OOPHORECTOMY;  Surgeon: Lavonia Drafts, MD;  Location: [facility];  Service: Gynecology;  Laterality: Bilateral;    OB History    Gravida  1   Para  1   Term  1   Preterm  0   AB  0   Living  1     SAB  0   TAB  0   Ectopic  0   Multiple  0   Live Births  1            Home Medications    Prior to Admission medications   Medication Sig Start Date End Date Taking? Authorizing Provider  albuterol (PROAIR HFA) 108 (90 Base) MCG/ACT inhaler Inhale 2 puffs into the lungs every 6 (six) hours as needed for shortness of breath. 05/12/18   Tasia Catchings, Amy V, PA-C  amLODipine (NORVASC) 10 MG tablet Take 10 mg by mouth daily. 02/23/17   [provider]  cetirizine (ZYRTEC) 10 MG tablet Take 1 tablet (10 mg total) by mouth daily. 04/28/18   Scot Jun, FNP  doxycycline (VIBRAMYCIN) 100 MG capsule Take 1 capsule (100 mg total) by mouth 2 (two) times daily. 05/12/18   Tasia Catchings, Amy V, PA-C  fluticasone (FLONASE) 50 MCG/ACT nasal spray Place 2 sprays into both  nostrils daily. 05/12/18   Tasia Catchings, Amy V, PA-C  hydrochlorothiazide (HYDRODIURIL) 12.5 MG tablet Take 2 tablets (25 mg total) by mouth daily. 06/19/17   Lavonia Drafts, MD  ipratropium (ATROVENT) 0.06 % nasal spray Place 2 sprays into both nostrils 4 (four) times daily. 05/12/18   Tasia Catchings, Amy V, PA-C  metFORMIN (GLUCOPHAGE) 500 MG tablet Take 1 tablet (500 mg total) by mouth 2 (two) times daily with a meal. 06/19/17   Lavonia Drafts, MD  Multiple Vitamins-Minerals (MULTIVITAMIN PO) Take 1 tablet by mouth daily.    [provider]  tamoxifen (NOLVADEX) 20 MG tablet Take 1 tablet (20 mg total) by mouth daily. 09/10/17   Nicholas Lose, MD    Family History Family History  Problem Relation Age  of Onset  . Hypertension Mother   . Diabetes Mother   . Breast cancer Paternal Grandmother        dx in her 110s  . Prostate cancer Paternal Grandfather   . Prostate cancer Father 6  . Prostate cancer Paternal Uncle   . Breast cancer Other        paternal grandmother's sister  . Breast cancer Paternal Aunt   . Anesthesia problems Neg Hx   . Amblyopia Neg Hx   . Blindness Neg Hx   . Cataracts Neg Hx   . Glaucoma Neg Hx   . Macular degeneration Neg Hx   . Retinal detachment Neg Hx   . Strabismus Neg Hx   . Retinitis pigmentosa Neg Hx     Social History Social History   Tobacco Use  . Smoking status: Never Smoker  . Smokeless tobacco: Never Used  Substance Use Topics  . Alcohol use: Yes    Alcohol/week: 1.0 standard drinks    Types: 1 Cans of beer per week    Comment: occa  . Drug use: No     Allergies   Shrimp [shellfish allergy] and Aleve [naproxen]   Review of Systems Review of Systems  Reason unable to perform ROS: See HPI as above.     Physical Exam Triage Vital Signs ED Triage Vitals  Enc Vitals Group     BP 05/12/18 1012 133/79     Pulse Rate 05/12/18 1012 (!) 109     Resp 05/12/18 1012 18     Temp 05/12/18 1012 97.7 F (36.5 C)     Temp Source 05/12/18 1012 Oral     SpO2 05/12/18 1012 96 %     Weight --      Height --      Head Circumference --      Peak Flow --      Pain Score 05/12/18 1013 5     Pain Loc --      Pain Edu? --      Excl. in [place]? --    No data found.  Updated Vital Signs BP 133/79 (BP Location: Left Arm)   Pulse (!) 109 Comment: Reported HR to CMA Melissa White  Temp 97.7 F (36.5 C) (Oral)   Resp 18   LMP 05/27/2017 (Approximate)   SpO2 96%   Physical Exam Constitutional:      General: She is not in acute distress.    Appearance: She is well-developed. She is not ill-appearing, toxic-appearing or diaphoretic.  HENT:     Head: Normocephalic and atraumatic.     Right Ear: Tympanic membrane, ear canal and external  ear normal. Tympanic membrane is not erythematous or bulging.     Left  Ear: Tympanic membrane, ear canal and external ear normal. Tympanic membrane is not erythematous or bulging.     Nose: Rhinorrhea present. No congestion.     Right Sinus: No maxillary sinus tenderness or frontal sinus tenderness.     Left Sinus: No maxillary sinus tenderness or frontal sinus tenderness.     Mouth/Throat:     Mouth: Mucous membranes are moist.     Pharynx: Oropharynx is clear. Uvula midline.  Eyes:     Conjunctiva/sclera: Conjunctivae normal.     Pupils: Pupils are equal, round, and reactive to light.  Neck:     Musculoskeletal: Normal range of motion and neck supple.  Cardiovascular:     Rate and Rhythm: Normal rate and regular rhythm.     Heart sounds: Normal heart sounds. No murmur. No friction rub. No gallop.   Pulmonary:     Effort: Pulmonary effort is normal. No respiratory distress.     Breath sounds: Normal breath sounds. No stridor. No decreased breath sounds, wheezing, rhonchi or rales.  Lymphadenopathy:     Cervical: No cervical adenopathy.  Skin:    General: Skin is warm and dry.  Neurological:     Mental Status: She is alert and oriented to person, place, and time.  Psychiatric:        Behavior: Behavior normal.        Judgment: Judgment normal.      UC Treatments / Results  Labs (all labs ordered are listed, but only abnormal results are displayed) Labs Reviewed - No data to display  EKG None  Radiology No results found.  Procedures Procedures (including critical care time)  Medications Ordered in UC Medications - No data to display  Initial Impression / Assessment and Plan / UC Course  I have reviewed the triage vital signs and the nursing notes.  Pertinent labs & imaging results that were available during my care of the patient were reviewed by me and considered in my medical decision making (see chart for details).    Will cover for bronchitis with  doxycycline. Continue prednisone as directed. Albuterol inhaler refilled. Other symptomatic treatment discussed. Push fluids. Return precautions given.  Final Clinical Impressions(s) / UC Diagnoses   Final diagnoses:  Bronchitis    ED Prescriptions    Medication Sig Dispense Auth. Provider   albuterol (PROAIR HFA) 108 (90 Base) MCG/ACT inhaler Inhale 2 puffs into the lungs every 6 (six) hours as needed for shortness of breath. 18 g Yu, Amy V, PA-C   ipratropium (ATROVENT) 0.06 % nasal spray Place 2 sprays into both nostrils 4 (four) times daily. 15 mL Yu, Amy V, PA-C   fluticasone (FLONASE) 50 MCG/ACT nasal spray Place 2 sprays into both nostrils daily. 1 g Yu, Amy V, PA-C   doxycycline (VIBRAMYCIN) 100 MG capsule Take 1 capsule (100 mg total) by mouth 2 (two) times daily. 20 capsule Tobin Chad, [place] 05/12/18 1038

## 2018-05-12 NOTE — Discharge Instructions (Signed)
Start doxycycline as directed. Albuterol inhaler as needed. Tessalon for cough. Start flonase, atrovent nasal spray for nasal congestion/drainage. You can use over the counter nasal saline rinse such as neti pot for nasal congestion. Keep hydrated, your urine should be clear to pale yellow in color. Tylenol/motrin for fever and pain. Monitor for any worsening of symptoms, chest pain, shortness of breath, wheezing, swelling of the throat, follow up for reevaluation.   For sore throat/cough try using a honey-based tea. Use 3 teaspoons of honey with juice squeezed from half lemon. Place shaved pieces of ginger into 1/2-1 cup of water and warm over stove top. Then mix the ingredients and repeat every 4 hours as needed.

## 2018-05-12 NOTE — ED Triage Notes (Signed)
Pt presents with non productive cough and congestion; pt has had no relief with OTC medication.

## 2018-05-14 ENCOUNTER — Ambulatory Visit (INDEPENDENT_AMBULATORY_CARE_PROVIDER_SITE_OTHER): Payer: Self-pay | Admitting: Family Medicine

## 2018-05-14 ENCOUNTER — Encounter: Payer: Self-pay | Admitting: Family Medicine

## 2018-05-14 ENCOUNTER — Ambulatory Visit (INDEPENDENT_AMBULATORY_CARE_PROVIDER_SITE_OTHER): Payer: Medicaid Other

## 2018-05-14 VITALS — BP 126/86 | HR 82 | Resp 17 | Ht 62.0 in | Wt 241.0 lb

## 2018-05-14 DIAGNOSIS — M25562 Pain in left knee: Secondary | ICD-10-CM | POA: Diagnosis not present

## 2018-05-14 DIAGNOSIS — M25561 Pain in right knee: Secondary | ICD-10-CM

## 2018-05-14 DIAGNOSIS — E781 Pure hyperglyceridemia: Secondary | ICD-10-CM

## 2018-05-14 DIAGNOSIS — G8929 Other chronic pain: Secondary | ICD-10-CM | POA: Diagnosis not present

## 2018-05-14 DIAGNOSIS — E786 Lipoprotein deficiency: Secondary | ICD-10-CM

## 2018-05-14 MED ORDER — HYDROCHLOROTHIAZIDE 12.5 MG PO TABS: 25.0000 mg | ORAL_TABLET | Freq: Every day | ORAL | 3 refills | Status: DC

## 2018-05-14 MED ORDER — MELOXICAM 15 MG PO TABS: 15.0000 mg | ORAL_TABLET | Freq: Every day | ORAL | 0 refills | Status: DC

## 2018-05-14 MED ORDER — ATORVASTATIN CALCIUM 10 MG PO TABS: 10.0000 mg | ORAL_TABLET | Freq: Every day | ORAL | 3 refills | Status: DC

## 2018-05-14 NOTE — Progress Notes (Addendum)
Patient ID: Sheryl Cox, female    DOB: 10-Feb-1971, 48 y.o.   MRN: [id number]  PCP: Scot Jun, FNP  Chief Complaint  Patient presents with  . Knee Pain    Subjective:  HPI  NAZIYAH Cox is a 48 y.o. female presents for evaluation bilateral knee pain with left knee swelling. Left knee is causing the most significant problem. Weakness experienced with getting into cars and rising from a sitting to standing position. She feels as if both knees are going to "give out". Over the last month, she has experienced worsening left knee pain. Concern that she is going to fall. Last imaging of left knee per EMR  10/29/2016: Findings: mild degenerative changes with joint space narrowing and osteophytosis. Impression: Mild degenerative changes  Social History   Socioeconomic History  . Marital status: Single    Spouse name: Not on file  . Number of children: 1  . Years of education: Not on file  . Highest education level: Not on file  Occupational History  . Occupation: WESTERN Product manager: [employer]  Social Needs  . Financial resource strain: Not on file  . Food insecurity:    Worry: Not on file    Inability: Not on file  . Transportation needs:    Medical: Not on file    Non-medical: Not on file  Tobacco Use  . Smoking status: Never Smoker  . Smokeless tobacco: Never Used  Substance and Sexual Activity  . Alcohol use: Yes    Alcohol/week: 1.0 standard drinks    Types: 1 Cans of beer per week    Comment: occa  . Drug use: No  . Sexual activity: Not Currently    Birth control/protection: None    Comment: last intercourse > 1 year.  Lifestyle  . Physical activity:    Days per week: Not on file    Minutes per session: Not on file  . Stress: Not on file  Relationships  . Social connections:    Talks on phone: Not on file    Gets together: Not on file    Attends religious service: Not on file    Active member of club or  organization: Not on file    Attends meetings of clubs or organizations: Not on file    Relationship status: Not on file  . Intimate partner violence:    Fear of current or ex partner: Not on file    Emotionally abused: Not on file    Physically abused: Not on file    Forced sexual activity: Not on file  Other Topics Concern  . Not on file  Social History Narrative  . Not on file    Family History  Problem Relation Age of Onset  . Hypertension Mother   . Diabetes Mother   . Breast cancer Paternal Grandmother        dx in her 3s  . Prostate cancer Paternal Grandfather   . Prostate cancer Father 25  . Prostate cancer Paternal Uncle   . Breast cancer Other        paternal grandmother's sister  . Breast cancer Paternal Aunt   . Anesthesia problems Neg Hx   . Amblyopia Neg Hx   . Blindness Neg Hx   . Cataracts Neg Hx   . Glaucoma Neg Hx   . Macular degeneration Neg Hx   . Retinal detachment Neg Hx   . Strabismus Neg Hx   . Retinitis  pigmentosa Neg Hx    Review of Systems Pertinent negatives listed in HPI Patient Active Problem List   Diagnosis Date Noted  . Anemia 06/05/2013  . 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 Breast cancer of upper-outer quadrant of right female breast ([place]) 11/24/2012  . Nabothian cyst 12/07/2011  . Cervical mass 11/20/2011  . Fibroid uterus 11/20/2011    Allergies  Allergen Reactions  . Shrimp [Shellfish Allergy] Anaphylaxis and Swelling  . Aleve [Naproxen] Hypertension    MD told the patient to not take this because it will elevate her B/P    Prior to Admission medications   Medication Sig Start Date End Date Taking? Authorizing Provider  albuterol (PROAIR HFA) 108 (90 Base) MCG/ACT inhaler Inhale 2 puffs into the lungs every 6 (six) hours as needed for shortness of breath. 05/12/18  Yes Yu, Amy V, PA-C  amLODipine (NORVASC) 10 MG tablet Take 10 mg by mouth daily. 02/23/17  Yes [provider]  cetirizine (ZYRTEC) 10 MG  tablet Take 1 tablet (10 mg total) by mouth daily. 04/28/18  Yes Scot Jun, FNP  doxycycline (VIBRAMYCIN) 100 MG capsule Take 1 capsule (100 mg total) by mouth 2 (two) times daily. 05/12/18  Yes Yu, Amy V, PA-C  fluticasone (FLONASE) 50 MCG/ACT nasal spray Place 2 sprays into both nostrils daily. 05/12/18  Yes Yu, Amy V, PA-C  hydrochlorothiazide (HYDRODIURIL) 12.5 MG tablet Take 2 tablets (25 mg total) by mouth daily. 06/19/17  Yes Lavonia Drafts, MD  ipratropium (ATROVENT) 0.06 % nasal spray Place 2 sprays into both nostrils 4 (four) times daily. 05/12/18  Yes Yu, Amy V, PA-C  metFORMIN (GLUCOPHAGE) 500 MG tablet Take 1 tablet (500 mg total) by mouth 2 (two) times daily with a meal. 06/19/17  Yes Harraway-Smith, Hoyle Sauer, MD  Multiple Vitamins-Minerals (MULTIVITAMIN PO) Take 1 tablet by mouth daily.   Yes [provider]  tamoxifen (NOLVADEX) 20 MG tablet Take 1 tablet (20 mg total) by mouth daily. 09/10/17  Yes Nicholas Lose, MD    Past Medical, Surgical Family and Social History reviewed and updated.    Objective:   Today's Vitals   05/14/18 0952  BP: 126/86  Pulse: 82  Resp: 17  SpO2: 97%  Weight: 241 lb (109.3 kg)  Height: 5\' 2"  (1.575 m)    Wt Readings from Last 3 Encounters:  05/14/18 241 lb (109.3 kg)  04/24/18 244 lb 3.2 oz (110.8 kg)  12/30/17 241 lb 3.2 oz (109.4 kg)   Physical Exam General appearance: alert, well developed, well nourished, cooperative and in no distress Head: Normocephalic, without obvious abnormality, atraumatic Respiratory: Respirations even and unlabored, normal respiratory rate Heart: rate and rhythm normal. No gallop or murmurs noted on exam  Extremities: Right knee: no tenderness or crepitus noted: Left knee: tenderness palpable MCL and trace pateller edema noted. Skin: Skin color, texture, turgor normal. No rashes seen  Psych: Appropriate mood and affect. Neurologic: Mental status: Alert, oriented to person, place, and time,  thought content appropriate.  Lab Results  Component Value Date   POCGLU 95 12/02/2015    Lab Results  Component Value Date   HGBA1C 6.3 (H) 05/08/2018        Assessment & Plan:  1. Chronic pain of both knees -Referring patient to orthopedic surgery based on current presentation and review of prior left imaging. Patient has trace edema of left knee and notable distress with positional changes. Obtaining updated imaging to assess for any dramatic changes since last  imaging from 2018: - DG Knee Complete 4 Views Right; Future - DG Knee Complete 4 Views Left; Future -Trial Meloxicam for knee pain and warm compresses as needed.    Orders Placed This Encounter  Procedures  . DG Knee Complete 4 Views Right  . DG Knee Complete 4 Views Left  . Ambulatory referral to Orthopedic Surgery    Meds ordered this encounter  Medications  . atorvastatin (LIPITOR) 10 MG tablet    Sig: Take 1 tablet (10 mg total) by mouth daily at 6 PM.    Dispense:  90 tablet    Refill:  3  . meloxicam (MOBIC) 15 MG tablet    Sig: Take 1 tablet (15 mg total) by mouth daily.    Dispense:  30 tablet    Refill:  0    Tolerates this medication  . hydrochlorothiazide (HYDRODIURIL) 12.5 MG tablet    Sig: Take 2 tablets (25 mg total) by mouth daily.    Dispense:  30 tablet    Refill:  3     -The patient was given clear instructions to go to ER or return to medical center if symptoms do not improve, worsen or new problems develop. The patient verbalized understanding.    Molli Barrows, FNP [facility] [address], [place] [phone]: [phone]

## 2018-05-14 NOTE — Patient Instructions (Addendum)
You will be notified via phone regarding you imaging results of your knee x-rays.  I have included information regarding healthy food selection items to improve overall cardiovascular health and facilitate weight loss.     Heart Disease Prevention Heart disease is the leading cause of death in the world. Coronary artery disease is the most common cause of heart disease. This condition results when cholesterol and other substances (plaque) build up inside the walls of the blood vessels that supply your heart muscle (arteries). This buildup in arteries is called atherosclerosis. You can take actions to lower your risk of heart disease. How can heart disease affect me? Heart disease can cause many unpleasant symptoms and complications, such as:  Chest pain (angina).  Reduced or blocked blood flow to your heart. This can cause: ? Irregular heartbeats (arrhythmias). ? Heart attack. ? Heart failure. What can increase my risk? The following factors may make you more likely to develop this condition:  High blood pressure (hypertension).  High cholesterol.  Smoking.  A diet high in saturated fats or trans fats.  Lack of physical activity.  Obesity.  Drinking too much alcohol.  Diabetes.  Having a family history of heart disease. What actions can I take to prevent heart disease? Nutrition   Eat a heart-healthy eating plan as told by your health care provider. Examples include the DASH (Dietary Approaches to Stop Hypertension) eating plan or the Mediterranean diet.  Generally, it is recommended that you: ? Eat less salt (sodium). Ask your health care provider how much sodium is safe for you. Most people should have less than 2,300 mg each day. ? Limit unhealthy fats, such as saturated and trans fats, in your diet. You can do this by eating low-fat dairy products, eating less red meat, and avoiding processed foods. ? Eat healthy fats (omega-3 fatty acids). These are found in fish,  such as mackerel or salmon. ? Eat more fruits and vegetables. You should try to fill one-half of your plate with fruits and vegetables at each meal. ? Eat more whole grains. ? Avoid foods and drinks that have added sugars. Lifestyle   Get regular exercise. This is one of the most important things you can do for your health. Generally, it is recommended that you: ? Exercise for at least 30 minutes on most days of the week (150 minutes each week). The exercise should increase your heart rate and make you sweat (aerobic exercise). ? Add strength exercises on at least 2 days each week.  Do not use any products that contain nicotine or tobacco, such as cigarettes and e-cigarettes. These can damage your heart and blood vessels. If you need help quitting, ask your health care provider. Alcohol use  Do not drink alcohol if: ? Your health care provider tells you not to drink. ? You are pregnant, may be pregnant, or are planning to become pregnant.  If you drink alcohol, limit how much you have: ? 0-1 drink a day for women. ? 0-2 drinks a day for men.  Be aware of how much alcohol is in your drink. In the [place] one drink equals one typical bottle of beer (12 oz), one-half glass of wine (5 oz), or one shot of hard liquor (1 oz). Medicines  Take over-the-counter and prescription medicines only as told by your health care provider.  Ask your health care provider whether you should take an aspirin every day. Taking aspirin may help reduce your risk of heart disease and stroke.  Depending  on your risk factors, your health care provider may prescribe medicines to lower your risk of heart disease or to control related conditions. You may take medicine to: ? Lower cholesterol. ? Control blood pressure. ? Control diabetes. General information  Keep your blood pressure under control, as recommended by your health care provider. For most healthy people, the upper number of your blood pressure  (systolic) should be no higher than 120, and the lower number (diastolic) no higher than 80. Treatment may be needed if your blood pressure is higher than 130/80.  Have your blood pressure checked at least every two years. Your health care provider may check your blood pressure more often if you have high blood pressure.  After age 21, have your cholesterol checked every 4-6 years. If you have risk factors for heart disease, you may need to have it checked more frequently. Treatment may be needed if your cholesterol is high.  Have your body mass index (BMI) checked every year. Your health care provider can calculate your BMI from your height and weight.  Work with your health care provider to lose weight, if needed, or to maintain a healthy weight. Where to find more information:  Centers for Disease Control and Prevention: TextNotebook.fi  American Heart Association: www.heart.org ? Take a free online heart disease risk quiz to better understand your personal risk factors. Summary  Heart disease is the leading cause of death in the world.  Heart disease can cause chest pain, abnormal heart rhythms, heart attack, and heart failure.  High blood pressure, high cholesterol, and smoking are the main risk factors for heart disease, although other factors also contribute.  You can take actions to lower your chances of developing heart disease. Work with your health care provider to reduce your risk by following a heart-healthy diet, being physically active, and controlling your weight, blood pressure, and cholesterol level. This information is not intended to replace advice given to you by your health care provider. Make sure you discuss any questions you have with your health care provider. Document Released: 10/18/2003 Document Revised: 03/20/2017 Document Reviewed: 03/20/2017 Elsevier Interactive Patient Education  2019 [place].     Acute Knee Pain, Adult Many things can  cause knee pain. Sometimes, knee pain is sudden (acute) and may be caused by damage, swelling, or irritation of the muscles and tissues that support your knee. The pain often goes away on its own with time and rest. If the pain does not go away, tests may be done to find out what is causing the pain. Follow these instructions at home: Pay attention to any changes in your symptoms. Take these actions to relieve your pain. If you have a knee sleeve or brace:   Wear the sleeve or brace as told by your doctor. Remove it only as told by your doctor.  Loosen the sleeve or brace if your toes: ? Tingle. ? Become numb. ? Turn cold and blue.  Keep the sleeve or brace clean.  If the sleeve or brace is not waterproof: ? Do not let it get wet. ? Cover it with a watertight covering when you take a bath or shower. Activity  Rest your knee.  Do not do things that cause pain.  Avoid activities where both feet leave the ground at the same time (high-impact activities). Examples are running, jumping rope, and doing jumping jacks.  Work with a physical therapist to make a safe exercise program, as told by your doctor. Managing pain, stiffness,  and swelling   If told, put ice on the knee: ? Put ice in a plastic bag. ? Place a towel between your skin and the bag. ? Leave the ice on for 20 minutes, 2-3 times a day.  If told, put pressure (compression) on your injured knee to control swelling, give support, and help with discomfort. Compression may be done with an elastic bandage. General instructions  Take all medicines only as told by your doctor.  Raise (elevate) your knee while you are sitting or lying down. Make sure your knee is higher than your heart.  Sleep with a pillow under your knee.  Do not use any products that contain nicotine or tobacco. These include cigarettes, e-cigarettes, and chewing tobacco. These products may slow down healing. If you need help quitting, ask your  doctor.  If you are overweight, work with your doctor and a food expert (dietitian) to set goals to lose weight. Being overweight can make your knee hurt more.  Keep all follow-up visits as told by your doctor. This is important. Contact a doctor if:  The knee pain does not stop.  The knee pain changes or gets worse.  You have a fever along with knee pain.  Your knee feels warm when you touch it.  Your knee gives out or locks up. Get help right away if:  Your knee swells, and the swelling gets worse.  You cannot move your knee.  You have very bad knee pain. Summary  Many things can cause knee pain. The pain often goes away on its own with time and rest.  Your doctor may do tests to find out the cause of the pain.  Pay attention to any changes in your symptoms. Relieve your pain with rest, medicines, light activity, and use of ice.  Get help right away if you cannot move your knee or your knee pain is very bad. This information is not intended to replace advice given to you by your health care provider. Make sure you discuss any questions you have with your health care provider. Document Released: 06/01/2008 Document Revised: 08/15/2017 Document Reviewed: 08/15/2017 Elsevier Interactive Patient Education  2019 [place] in the Home After knee surgery, it is important to follow instructions from your health care provider about rehabilitation (rehab). It is important to design a program that is safe and effective for you. Your health care provider and rehabilitation therapist will work with you to meet your specific abilities and needs. What are the benefits? Knee rehab can help to:  Strengthen your knee.  Improve the flexibility and movement (range of motion) of your knee joint.  Reduce swelling.  Improve blood flow and prevent blood clots. How to do exercises at home   Continue exercises at home that your health care provider or physical  therapist instructed you to do in the hospital.  Before you exercise: ? Take pain medicines, if told by your health care provider. Do not take the medicine if it makes you feel dizzy or sleepy. ? Do a warm-up activity, such as gentle walking or riding a stationary bike, as told by your health care provider. Doing that warms up your muscles and helps to prevent injury.  While doing exercises: ? When standing, make sure you are near something sturdy that you can hold onto for balance, such as a heavy chair or the wall. ? Do exercises exactly as told by your health care provider and adjust them as directed. ? As  you are recovering, choose an exercise pace that is comfortable for you, and gradually work up to your goal. ? Do not force your knee to bend.  Do not exercise in a pool (aquatic therapy) until your incision is healed and your health care provider says that you can. Follow these instructions at home: Activity  Do not use your knee to support (bear) your body weight until your health care provider says that you can. Follow weight-bearing restrictions as told. Use crutches or a walker as told by your health care provider.  Do not twist or kneel on your injured knee.  Ask your health care provider what activities are safe for you during recovery, and ask what activities you need to avoid.  Avoid sitting for a long time without moving. Get up to take short walks every 1-2 hours. Ask for help if you feel weak or unsteady. Managing pain, stiffness, and swelling   Put ice on affected areas after you exercise, or as needed. Icing can help to relieve joint pain and swelling. ? Put ice in a plastic bag or use the icing device (cold flow pad or cold therapy unit) that you were given. Follow instructions from your health care provider about how to use the icing device. ? Place a towel between your skin and the bag or device. ? Leave the ice on for 20 minutes, 2-3 times a day.  If directed,  apply heat to affected areas before you exercise, or as needed. Heat can reduce the stiffness of your muscles and joints. Use the heat source that your health care provider recommends, such as a moist heat pack or a heating pad. ? Place a towel between your skin and the heat source. ? Leave the heat on for 20-30 minutes. ? Remove the heat if your skin turns bright red. This is especially important if you are unable to feel pain, heat, or cold. You may have a greater risk of getting burned.  Wear compression stockings as told by your health care provider. These stockings help to prevent blood clots and reduce swelling in your legs.  Raise (elevate) your legs while sitting or lying down. Do not place your knee on top of pillows to elevate it. Keep your legs straight to prevent your knee from getting stuck in a bent position (contracture). Preventing falls   Keep your home well-lit and clutter-free, especially in walkways and stairways. Keep floors dry and use non-skid mats.  Remove tripping hazards from floors, such as throw rugs and cords.  Install grab bars in bathrooms, and put night-lights in your bedroom and bathroom.  Wear closed-toe shoes that fit well and support your feet. Wear shoes that have rubber soles or low heels.  Talk with your health care provider about the over-the-counter and prescription medicines that you are taking. Some medicines can cause dizziness or changes in blood pressure, which increase your risk of falling. General recommendations  Teach your family about your condition and how they can participate in your recovery. Include them during a physical therapy session.  Keep all follow-up visits as told by your health care provider and physical therapist. This is important. Questions to ask your health care provider  What exercises are safe for me to do?  How often should I do the exercises?  How can I manage the pain during exercise?  What other activities  are safe for me to do? Contact a health care provider if:  You have questions about how to  do exercises correctly.  You have increased difficulty bending your knee.  You have knee pain that does not go away after you rest and take pain medicines.  Your prosthesis feels loose.  You are not able to do exercises. Get help right away if:  You fall.  Your incision from surgery breaks open. Summary  Knee rehab can help to strengthen your knee and improve the flexibility and movement (range of motion) of your knee joint.  Continue exercises at home that your health care provider or physical therapist instructed you to do in the hospital.  Call your health care provider if you have questions about how to do exercises correctly. This information is not intended to replace advice given to you by your health care provider. Make sure you discuss any questions you have with your health care provider. Document Released: 03/05/2005 Document Revised: 01/28/2017 Document Reviewed: 01/28/2017 Elsevier Interactive Patient Education  2019 [employer].

## 2018-05-15 ENCOUNTER — Telehealth: Payer: Self-pay | Admitting: Family Medicine

## 2018-05-15 NOTE — Telephone Encounter (Signed)
Notify patient recent knee imaging show arthritis changes in both knees with the left knee worst than right. Referral placed to orthopedics as discussed.

## 2018-05-16 NOTE — Telephone Encounter (Signed)
Patient notified of xray results & recommendations. Expressed understanding. Is scheduled to see Ortho on 05/21/2018

## 2018-05-21 ENCOUNTER — Encounter (INDEPENDENT_AMBULATORY_CARE_PROVIDER_SITE_OTHER): Payer: Self-pay | Admitting: Orthopaedic Surgery

## 2018-05-21 ENCOUNTER — Ambulatory Visit (INDEPENDENT_AMBULATORY_CARE_PROVIDER_SITE_OTHER): Payer: Self-pay | Admitting: Orthopaedic Surgery

## 2018-05-21 ENCOUNTER — Ambulatory Visit (INDEPENDENT_AMBULATORY_CARE_PROVIDER_SITE_OTHER): Payer: Medicaid Other | Admitting: Orthopaedic Surgery

## 2018-05-21 VITALS — BP 126/103 | HR 95 | Ht 63.0 in | Wt 248.0 lb

## 2018-05-21 DIAGNOSIS — Z6841 Body Mass Index (BMI) 40.0 and over, adult: Secondary | ICD-10-CM

## 2018-05-21 DIAGNOSIS — M25561 Pain in right knee: Secondary | ICD-10-CM

## 2018-05-21 DIAGNOSIS — G8929 Other chronic pain: Secondary | ICD-10-CM | POA: Insufficient documentation

## 2018-05-21 DIAGNOSIS — M25511 Pain in right shoulder: Secondary | ICD-10-CM

## 2018-05-21 DIAGNOSIS — M25562 Pain in left knee: Secondary | ICD-10-CM

## 2018-05-21 NOTE — Progress Notes (Signed)
Office Visit Note   Patient: Sheryl Cox           Date of Birth: 25-Feb-1971           MRN: [id number] Visit Date: 05/21/2018              Requested by: Scot Jun, [address],  [id number] PCP: Scot Jun, FNP   Assessment & Plan: Visit Diagnoses:  1. Chronic pain of both knees   2. Chronic right shoulder pain     Plan: Bilateral knee pain with films consistent with osteoarthritis predominantly in the medial compartment.  Long discussion regarding 9 weightbearing exercises and weight loss with a BMI presently at 44.  Taking Mobic per her primary care physician which she may continue.  Could consider cortisone injections would like to limit that as she is diabetic.  Right shoulder pain with positive impingement and empty can testing.  Has had cortisone with persistent pain over the past 2 months.  Will order MRI scan  Follow-Up Instructions: No follow-ups on file.   Orders:  No orders of the defined types were placed in this encounter.  No orders of the defined types were placed in this encounter.     Procedures: No procedures performed   Clinical Data: No additional findings.   Subjective: Chief Complaint  Patient presents with  . Right Shoulder - Pain  . Left Knee - Pain  . Right Knee - Pain  Sheryl Cox is 48 years old visited the office evaluation bilateral knee and right shoulder pain.  She first noted onset of right shoulder pain at the end of December.  She was working at [employer] with a lot of repetitive activity overhead.  She denies a specific injury or trauma.  She has been seen by her primary care physician with "cortisone injections.  She has had minimal relief.  She is diabetic and is concerned about her blood sugars with the cortisone.  She has not worked since the end of December.  She is not had any numbness or tingling.  No prior surgery.  Bilateral knee pain is been present for "years".  The  pain is predominantly on the medial compartment and oftentimes associated with soreness and stiffness.  Occasionally she may have some swelling.  On a rare basis she might have a sensation of her knees giving way.  She has had prior films of her right shoulder and both knees.  I reviewed these on the PACS system.  Acute changes in her right shoulder.  Type I acromium and minimal degenerative changes of the chromic clavicular joint.  Humeral head is centered about the glenoid.  There is a normal space between the humeral head and the acromion.  Bilateral knee films demonstrate degenerative changes predominant medial compartment with narrowing of the joint and peripheral osteophytes.  There are some about the patellofemoral joints as well.  HPI  Review of Systems   Objective: Vital Signs: BP (!) 126/103   Pulse 95   Ht 5\' 3"  (1.6 m)   Wt 248 lb (112.5 kg)   LMP 05/27/2017 (Approximate)   BMI 43.93 kg/m   Physical Exam Constitutional:      Appearance: She is well-developed.  Eyes:     Pupils: Pupils are equal, round, and reactive to light.  Pulmonary:     Effort: Pulmonary effort is normal.  Skin:    General: Skin is warm and dry.  Neurological:  Mental Status: She is alert and oriented to person, place, and time.  Psychiatric:        Behavior: Behavior normal.     Ortho Exam awake alert and oriented x3.  Comfortable sitting.  Examination of the right shoulder reveals positive impingement empty can testing.  Able to place her right arm fully overhead and flexion and abduction.  Skin intact.  Good grip and good release.  Straight leg raise negative.  Painless range of motion both hips.  No medial joint pain both knees.  Mild patellar crepitation.  No pain about the patella of the lateral compartments.  Full extension.  Very minimal varus with weightbearing.  Full extension and flexed about 100 degrees without instability  Specialty Comments:  No specialty comments  available.  Imaging: No results found.   PMFS History: Patient Active Problem List   Diagnosis Date Noted  . Chronic pain of both knees 05/21/2018  . Chronic right shoulder pain 05/21/2018  . Anemia 06/05/2013  . 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 Breast cancer of upper-outer quadrant of right female breast ([facility]) 11/24/2012  . Nabothian cyst 12/07/2011  . Cervical mass 11/20/2011  . Fibroid uterus 11/20/2011   Past Medical History:  Diagnosis Date  . Anemia    history of blood transfusion-no abnormal reaction noted  . Asthma    Albuterol as needed  . Breast cancer ([facility]) 11/2012   DCIS-ER+, PR+.Takes Tamoxifen daily  . Diabetes mellitus without complication ([facility])    takes Metformin daily  . History of migraine    several yrs ago  . Hypertension    takes Tribenzor daily    Family History  Problem Relation Age of Onset  . Hypertension Mother   . Diabetes Mother   . Breast cancer Paternal Grandmother        dx in her 55s  . Prostate cancer Paternal Grandfather   . Prostate cancer Father 45  . Prostate cancer Paternal Uncle   . Breast cancer Other        paternal grandmother's sister  . Breast cancer Paternal Aunt   . Anesthesia problems Neg Hx   . Amblyopia Neg Hx   . Blindness Neg Hx   . Cataracts Neg Hx   . Glaucoma Neg Hx   . Macular degeneration Neg Hx   . Retinal detachment Neg Hx   . Strabismus Neg Hx   . Retinitis pigmentosa Neg Hx     Past Surgical History:  Procedure Laterality Date  . BIOPSY BREAST Left 12/05/12   fibroadenoma  . BREAST LUMPECTOMY     2014  . BREAST SURGERY Right   . CESAREAN SECTION  01/05/2009  . CHOLECYSTECTOMY N/A 01/31/2016   Procedure: LAPAROSCOPIC CHOLECYSTECTOMY;  Surgeon: Erroll Luna, MD;  Location: [facility];  Service: General;  Laterality: N/A;  . DILITATION & CURRETTAGE/HYSTROSCOPY WITH HYDROTHERMAL ABLATION N/A 08/12/2013   Procedure: DILATATION & CURETTAGE/HYSTEROSCOPY WITH HYDROTHERMAL ABLATION;  Surgeon:  Frederico Hamman, MD;  Location: [facility];  Service: Gynecology;  Laterality: N/A;  . ROBOTIC ASSISTED TOTAL HYSTERECTOMY N/A 06/18/2017   Procedure: XI ROBOTIC ASSISTED TOTAL HYSTERECTOMY;  Surgeon: Lavonia Drafts, MD;  Location[facility];  Service: Gynecology;  Laterality: N/A;  . SALPINGOOPHORECTOMY Bilateral 06/18/2017   Procedure: BILATERAL SALPINGO OOPHORECTOMY;  Surgeon: Lavonia Drafts, MD;  [facility];  Service: Gynecology;  Laterality: Bilateral;   Social History   Occupational History  . 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  Tobacco  Use  . Smoking status: Never Smoker  . Smokeless tobacco: Never Used  Substance and Sexual Activity  . Alcohol use: Yes    Alcohol/week: 1.0 standard drinks    Types: 1 Cans of beer per week    Comment: occa  . Drug use: No  . Sexual activity: Not Currently    Birth control/protection: None    Comment: last intercourse > 1 year.     Garald Balding, MD   Note - This record has been created using [employer].  Chart creation errors have been sought, but may not always  have been located. Such creation errors do not reflect on  the standard of medical care.
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
## 2018-05-23 ENCOUNTER — Telehealth (INDEPENDENT_AMBULATORY_CARE_PROVIDER_SITE_OTHER): Payer: Self-pay | Admitting: Radiology

## 2018-05-23 NOTE — Telephone Encounter (Signed)
Called and left message for patient to call back with phone number on back of [employer] insurance card so we can check on authorization. I was only able to find front scan of card in system.

## 2018-05-30 ENCOUNTER — Ambulatory Visit: Payer: Medicaid Other | Admitting: Family Medicine

## 2018-05-30 MED FILL — AMLODIPINE BESYLATE 10 MG T: 10 | 30 days supply | Qty: 30 | Fill #2

## 2018-05-30 MED FILL — HYDROCHLOROTHIAZIDE 12.5 MG: 12.5 | 15 days supply | Qty: 30 | Fill #0

## 2018-05-30 MED FILL — metFORMIN HCL 500 MG TABS: 500 | 30 days supply | Qty: 60 | Fill #1

## 2018-05-30 MED FILL — ATORVASTATIN 10 MG TABLET: 10 | 30 days supply | Qty: 30 | Fill #0

## 2018-05-30 MED FILL — MELOXICAM 15 MG TABLET: 15 | 30 days supply | Qty: 30 | Fill #0

## 2018-06-02 ENCOUNTER — Telehealth (INDEPENDENT_AMBULATORY_CARE_PROVIDER_SITE_OTHER): Payer: Self-pay | Admitting: Orthopaedic Surgery

## 2018-06-02 ENCOUNTER — Telehealth: Payer: Self-pay | Admitting: Family Medicine

## 2018-06-02 ENCOUNTER — Other Ambulatory Visit (INDEPENDENT_AMBULATORY_CARE_PROVIDER_SITE_OTHER): Payer: Self-pay | Admitting: Orthopaedic Surgery

## 2018-06-02 MED ORDER — DIAZEPAM 5 MG PO TABS: 5.0000 mg | ORAL_TABLET | Freq: Once | ORAL | 0 refills | Status: AC

## 2018-06-02 NOTE — Telephone Encounter (Signed)
Left voicemail advising patient of the information below.

## 2018-06-02 NOTE — Telephone Encounter (Signed)
Patient called requesting a Rx of Valium [facility] [address], patient states she is claustrophobic and has a MRI scheduled for tomorrow.

## 2018-06-02 NOTE — Telephone Encounter (Signed)
Patient called stating she is scheduled for MRI tomorrow morning 3/17 and is claustrophobic.  Patient is requesting a [facility] of [facility] [address].

## 2018-06-02 NOTE — Telephone Encounter (Signed)
Sent to [facility]

## 2018-06-02 NOTE — Telephone Encounter (Signed)
Please see below.

## 2018-06-02 NOTE — Telephone Encounter (Signed)
Patient needs to contact the ordering providing (which I see she's done already) regarding this request, Dr. Durward Fortes.

## 2018-06-03 ENCOUNTER — Ambulatory Visit
Admission: RE | Admit: 2018-06-03 | Discharge: 2018-06-03 | Disposition: A | Discharge status: Home / Self Care | Payer: Medicaid Other | Source: Ambulatory Visit | Attending: Orthopaedic Surgery | Admitting: Orthopaedic Surgery

## 2018-06-03 ENCOUNTER — Other Ambulatory Visit: Payer: Self-pay

## 2018-06-03 DIAGNOSIS — G8929 Other chronic pain: Secondary | ICD-10-CM

## 2018-06-03 DIAGNOSIS — M25511 Pain in right shoulder: Principal | ICD-10-CM

## 2018-06-04 ENCOUNTER — Telehealth: Payer: Self-pay | Admitting: *Deleted

## 2018-06-04 NOTE — Telephone Encounter (Signed)
Yes to CT

## 2018-06-04 NOTE — Telephone Encounter (Signed)
Patient was not able to perform MRI due to claustrophobia w/meds, meds made patient sick. Would you like to order CT or conscious sedation MRI? Please advise.

## 2018-06-05 ENCOUNTER — Other Ambulatory Visit (INDEPENDENT_AMBULATORY_CARE_PROVIDER_SITE_OTHER): Payer: Self-pay | Admitting: Orthopaedic Surgery

## 2018-06-05 ENCOUNTER — Ambulatory Visit (INDEPENDENT_AMBULATORY_CARE_PROVIDER_SITE_OTHER): Payer: Medicaid Other | Admitting: Orthopaedic Surgery

## 2018-06-05 DIAGNOSIS — M25511 Pain in right shoulder: Principal | ICD-10-CM

## 2018-06-05 DIAGNOSIS — G8929 Other chronic pain: Secondary | ICD-10-CM

## 2018-06-05 NOTE — Telephone Encounter (Signed)
Called patient. No answer. Left message on machine that CT scan of her right shoulder has been ordered.

## 2018-06-05 NOTE — Telephone Encounter (Signed)
Please order. Thank you. 

## 2018-07-09 ENCOUNTER — Other Ambulatory Visit: Payer: BLUE CROSS/BLUE SHIELD

## 2018-07-16 ENCOUNTER — Ambulatory Visit: Payer: Medicaid Other | Admitting: Family Medicine

## 2018-08-13 ENCOUNTER — Other Ambulatory Visit: Payer: Self-pay

## 2018-08-13 ENCOUNTER — Emergency Department (HOSPITAL_COMMUNITY)
Admission: EM | Admit: 2018-08-13 | Discharge: 2018-08-13 | Disposition: A | Discharge status: Home / Self Care | Payer: Medicaid Other | Attending: Emergency Medicine | Admitting: Emergency Medicine

## 2018-08-13 ENCOUNTER — Encounter (HOSPITAL_COMMUNITY): Payer: Self-pay | Admitting: Emergency Medicine

## 2018-08-13 ENCOUNTER — Emergency Department (HOSPITAL_COMMUNITY): Payer: Medicaid Other

## 2018-08-13 DIAGNOSIS — J45909 Unspecified asthma, uncomplicated: Secondary | ICD-10-CM | POA: Diagnosis not present

## 2018-08-13 DIAGNOSIS — I10 Essential (primary) hypertension: Secondary | ICD-10-CM | POA: Insufficient documentation

## 2018-08-13 DIAGNOSIS — Z79899 Other long term (current) drug therapy: Secondary | ICD-10-CM | POA: Diagnosis not present

## 2018-08-13 DIAGNOSIS — R51 Headache: Secondary | ICD-10-CM | POA: Insufficient documentation

## 2018-08-13 DIAGNOSIS — E119 Type 2 diabetes mellitus without complications: Secondary | ICD-10-CM | POA: Diagnosis not present

## 2018-08-13 DIAGNOSIS — R519 Headache, unspecified: Secondary | ICD-10-CM

## 2018-08-13 DIAGNOSIS — Z7984 Long term (current) use of oral hypoglycemic drugs: Secondary | ICD-10-CM | POA: Insufficient documentation

## 2018-08-13 DIAGNOSIS — Z791 Long term (current) use of non-steroidal anti-inflammatories (NSAID): Secondary | ICD-10-CM | POA: Insufficient documentation

## 2018-08-13 LAB — CBC
HCT: 37.9 % (ref 36.0–46.0)
Hemoglobin: 11.9 g/dL — ABNORMAL LOW (ref 12.0–15.0)
MCH: 24.4 pg — ABNORMAL LOW (ref 26.0–34.0)
MCHC: 31.4 g/dL (ref 30.0–36.0)
MCV: 77.8 fL — ABNORMAL LOW (ref 80.0–100.0)
Platelets: 378 10*3/uL (ref 150–400)
RBC: 4.87 MIL/uL (ref 3.87–5.11)
RDW: 15.3 % (ref 11.5–15.5)
WBC: 8.1 10*3/uL (ref 4.0–10.5)
nRBC: 0 % (ref 0.0–0.2)

## 2018-08-13 LAB — BASIC METABOLIC PANEL
Anion gap: 11 (ref 5–15)
BUN: 15 mg/dL (ref 6–20)
CO2: 26 mmol/L (ref 22–32)
Calcium: 9.9 mg/dL (ref 8.9–10.3)
Chloride: 99 mmol/L (ref 98–111)
Creatinine, Ser: 0.78 mg/dL (ref 0.44–1.00)
GFR calc Af Amer: >60 mL/min (ref >60)
GFR calc non Af Amer: >60 mL/min (ref >60)
Glucose, Bld: 118 mg/dL — ABNORMAL HIGH (ref 70–99)
Potassium: 3.4 mmol/L — ABNORMAL LOW (ref 3.5–5.1)
Sodium: 136 mmol/L (ref 135–145)

## 2018-08-13 MED ORDER — METOCLOPRAMIDE HCL 5 MG/ML IJ SOLN
10.0000 mg | Freq: Once | INTRAMUSCULAR | Status: AC
  Administered 2018-08-13: 10 mg via INTRAVENOUS
  Filled 2018-08-13: qty 2

## 2018-08-13 MED ORDER — SODIUM CHLORIDE 0.9 % IV BOLUS
1000.0000 mL | Freq: Once | INTRAVENOUS | Status: AC
  Administered 2018-08-13: 1000 mL via INTRAVENOUS

## 2018-08-13 MED ORDER — MORPHINE SULFATE (PF) 4 MG/ML IV SOLN
4.0000 mg | Freq: Once | INTRAVENOUS | Status: AC
  Administered 2018-08-13: 4 mg via INTRAVENOUS
  Filled 2018-08-13: qty 1

## 2018-08-13 NOTE — ED Triage Notes (Signed)
Patient c/o headache x4 days. Denies N/V/D, cough, fever, SOB. Hx migraines.

## 2018-08-13 NOTE — Discharge Instructions (Signed)
You have been seen today for headache. Please read and follow all provided instructions.   1. Medications: tylenol/ibuprofen for headache, usual home medications 2. Treatment: rest, drink plenty of fluids 3. Follow Up: Please follow up with your primary doctor in 2 days for discussion of your diagnoses and further evaluation after today's visit; if you do not have a primary care doctor use the resource guide provided to find one; Please return to the ER for any new or worsening symptoms. Please obtain all of your results from medical records or have your doctors office obtain the results - share them with your doctor - you should be seen at your doctors office. Call today to arrange your follow up.   Take medications as prescribed. Please review all of the medicines and only take them if you do not have an allergy to them. Return to the emergency room for worsening condition or new concerning symptoms. Follow up with your regular doctor. If you don't have a regular doctor use one of the numbers below to establish a primary care doctor.  Please be aware that if you are taking birth control pills, taking other prescriptions, ESPECIALLY ANTIBIOTICS may make the birth control ineffective - if this is the case, either do not engage in sexual activity or use alternative methods of birth control such as condoms until you have finished the medicine and your family doctor says it is OK to restart them. If you are on a blood thinner such as COUMADIN, be aware that any other medicine that you take may cause the coumadin to either work too much, or not enough - you should have your coumadin level rechecked in next 7 days if this is the case.  ?  It is also a possibility that you have an allergic reaction to any of the medicines that you have been prescribed - Everybody reacts differently to medications and while MOST people have no trouble with most medicines, you may have a reaction such as nausea, vomiting, rash,  swelling, shortness of breath. If this is the case, please stop taking the medicine immediately and contact your physician.  ?  You should return to the ER if you develop severe or worsening symptoms.   Emergency Department Resource Guide 1) Find a Doctor and Pay Out of Pocket Although you won't have to find out who is covered by your insurance plan, it is a good idea to ask around and get recommendations. You will then need to call the office and see if the doctor you have chosen will accept you as a new patient and what types of options they offer for patients who are self-pay. Some doctors offer discounts or will set up payment plans for their patients who do not have insurance, but you will need to ask so you aren't surprised when you get to your appointment.  2) Contact Your Local Health Department Not all health departments have doctors that can see patients for sick visits, but many do, so it is worth a call to see if yours does. If you don't know where your local health department is, you can check in your phone book. The CDC also has a tool to help you locate your state's health department, and many state websites also have listings of all of their local health departments.  3) Find a [facility] If your illness is not likely to be very severe or complicated, you may want to try a walk in clinic. These are popping up  all over the country in pharmacies, drugstores, and shopping centers. They're usually staffed by nurse practitioners or physician assistants that have been trained to treat common illnesses and complaints. They're usually fairly quick and inexpensive. However, if you have serious medical issues or chronic medical problems, these are probably not your best option.  No Primary Care Doctor: Call Health Connect at  [phone] - they can help you locate a primary care doctor that  accepts your insurance, provides certain services, etc. Physician Referral Service-  [phone]  Chronic Pain Problems: Organization         Address  Phone   Notes  [facility]  [phone] Patients need to be referred by their primary care doctor.   Medication Assistance: Organization         Address  Phone   Notes  [facility] [address] [place], [place] [address] [phone] --Must be a resident of [facility] -- Must have NO insurance coverage whatsoever (no Medicaid/ Medicare, etc.) -- The pt. MUST have a primary care doctor that directs their care regularly and follows them in the community   [facility]  [phone]   [employer]  [phone]    Agencies that provide inexpensive medical care: Organization         Address  Phone   Notes  [place]  [phone]   [facility]    [phone]   [facility] [place], [place] [address] [phone]   [place] [address] [place] [phone]   [facility]    [phone]   [facility]    [phone]   [place] and [place] [address], [place] Phone:  [phone], Fax:  [phone] Hours of Operation:  9 am - 6 pm, M-F.  Also accepts Medicaid/Medicare and self-pay.  [facility] for [place] [address], [address], [place] Phone: [phone], Fax: [phone]. Hours of Operation:  8:30 am - 5:30 pm, M-F.  Also accepts Medicaid and self-pay.  [facility] [address] [place] Phone: [phone]   [place], [place], [place] [phone], Ext. [phone] Mondays & Thursdays: 7-9 AM.  First 15 patients are seen on a first come, first serve basis.    [place] Providers:  Organization         Address  Phone   Notes  [facility] [address],  [place] [phone] Also accepts self-pay patients.  [facility] [address], [place]  [phone]   [place], [address], [place] [phone]   [facility] [address] [place] [phone]   Lucianne Lei [address], [place]   [phone] Only accepts [place] [facility] [place] patients after they have their name applied to their card.   Self-Pay (no insurance) in [facility]:  Organization         Address  Phone   Notes  Sickle Cell Patients, [facility] [place] [phone]   [facility] [place] [phone]   [facility] [place]  [address], [place] [phone]   [facility]/Dr. Osei-Bonsu  [address], [place] or [address], [place] [phone] Phone number for both [place] and [place] locations is the same.  [facility] [address] [place] [phone]   [facility] [address], [place] or [address] [phone]   [facility] [address] [place] [phone], phone; [phone], fax Sees patients 1st and 3rd Saturday of every month.  Must not qualify for public or private insurance (i.e. Medicaid, Medicare, [place] Health Choice, Veterans' Benefits)  Household income should be no more than 200% of the poverty level The clinic cannot treat you if you are pregnant or think you are pregnant  Sexually transmitted diseases are not treated at the clinic.

## 2018-08-13 NOTE — ED Provider Notes (Signed)
[facility] DEPT Provider Note   CSN: [id number] Arrival date & time: 08/13/18  2039    History   Chief Complaint Chief Complaint  Patient presents with  . Headache    HPI Sheryl Cox is a 48 y.o. female with a PMH of Diabetes, migraines, asthma, HTN, and anemia presenting with an intermittent right sided non radiating headache onset 4 days ago. Patient describes headache as pressure and states nothing makes it worse. Patient reports massaging head makes pain better. Patient reports intermittent blurry vision. Patient denies blurry vision currently. Patient states she has tried tylenol without relief. Patient reports she was evaluated by PCP and advised to get a head CT. Patient states headache is slightly worse than previous headaches. Patient denies weakness, numbness, dizziness, lightheadedness, or syncope. Patient denies fever, rash, neck pain, chills, cough, congestion, nausea, vomiting, abdominal pain, or diarrhea. Patient denies sick contacts.      HPI  Past Medical History:  Diagnosis Date  . Anemia    history of blood transfusion-no abnormal reaction noted  . Asthma    Albuterol as needed  . 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 Diabetes mellitus without complication ([place])    takes Metformin daily  . History of migraine    several yrs ago  . Hypertension    takes Tribenzor daily    Patient Active Problem List   Diagnosis Date Noted  . Chronic pain of both knees 05/21/2018  . Chronic right shoulder pain 05/21/2018  . BMI 40.0-44.9, adult ([place]) 05/21/2018  . Anemia 06/05/2013  . 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 Breast cancer of upper-outer quadrant of right female breast ([place]) 11/24/2012  . Nabothian cyst 12/07/2011  . Cervical mass 11/20/2011  . Fibroid uterus 11/20/2011    Past Surgical History:  Procedure Laterality Date  . BIOPSY BREAST Left 12/05/12   fibroadenoma  . BREAST  LUMPECTOMY     2014  . BREAST SURGERY Right   . CESAREAN SECTION  01/05/2009  . CHOLECYSTECTOMY N/A 01/31/2016   Procedure: LAPAROSCOPIC CHOLECYSTECTOMY;  Surgeon: Erroll Luna, MD;  Location: [facility];  Service: General;  Laterality: N/A;  . DILITATION & CURRETTAGE/HYSTROSCOPY WITH HYDROTHERMAL ABLATION N/A 08/12/2013   Procedure: DILATATION & CURETTAGE/HYSTEROSCOPY WITH HYDROTHERMAL ABLATION;  Surgeon: Frederico Hamman, MD;  Location: [facility];  Service: Gynecology;  Laterality: N/A;  . ROBOTIC ASSISTED TOTAL HYSTERECTOMY N/A 06/18/2017   Procedure: XI ROBOTIC ASSISTED TOTAL HYSTERECTOMY;  Surgeon: Lavonia Drafts, MD;  Location: [facility];  Service: Gynecology;  Laterality: N/A;  . SALPINGOOPHORECTOMY Bilateral 06/18/2017   Procedure: BILATERAL SALPINGO OOPHORECTOMY;  Surgeon: Lavonia Drafts, MD;  Location: [facility];  Service: Gynecology;  Laterality: Bilateral;     OB History    Gravida  1   Para  1   Term  1   Preterm  0   AB  0   Living  1     SAB  0   TAB  0   Ectopic  0   Multiple  0   Live Births  1            Home Medications    Prior to Admission medications   Medication Sig Start Date End Date Taking? Authorizing Provider  acetaminophen (TYLENOL) 325 MG tablet Take 650 mg by mouth daily as needed for headache.   Yes [provider]  albuterol (PROAIR HFA) 108 (90 Base) MCG/ACT inhaler Inhale 2 puffs into the  lungs every 6 (six) hours as needed for shortness of breath. 05/12/18  Yes Yu, Amy V, PA-C  amLODipine (NORVASC) 10 MG tablet Take 10 mg by mouth daily. 02/23/17  Yes [provider]  atorvastatin (LIPITOR) 10 MG tablet Take 1 tablet (10 mg total) by mouth daily at 6 PM. 05/14/18  Yes Scot Jun, FNP  cetirizine (ZYRTEC) 10 MG tablet Take 1 tablet (10 mg total) by mouth daily. 04/28/18  Yes Scot Jun, FNP  fluticasone (FLONASE) 50 MCG/ACT nasal spray Place 2 sprays into both nostrils daily. 05/12/18  Yes Yu, Amy  V, PA-C  hydrochlorothiazide (HYDRODIURIL) 12.5 MG tablet Take 2 tablets (25 mg total) by mouth daily. Patient taking differently: Take 12.5 mg by mouth daily.  05/14/18  Yes Scot Jun, FNP  ipratropium (ATROVENT) 0.06 % nasal spray Place 2 sprays into both nostrils 4 (four) times daily. 05/12/18  Yes Yu, Amy V, PA-C  meloxicam (MOBIC) 15 MG tablet Take 1 tablet (15 mg total) by mouth daily. 05/14/18  Yes Scot Jun, FNP  metFORMIN (GLUCOPHAGE) 500 MG tablet Take 1 tablet (500 mg total) by mouth 2 (two) times daily with a meal. 06/19/17  Yes Lavonia Drafts, MD  Multiple Vitamins-Minerals (MULTIVITAMIN PO) Take 1 tablet by mouth daily.   Yes [provider]  tamoxifen (NOLVADEX) 20 MG tablet Take 1 tablet (20 mg total) by mouth daily. 09/10/17  Yes Nicholas Lose, MD  doxycycline (VIBRAMYCIN) 100 MG capsule Take 1 capsule (100 mg total) by mouth 2 (two) times daily. Patient not taking: Reported on 08/13/2018 05/12/18   Arturo Morton    Family History Family History  Problem Relation Age of Onset  . Hypertension Mother   . Diabetes Mother   . Breast cancer Paternal Grandmother        dx in her 82s  . Prostate cancer Paternal Grandfather   . Prostate cancer Father 56  . Prostate cancer Paternal Uncle   . Breast cancer Other        paternal grandmother's sister  . Breast cancer Paternal Aunt   . Anesthesia problems Neg Hx   . Amblyopia Neg Hx   . Blindness Neg Hx   . Cataracts Neg Hx   . Glaucoma Neg Hx   . Macular degeneration Neg Hx   . Retinal detachment Neg Hx   . Strabismus Neg Hx   . Retinitis pigmentosa Neg Hx     Social History Social History   Tobacco Use  . Smoking status: Never Smoker  . Smokeless tobacco: Never Used  Substance Use Topics  . Alcohol use: Yes    Alcohol/week: 1.0 standard drinks    Types: 1 Cans of beer per week    Comment: occa  . Drug use: No     Allergies   Shrimp [shellfish allergy] and Aleve [naproxen]    Review of Systems Review of Systems  Constitutional: Negative for activity change, appetite change, chills, diaphoresis, fatigue, fever and unexpected weight change.  HENT: Negative for congestion, ear pain, sinus pressure, sinus pain and sore throat.   Eyes: Positive for visual disturbance. Negative for photophobia.  Respiratory: Negative for shortness of breath.   Cardiovascular: Negative for chest pain.  Gastrointestinal: Negative for abdominal pain, nausea and vomiting.  Musculoskeletal: Negative for gait problem, myalgias, neck pain and neck stiffness.  Skin: Negative for rash.  Allergic/Immunologic: Negative for immunocompromised state.  Neurological: Positive for headaches. Negative for dizziness, seizures, syncope, speech difficulty, weakness and  numbness.  Hematological: Does not bruise/bleed easily.  Psychiatric/Behavioral: Negative for confusion.     Physical Exam Updated Vital Signs BP 114/77 (BP Location: Left Arm)   Pulse 99   Temp 98.7 F (37.1 C) (Oral)   Resp 16   LMP 05/27/2017 (Approximate)   SpO2 99%   Physical Exam Vitals signs and nursing note reviewed.  Constitutional:      General: She is not in acute distress.    Appearance: She is well-developed. She is not diaphoretic.  HENT:     Head: Normocephalic and atraumatic.  Neck:     Musculoskeletal: Normal range of motion.  Cardiovascular:     Rate and Rhythm: Normal rate and regular rhythm.     Heart sounds: Normal heart sounds. No murmur. No friction rub. No gallop.   Pulmonary:     Effort: Pulmonary effort is normal. No respiratory distress.     Breath sounds: Normal breath sounds. No wheezing or rales.  Abdominal:     Palpations: Abdomen is soft.     Tenderness: There is no abdominal tenderness.  Musculoskeletal: Normal range of motion.  Skin:    General: Skin is warm.     Findings: No erythema or rash.  Neurological:     Mental Status: She is alert.    Mental Status:  Alert, oriented,  thought content appropriate, able to give a coherent history. Speech fluent without evidence of aphasia. Able to follow 2 step commands without difficulty.  Cranial Nerves:  II:  Peripheral visual fields grossly normal, pupils equal, round, reactive to light III,IV, VI: ptosis not present, extra-ocular motions intact bilaterally  V,VII: smile symmetric, facial light touch sensation equal VIII: hearing grossly normal to voice  IX,X: symmetric elevation of soft palate, uvula elevates symmetrically  XI: bilateral shoulder shrug symmetric and strong XII: midline tongue extension without fassiculations Motor:  Normal tone. 5/5 in upper and lower extremities bilaterally including strong and equal grip strength and dorsiflexion/plantar flexion Sensory: Pinprick and light touch normal in all extremities.  Deep Tendon Reflexes: 2+ and symmetric in the biceps and patella Cerebellar: normal finger-to-nose with bilateral upper extremities Gait: normal gait and balance.  Negative pronator drift. Negative Romberg sign. CV: distal pulses palpable throughout   ED Treatments / Results  Labs (all labs ordered are listed, but only abnormal results are displayed) Labs Reviewed  BASIC METABOLIC PANEL - Abnormal; Notable for the following components:      Result Value   Potassium 3.4 (*)    Glucose, Bld 118 (*)    All other components within normal limits  CBC - Abnormal; Notable for the following components:   Hemoglobin 11.9 (*)    MCV 77.8 (*)    MCH 24.4 (*)    All other components within normal limits    EKG None  Radiology Ct Head Wo Contrast  Result Date: 08/13/2018 CLINICAL DATA:  Headaches for several days EXAM: CT HEAD WITHOUT CONTRAST TECHNIQUE: Contiguous axial images were obtained from the base of the skull through the vertex without intravenous contrast. COMPARISON:  None. FINDINGS: Brain: No evidence of acute infarction, hemorrhage, hydrocephalus, extra-axial collection or mass  lesion/mass effect. Vascular: No hyperdense vessel or unexpected calcification. Skull: Normal. Negative for fracture or focal lesion. Sinuses/Orbits: No acute finding. Other: None. IMPRESSION: Normal head CT Electronically Signed   By: Inez Catalina M.D.   On: 08/13/2018 21:55    Procedures Procedures (including critical care time)  Medications Ordered in ED Medications  metoCLOPramide (REGLAN) injection  10 mg (10 mg Intravenous Given 08/13/18 2138)  morphine 4 MG/ML injection 4 mg (4 mg Intravenous Given 08/13/18 2140)  sodium chloride 0.9 % bolus 1,000 mL (1,000 mLs Intravenous New Bag/Given 08/13/18 2152)     Initial Impression / Assessment and Plan / ED Course  I have reviewed the triage vital signs and the nursing notes.  Pertinent labs & imaging results that were available during my care of the patient were reviewed by me and considered in my medical decision making (see chart for details).  Clinical Course as of Aug 13 2307  Wed Aug 13, 2018  2159 Normal head CT.   CT HEAD WO CONTRAST [AH]  2213 Low hemoglobin noted at 11.9. This appears to be baseline and similar to previous values.   Hemoglobin(!): 11.9 [AH]  2229 Patient reports headache has improved while in the ER. Discussed findings with patient.   [AH]    Clinical Course User Index [AH] Arville Lime, PA-C      Patient presents with a headache. Pt HA treated and improved while in ED.  Presentation is like pts typical HA and non concerning for [facility], ICH, Meningitis, or temporal arteritis. Patient did report headache was worse than previous migraines and CT head was negative. Pt is afebrile with no focal neuro deficits, rash, or nuchal rigidity. Pt is to follow up with PCP to discuss prophylactic medication. Pt verbalizes understanding and is agreeable with plan to dc.   Final Clinical Impressions(s) / ED Diagnoses   Final diagnoses:  Bad headache    ED Discharge Orders    None       Arville Lime, [place]  08/13/18 2310    Carmin Muskrat, MD 08/13/18 2316

## 2018-08-15 ENCOUNTER — Other Ambulatory Visit: Payer: Self-pay | Admitting: Orthopaedic Surgery

## 2018-08-19 ENCOUNTER — Other Ambulatory Visit: Payer: Medicaid Other

## 2018-08-20 ENCOUNTER — Ambulatory Visit
Admission: RE | Admit: 2018-08-20 | Discharge: 2018-08-20 | Disposition: A | Discharge status: Home / Self Care | Payer: Medicaid Other | Source: Ambulatory Visit | Attending: Orthopaedic Surgery | Admitting: Orthopaedic Surgery

## 2018-08-20 ENCOUNTER — Other Ambulatory Visit: Payer: Self-pay

## 2018-08-20 DIAGNOSIS — M25511 Pain in right shoulder: Secondary | ICD-10-CM

## 2018-08-20 DIAGNOSIS — G8929 Other chronic pain: Secondary | ICD-10-CM

## 2018-08-25 ENCOUNTER — Telehealth: Payer: Self-pay | Admitting: Family Medicine

## 2018-08-25 ENCOUNTER — Telehealth: Payer: Self-pay | Admitting: Orthopaedic Surgery

## 2018-08-25 NOTE — Telephone Encounter (Signed)
Please advise 

## 2018-08-25 NOTE — Telephone Encounter (Signed)
Patient should have called Orth[facility] for these results since that's who ordered the CT. Looked in chart & she did reach out to them for results.

## 2018-08-25 NOTE — Telephone Encounter (Signed)
Pt called  for results regarding CT scan please follow up

## 2018-08-25 NOTE — Telephone Encounter (Signed)
Patient called requesting a return call with the results of her CT scan on 08/20/18.  Patient did not want to schedule an appointment and requested the results be given to her over the phone.

## 2018-08-26 ENCOUNTER — Other Ambulatory Visit: Payer: Self-pay | Admitting: Orthopaedic Surgery

## 2018-08-26 DIAGNOSIS — G8929 Other chronic pain: Secondary | ICD-10-CM

## 2018-08-26 NOTE — Telephone Encounter (Signed)
Called results-please set up PT at [facility] on [address] right shoulder

## 2018-08-26 NOTE — Telephone Encounter (Signed)
Please order. Thank you. 

## 2018-08-28 ENCOUNTER — Ambulatory Visit: Payer: Medicaid Other | Attending: Orthopaedic Surgery | Admitting: Physical Therapy

## 2018-08-28 ENCOUNTER — Encounter: Payer: Self-pay | Admitting: Physical Therapy

## 2018-08-28 ENCOUNTER — Other Ambulatory Visit: Payer: Self-pay

## 2018-08-28 DIAGNOSIS — M6281 Muscle weakness (generalized): Secondary | ICD-10-CM | POA: Diagnosis not present

## 2018-08-28 DIAGNOSIS — M25511 Pain in right shoulder: Secondary | ICD-10-CM | POA: Insufficient documentation

## 2018-08-28 DIAGNOSIS — G8929 Other chronic pain: Secondary | ICD-10-CM

## 2018-08-28 NOTE — Therapy (Signed)
[place], [place], [address] Phone: [phone]   Fax:  [phone]  Physical Therapy Treatment  Patient Details  Name: Sheryl Cox MRN: [id number] Date of Birth: January 26, 1971 Referring Provider (PT): Dr. Durward Fortes    Encounter Date: 08/28/2018  PT End of Session - 08/28/18 0925    Visit Number  1    Number of Visits  4    Date for PT Re-Evaluation  09/18/18    Authorization Type  MCD    Authorization - Visit Number  1    Authorization - Number of Visits  4    PT Start Time  0838    PT Stop Time  0927    PT Time Calculation (min)  49 min    Activity Tolerance  Patient tolerated treatment well    Behavior During Therapy  [facility] for tasks assessed/performed       Past Medical History:  Diagnosis Date  . Anemia    history of blood transfusion-no abnormal reaction noted  . Asthma    Albuterol as needed  . 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 Diabetes mellitus without complication ([place])    takes Metformin daily  . History of migraine    several yrs ago  . Hypertension    takes Tribenzor daily    Past Surgical History:  Procedure Laterality Date  . BIOPSY BREAST Left 12/05/12   fibroadenoma  . BREAST LUMPECTOMY     2014  . BREAST SURGERY Right   . CESAREAN SECTION  01/05/2009  . CHOLECYSTECTOMY N/A 01/31/2016   Procedure: LAPAROSCOPIC CHOLECYSTECTOMY;  Surgeon: Erroll Luna, MD;  Location: [facility];  Service: General;  Laterality: N/A;  . DILITATION & CURRETTAGE/HYSTROSCOPY WITH HYDROTHERMAL ABLATION N/A 08/12/2013   Procedure: DILATATION & CURETTAGE/HYSTEROSCOPY WITH HYDROTHERMAL ABLATION;  Surgeon: Frederico Hamman, MD;  Location: [facility];  Service: Gynecology;  Laterality: N/A;  . ROBOTIC ASSISTED TOTAL HYSTERECTOMY N/A 06/18/2017   Procedure: XI ROBOTIC ASSISTED TOTAL HYSTERECTOMY;  Surgeon: Lavonia Drafts, MD;  Location: [facility];  Service: Gynecology;   Laterality: N/A;  . SALPINGOOPHORECTOMY Bilateral 06/18/2017   Procedure: BILATERAL SALPINGO OOPHORECTOMY;  Surgeon: Lavonia Drafts, MD;  Location: [facility];  Service: Gynecology;  Laterality: Bilateral;    There were no vitals filed for this visit.  Subjective Assessment - 08/28/18 0841    Subjective  Patient presents with pain in bilateral shoulder R>L  for many years, chronic and worsening.  She was told she had bursitis and arthritis.  She has difficulty using her arms, lifting and pulling.  She has pain thorughout her shoulder and down to her fingers.  Her R arm feels weak aslo numb and tingling.    Pertinent History  knee pain , headaches/migraines    Limitations  House hold activities;Lifting;Walking;Sitting    Diagnostic tests  Recent CT scan showed mild AC joint arthropathy, bursitis    Patient Stated Goals  Pain relief, use my limbs, get out of chair normal, move my arm.    Currently in Pain?  Yes    Pain Score  10-Worst pain ever    Pain Location  Shoulder    Pain Orientation  Right    Pain Descriptors / Indicators  Sharp;Aching    Pain Type  Chronic pain    Pain Radiating Towards  lower Rt Arm    Pain Onset  More than a month ago    Pain Frequency  Intermittent  Aggravating Factors   PM, laying on it, using it    Pain Relieving Factors  medications, does not use ice or heat , rest , pillows, sling    Effect of Pain on Daily Activities  limits comfort         [facility] PT Assessment - 08/28/18 0001      Assessment   Medical Diagnosis  R shoulder pain     Referring Provider (PT)  Dr. Durward Fortes     Hand Dominance  Right    Next MD Visit  does not know     Prior Therapy  long ago       Precautions   Precautions  None      Restrictions   Weight Bearing Restrictions  No      Balance Screen   Has the patient fallen in the past 6 months  No    Has the patient had a decrease in activity level because of a fear of falling?   No    Is the patient reluctant to leave  their home because of a fear of falling?   No      Home Environment   Living Environment  Private residence    Living Arrangements  Children    Type of Home  Apartment    Additional Comments  has a 49 yr old dtr      Prior Function   Level of Independence  Independent    Vocation  Unemployed    Optician, dispensing and Charity fundraiser     Leisure  playing with her child, being outdoor       Cognition   Overall Cognitive Status  Within Functional Limits for tasks assessed      Observation/Other Assessments   Focus on Therapeutic Outcomes (FOTO)   NT due to MCD       Sensation   Light Touch  Appears Intact      Posture/Postural Control   Posture/Postural Control  Postural limitations    Postural Limitations  Rounded Shoulders;Forward head      AROM   Right Shoulder Extension  32 Degrees    Right Shoulder Flexion  92 Degrees    Right Shoulder ABduction  65 Degrees    Right Shoulder Internal Rotation  --   reach to Rt buttock    Right Shoulder External Rotation  --   reach to side of Rt head    Left Shoulder Extension  40 Degrees    Left Shoulder Flexion  146 Degrees    Left Shoulder ABduction  145 Degrees    Left Shoulder Internal Rotation  --   reach to low back    Left Shoulder External Rotation  --   reach to T1     PROM   Overall PROM Comments  Rt shoulder flexion 110, abd 90 deg, ER 20 and IR 50       Strength   Right Shoulder Flexion  3+/5    Right Shoulder ABduction  3/5    Right Shoulder Internal Rotation  4-/5    Right Shoulder External Rotation  3+/5    Left Shoulder Flexion  3+/5    Left Shoulder ABduction  3/5    Left Shoulder Internal Rotation  4+/5    Left Shoulder External Rotation  4+/5      Palpation   Palpation comment  pain anterolateral Rt shoulder , tender in deltoid and surrounding musculature  PT Education - 08/28/18 0927    Education Details  PT/POC, HEP, ice, how to make your own ice pack    Person(s)  Educated  Patient    Methods  Explanation;Demonstration;Verbal cues;Handout    Comprehension  Verbalized understanding;Returned demonstration       PT Short Term Goals - 08/28/18 0927      PT SHORT TERM GOAL #1   Title  pt will be I with initial HEP    Baseline  unknown, given on eval    Time  3    Period  Weeks    Status  New    Target Date  09/18/18      PT SHORT TERM GOAL #2   Title  pt will be able to raise her Rt arm to 110 deg for improved ADLs    Baseline  92 deg flexion , 65 abduction    Time  3    Period  Weeks    Status  New    Target Date  09/18/18      PT SHORT TERM GOAL #3   Title  Pt will be able to report use of ice at home for pain control    Baseline  does not have or use    Time  3    Period  Weeks    Status  New    Target Date  09/18/18      PT SHORT TERM GOAL #4   Title  Pt will report overall less pain with ADLs and normal housework <7/10    Baseline  patient was a 10/10 today    Time  6    Period  Weeks    Status  New    Target Date  09/18/18               Plan - 08/28/18 7829    Clinical Impression Statement  Patient presents for mod complexity eval of Rt shoulder pain due to bursitits and OA (per CT ). She presents with weakness bilaterally, stiffness and pain.  Icie did improved her pain post session and she was able to perform HEP at the table without too much pain.    Personal Factors and Comorbidities  Comorbidity 1;Comorbidity 2;Time since onset of injury/illness/exacerbation    Comorbidities  knee pain    Examination-Activity Limitations  Lift;Reach Overhead;Transfers;Sleep    Examination-Participation Restrictions  Community Activity    Stability/Clinical Decision Making  Evolving/Moderate complexity    Clinical Decision Making  Moderate    Rehab Potential  Good    PT Frequency  2x / week    PT Duration  2 weeks   then 2 x 6 if improving   PT Treatment/Interventions  ADLs/Self Care Home Management;Electrical  Stimulation;Patient/family education;Taping;Therapeutic exercise;Iontophoresis 4mg /ml Dexamethasone;Moist Heat;Cryotherapy;Ultrasound;Functional mobility training;Manual techniques;Passive range of motion;Dry needling    PT Next Visit Plan  check HEP, modalities for pain, AAROM as tolerate    PT Home Exercise Plan  table slides ER, flex, scaption, scap squeeze    Consulted and Agree with Plan of Care  Patient       Patient will benefit from skilled therapeutic intervention in order to improve the following deficits and impairments:  Increased fascial restricitons, Pain, Postural dysfunction, Decreased mobility, Decreased range of motion, Decreased strength, Obesity  Visit Diagnosis:      Problem List Patient Active Problem List   Diagnosis Date Noted  . Chronic pain of both knees 05/21/2018  . Chronic right shoulder pain 05/21/2018  .  BMI 40.0-44.9, adult ([place]) 05/21/2018  . Anemia 06/05/2013  . 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 Breast cancer of upper-outer quadrant of right female breast ([place]) 11/24/2012  . Nabothian cyst 12/07/2011  . Cervical mass 11/20/2011  . Fibroid uterus 11/20/2011    PAA,JENNIFER 08/28/2018, 10:30 AM  [facility] [place], [place], [address] Phone: [phone]   Fax:  [phone]  Name: Sheryl Cox MRN: [id number] Date of Birth: 03-27-1970  Raeford Razor, PT 08/28/18 10:30 AM Phone: [phone] Fax: [phone]

## 2018-08-28 NOTE — Patient Instructions (Signed)
Step 1  Step 2  Seated Shoulder Scaption Slide at Table Top with Forearm in Neutral reps: 10  sets: 1  hold: 5  daily: 3  weekly: 7 Setup  Begin sitting to the side of a table with the side of your hand resting on the surface. Movement  Slowly bend sideways diagonally, allowing your hand to slide at roughly a 30-degree angle across the table until you feel a gentle stretch in your shoulder. Return to the starting position and repeat. Tip  Make sure to keep your movements slow and avoid shrugging your shoulder. Do not move through pain or reach or push with your arm during the movement. You can use a towel to help your hand slide on the surface as needed. Step 1  Step 2  Seated Shoulder Flexion Slide at Table Top with Forearm in Neutral reps: 10  sets: 1  hold: 5  daily: 3  weekly: 7 Setup  Begin sitting tall, facing a table or counter top with the side of your hand resting on the surface. Movement  Slowly lean forward, allowing your hand to slide across the table until you feel a gentle stretch in your shoulder. Return to the starting position and repeat. Tip  Make sure to keep your movements slow and avoid shrugging your shoulder. Do not move through pain or reach or push with your arm during the movement. You can use a towel to help your hand slide on the surface as needed. Step 1  Step 2  Step 3  Step 4  Seated Shoulder External Rotation PROM on Table reps: 10  sets: 1  hold: 5  daily: 3  weekly: 7 Setup  Begin sitting upright with your forearm resting on a table or bed at the level of your elbow. Your palm should be flat. Movement  Keeping your arm on the table, bend your upper body forward until a stretch is felt in your shoulder and hold. Tip  Make sure to keep your back straight during the exercise and arm resting flat on the table. Step 1  Step 2  Seated Scapular Retraction reps: 10  sets: 1  hold: 10  daily: 3  weekly: 7 Setup  Begin sitting in an upright  position. Movement  Gently squeeze your shoulder blades together, relax, and then repeat. Tip  Make sure to maintain good posture during the exercise.

## 2018-09-01 ENCOUNTER — Ambulatory Visit: Payer: Medicaid Other | Admitting: Physical Therapy

## 2018-09-04 ENCOUNTER — Telehealth: Payer: Self-pay | Admitting: Hematology and Oncology

## 2018-09-04 NOTE — Telephone Encounter (Signed)
6/25 f/u converted to [employer]ximity video confirmed with patient.

## 2018-09-04 NOTE — Assessment & Plan Note (Signed)
Right breast DCIS ER/PR positive status post lumpectomy at [facility] currently on tamoxifen 10 mg daily March 2015  Tamoxifen Toxicities: Denies side effects of tamoxifen therapy. Hypertension: On blood pressure medication. Follows with her primary care physician. 2019 total hysterectomy with bilateral salpingo-oophorectomy. Having completed 5 years of therapy, I recommended discontinuation of tamoxifen.  Surveillance:  [facility]: Benign. She also follows up with her surgeon and with radiation oncologist. Breast exam 09/10/2017: Benign  History of microcytic anemia: She follows with her primary care physician. Obesity:  Return to clinic in 1 year for follow-up with long-term survivorship

## 2018-09-05 ENCOUNTER — Ambulatory Visit: Payer: Medicaid Other | Admitting: Physical Therapy

## 2018-09-05 ENCOUNTER — Encounter: Payer: Self-pay | Admitting: Physical Therapy

## 2018-09-05 ENCOUNTER — Other Ambulatory Visit: Payer: Self-pay

## 2018-09-05 DIAGNOSIS — M6281 Muscle weakness (generalized): Secondary | ICD-10-CM | POA: Diagnosis not present

## 2018-09-05 DIAGNOSIS — G8929 Other chronic pain: Secondary | ICD-10-CM

## 2018-09-05 NOTE — Patient Instructions (Signed)
SHOULDER: External Rotation - Supine (Cane)   Hold cane with both hands. Rotate arm away from body. Keep elbow on floor and next to body. 10___ reps per set, _2__ sets per day, _7__ days per week Add towel to keep elbow at side.  Copyright  VHI. All rights reserved.  Cane Exercise: Flexion   Lie on back, holding cane above chest. Keeping arms as straight as possible, lower cane toward floor beyond head. Hold _5___ seconds. Repeat __10__ times. Do _2___ sessions per day.  http://gt2.exer.us/91   Copyright  VHI. All rights reserved.    TENS UNIT  This is helpful for muscle pain and spasm.   Search and Purchase a TENS 7000 2nd edition at www.tenspros.com or www.amazon.com  (It should be less than $30)     TENS unit instructions:   Do not shower or bathe with the unit on  Turn the unit off before removing electrodes or batteries  If the electrodes lose stickiness add a drop of water to the electrodes after they are disconnected from the unit and place on plastic sheet. If you continued to have difficulty, call the TENS unit company to purchase more electrodes.  Do not apply lotion on the skin area prior to use. Make sure the skin is clean and dry as this will help prolong the life of the electrodes.  After use, always check skin for unusual red areas, rash or other skin difficulties. If there are any skin problems, does not apply electrodes to the same area.  Never remove the electrodes from the unit by pulling the wires.  Do not use the TENS unit or electrodes other than as directed.  Do not change electrode placement without consulting your therapist or physician.  Keep 2 fingers with between each electrode.

## 2018-09-05 NOTE — Therapy (Signed)
[place], [place], [address] Phone: [phone]   Fax:  [phone]  Physical Therapy Treatment  Patient Details  Name: Sheryl Cox MRN: [id number] Date of Birth: May 11, 1970 Referring Provider (PT): Dr. Durward Fortes    Encounter Date: 09/05/2018  PT End of Session - 09/05/18 1038    Visit Number  2    Number of Visits  4    Date for PT Re-Evaluation  09/18/18    Authorization Type  MCD    Authorization - Visit Number  2    Authorization - Number of Visits  4    PT Start Time  4268    PT Stop Time  1113    PT Time Calculation (min)  38 min       Past Medical History:  Diagnosis Date  . Anemia    history of blood transfusion-no abnormal reaction noted  . Asthma    Albuterol as needed  . 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 Diabetes mellitus without complication ([place])    takes Metformin daily  . History of migraine    several yrs ago  . Hypertension    takes Tribenzor daily    Past Surgical History:  Procedure Laterality Date  . BIOPSY BREAST Left 12/05/12   fibroadenoma  . BREAST LUMPECTOMY     2014  . BREAST SURGERY Right   . CESAREAN SECTION  01/05/2009  . CHOLECYSTECTOMY N/A 01/31/2016   Procedure: LAPAROSCOPIC CHOLECYSTECTOMY;  Surgeon: Erroll Luna, MD;  Location: [facility];  Service: General;  Laterality: N/A;  . DILITATION & CURRETTAGE/HYSTROSCOPY WITH HYDROTHERMAL ABLATION N/A 08/12/2013   Procedure: DILATATION & CURETTAGE/HYSTEROSCOPY WITH HYDROTHERMAL ABLATION;  Surgeon: Frederico Hamman, MD;  Location: [facility];  Service: Gynecology;  Laterality: N/A;  . ROBOTIC ASSISTED TOTAL HYSTERECTOMY N/A 06/18/2017   Procedure: XI ROBOTIC ASSISTED TOTAL HYSTERECTOMY;  Surgeon: Lavonia Drafts, MD;  Location: [facility];  Service: Gynecology;  Laterality: N/A;  . SALPINGOOPHORECTOMY Bilateral 06/18/2017   Procedure: BILATERAL SALPINGO OOPHORECTOMY;  Surgeon:  Lavonia Drafts, MD;  Location: [facility];  Service: Gynecology;  Laterality: Bilateral;    There were no vitals filed for this visit.  Subjective Assessment - 09/05/18 1036    Subjective  Pt arrives with young daughter. [employer] blower/designer educated patient on our "no visitor policy" for the future. She reports pain still bothersome in right shoulder. Pain less today due to taking Tylenol PM.    Currently in Pain?  Yes    Pain Score  7    4/10 left shoulder   Pain Location  Shoulder    Pain Orientation  Right    Pain Descriptors / Indicators  Aching;Sharp    Pain Type  Chronic pain    Aggravating Factors   PM , laying on it    Pain Relieving Factors  meds, ice                       [facility] Adult PT Treatment/Exercise - 09/05/18 0001      Exercises   Exercises  Shoulder      Shoulder Exercises: Supine   Other Supine Exercises  supine cane      Shoulder Exercises: Standing   Other Standing Exercises  scap squeezes x 15      Shoulder Exercises: ROM/Strengthening   Other ROM/Strengthening Exercises  bilateral wall slides     Other ROM/Strengthening Exercises  table slides, flexion,  abduction, er bilateral x 10 each       Modalities   Modalities  Cryotherapy;Electrical Stimulation      Cryotherapy   Number Minutes Cryotherapy  15 Minutes    Cryotherapy Location  Shoulder    Type of Cryotherapy  Ice pack      Electrical Stimulation   Electrical Stimulation Location  Right shoulder , seated    Electrical Stimulation Action  IFC x 15 minutes    Electrical Stimulation Parameters  9ma    Electrical Stimulation Goals  Pain             PT Education - 09/05/18 1059    Education Details  TENS, HEP    Person(s) Educated  Patient    Methods  Explanation;Handout    Comprehension  Verbalized understanding       PT Short Term Goals - 08/28/18 0927      PT SHORT TERM GOAL #1   Title  pt will be I with initial HEP    Baseline  unknown, given on eval     Time  3    Period  Weeks    Status  New    Target Date  09/18/18      PT SHORT TERM GOAL #2   Title  pt will be able to raise her Rt arm to 110 deg for improved ADLs    Baseline  92 deg flexion , 65 abduction    Time  3    Period  Weeks    Status  New    Target Date  09/18/18      PT SHORT TERM GOAL #3   Title  Pt will be able to report use of ice at home for pain control    Baseline  does not have or use    Time  3    Period  Weeks    Status  New    Target Date  09/18/18      PT SHORT TERM GOAL #4   Title  Pt will report overall less pain with ADLs and normal housework <7/10    Baseline  patient was a 10/10 today    Time  6    Period  Weeks    Status  New    Target Date  09/18/18               Plan - 09/05/18 1114    Clinical Impression Statement  Pt arrives with c/o 7/10 pain in right shoulder, 4/10 in left, Reviewed HEP and progressed with AAROM. Updated HEP with supine AAROM. Tiral on Estim to right shoulder. Pain reduced from 7/10 to 0/10 right shoulder after estim. Pt given info on how to purchase TENS. She may get an order from MD to use insurance coverage.    PT Next Visit Plan  check HEP, modalities for pain, AAROM as tolerate, repeat IFC, did she ask MD for TENS referral?    PT Home Exercise Plan  table slides ER, flex, scaption, scap squeeze, supine cane       Patient will benefit from skilled therapeutic intervention in order to improve the following deficits and impairments:  Increased fascial restricitons, Pain, Postural dysfunction, Decreased mobility, Decreased range of motion, Decreased strength, Obesity  Visit Diagnosis: 1. Muscle weakness (generalized)   2. Chronic right shoulder pain        Problem List Patient Active Problem List   Diagnosis Date Noted  . Chronic pain of both knees 05/21/2018  .  Chronic right shoulder pain 05/21/2018  . BMI 40.0-44.9, adult ([place]) 05/21/2018  . Anemia 06/05/2013  . DCIS (ductal carcinoma in situ)  11/26/2012  . Breast cancer of upper-outer quadrant of right female breast ([place]) 11/24/2012  . Nabothian cyst 12/07/2011  . Cervical mass 11/20/2011  . Fibroid uterus 11/20/2011    Dorene Ar, PTA 09/05/2018, 11:18 AM  [facility] [address] [place], [place], [address] Phone: [phone]   Fax:  [phone]  Name: RILEI KRAVITZ MRN: [id number] Date of Birth: 1970/11/26

## 2018-09-08 ENCOUNTER — Other Ambulatory Visit: Payer: Self-pay

## 2018-09-08 ENCOUNTER — Encounter: Payer: Self-pay | Admitting: Physical Therapy

## 2018-09-08 ENCOUNTER — Ambulatory Visit: Payer: Medicaid Other | Admitting: Physical Therapy

## 2018-09-08 DIAGNOSIS — M6281 Muscle weakness (generalized): Secondary | ICD-10-CM

## 2018-09-08 DIAGNOSIS — G8929 Other chronic pain: Secondary | ICD-10-CM

## 2018-09-08 NOTE — Therapy (Signed)
[place], [place], [address] Phone: [phone]   Fax:  [phone]  Physical Therapy Treatment  Patient Details  Name: Sheryl Cox MRN: [id number] Date of Birth: 1970-11-21 Referring Provider (PT): Dr. Durward Fortes    Encounter Date: 09/08/2018  PT End of Session - 09/08/18 1300    Visit Number  3    Number of Visits  4    Date for PT Re-Evaluation  09/18/18    Authorization Type  MCD    Authorization - Visit Number  3    Authorization - Number of Visits  4    PT Start Time  2725    PT Stop Time  1341    PT Time Calculation (min)  46 min       Past Medical History:  Diagnosis Date  . Anemia    history of blood transfusion-no abnormal reaction noted  . Asthma    Albuterol as needed  . 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 Diabetes mellitus without complication ([place])    takes Metformin daily  . History of migraine    several yrs ago  . Hypertension    takes Tribenzor daily    Past Surgical History:  Procedure Laterality Date  . BIOPSY BREAST Left 12/05/12   fibroadenoma  . BREAST LUMPECTOMY     2014  . BREAST SURGERY Right   . CESAREAN SECTION  01/05/2009  . CHOLECYSTECTOMY N/A 01/31/2016   Procedure: LAPAROSCOPIC CHOLECYSTECTOMY;  Surgeon: Erroll Luna, MD;  Location: [facility];  Service: General;  Laterality: N/A;  . DILITATION & CURRETTAGE/HYSTROSCOPY WITH HYDROTHERMAL ABLATION N/A 08/12/2013   Procedure: DILATATION & CURETTAGE/HYSTEROSCOPY WITH HYDROTHERMAL ABLATION;  Surgeon: Frederico Hamman, MD;  Location: [facility];  Service: Gynecology;  Laterality: N/A;  . ROBOTIC ASSISTED TOTAL HYSTERECTOMY N/A 06/18/2017   Procedure: XI ROBOTIC ASSISTED TOTAL HYSTERECTOMY;  Surgeon: Lavonia Drafts, MD;  [facility];  Service: Gynecology;  Laterality: N/A;  . SALPINGOOPHORECTOMY Bilateral 06/18/2017   Procedure: BILATERAL SALPINGO OOPHORECTOMY;  Surgeon:  Lavonia Drafts, MD;  [facility];  Service: Gynecology;  Laterality: Bilateral;    There were no vitals filed for this visit.  Subjective Assessment - 09/08/18 1257    Subjective  Right shoulder 7-8/10 , left shoulder no pain right now.    Currently in Pain?  Yes    Pain Score  7     Pain Location  Shoulder    Pain Orientation  Right    Pain Descriptors / Indicators  Sharp    Pain Type  Chronic pain    Aggravating Factors   Pm, laying on it    Pain Relieving Factors  TENS, Meds Ice         [facility] PT Assessment - 09/08/18 0001      AROM   Right Shoulder Flexion  110 Degrees    Right Shoulder ABduction  95 Degrees    Right Shoulder Internal Rotation  --   reach to Rt buttock    Right Shoulder External Rotation  --   reach to right upper trap                   [facility] Adult PT Treatment/Exercise - 09/08/18 0001      Shoulder Exercises: Supine   Other Supine Exercises  supine cane      Shoulder Exercises: Standing   Extension  15 reps    Theraband Level (Shoulder Extension)  Level 1 (Yellow)    Row  15 reps    Theraband Level (Shoulder Row)  Level 1 (Yellow)      Shoulder Exercises: Pulleys   Flexion  2 minutes    Scaption  1 minute      Shoulder Exercises: ROM/Strengthening   UBE (Upper Arm Bike)  Level 1 1.5 min forward, 1.5 min back       Cryotherapy   Number Minutes Cryotherapy  15 Minutes    Cryotherapy Location  Shoulder    Type of Cryotherapy  Ice pack      Electrical Stimulation   Electrical Stimulation Location  Right shoulder , seated    Electrical Stimulation Action  IFC x 15 min    Electrical Stimulation Parameters  15 ma     Electrical Stimulation Goals  Pain      Manual Therapy   Manual therapy comments  passive ROM bilateral all planes to tolerance               PT Short Term Goals - 08/28/18 0927      PT SHORT TERM GOAL #1   Title  pt will be I with initial HEP    Baseline  unknown, given on eval    Time   3    Period  Weeks    Status  New    Target Date  09/18/18      PT SHORT TERM GOAL #2   Title  pt will be able to raise her Rt arm to 110 deg for improved ADLs    Baseline  92 deg flexion , 65 abduction    Time  3    Period  Weeks    Status  New    Target Date  09/18/18      PT SHORT TERM GOAL #3   Title  Pt will be able to report use of ice at home for pain control    Baseline  does not have or use    Time  3    Period  Weeks    Status  New    Target Date  09/18/18      PT SHORT TERM GOAL #4   Title  Pt will report overall less pain with ADLs and normal housework <7/10    Baseline  patient was a 10/10 today    Time  6    Period  Weeks    Status  New    Target Date  09/18/18               Plan - 09/08/18 1335    Clinical Impression Statement  Pt reports 0/10 left shoulder pain however continued 7-8/10 pain in right shoulder. She reports IFC helpful last viist for a period of time. Reviewed HEP HEP and began UBE/scapular bands. Repeated IFC. Increased pain with end range PROM on right shoulder.    PT Next Visit Plan  assess/ request MCD extension; begin [place]wood for left shoulder, consider sleeper stretch for right; check HEP, modalities for pain, AAROM as tolerate, repeat IFC, did she ask MD for TENS referral?    PT Home Exercise Plan  table slides ER, flex, scaption, scap squeeze, supine cane, standing rows and extension       Patient will benefit from skilled therapeutic intervention in order to improve the following deficits and impairments:  Increased fascial restricitons, Pain, Postural dysfunction, Decreased mobility, Decreased range of motion, Decreased strength, Obesity  Visit Diagnosis: 1. Muscle weakness (generalized)  2. Chronic right shoulder pain        Problem List Patient Active Problem List   Diagnosis Date Noted  . Chronic pain of both knees 05/21/2018  . Chronic right shoulder pain 05/21/2018  . BMI 40.0-44.9, adult ([place]) 05/21/2018  .  Anemia 06/05/2013  . 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 Breast cancer of upper-outer quadrant of right female breast ([place]) 11/24/2012  . Nabothian cyst 12/07/2011  . Cervical mass 11/20/2011  . Fibroid uterus 11/20/2011    Dorene Ar, PTA 09/08/2018, 1:54 PM  [facility] [address] [place], [place], [address] Phone: [phone]   Fax:  [phone]  Name: Sheryl Cox MRN: [id number] Date of Birth: [birth year]

## 2018-09-10 ENCOUNTER — Ambulatory Visit: Payer: Medicaid Other | Admitting: Physical Therapy

## 2018-09-10 ENCOUNTER — Other Ambulatory Visit: Payer: Self-pay

## 2018-09-10 DIAGNOSIS — M6281 Muscle weakness (generalized): Secondary | ICD-10-CM | POA: Diagnosis not present

## 2018-09-10 DIAGNOSIS — G8929 Other chronic pain: Secondary | ICD-10-CM

## 2018-09-10 NOTE — Therapy (Signed)
[place], [place], [address] Phone: [phone]   Fax:  [phone]  Physical Therapy Treatment/Renewal   Patient Details  Name: Sheryl Cox MRN: [id number] Date of Birth: Aug 18, 1970 Referring Provider (PT): Dr. Durward Fortes    Encounter Date: 09/10/2018  PT End of Session - 09/10/18 1231    Visit Number  4    Number of Visits  16    Date for PT Re-Evaluation  10/22/18    Authorization Type  MCD    Authorization Time Period  asking for 2 x 6 more 6/24    Authorization - Visit Number  4    Authorization - Number of Visits  4    PT Start Time  1230    PT Stop Time  1330    PT Time Calculation (min)  60 min    Activity Tolerance  Patient tolerated treatment well    Behavior During Therapy  [facility] for tasks assessed/performed       Past Medical History:  Diagnosis Date  . Anemia    history of blood transfusion-no abnormal reaction noted  . Asthma    Albuterol as needed  . 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 Diabetes mellitus without complication ([place])    takes Metformin daily  . History of migraine    several yrs ago  . Hypertension    takes Tribenzor daily    Past Surgical History:  Procedure Laterality Date  . BIOPSY BREAST Left 12/05/12   fibroadenoma  . BREAST LUMPECTOMY     2014  . BREAST SURGERY Right   . CESAREAN SECTION  01/05/2009  . CHOLECYSTECTOMY N/A 01/31/2016   Procedure: LAPAROSCOPIC CHOLECYSTECTOMY;  Surgeon: Erroll Luna, MD;  Location: [facility];  Service: General;  Laterality: N/A;  . DILITATION & CURRETTAGE/HYSTROSCOPY WITH HYDROTHERMAL ABLATION N/A 08/12/2013   Procedure: DILATATION & CURETTAGE/HYSTEROSCOPY WITH HYDROTHERMAL ABLATION;  Surgeon: Frederico Hamman, MD;  Location: [facility];  Service: Gynecology;  Laterality: N/A;  . ROBOTIC ASSISTED TOTAL HYSTERECTOMY N/A 06/18/2017   Procedure: XI ROBOTIC ASSISTED TOTAL HYSTERECTOMY;  Surgeon:  Lavonia Drafts, MD;  Location[facility];  Service: Gynecology;  Laterality: N/A;  . SALPINGOOPHORECTOMY Bilateral 06/18/2017   Procedure: BILATERAL SALPINGO OOPHORECTOMY;  Surgeon: Lavonia Drafts, MD;  [facility];  Service: Gynecology;  Laterality: Bilateral;    There were no vitals filed for this visit.  Subjective Assessment - 09/10/18 1235    Subjective  It does not hurt right now.  Last night it was a 9/10.  I feel like I can lift up higher .  Uses mostly the L hand to do my work.    Currently in Pain?  Yes    Pain Score  8    with UBE   Pain Location  Shoulder    Pain Orientation  Right    Pain Descriptors / Indicators  Aching    Pain Type  Chronic pain    Pain Radiating Towards  mid arm    Pain Onset  More than a month ago    Pain Frequency  Intermittent    Aggravating Factors   PM , laying on it    Pain Relieving Factors  ibuprofen, ice         [facility] PT Assessment - 09/10/18 0001      Assessment   Medical Diagnosis  R shoulder pain     Referring Provider (PT)  Dr. 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     Leisure  playing with her child, being outdoor       Cognition   Overall Cognitive Status  Within Functional Limits for tasks assessed      AROM   Right Shoulder Flexion  112 Degrees    Right Shoulder ABduction  90 Degrees    Right Shoulder Internal Rotation  --   reach to Rt buttock    Right Shoulder External Rotation  35 Degrees   reach to right upper trap      Strength   Right  Shoulder Flexion  3+/5    Right Shoulder ABduction  3+/5    Right Shoulder Internal Rotation  4-/5    Right Shoulder External Rotation  3+/5    Left Shoulder Flexion  4/5    Left Shoulder ABduction  4/5        [facility] Adult PT Treatment/Exercise - 09/10/18 0001      Shoulder Exercises: Supine   Horizontal ABduction  Strengthening;Both;12 reps    Theraband Level (Shoulder Horizontal ABduction)  Level 2 (Red)    External Rotation  Strengthening;Both;20 reps    Theraband Level (Shoulder External Rotation)  Level 2 (Red)      Shoulder Exercises: Standing   Extension  15 reps    Theraband Level (Shoulder Extension)  Level 2 (Red)    Row  15 reps    Theraband Level (Shoulder Row)  Level 2 (Red)      Shoulder Exercises: ROM/Strengthening   UBE (Upper Arm Bike)  Level 1 , 3 min FW and 3 min back       Cryotherapy   Number Minutes Cryotherapy  15 Minutes    Cryotherapy Location  Shoulder    Type of Cryotherapy  Ice pack      Electrical Stimulation   Electrical Stimulation Location  Right shoulder , seated    Electrical Stimulation Action  IFC x 15     Electrical Stimulation Parameters  18 ma     Electrical Stimulation Goals  Pain      Manual Therapy   Manual Therapy  Soft tissue mobilization    Manual therapy comments  passive ROM Rt UE all planes to tolerance    Soft tissue mobilization  Rt deltoid, ant/lateral R shoulder              PT Education - 09/10/18 1314    Education Details  TENS, medical supply centers    Person(s) Educated  Patient    Methods  Explanation    Comprehension  Verbalized understanding       PT Short Term Goals - 09/10/18 1254      PT SHORT TERM GOAL #1   Title  pt will be I with initial HEP    Baseline  min cues needed  Time  6    Period  Weeks    Status  On-going    Target Date  10/22/18      PT SHORT TERM GOAL #2   Title  pt will be able to raise her Rt arm to 110 deg for improved ADLs    Status  Achieved      PT SHORT TERM  GOAL #3   Title  Pt will be able to report use of ice at home for pain control    Status  Achieved      PT SHORT TERM GOAL #4   Title  Pt will report overall less pain with ADLs and normal housework <7/10    Status  Achieved        PT Long Term Goals - 09/10/18 1255      PT LONG TERM GOAL #1   Title  Pt will be able to use her Rt UE to aid in lifting kitchen items to shoulder height    Baseline  cannot use even to assist with this activity    Time  6    Period  Weeks    Status  New    Target Date  10/22/18      PT LONG TERM GOAL #2   Title  Pt will have min to no difficulty donning and doffing shirt    Baseline  very difficult, painful    Time  6    Period  Weeks    Target Date  10/22/18      PT LONG TERM GOAL #3   Title  Pt will be able to sleep with less diffculty, sleeping more consecutive hours due to pain relief.    Baseline  wakes frequently, tosses and turns    Time  6    Period  Weeks    Status  New    Target Date  10/22/18      PT LONG TERM GOAL #4   Title  Pt will be able to show increased strength in Rt UE to 4+/5 in shoulder flexion and abduction for maximal strength with lift, carrying    Baseline  3+/5    Time  6    Period  Weeks    Status  New    Target Date  10/22/18      PT LONG TERM GOAL #5   Title  Pt will be able to reach with Rt arm into back seat with min discomfort.    Baseline  pain is severe with this    Time  6    Period  Weeks    Status  New    Target Date  10/22/18            Plan - 09/10/18 1316    Clinical Impression Statement  Pain continues to interfere with ADLs and housework.  She has been able to increase her ROM a bit.  Strength still lacking.  Will need increased time to achieve goals due to lenght of time of injury.    Personal Factors and Comorbidities  Comorbidity 1;Comorbidity 2;Time since onset of injury/illness/exacerbation    Examination-Activity Limitations  Lift;Reach Overhead;Transfers;Sleep     Examination-Participation Restrictions  Community Activity    PT Frequency  2x / week    PT Duration  6 weeks    PT Treatment/Interventions  ADLs/Self Care Home Management;Electrical Stimulation;Patient/family education;Taping;Therapeutic exercise;Iontophoresis 4mg /ml Dexamethasone;Moist Heat;Cryotherapy;Ultrasound;Functional mobility training;Manual techniques;Passive range of motion;Dry needling    PT Next Visit Plan  consider  sleeper stretch for right; check HEP, modalities for pain, AAROM as tolerate, repeat IFC, TENS referral    PT Home Exercise Plan  table slides ER, flex, scaption, scap squeeze, supine cane, standing rows and extension    Consulted and Agree with Plan of Care  Patient       Patient will benefit from skilled therapeutic intervention in order to improve the following deficits and impairments:  Increased fascial restricitons, Pain, Postural dysfunction, Decreased mobility, Decreased range of motion, Decreased strength, Obesity  Visit Diagnosis: 1. Muscle weakness (generalized)   2. Chronic right shoulder pain        Problem List Patient Active Problem List   Diagnosis Date Noted  . Chronic pain of both knees 05/21/2018  . Chronic right shoulder pain 05/21/2018  . BMI 40.0-44.9, adult ([place]) 05/21/2018  . Anemia 06/05/2013  . 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 Breast cancer of upper-outer quadrant of right female breast ([place]) 11/24/2012  . Nabothian cyst 12/07/2011  . Cervical mass 11/20/2011  . Fibroid uterus 11/20/2011    Algie Cales 09/10/2018, 1:20 PM  [facility] [address] [place], [place], [address] Phone: [phone]   Fax:  [phone]  Name: MAEBELL LYVERS MRN: [id number] Date of Birth: 09-Mar-1971  Raeford Razor, PT 09/10/18 1:21 PM Phone: [phone] Fax: [phone]

## 2018-09-10 NOTE — Progress Notes (Signed)
HEMATOLOGY-ONCOLOGY DOXIMITY VISIT PROGRESS NOTE  I connected with [name] on 09/11/2018 at  8:30 AM EDT by Doximity video conference and verified that I am speaking with the correct person using two identifiers.  I discussed the limitations, risks, security and privacy concerns of performing an evaluation and management service by Doximity and the availability of in person appointments.  I also discussed with the patient that there may be a patient responsible charge related to this service. The patient expressed understanding and agreed to proceed.  Patient's Location: Home Physician Location: Clinic  CHIEF COMPLIANT: Follow-up of tamoxifen  INTERVAL HISTORY: Sheryl Cox is a 48 y.o. 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 Mammogram on 03/06/18 showed no evidence of malignancy bilaterally. I last saw her a year ago. She presents over Doximity for annual follow-up.   Oncology History  Breast cancer of upper-outer quadrant of right female breast ([place])  11/06/2012 Mammogram    Numerous calcifications appear both linear in morphology and linear in distribution and extend over a large portion of the breast.  The longest area is 6.0 x 1.2 cm.  A second area is 6.0 x 1.4 cm.  Also closer to the nipple, two discrete calcifications   11/21/2012 Initial Diagnosis   Breast cancer of upper-outer quadrant of right female breast; ER 100% PR 100% DCIS with calcifications based on biopsy of the right breast.   11/26/2012 Breast MRI   upper inner quadrant of the posterior left breast, there is an 8 x 7 x 7 mm circumscribed enhancing mass;    03/25/2013 - 05/02/2013 Radiation Therapy   50 Gy to the right breast    05/19/2013 -  Anti-estrogen oral therapy   Tamoxifen 20 mg daily   02/23/2014 Surgery   Right Lumpectomy: At [facility]; DCIS, ER/PR Positive   06/26/2017 Surgery   Hysterectomy with BSO     REVIEW  OF SYSTEMS:   Constitutional: Denies fevers, chills or abnormal weight loss Eyes: Denies blurriness of vision Ears, nose, mouth, throat, and face: Denies mucositis or sore throat Respiratory: Denies cough, dyspnea or wheezes Cardiovascular: Denies palpitation, chest discomfort Gastrointestinal:  Denies nausea, heartburn or change in bowel habits Skin: Denies abnormal skin rashes Lymphatics: Denies new lymphadenopathy or easy bruising Neurological:Denies numbness, tingling or new weaknesses Behavioral/Psych: Mood is stable, no new changes  Extremities: No lower extremity edema Breast: denies any pain or lumps or nodules in either breasts All other systems were reviewed with the patient and are negative.  Observations/Objective:  There were no vitals filed for this visit. There is no height or weight on file to calculate BMI.  I have reviewed the data as listed CMP Latest Ref Rng & Units 08/13/2018 01/28/2018 06/19/2017  Glucose 70 - 99 mg/dL 118(H) 117(H) 126(H)  BUN 6 - 20 mg/dL 15 15 11   Creatinine 0.44 - 1.00 mg/dL 0.78 0.73 0.75  Sodium 135 - 145 mmol/L 136 144 136  Potassium 3.5 - 5.1 mmol/L 3.4(L) 3.8 4.3  Chloride 98 - 111 mmol/L 99 105 102  CO2 22 - 32 mmol/L 26 23 25   Calcium 8.9 - 10.3 mg/dL 9.9 9.2 8.6(L)  Total Protein 6.0 - 8.5 g/dL - 6.7 -  Total Bilirubin 0.0 - 1.2 mg/dL - <0.2 -  Alkaline Phos 39 - 117 IU/L - 41 -  AST 0 - 40 IU/L - 19 -  ALT 0 - 32 IU/L - 15 -  Lab Results  Component Value Date   WBC 8.1 08/13/2018   HGB 11.9 (L) 08/13/2018   HCT 37.9 08/13/2018   MCV 77.8 (L) 08/13/2018   PLT 378 08/13/2018   NEUTROABS 2.4 01/28/2018      Assessment Plan:  Breast cancer of upper-outer quadrant of right female breast ([place]) Right breast DCIS ER/PR positive status post lumpectomy at [facility] currently on tamoxifen 10 mg daily March 2015  Tamoxifen Toxicities: Denies side effects of tamoxifen therapy. Hypertension: On blood pressure medication.  Follows with her primary care physician. 2019 total hysterectomy with bilateral salpingo-oophorectomy. Having completed 5 years of therapy, I recommended discontinuation of tamoxifen. Patient wants to stay on antiestrogen therapy even after 5 years is complete.  I discussed with her about the risks of tamoxifen 20 risk of blood clots and stroke.  Surveillance:  [facility]: Benign. She also follows up with her surgeon and with radiation oncologist. Breast exam 09/10/2017: Benign  History of microcytic anemia: She follows with her primary care physician. Obesity: Not lost weight. Walking and trying to lose weight.  Return to clinic in 1 year for follow-up    I discussed the assessment and treatment plan with the patient. The patient was provided an opportunity to ask questions and all were answered. The patient agreed with the plan and demonstrated an understanding of the instructions. The patient was advised to call back or seek an in-person evaluation if the symptoms worsen or if the condition fails to improve as anticipated.   I provided 12 minutes of face-to-face Doximity time during this encounter.    Rulon Eisenmenger, MD 09/11/2018   I, Molly Dorshimer, am acting as scribe for Nicholas Lose, MD.  I have reviewed the above documentation for accuracy and completeness, and I agree with the above.

## 2018-09-11 ENCOUNTER — Inpatient Hospital Stay: Payer: Medicaid Other | Attending: Hematology and Oncology | Admitting: Hematology and Oncology

## 2018-09-11 ENCOUNTER — Telehealth: Payer: Self-pay | Admitting: Hematology and Oncology

## 2018-09-11 DIAGNOSIS — Z9079 Acquired absence of other genital organ(s): Secondary | ICD-10-CM

## 2018-09-11 DIAGNOSIS — Z79810 Long term (current) use of selective estrogen receptor modulators (SERMs): Secondary | ICD-10-CM

## 2018-09-11 DIAGNOSIS — Z170 Estrogen receptor positive status [ER+]: Secondary | ICD-10-CM

## 2018-09-11 DIAGNOSIS — Z90710 Acquired absence of both cervix and uterus: Secondary | ICD-10-CM

## 2018-09-11 DIAGNOSIS — C50411 Malignant neoplasm of upper-outer quadrant of right female breast: Secondary | ICD-10-CM | POA: Diagnosis not present

## 2018-09-11 DIAGNOSIS — Z90722 Acquired absence of ovaries, bilateral: Secondary | ICD-10-CM

## 2018-09-11 DIAGNOSIS — Z923 Personal history of irradiation: Secondary | ICD-10-CM

## 2018-09-11 MED ORDER — TAMOXIFEN CITRATE 20 MG PO TABS: 20.0000 mg | ORAL_TABLET | Freq: Every day | ORAL | 3 refills | Status: DC

## 2018-09-11 NOTE — Telephone Encounter (Signed)
I talk with patient regarding schedule  

## 2018-09-17 ENCOUNTER — Ambulatory Visit: Payer: Medicaid Other | Attending: Orthopaedic Surgery | Admitting: Physical Therapy

## 2018-09-17 ENCOUNTER — Encounter: Payer: Self-pay | Admitting: Physical Therapy

## 2018-09-17 ENCOUNTER — Other Ambulatory Visit: Payer: Self-pay

## 2018-09-17 DIAGNOSIS — M6281 Muscle weakness (generalized): Secondary | ICD-10-CM | POA: Diagnosis not present

## 2018-09-17 DIAGNOSIS — G8929 Other chronic pain: Secondary | ICD-10-CM

## 2018-09-17 DIAGNOSIS — M25511 Pain in right shoulder: Secondary | ICD-10-CM | POA: Diagnosis present

## 2018-09-17 NOTE — Therapy (Signed)
[place], [place], [address] Phone: [phone]   Fax:  [phone]  Physical Therapy Treatment  Patient Details  Name: Sheryl Cox MRN: [id number] Date of Birth: [birth year] Referring Provider (PT): Dr. Durward Fortes    Encounter Date: 09/17/2018  PT End of Session - 09/17/18 1035    Visit Number  5    Number of Visits  16    Date for PT Re-Evaluation  10/22/18    Authorization Type  MCD, approved 12 additional treatments    Authorization Time Period  09/16/18-10/27/18    Authorization - Visit Number  1    Authorization - Number of Visits  12    PT Start Time  1030    PT Stop Time  1115    PT Time Calculation (min)  45 min       Past Medical History:  Diagnosis Date  . Anemia    history of blood transfusion-no abnormal reaction noted  . Asthma    Albuterol as needed  . Breast cancer ([place]) 11/2012   DCIS-ER+, PR+.Takes Tamoxifen daily  . Diabetes mellitus without complication ([place])    takes Metformin daily  . History of migraine    several yrs ago  . Hypertension    takes Tribenzor daily    Past Surgical History:  Procedure Laterality Date  . BIOPSY BREAST Left 12/05/12   fibroadenoma  . BREAST LUMPECTOMY     2014  . BREAST SURGERY Right   . CESAREAN SECTION  01/05/2009  . CHOLECYSTECTOMY N/A 01/31/2016   Procedure: LAPAROSCOPIC CHOLECYSTECTOMY;  Surgeon: Erroll Luna, MD;  Location: [facility];  Service: General;  Laterality: N/A;  . DILITATION & CURRETTAGE/HYSTROSCOPY WITH HYDROTHERMAL ABLATION N/A 08/12/2013   Procedure: DILATATION & CURETTAGE/HYSTEROSCOPY WITH HYDROTHERMAL ABLATION;  Surgeon: Frederico Hamman, MD;  Location: [facility];  Service: Gynecology;  Laterality: N/A;  . ROBOTIC ASSISTED TOTAL HYSTERECTOMY N/A 06/18/2017   Procedure: XI ROBOTIC ASSISTED TOTAL HYSTERECTOMY;  Surgeon: Lavonia Drafts, MD;  Location: [facility];  Service: Gynecology;  Laterality: N/A;  .  SALPINGOOPHORECTOMY Bilateral 06/18/2017   Procedure: BILATERAL SALPINGO OOPHORECTOMY;  Surgeon: Lavonia Drafts, MD;  Location: [facility];  Service: Gynecology;  Laterality: Bilateral;    There were no vitals filed for this visit.  Subjective Assessment - 09/17/18 1034    Subjective  8/10 pain in right shoulder. Left is good except when i need to push up from bed and chairs.    Currently in Pain?  Yes    Pain Score  8     Pain Location  Shoulder    Pain Orientation  Right    Pain Descriptors / Indicators  Aching    Aggravating Factors   air conditioning, sleep    Pain Relieving Factors  ibuprofen , ice         [facility] PT Assessment - 09/17/18 0001      AROM   Right Shoulder Flexion  121 Degrees    Right Shoulder ABduction  85 Degrees    Right Shoulder Internal Rotation  --   reach to Rt buttock    Right Shoulder External Rotation  --   reach to C5                  [facility] Adult PT Treatment/Exercise - 09/17/18 0001      Shoulder Exercises: Supine   Other Supine Exercises  supine cane      Shoulder Exercises: Sidelying   ABduction  AROM;15 reps      Shoulder Exercises: Standing   Extension  15 reps    Theraband Level (Shoulder Extension)  Level 2 (Red)    Row  15 reps    Theraband Level (Shoulder Row)  Level 2 (Red)    Other Standing Exercises  wall ladder flexion and scaption. UE ranger used as standing cane for scaption     Other Standing Exercises  wall slides bilateral       Shoulder Exercises: Pulleys   Flexion  2 minutes      Shoulder Exercises: ROM/Strengthening   UBE (Upper Arm Bike)  Level 1 , 3 min FW and 3 min back       Shoulder Exercises: Stretch   Other Shoulder Stretches  rt side lying sleeper stretch       Modalities   Modalities  Iontophoresis;Ultrasound      Ultrasound   Ultrasound Location  Right shoulder superior to anterior/ lateral     Ultrasound Parameters  50% 1.0 w/cm2 18mhz    Ultrasound Goals  Pain;Edema       Iontophoresis   Type of Iontophoresis  Dexamethasone    Location  Right anterior lateral shoulder    Dose  90ml     Time  6 hour slow release patch               PT Short Term Goals - 09/10/18 1254      PT SHORT TERM GOAL #1   Title  pt will be I with initial HEP    Baseline  min cues needed    Time  6    Period  Weeks    Status  On-going    Target Date  10/22/18      PT SHORT TERM GOAL #2   Title  pt will be able to raise her Rt arm to 110 deg for improved ADLs    Status  Achieved      PT SHORT TERM GOAL #3   Title  Pt will be able to report use of ice at home for pain control    Status  Achieved      PT SHORT TERM GOAL #4   Title  Pt will report overall less pain with ADLs and normal housework <7/10    Status  Achieved        PT Long Term Goals - 09/10/18 1255      PT LONG TERM GOAL #1   Title  Pt will be able to use her Rt UE to aid in lifting kitchen items to shoulder height    Baseline  cannot use even to assist with this activity    Time  6    Period  Weeks    Status  New    Target Date  10/22/18      PT LONG TERM GOAL #2   Title  Pt will have min to no difficulty donning and doffing shirt    Baseline  very difficult, painful    Time  6    Period  Weeks    Target Date  10/22/18      PT LONG TERM GOAL #3   Title  Pt will be able to sleep with less diffculty, sleeping more consecutive hours due to pain relief.    Baseline  wakes frequently, tosses and turns    Time  6    Period  Weeks    Status  New    Target Date  10/22/18      PT LONG TERM GOAL #4   Title  Pt will be able to show increased strength in Rt UE to 4+/5 in shoulder flexion and abduction for maximal strength with lift, carrying    Baseline  3+/5    Time  6    Period  Weeks    Status  New    Target Date  10/22/18      PT LONG TERM GOAL #5   Title  Pt will be able to reach with Rt arm into back seat with min discomfort.    Baseline  pain is severe with this    Time  6     Period  Weeks    Status  New    Target Date  10/22/18            Plan - 09/17/18 1133    Clinical Impression Statement  Improved flexion AROM today. Abduction and IR still limited. Began sleeper stretch and side abduction AROM which she tolerated well. Trial of [place] and Iontophoresis today for pain and inflammation in right shoulder. After [place] she reported pain decreased.    PT Next Visit Plan  assess response to US/Ionto, repeat sleeper stretch for right and side AROM for abduction, try scap mobs ; progress HEP,    PT Home Exercise Plan  table slides ER, flex, scaption, scap squeeze, supine cane, standing rows and extension       Patient will benefit from skilled therapeutic intervention in order to improve the following deficits and impairments:  Increased fascial restricitons, Pain, Postural dysfunction, Decreased mobility, Decreased range of motion, Decreased strength, Obesity  Visit Diagnosis: 1. Muscle weakness (generalized)   2. Chronic right shoulder pain        Problem List Patient Active Problem List   Diagnosis Date Noted  . Chronic pain of both knees 05/21/2018  . Chronic right shoulder pain 05/21/2018  . BMI 40.0-44.9, adult ([place]) 05/21/2018  . Anemia 06/05/2013  . 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 Breast cancer of upper-outer quadrant of right female breast ([place]) 11/24/2012  . Nabothian cyst 12/07/2011  . Cervical mass 11/20/2011  . Fibroid uterus 11/20/2011    Dorene Ar, PTA 09/17/2018, 11:37 AM  [facility] [address] [place], [place], [address] Phone: [phone]   Fax:  [phone]  Name: Sheryl Cox MRN: [id number] Date of Birth: 1970/05/31

## 2018-09-24 ENCOUNTER — Other Ambulatory Visit: Payer: Self-pay

## 2018-09-24 ENCOUNTER — Telehealth: Payer: Self-pay | Admitting: Orthopaedic Surgery

## 2018-09-24 ENCOUNTER — Ambulatory Visit: Payer: Medicaid Other | Admitting: Physical Therapy

## 2018-09-24 ENCOUNTER — Encounter: Payer: Self-pay | Admitting: Physical Therapy

## 2018-09-24 DIAGNOSIS — M6281 Muscle weakness (generalized): Secondary | ICD-10-CM

## 2018-09-24 DIAGNOSIS — G8929 Other chronic pain: Secondary | ICD-10-CM

## 2018-09-24 NOTE — Telephone Encounter (Signed)
Patient went for Tens Unit to [facility]. Patient was told she would have to pay $100. up front, and file her own [employer]. Patient cannot do this. She does not have money to do this. Please call to advise.

## 2018-09-24 NOTE — Therapy (Signed)
[place], [place], [address] Phone: [phone]   Fax:  [phone]  Physical Therapy Treatment  Patient Details  Name: Sheryl Cox MRN: [id number] Date of Birth: 01-15-1971 Referring Provider (PT): Dr. Durward Fortes    Encounter Date: 09/24/2018  PT End of Session - 09/24/18 0811    Visit Number  6    Number of Visits  16    Date for PT Re-Evaluation  10/22/18    Authorization Type  MCD, approved 12 additional treatments    Authorization Time Period  09/16/18-10/27/18    Authorization - Visit Number  2    Authorization - Number of Visits  12    PT Start Time  0807    PT Stop Time  0840    PT Time Calculation (min)  33 min    Activity Tolerance  Patient tolerated treatment well    Behavior During Therapy  [facility] for tasks assessed/performed       Past Medical History:  Diagnosis Date  . Anemia    history of blood transfusion-no abnormal reaction noted  . Asthma    Albuterol as needed  . Breast cancer ([place]) 11/2012   DCIS-ER+, PR+.Takes Tamoxifen daily  . Diabetes mellitus without complication ([place])    takes Metformin daily  . History of migraine    several yrs ago  . Hypertension    takes Tribenzor daily    Past Surgical History:  Procedure Laterality Date  . BIOPSY BREAST Left 12/05/12   fibroadenoma  . BREAST LUMPECTOMY     2014  . BREAST SURGERY Right   . CESAREAN SECTION  01/05/2009  . CHOLECYSTECTOMY N/A 01/31/2016   Procedure: LAPAROSCOPIC CHOLECYSTECTOMY;  Surgeon: Erroll Luna, MD;  Location: [facility];  Service: General;  Laterality: N/A;  . DILITATION & CURRETTAGE/HYSTROSCOPY WITH HYDROTHERMAL ABLATION N/A 08/12/2013   Procedure: DILATATION & CURETTAGE/HYSTEROSCOPY WITH HYDROTHERMAL ABLATION;  Surgeon: Frederico Hamman, MD;  Location: [facility];  Service: Gynecology;  Laterality: N/A;  . ROBOTIC ASSISTED TOTAL HYSTERECTOMY N/A 06/18/2017   Procedure: XI ROBOTIC ASSISTED TOTAL  HYSTERECTOMY;  Surgeon: Lavonia Drafts, MD;  [facility];  Service: Gynecology;  Laterality: N/A;  . SALPINGOOPHORECTOMY Bilateral 06/18/2017   Procedure: BILATERAL SALPINGO OOPHORECTOMY;  Surgeon: Lavonia Drafts, MD;  Location: [facility];  Service: Gynecology;  Laterality: Bilateral;    There were no vitals filed for this visit.  Subjective Assessment - 09/24/18 0807    Subjective  Ive been aching.  8/10 been tossing and turning all night. Cant reach behind my back. Reaching is getting a bit better    Currently in Pain?  Yes    Pain Score  8     Pain Location  Shoulder    Pain Orientation  Right    Pain Descriptors / Indicators  Aching    Pain Type  Chronic pain    Pain Onset  More than a month ago    Pain Frequency  Intermittent    Aggravating Factors   cold air , sleeping    Pain Relieving Factors  ibuprofen    Multiple Pain Sites  No          [facility] Adult PT Treatment/Exercise - 09/24/18 0001      Shoulder Exercises: Standing   Extension  15 reps    Theraband Level (Shoulder Extension)  Level 2 (Red)    Row  15 reps    Theraband Level (Shoulder Row)  Level 3 (Green)  Shoulder Exercises: Pulleys   Flexion  2 minutes    Scaption  2 minutes      Shoulder Exercises: ROM/Strengthening   Wall Wash  flexion bilaterally x 10 with towel     Other ROM/Strengthening Exercises  towel stretches for extension and IR x 10 to tolerance       Shoulder Exercises: Stretch   External Rotation Stretch  5 reps;30 seconds      Ultrasound   Ultrasound Location  R shoulder     Ultrasound Goals  Pain;Edema      Iontophoresis   Type of Iontophoresis  Dexamethasone    Location  Right anterior lateral shoulder    Dose  70m     Time  6 hour slow release patch             PT Education - 09/24/18 0846    Education Details  heavier band, form with exercise, ultrasound    Person(s) Educated  Patient    Methods  Explanation    Comprehension  Verbalized  understanding       PT Short Term Goals - 09/24/18 06812     PT SHORT TERM GOAL #1   Title  pt will be I with initial HEP    Status  On-going      PT SHORT TERM GOAL #2   Title  pt will be able to raise her Rt arm to 110 deg for improved ADLs    Baseline  today, unable >100 deg    Status  Partially Met      PT SHORT TERM GOAL #3   Title  Pt will be able to report use of ice at home for pain control    Status  On-going      PT SHORT TERM GOAL #4   Title  Pt will report overall less pain with ADLs and normal housework <7/10    Status  On-going        PT Long Term Goals - 09/10/18 1255      PT LONG TERM GOAL #1   Title  Pt will be able to use her Rt UE to aid in lifting kitchen items to shoulder height    Baseline  cannot use even to assist with this activity    Time  6    Period  Weeks    Status  New    Target Date  10/22/18      PT LONG TERM GOAL #2   Title  Pt will have min to no difficulty donning and doffing shirt    Baseline  very difficult, painful    Time  6    Period  Weeks    Target Date  10/22/18      PT LONG TERM GOAL #3   Title  Pt will be able to sleep with less diffculty, sleeping more consecutive hours due to pain relief.    Baseline  wakes frequently, tosses and turns    Time  6    Period  Weeks    Status  New    Target Date  10/22/18      PT LONG TERM GOAL #4   Title  Pt will be able to show increased strength in Rt UE to 4+/5 in shoulder flexion and abduction for maximal strength with lift, carrying    Baseline  3+/5    Time  6    Period  Weeks    Status  New    Target  Date  10/22/18      PT LONG TERM GOAL #5   Title  Pt will be able to reach with Rt arm into back seat with min discomfort.    Baseline  pain is severe with this    Time  6    Period  Weeks    Status  New    Target Date  10/22/18            Plan - 09/24/18 0848    Clinical Impression Statement  Pt with increased pain today.  Did have a favorable result after last  session so we repeated and reinforced gentle consistent HEP.    PT Treatment/Interventions  ADLs/Self Care Home Management;Electrical Stimulation;Patient/family education;Taping;Therapeutic exercise;Iontophoresis '4mg'$ /ml Dexamethasone;Moist Heat;Cryotherapy;Ultrasound;Functional mobility training;Manual techniques;Passive range of motion;Dry needling    PT Next Visit Plan  repeat sleeper stretch for right and side AROM for abduction, try scap mobs ; progress HEP,    PT Home Exercise Plan  table slides ER, flex, scaption, scap squeeze, supine cane, standing rows and extension    Consulted and Agree with Plan of Care  Patient       Patient will benefit from skilled therapeutic intervention in order to improve the following deficits and impairments:  Increased fascial restricitons, Pain, Postural dysfunction, Decreased mobility, Decreased range of motion, Decreased strength, Obesity  Visit Diagnosis: 1. Muscle weakness (generalized)   2. Chronic right shoulder pain        Problem List Patient Active Problem List   Diagnosis Date Noted  . Chronic pain of both knees 05/21/2018  . Chronic right shoulder pain 05/21/2018  . BMI 40.0-44.9, adult ([place]) 05/21/2018  . Anemia 06/05/2013  . 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 Breast cancer of upper-outer quadrant of right female breast ([place]) 11/24/2012  . Nabothian cyst 12/07/2011  . Cervical mass 11/20/2011  . Fibroid uterus 11/20/2011    PAA,JENNIFER 09/24/2018, 8:49 AM  [place], [place], [address] Phone: [phone]   Fax:  [phone]  Name: Sheryl Cox MRN: [id number] Date of Birth: April 26, 1970  Raeford Razor, PT 09/24/18 8:50 AM Phone: [phone] Fax: [phone]

## 2018-09-24 NOTE — Telephone Encounter (Signed)
I called patient. Patient can purchase tens unit online.

## 2018-09-26 ENCOUNTER — Encounter

## 2018-09-29 ENCOUNTER — Ambulatory Visit: Payer: Medicaid Other | Admitting: Physical Therapy

## 2018-10-01 ENCOUNTER — Other Ambulatory Visit: Payer: Self-pay

## 2018-10-01 ENCOUNTER — Ambulatory Visit: Payer: Medicaid Other | Admitting: Physical Therapy

## 2018-10-02 ENCOUNTER — Encounter

## 2018-10-02 ENCOUNTER — Encounter: Payer: Self-pay | Admitting: Physical Therapy

## 2018-10-02 ENCOUNTER — Ambulatory Visit: Payer: Medicaid Other | Admitting: Physical Therapy

## 2018-10-02 DIAGNOSIS — M25511 Pain in right shoulder: Secondary | ICD-10-CM

## 2018-10-02 DIAGNOSIS — M6281 Muscle weakness (generalized): Secondary | ICD-10-CM | POA: Diagnosis not present

## 2018-10-02 DIAGNOSIS — G8929 Other chronic pain: Secondary | ICD-10-CM

## 2018-10-02 NOTE — Patient Instructions (Signed)
Strengthening: Resisted Flexion    Hold tubing with left arm at side. Pull forward and up. Move shoulder through pain-free range of motion. Repeat __10__ times per set. Do __2__ sets per session. Do _2___ sessions per day.  http://orth.exer.us/825   Copyright  VHI. All rights reserved.  Shoulder Flexion: Standing - Diagonal 2 (Single Arm)    In shoulder width stance, anchor tubing at hip opposite exercising arm. Thumb in, arm across body, pull forward and up, rotating to thumb back. Repeat _10_ times per set. Repeat with other arm. Do __2 sets per session. Do _3_ sessions per week.   http://tub.exer.us/266   Copyright  VHI. All rights reserved.

## 2018-10-02 NOTE — Therapy (Signed)
[place], [place], [address] Phone: [phone]   Fax:  [phone]  Physical Therapy Treatment  Patient Details  Name: Sheryl Cox MRN: [id number] Date of Birth: [birth year] Referring Provider (PT): Dr. Durward Fortes    Encounter Date: 10/02/2018  PT End of Session - 10/02/18 1408    Visit Number  7    Number of Visits  16    Date for PT Re-Evaluation  10/22/18    Authorization Type  MCD, approved 12 additional treatments    Authorization Time Period  09/16/18-10/27/18    Authorization - Visit Number  3    Authorization - Number of Visits  12    PT Start Time  7867    PT Stop Time  1450    PT Time Calculation (min)  46 min    Activity Tolerance  Patient tolerated treatment well    Behavior During Therapy  [facility] for tasks assessed/performed       Past Medical History:  Diagnosis Date  . Anemia    history of blood transfusion-no abnormal reaction noted  . Asthma    Albuterol as needed  . 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 Diabetes mellitus without complication ([place])    takes Metformin daily  . History of migraine    several yrs ago  . Hypertension    takes Tribenzor daily    Past Surgical History:  Procedure Laterality Date  . BIOPSY BREAST Left 12/05/12   fibroadenoma  . BREAST LUMPECTOMY     2014  . BREAST SURGERY Right   . CESAREAN SECTION  01/05/2009  . CHOLECYSTECTOMY N/A 01/31/2016   Procedure: LAPAROSCOPIC CHOLECYSTECTOMY;  Surgeon: Erroll Luna, MD;  Location: [place];  Service: General;  Laterality: N/A;  . DILITATION & CURRETTAGE/HYSTROSCOPY WITH HYDROTHERMAL ABLATION N/A 08/12/2013   Procedure: DILATATION & CURETTAGE/HYSTEROSCOPY WITH HYDROTHERMAL ABLATION;  Surgeon: Frederico Hamman, MD;  Location: [facility];  Service: Gynecology;  Laterality: N/A;  . ROBOTIC ASSISTED TOTAL HYSTERECTOMY N/A 06/18/2017   Procedure: XI ROBOTIC ASSISTED TOTAL  HYSTERECTOMY;  Surgeon: Lavonia Drafts, MD;  [facility];  Service: Gynecology;  Laterality: N/A;  . SALPINGOOPHORECTOMY Bilateral 06/18/2017   Procedure: BILATERAL SALPINGO OOPHORECTOMY;  Surgeon: Lavonia Drafts, MD;  [facility];  Service: Gynecology;  Laterality: Bilateral;    There were no vitals filed for this visit.  Subjective Assessment - 10/02/18 1405    Subjective  Shoulder is OK.  Been doing her band exercises.    Currently in Pain?  Yes    Pain Score  7     Pain Location  Shoulder    Pain Orientation  Right    Pain Descriptors / Indicators  Aching    Pain Type  Chronic pain    Pain Onset  More than a month ago    Pain Frequency  Intermittent    Aggravating Factors   cold air    Pain Relieving Factors  ibuprofen          [facility] Adult PT Treatment/Exercise - 10/02/18 0001      Shoulder Exercises: Supine   External Rotation  Strengthening;Right;15 reps    External Rotation Weight (lbs)  3    Flexion  Strengthening;Right;10 reps    Theraband Level (Shoulder Flexion)  Level 1 (Yellow)    Shoulder Flexion Weight (lbs)  3 lb punch and yellow     ABduction  Strengthening;Right;10 reps    Shoulder ABduction  Weight (lbs)  3    Diagonals  Strengthening;Right;10 reps    Theraband Level (Shoulder Diagonals)  Level 1 (Yellow)      Shoulder Exercises: Seated   Horizontal ABduction  AAROM;Strengthening;Right;10 reps    External Rotation  Strengthening;Both;10 reps    Theraband Level (Shoulder External Rotation)  Level 3 (Green)    Flexion  AAROM;Right;15 reps    Flexion Weight (lbs)  UE ranger     Abduction  AAROM;Right;15 reps    ABduction Weight (lbs)  UE Ranger    Other Seated Exercises  UE Ranger circles x 10 each direction       Shoulder Exercises: Sidelying   External Rotation  Strengthening;Right;20 reps;Weights    External Rotation Weight (lbs)  3    ABduction  Strengthening;Right;10 reps    ABduction Weight (lbs)  3      Shoulder  Exercises: Pulleys   Flexion  2 minutes    Scaption  2 minutes      Shoulder Exercises: Stretch   Cross Chest Stretch  3 reps;30 seconds    External Rotation Stretch  3 reps;30 seconds      Cryotherapy   Number Minutes Cryotherapy  15 Minutes    Cryotherapy Location  Shoulder    Type of Cryotherapy  Ice pack      Electrical Stimulation   Electrical Stimulation Location  Rt shoulder    Electrical Stimulation Action  IFC x15    Electrical Stimulation Parameters  19    Electrical Stimulation Goals  Pain             PT Education - 10/02/18 1446    Education Details  HEP    Person(s) Educated  Patient    Methods  Explanation    Comprehension  Verbalized understanding       PT Short Term Goals - 10/02/18 1447      PT SHORT TERM GOAL #1   Title  pt will be I with initial HEP    Status  Achieved      PT SHORT TERM GOAL #2   Title  pt will be able to raise her Rt arm to 110 deg for improved ADLs    Status  Partially Met      PT SHORT TERM GOAL #3   Title  Pt will be able to report use of ice at home for pain control    Status  Achieved      PT SHORT TERM GOAL #4   Title  Pt will report overall less pain with ADLs and normal housework <7/10    Status  On-going        PT Long Term Goals - 09/10/18 1255      PT LONG TERM GOAL #1   Title  Pt will be able to use her Rt UE to aid in lifting kitchen items to shoulder height    Baseline  cannot use even to assist with this activity    Time  6    Period  Weeks    Status  New    Target Date  10/22/18      PT LONG TERM GOAL #2   Title  Pt will have min to no difficulty donning and doffing shirt    Baseline  very difficult, painful    Time  6    Period  Weeks    Target Date  10/22/18      PT LONG TERM GOAL #3   Title  Pt  will be able to sleep with less diffculty, sleeping more consecutive hours due to pain relief.    Baseline  wakes frequently, tosses and turns    Time  6    Period  Weeks    Status  New     Target Date  10/22/18      PT LONG TERM GOAL #4   Title  Pt will be able to show increased strength in Rt UE to 4+/5 in shoulder flexion and abduction for maximal strength with lift, carrying    Baseline  3+/5    Time  6    Period  Weeks    Status  New    Target Date  10/22/18      PT LONG TERM GOAL #5   Title  Pt will be able to reach with Rt arm into back seat with min discomfort.    Baseline  pain is severe with this    Time  6    Period  Weeks    Status  New    Target Date  10/22/18            Plan - 10/02/18 1410    Clinical Impression Statement  Strength improving despite high reported levels of pain.  Goals in progress    PT Treatment/Interventions  ADLs/Self Care Home Management;Electrical Stimulation;Patient/family education;Taping;Therapeutic exercise;Iontophoresis 9m/ml Dexamethasone;Moist Heat;Cryotherapy;Ultrasound;Functional mobility training;Manual techniques;Passive range of motion;Dry needling    PT Next Visit Plan  repeat sleeper stretch for right and side AROM for abduction, try scap mobs ; progress HEP    PT Home Exercise Plan  table slides ER, flex, scaption, scap squeeze, supine cane, standing rows and extension, flexion and diagonal Yellow band       Patient will benefit from skilled therapeutic intervention in order to improve the following deficits and impairments:  Increased fascial restricitons, Pain, Postural dysfunction, Decreased mobility, Decreased range of motion, Decreased strength, Obesity  Visit Diagnosis: 1. Muscle weakness (generalized)   2. Chronic right shoulder pain        Problem List Patient Active Problem List   Diagnosis Date Noted  . Chronic pain of both knees 05/21/2018  . Chronic right shoulder pain 05/21/2018  . BMI 40.0-44.9, adult ([place] 05/21/2018  . Anemia 06/05/2013  . 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 Breast cancer of upper-outer quadrant of right female breast ([facility] 11/24/2012  . Nabothian cyst  12/07/2011  . Cervical mass 11/20/2011  . Fibroid uterus 11/20/2011    Aadon Gorelik 10/02/2018, 2:51 PM  [facility] [address]Phone: [phone]  Fax:  [phone] Name: DGEONNA LOCKYERMRN: [id number] of Birth: 103/30/1972 JRaeford Razor PT 10/02/18 2:51 PM Phone: [phone]: [phone]

## 2018-10-06 ENCOUNTER — Encounter: Payer: Self-pay | Admitting: Physical Therapy

## 2018-10-06 ENCOUNTER — Ambulatory Visit: Payer: Medicaid Other | Admitting: Physical Therapy

## 2018-10-06 DIAGNOSIS — M6281 Muscle weakness (generalized): Secondary | ICD-10-CM | POA: Diagnosis not present

## 2018-10-06 DIAGNOSIS — G8929 Other chronic pain: Secondary | ICD-10-CM

## 2018-10-06 DIAGNOSIS — M25511 Pain in right shoulder: Secondary | ICD-10-CM

## 2018-10-06 NOTE — Therapy (Signed)
[place], [place], [address] Phone: [phone]   Fax:  [phone]  Physical Therapy Treatment  Patient Details  Name: Sheryl Cox MRN: [id number] Date of Birth: 1970-10-12 Referring Provider (PT): Dr. Durward Fortes    Encounter Date: 10/06/2018  PT End of Session - 10/06/18 1320    Visit Number  8    Number of Visits  16    Date for PT Re-Evaluation  10/22/18    Authorization Type  MCD, approved 12 additional treatments    Authorization - Visit Number  4    Authorization - Number of Visits  12    PT Start Time  1320    PT Stop Time  1408    PT Time Calculation (min)  48 min    Activity Tolerance  Patient tolerated treatment well    Behavior During Therapy  [facility] for tasks assessed/performed       Past Medical History:  Diagnosis Date  . Anemia    history of blood transfusion-no abnormal reaction noted  . Asthma    Albuterol as needed  . 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 Diabetes mellitus without complication ([place])    takes Metformin daily  . History of migraine    several yrs ago  . Hypertension    takes Tribenzor daily    Past Surgical History:  Procedure Laterality Date  . BIOPSY BREAST Left 12/05/12   fibroadenoma  . BREAST LUMPECTOMY     2014  . BREAST SURGERY Right   . CESAREAN SECTION  01/05/2009  . CHOLECYSTECTOMY N/A 01/31/2016   Procedure: LAPAROSCOPIC CHOLECYSTECTOMY;  Surgeon: Erroll Luna, MD;  Location: [place];  Service: General;  Laterality: N/A;  . DILITATION & CURRETTAGE/HYSTROSCOPY WITH HYDROTHERMAL ABLATION N/A 08/12/2013   Procedure: DILATATION & CURETTAGE/HYSTEROSCOPY WITH HYDROTHERMAL ABLATION;  Surgeon: Frederico Hamman, MD;  Location: [facility];  Service: Gynecology;  Laterality: N/A;  . ROBOTIC ASSISTED TOTAL HYSTERECTOMY N/A 06/18/2017   Procedure: XI ROBOTIC ASSISTED TOTAL HYSTERECTOMY;  Surgeon: Lavonia Drafts, MD;   [facility];  Service: Gynecology;  Laterality: N/A;  . SALPINGOOPHORECTOMY Bilateral 06/18/2017   Procedure: BILATERAL SALPINGO OOPHORECTOMY;  Surgeon: Lavonia Drafts, MD;  [facility];  Service: Gynecology;  Laterality: Bilateral;    There were no vitals filed for this visit.   [place] Adult PT Treatment/Exercise - 10/06/18 0001      Shoulder Exercises: Supine   Horizontal ABduction  Strengthening;Both;15 reps    Theraband Level (Shoulder Horizontal ABduction)  Level 1 (Yellow)    Diagonals  Strengthening;Right;10 reps    Theraband Level (Shoulder Diagonals)  Level 1 (Yellow)    Other Supine Exercises  scapular retraction x 10       Shoulder Exercises: Seated   Elevation Limitations         Shoulder Exercises: Standing   External Rotation  Strengthening;Both;15 reps;Theraband    Theraband Level (Shoulder External Rotation)  Level 2 (Red)    Flexion  Strengthening;Right;10 reps    Theraband Level (Shoulder Flexion)  Level 2 (Red)    Extension  15 reps    Theraband Level (Shoulder Extension)  Level 3 (Green)    Row  15 reps    Theraband Level (Shoulder Row)  Level 3 (Green)      Cryotherapy   Number Minutes Cryotherapy  15 Minutes    Cryotherapy Location  Shoulder    Type of Cryotherapy  Ice pack  Land Action  15 min IFC     Electrical Stimulation Parameters  22    Electrical Stimulation Goals  Pain      Manual Therapy   Manual Therapy  Passive ROM    Soft tissue mobilization  Rt deltoid, anterior shoulder,    Passive ROM  flexion, ER and abduction, light as tolerated                PT Short Term Goals - 10/02/18 1447      PT SHORT TERM GOAL #1   Title  pt will be I with initial HEP    Status  Achieved      PT SHORT TERM GOAL #2   Title  pt will be able to raise her Rt arm to 110 deg for improved ADLs    Status  Partially Met      PT SHORT TERM  GOAL #3   Title  Pt will be able to report use of ice at home for pain control    Status  Achieved      PT SHORT TERM GOAL #4   Title  Pt will report overall less pain with ADLs and normal housework <7/10    Status  On-going        PT Long Term Goals - 09/10/18 1255      PT LONG TERM GOAL #1   Title  Pt will be able to use her Rt UE to aid in lifting kitchen items to shoulder height    Baseline  cannot use even to assist with this activity    Time  6    Period  Weeks    Status  New    Target Date  10/22/18      PT LONG TERM GOAL #2   Title  Pt will have min to no difficulty donning and doffing shirt    Baseline  very difficult, painful    Time  6    Period  Weeks    Target Date  10/22/18      PT LONG TERM GOAL #3   Title  Pt will be able to sleep with less diffculty, sleeping more consecutive hours due to pain relief.    Baseline  wakes frequently, tosses and turns    Time  6    Period  Weeks    Status  New    Target Date  10/22/18      PT LONG TERM GOAL #4   Title  Pt will be able to show increased strength in Rt UE to 4+/5 in shoulder flexion and abduction for maximal strength with lift, carrying    Baseline  3+/5    Time  6    Period  Weeks    Status  New    Target Date  10/22/18      PT LONG TERM GOAL #5   Title  Pt will be able to reach with Rt arm into back seat with min discomfort.    Baseline  pain is severe with this    Time  6    Period  Weeks    Status  New    Target Date  10/22/18            Plan - 10/06/18 1329    Clinical Impression Statement  Patient with increased pain due to swatting a bee yesterday when she was grilling.  Bursitis  and arthritis explained to the patient. Gentle strengthening as tolerated, making slow progress.    Personal Factors and Comorbidities  Comorbidity 1;Comorbidity 2;Time since onset of injury/illness/exacerbation    Examination-Activity Limitations  Lift;Reach Overhead;Transfers;Sleep     Examination-Participation Restrictions  Community Activity    PT Treatment/Interventions  ADLs/Self Care Home Management;Electrical Stimulation;Patient/family education;Taping;Therapeutic exercise;Iontophoresis 70m/ml Dexamethasone;Moist Heat;Cryotherapy;Ultrasound;Functional mobility training;Manual techniques;Passive range of motion;Dry needling    PT Next Visit Plan  repeat sleeper stretch for right and side AROM for abduction, try scap mobs ; progress HEP    PT Home Exercise Plan  table slides ER, flex, scaption, scap squeeze, supine cane, standing rows and extension, flexion and diagonal Yellow band       Patient will benefit from skilled therapeutic intervention in order to improve the following deficits and impairments:  Increased fascial restricitons, Pain, Postural dysfunction, Decreased mobility, Decreased range of motion, Decreased strength, Obesity  Visit Diagnosis: 1. Muscle weakness (generalized)   2. Chronic right shoulder pain        Problem List Patient Active Problem List   Diagnosis Date Noted  . Chronic pain of both knees 05/21/2018  . Chronic right shoulder pain 05/21/2018  . BMI 40.0-44.9, adult ([place] 05/21/2018  . Anemia 06/05/2013  . 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 Breast cancer of upper-outer quadrant of right female breast ([facility] 11/24/2012  . Nabothian cyst 12/07/2011  . Cervical mass 11/20/2011  . Fibroid uterus 11/20/2011    , 10/06/2018, 1:58 PM  [facility] [address]Phone: [phone]  Fax:  [phone] Name: DMARICSA SAMMONSMRN: [id number] of Birth: [birth year] JRaeford Razor PT 10/06/18 1:58 PM Phone: [phone]: [phone]

## 2018-10-08 ENCOUNTER — Other Ambulatory Visit: Payer: Self-pay

## 2018-10-08 ENCOUNTER — Encounter: Payer: Self-pay | Admitting: Physical Therapy

## 2018-10-08 ENCOUNTER — Ambulatory Visit: Payer: Medicaid Other | Admitting: Physical Therapy

## 2018-10-08 DIAGNOSIS — M6281 Muscle weakness (generalized): Secondary | ICD-10-CM

## 2018-10-08 DIAGNOSIS — G8929 Other chronic pain: Secondary | ICD-10-CM

## 2018-10-08 NOTE — Therapy (Signed)
[place], [place], [address] Phone: [phone]   Fax:  [phone]  Physical Therapy Treatment  Patient Details  Name: Sheryl Cox MRN: [id number] Date of Birth: 1970-12-04 Referring Provider (PT): Dr. Durward Fortes    Encounter Date: 10/08/2018  PT End of Session - 10/08/18 1331    Visit Number  9    Number of Visits  16    Date for PT Re-Evaluation  10/22/18    Authorization Type  MCD, approved 12 additional treatments    Authorization Time Period  09/16/18-10/27/18    Authorization - Visit Number  5    Authorization - Number of Visits  12    PT Start Time  1030    PT Stop Time  1505    PT Time Calculation (min)  45 min    Activity Tolerance  Patient tolerated treatment well    Behavior During Therapy  Sheryl Cox for tasks assessed/performed       Past Medical History:  Diagnosis Date  . Anemia    history of blood transfusion-no abnormal reaction noted  . Asthma    Albuterol as needed  . 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 Diabetes mellitus without complication ([place])    takes Metformin daily  . History of migraine    several yrs ago  . Hypertension    takes Tribenzor daily    Past Surgical History:  Procedure Laterality Date  . BIOPSY BREAST Left 12/05/12   fibroadenoma  . BREAST LUMPECTOMY     2014  . BREAST SURGERY Right   . CESAREAN SECTION  01/05/2009  . CHOLECYSTECTOMY N/A 01/31/2016   Procedure: LAPAROSCOPIC CHOLECYSTECTOMY;  Surgeon: Erroll Luna, MD;  Location: [place];  Service: General;  Laterality: N/A;  . DILITATION & CURRETTAGE/HYSTROSCOPY WITH HYDROTHERMAL ABLATION N/A 08/12/2013   Procedure: DILATATION & CURETTAGE/HYSTEROSCOPY WITH HYDROTHERMAL ABLATION;  Surgeon: Frederico Hamman, MD;  Location: [facility];  Service: Gynecology;  Laterality: N/A;  . ROBOTIC ASSISTED TOTAL HYSTERECTOMY N/A 06/18/2017   Procedure: XI ROBOTIC ASSISTED TOTAL  HYSTERECTOMY;  Surgeon: Lavonia Drafts, MD;  [facility];  Service: Gynecology;  Laterality: N/A;  . SALPINGOOPHORECTOMY Bilateral 06/18/2017   Procedure: BILATERAL SALPINGO OOPHORECTOMY;  Surgeon: Lavonia Drafts, MD;  [facility];  Service: Gynecology;  Laterality: Bilateral;    There were no vitals filed for this visit.  Subjective Assessment - 10/08/18 1329    Subjective  Its ok today, 6/10.  What you did last time really helped.    Currently in Pain?  Yes    Pain Score  6     Pain Location  Shoulder    Pain Orientation  Right    Pain Descriptors / Indicators  Aching    Pain Type  Chronic pain    Pain Onset  More than a month ago    Pain Frequency  Intermittent    Aggravating Factors   reaching up and back    Pain Relieving Factors  ibuprofen, ice             [facility] Adult PT Treatment/Exercise - 10/08/18 0001      Shoulder Exercises: Supine   Horizontal ABduction  Strengthening;Both;10 reps    Theraband Level (Shoulder Horizontal ABduction)  Level 1 (Yellow)    External Rotation  Strengthening;Both;20 reps    Theraband Level (Shoulder External Rotation)  Level 1 (Yellow)    External Rotation Weight (lbs)  AAROM cane x 10  Flexion  Strengthening;Right;10 reps    Theraband Level (Shoulder Flexion)  Level 1 (Yellow)      Shoulder Exercises: Pulleys   Flexion  2 minutes    Scaption  2 minutes    Other Pulley Exercises  standing towel stretches for extension x 8 and then IR x 10       Cryotherapy   Number Minutes Cryotherapy  15 Minutes    Cryotherapy Location  Shoulder    Type of Cryotherapy  Ice pack      Electrical Stimulation   Electrical Stimulation Location  Rt shoulder    Electrical Stimulation Action  15 min IFC     Electrical Stimulation Parameters  22    Electrical Stimulation Goals  Pain               PT Short Term Goals - 10/02/18 1447      PT SHORT TERM GOAL #1   Title  pt will be I with initial HEP    Status   Achieved      PT SHORT TERM GOAL #2   Title  pt will be able to raise her Rt arm to 110 deg for improved ADLs    Status  Partially Met      PT SHORT TERM GOAL #3   Title  Pt will be able to report use of ice at home for pain control    Status  Achieved      PT SHORT TERM GOAL #4   Title  Pt will report overall less pain with ADLs and normal housework <7/10    Status  On-going        PT Long Term Goals - 10/08/18 1346      PT LONG TERM GOAL #1   Title  Pt will be able to use her Rt UE to aid in lifting kitchen items to shoulder height    Baseline  tries to but its painful    Status  On-going      PT LONG TERM GOAL #2   Title  Pt will have min to no difficulty donning and doffing shirt    Baseline  no better    Status  On-going      PT LONG TERM GOAL #3   Title  Pt will be able to sleep with less diffculty, sleeping more consecutive hours due to pain relief.    Baseline  a bit better    Status  On-going      PT LONG TERM GOAL #4   Title  Pt will be able to show increased strength in Rt UE to 4+/5 in shoulder flexion and abduction for maximal strength with lift, carrying    Status  On-going      PT LONG TERM GOAL #5   Title  Pt will be able to reach with Rt arm into back seat with min discomfort.    Baseline  pain is severe with this    Status  On-going            Plan - 10/08/18 1350    Clinical Impression Statement  Patient is making slow gains but showing independence and less pain at rest.  Encouraged her to get home TENS, has the number for a rep who can assist.    PT Treatment/Interventions  ADLs/Self Care Home Management;Electrical Stimulation;Patient/family education;Taping;Therapeutic exercise;Iontophoresis '4mg'$ /ml Dexamethasone;Moist Heat;Cryotherapy;Ultrasound;Functional mobility training;Manual techniques;Passive range of motion;Dry needling    PT Next Visit Plan  repeat sleeper stretch for right  and side AROM for abduction, try scap mobs ; progress HEP     PT Home Exercise Plan  table slides ER, flex, scaption, scap squeeze, supine cane, standing rows and extension, flexion and diagonal Yellow band    Consulted and Agree with Plan of Care  Patient       Patient will benefit from skilled therapeutic intervention in order to improve the following deficits and impairments:  Increased fascial restricitons, Pain, Postural dysfunction, Decreased mobility, Decreased range of motion, Decreased strength, Obesity  Visit Diagnosis: 1. Muscle weakness (generalized)   2. Chronic right shoulder pain        Problem List Patient Active Problem List   Diagnosis Date Noted  . Chronic pain of both knees 05/21/2018  . Chronic right shoulder pain 05/21/2018  . BMI 40.0-44.9, adult ([place]) 05/21/2018  . Anemia 06/05/2013  . 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 Breast cancer of upper-outer quadrant of right female breast ([place]) 11/24/2012  . Nabothian cyst 12/07/2011  . Cervical mass 11/20/2011  . Fibroid uterus 11/20/2011    Aditya Nastasi 10/08/2018, 1:54 PM  [facility] [address] [place], [place], [address] Phone: [phone]   Fax:  [phone]  Name: Sheryl Cox MRN: [id number] Date of Birth: October 27, 1970  Raeford Razor, PT 10/08/18 1:54 PM Phone: [phone] Fax: [phone]

## 2018-10-20 ENCOUNTER — Ambulatory Visit: Payer: Medicaid Other | Attending: Orthopaedic Surgery | Admitting: Physical Therapy

## 2018-10-20 ENCOUNTER — Other Ambulatory Visit: Payer: Self-pay

## 2018-10-20 ENCOUNTER — Encounter: Payer: Self-pay | Admitting: Physical Therapy

## 2018-10-20 DIAGNOSIS — M25511 Pain in right shoulder: Secondary | ICD-10-CM | POA: Diagnosis present

## 2018-10-20 DIAGNOSIS — M6281 Muscle weakness (generalized): Secondary | ICD-10-CM | POA: Diagnosis present

## 2018-10-20 DIAGNOSIS — G8929 Other chronic pain: Secondary | ICD-10-CM | POA: Insufficient documentation

## 2018-10-20 NOTE — Patient Instructions (Signed)
Resisted External Rotation: in Neutral - Bilateral    Sit or stand, tubing in both hands, elbows at sides, bent to 90, forearms forward. Pinch shoulder blades together and rotate forearms out. Keep elbows at sides. Repeat ___10_ times per set. Do ___2_ sets per session. Do __1-2__ sessions per day.  http://orth.exer.us/967   Copyright  VHI. All rights reserved.

## 2018-10-20 NOTE — Therapy (Signed)
[place], [place], [address] Phone: [phone]   Fax:  [phone]  Physical Therapy Treatment  Patient Details  Name: Sheryl Cox MRN: [id number] Date of Birth: [birth year] Referring Provider (PT): Dr. Durward Fortes    Encounter Date: 10/20/2018  PT End of Session - 10/20/18 0845    Visit Number  10    Number of Visits  16    Date for PT Re-Evaluation  10/22/18    Authorization Type  MCD, approved 12 additional treatments    Authorization - Visit Number  6    Authorization - Number of Visits  12    PT Start Time  [phone]    PT Stop Time  0909    PT Time Calculation (min)  36 min    Activity Tolerance  Patient tolerated treatment well    Behavior During Therapy  [facility] for tasks assessed/performed       Past Medical History:  Diagnosis Date  . Anemia    history of blood transfusion-no abnormal reaction noted  . Asthma    Albuterol as needed  . 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 Diabetes mellitus without complication ([place])    takes Metformin daily  . History of migraine    several yrs ago  . Hypertension    takes Tribenzor daily    Past Surgical History:  Procedure Laterality Date  . BIOPSY BREAST Left 12/05/12   fibroadenoma  . BREAST LUMPECTOMY     2014  . BREAST SURGERY Right   . CESAREAN SECTION  01/05/2009  . CHOLECYSTECTOMY N/A 01/31/2016   Procedure: LAPAROSCOPIC CHOLECYSTECTOMY;  Surgeon: Erroll Luna, MD;  Location: [facility];  Service: General;  Laterality: N/A;  . DILITATION & CURRETTAGE/HYSTROSCOPY WITH HYDROTHERMAL ABLATION N/A 08/12/2013   Procedure: DILATATION & CURETTAGE/HYSTEROSCOPY WITH HYDROTHERMAL ABLATION;  Surgeon: Frederico Hamman, MD;  Location: [facility];  Service: Gynecology;  Laterality: N/A;  . ROBOTIC ASSISTED TOTAL HYSTERECTOMY N/A 06/18/2017   Procedure: XI ROBOTIC ASSISTED TOTAL HYSTERECTOMY;  Surgeon: Lavonia Drafts, MD;   Location: [facility];  Service: Gynecology;  Laterality: N/A;  . SALPINGOOPHORECTOMY Bilateral 06/18/2017   Procedure: BILATERAL SALPINGO OOPHORECTOMY;  Surgeon: Lavonia Drafts, MD;  Location: [facility];  Service: Gynecology;  Laterality: Bilateral;    There were no vitals filed for this visit.  Subjective Assessment - 10/20/18 0831    Subjective  6/10 today. Nothing new. Pt asking about what other options she has for her shoulder.    Currently in Pain?  Yes    Pain Score  6     Pain Location  Shoulder    Pain Orientation  Right    Pain Descriptors / Indicators  Sore    Pain Type  Chronic pain    Pain Onset  More than a month ago    Pain Frequency  Intermittent    Aggravating Factors   reaching    Pain Relieving Factors  ibuprofen, ice             [facility] Adult PT Treatment/Exercise - 10/20/18 0001      Self-Care   Self-Care  RICE;Posture;Heat/Ice Application;Other Self-Care Comments    RICE  ice post ex     Posture  to help reduce tension on shoulder cuff     Other Self-Care Comments   HEP, options       Shoulder Exercises: Supine   Horizontal ABduction  Strengthening;Both;10 reps    Theraband Level (  Shoulder Horizontal ABduction)  Level 2 (Red)    External Rotation  Strengthening;Both;20 reps    Theraband Level (Shoulder External Rotation)  Level 2 (Red)    External Rotation Weight (lbs)  AAROM cane x 10    correction fotr technique    Flexion  Strengthening;Both;10 reps    Theraband Level (Shoulder Flexion)  Level 2 (Red)    Shoulder Flexion Weight (lbs)  narrow grip for scap stab       Shoulder Exercises: ROM/Strengthening   UBE (Upper Arm Bike)  L1 5 min corrected posture     Other ROM/Strengthening Exercises  supine flexion with cane x 10       Ultrasound   Ultrasound Location  Rt anterior shoulder     Ultrasound Parameters  50% 1.2 W/cm2 , 1 mHz    Ultrasound Goals  Pain             PT Education - 10/20/18 0911    Education Details  options for  shoulder: more PT, consistency, injections, surgery?, HEP    Person(s) Educated  Patient    Methods  Explanation;Handout;Verbal cues    Comprehension  Verbalized understanding       PT Short Term Goals - 10/02/18 1447      PT SHORT TERM GOAL #1   Title  pt will be I with initial HEP    Status  Achieved      PT SHORT TERM GOAL #2   Title  pt will be able to raise her Rt arm to 110 deg for improved ADLs    Status  Partially Met      PT SHORT TERM GOAL #3   Title  Pt will be able to report use of ice at home for pain control    Status  Achieved      PT SHORT TERM GOAL #4   Title  Pt will report overall less pain with ADLs and normal housework <7/10    Status  On-going        PT Long Term Goals - 10/08/18 1346      PT LONG TERM GOAL #1   Title  Pt will be able to use her Rt UE to aid in lifting kitchen items to shoulder height    Baseline  tries to but its painful    Status  On-going      PT LONG TERM GOAL #2   Title  Pt will have min to no difficulty donning and doffing shirt    Baseline  no better    Status  On-going      PT LONG TERM GOAL #3   Title  Pt will be able to sleep with less diffculty, sleeping more consecutive hours due to pain relief.    Baseline  a bit better    Status  On-going      PT LONG TERM GOAL #4   Title  Pt will be able to show increased strength in Rt UE to 4+/5 in shoulder flexion and abduction for maximal strength with lift, carrying    Status  On-going      PT LONG TERM GOAL #5   Title  Pt will be able to reach with Rt arm into back seat with min discomfort.    Baseline  pain is severe with this    Status  On-going            Plan - 10/20/18 0912    Clinical Impression Statement  Patient with  minor increase in function since onset of PT.  She has pain >5/10 most of the time, not willing to consider injections or surgical options, which i support.  She may benefit from continued conservative care for her shoulder bursitis,  arthritis.    PT Treatment/Interventions  ADLs/Self Care Home Management;Electrical Stimulation;Patient/family education;Taping;Therapeutic exercise;Iontophoresis '4mg'$ /ml Dexamethasone;Moist Heat;Cryotherapy;Ultrasound;Functional mobility training;Manual techniques;Passive range of motion;Dry needling    PT Next Visit Plan  DC vs cont/renew, measure, goals,  progress HEP    PT Home Exercise Plan  table slides ER, flex, scaption, scap squeeze, supine cane, standing rows and extension, flexion and diagonal Yellow band, red band ER unattached    Consulted and Agree with Plan of Care  Patient       Patient will benefit from skilled therapeutic intervention in order to improve the following deficits and impairments:  Increased fascial restricitons, Pain, Postural dysfunction, Decreased mobility, Decreased range of motion, Decreased strength, Obesity  Visit Diagnosis: 1. Muscle weakness (generalized)   2. Chronic right shoulder pain        Problem List Patient Active Problem List   Diagnosis Date Noted  . Chronic pain of both knees 05/21/2018  . Chronic right shoulder pain 05/21/2018  . BMI 40.0-44.9, adult ([place]) 05/21/2018  . Anemia 06/05/2013  . 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 Breast cancer of upper-outer quadrant of right female breast ([facility]) 11/24/2012  . Nabothian cyst 12/07/2011  . Cervical mass 11/20/2011  . Fibroid uterus 11/20/2011    , 10/20/2018, 9:17 AM  [place], [place], [address] Phone: [phone]   Fax:  [phone]  Name: Sheryl Cox MRN: [id number] Date of Birth: Apr 15, 1970  Raeford Razor, PT 10/20/18 9:18 AM Phone: [phone] Fax: [phone]

## 2018-10-22 ENCOUNTER — Ambulatory Visit: Payer: Medicaid Other | Admitting: Physical Therapy

## 2018-10-23 ENCOUNTER — Encounter: Payer: Self-pay | Admitting: Physical Therapy

## 2018-10-23 ENCOUNTER — Ambulatory Visit: Payer: Medicaid Other | Admitting: Physical Therapy

## 2018-10-23 ENCOUNTER — Other Ambulatory Visit: Payer: Self-pay

## 2018-10-23 DIAGNOSIS — M6281 Muscle weakness (generalized): Secondary | ICD-10-CM | POA: Diagnosis not present

## 2018-10-23 DIAGNOSIS — G8929 Other chronic pain: Secondary | ICD-10-CM

## 2018-10-23 NOTE — Therapy (Signed)
[place], [place], [address] Phone: [phone]   Fax:  [phone]  Physical Therapy Treatment  Patient Details  Name: Sheryl Cox MRN: [id number] Date of Birth: Dec 14, 1970 Referring Provider (PT): Dr. Durward Fortes    Encounter Date: 10/23/2018  PT End of Session - 10/23/18 1036    Visit Number  11    Number of Visits  16    Date for PT Re-Evaluation  10/22/18    Authorization Type  MCD, approved 12 additional treatments    Authorization Time Period  09/16/18-10/27/18    Authorization - Visit Number  7    Authorization - Number of Visits  12    PT Start Time  1017    PT Stop Time  1047    PT Time Calculation (min)  30 min    Activity Tolerance  Patient tolerated treatment well    Behavior During Therapy  [facility] for tasks assessed/performed       Past Medical History:  Diagnosis Date  . Anemia    history of blood transfusion-no abnormal reaction noted  . Asthma    Albuterol as needed  . 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 Diabetes mellitus without complication ([place])    takes Metformin daily  . History of migraine    several yrs ago  . Hypertension    takes Tribenzor daily    Past Surgical History:  Procedure Laterality Date  . BIOPSY BREAST Left 12/05/12   fibroadenoma  . BREAST LUMPECTOMY     2014  . BREAST SURGERY Right   . CESAREAN SECTION  01/05/2009  . CHOLECYSTECTOMY N/A 01/31/2016   Procedure: LAPAROSCOPIC CHOLECYSTECTOMY;  Surgeon: Erroll Luna, MD;  Location: [facility];  Service: General;  Laterality: N/A;  . DILITATION & CURRETTAGE/HYSTROSCOPY WITH HYDROTHERMAL ABLATION N/A 08/12/2013   Procedure: DILATATION & CURETTAGE/HYSTEROSCOPY WITH HYDROTHERMAL ABLATION;  Surgeon: Frederico Hamman, MD;  Location: [facility];  Service: Gynecology;  Laterality: N/A;  . ROBOTIC ASSISTED TOTAL HYSTERECTOMY N/A 06/18/2017   Procedure: XI ROBOTIC ASSISTED TOTAL  HYSTERECTOMY;  Surgeon: Lavonia Drafts, MD;  [facility];  Service: Gynecology;  Laterality: N/A;  . SALPINGOOPHORECTOMY Bilateral 06/18/2017   Procedure: BILATERAL SALPINGO OOPHORECTOMY;  Surgeon: Lavonia Drafts, MD;  [facility];  Service: Gynecology;  Laterality: Bilateral;    There were no vitals filed for this visit.  Subjective Assessment - 10/23/18 1020    Subjective  Having issues with childcare with PT session.  Had to miss last session.  This is her last day.  Only a little pain.  Would like to have info on TENS unit.    Currently in Pain?  Yes    Pain Score  4     Pain Location  Shoulder    Pain Orientation  Right    Pain Descriptors / Indicators  Aching;Sore    Pain Type  Chronic pain    Pain Onset  More than a month ago    Pain Frequency  Intermittent    Aggravating Factors   reaching out, lifting    Pain Relieving Factors  Ibuprofen, ice         [facility] PT Assessment - 10/23/18 0001      AROM   Right Shoulder Flexion  126 Degrees    Right Shoulder ABduction  95 Degrees    Right Shoulder Internal Rotation  50 Degrees   FR to Rt buttock   Right Shoulder External  Rotation  35 Degrees   FR to back neck      Strength   Right Shoulder Flexion  4/5    Right Shoulder ABduction  4/5    Right Shoulder Internal Rotation  4+/5    Right Shoulder External Rotation  4/5          [facility] Adult PT Treatment/Exercise - 10/23/18 0001      Self-Care   Other Self-Care Comments   full HEP, DC, progress       Shoulder Exercises: Supine   Horizontal ABduction  Strengthening;Both;10 reps    Theraband Level (Shoulder Horizontal ABduction)  Level 2 (Red)    External Rotation  Strengthening;Both;20 reps    Theraband Level (Shoulder External Rotation)  Level 2 (Red)      Shoulder Exercises: Standing   Flexion  Strengthening;Right;15 reps    Theraband Level (Shoulder Flexion)  Level 2 (Red)    Shoulder Flexion Weight (lbs)  and then punch x 15 reps     Extension  Strengthening;Both;20 reps;Theraband    Theraband Level (Shoulder Extension)  Level 3 (Green)    Row  Strengthening;Both;Theraband    Theraband Level (Shoulder Row)  Level 3 (Green)       PT Short Term Goals - 10/23/18 1040      PT SHORT TERM GOAL #1   Title  pt will be I with initial HEP    Status  Achieved      PT SHORT TERM GOAL #2   Title  pt will be able to raise her Rt arm to 110 deg for improved ADLs    Status  Achieved      PT SHORT TERM GOAL #3   Title  Pt will be able to report use of ice at home for pain control    Status  Achieved      PT SHORT TERM GOAL #4   Title  Pt will report overall less pain with ADLs and normal housework <7/10    Status  Achieved        PT Long Term Goals - 10/23/18 1041      PT LONG TERM GOAL #1   Title  Pt will be able to use her Rt UE to aid in lifting kitchen items to shoulder height    Status  Achieved      PT LONG TERM GOAL #2   Title  Pt will have min to no difficulty donning and doffing shirt    Baseline  still a challenge    Status  Partially Met      PT LONG TERM GOAL #3   Title  Pt will be able to sleep with less diffculty, sleeping more consecutive hours due to pain relief.    Status  Achieved      PT LONG TERM GOAL #4   Title  Pt will be able to show increased strength in Rt UE to 4+/5 in shoulder flexion and abduction for maximal strength with lift, carrying    Baseline  4/5 to 4+/5    Status  Partially Met      PT LONG TERM GOAL #5   Title  Pt will be able to reach with Rt arm into back seat with min discomfort.    Baseline  pain mod    Status  Partially Met            Plan - 10/23/18 1248    Clinical Impression Statement  Patient has gained UE AROM and strength  but with continued levels of pain.  She will be discharged mostly due to the fact that she cannot safely leave her child in the car during PT.  She may consider coming back to therapy if things change for her.    Personal Factors and  Comorbidities  Comorbidity 1;Comorbidity 2;Time since onset of injury/illness/exacerbation    Comorbidities  knee pain    Examination-Activity Limitations  Lift;Reach Overhead;Transfers;Sleep    Stability/Clinical Decision Making  Evolving/Moderate complexity    PT Treatment/Interventions  ADLs/Self Care Home Management;Electrical Stimulation;Patient/family education;Taping;Therapeutic exercise;Iontophoresis 31m/ml Dexamethasone;Moist Heat;Cryotherapy;Ultrasound;Functional mobility training;Manual techniques;Passive range of motion;Dry needling    PT Next Visit Plan  NA    PT Home Exercise Plan  table slides ER, flex, scaption, scap squeeze, supine cane, standing rows and extension, flexion and diagonal Yellow band, red band ER unattached    Consulted and Agree with Plan of Care  Patient       Patient will benefit from skilled therapeutic intervention in order to improve the following deficits and impairments:  Increased fascial restricitons, Pain, Postural dysfunction, Decreased mobility, Decreased range of motion, Decreased strength, Obesity  Visit Diagnosis: 1. Muscle weakness (generalized)   2. Chronic right shoulder pain        Problem List Patient Active Problem List   Diagnosis Date Noted  . Chronic pain of both knees 05/21/2018  . Chronic right shoulder pain 05/21/2018  . BMI 40.0-44.9, adult ([facility] 05/21/2018  . Anemia 06/05/2013  . 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 Breast cancer of upper-outer quadrant of right female breast ([facility] 11/24/2012  . Nabothian cyst 12/07/2011  . Cervical mass 11/20/2011  . Fibroid uterus 11/20/2011    , 10/23/2018, 12:52 PM  [facility] [place] [address]Phone: [phone]  Fax:  [phone] Name: DTENEE WISHMRN: [id number] of Birth: [birth year] PHYSICAL THERAPY DISCHARGE SUMMARY  Visits from Start of Care: 11  Current  functional level related to goals / functional outcomes: See above for most recent info    Remaining deficits: ROM, pain, strength   Education / Equipment: Posture, HEP, RICE   Plan: Patient agrees to discharge.  Patient goals were partially met. Patient is being discharged due to the patient's request.  ?????    JRaeford Razor PT 10/23/18 12:53 PM Phone: [phone]: [phone]

## 2018-11-11 ENCOUNTER — Telehealth: Payer: Self-pay

## 2018-11-11 NOTE — Telephone Encounter (Signed)
Called patient to do their pre-visit COVID screening.  Call went to voicemail. Unable to do prescreening. Did leave voicemail to have patient call to cancel appointment if she's transferred to Dr. Fransico Setters office. Prescription history shows that she's been sending prescriptions since March 2020

## 2018-11-12 ENCOUNTER — Ambulatory Visit: Payer: Medicaid Other | Admitting: Nurse Practitioner

## 2018-11-19 ENCOUNTER — Ambulatory Visit: Payer: Medicaid Other | Admitting: Nurse Practitioner

## 2018-11-21 ENCOUNTER — Telehealth: Payer: Self-pay

## 2018-11-21 NOTE — Telephone Encounter (Signed)
Called patient to do their pre-visit COVID screening.  Patient has switched back to Dr. [name] for primary care.

## 2018-11-25 ENCOUNTER — Ambulatory Visit: Payer: Medicaid Other

## 2019-03-21 ENCOUNTER — Ambulatory Visit (HOSPITAL_COMMUNITY)
Admission: EM | Admit: 2019-03-21 | Discharge: 2019-03-21 | Disposition: A | Discharge status: Home / Self Care | Payer: Medicaid Other | Attending: Family Medicine | Admitting: Family Medicine

## 2019-03-21 ENCOUNTER — Other Ambulatory Visit: Payer: Self-pay

## 2019-03-21 ENCOUNTER — Encounter (HOSPITAL_COMMUNITY): Payer: Self-pay

## 2019-03-21 DIAGNOSIS — Z7984 Long term (current) use of oral hypoglycemic drugs: Secondary | ICD-10-CM | POA: Insufficient documentation

## 2019-03-21 DIAGNOSIS — I10 Essential (primary) hypertension: Secondary | ICD-10-CM | POA: Diagnosis not present

## 2019-03-21 DIAGNOSIS — E119 Type 2 diabetes mellitus without complications: Secondary | ICD-10-CM | POA: Diagnosis not present

## 2019-03-21 DIAGNOSIS — Z79899 Other long term (current) drug therapy: Secondary | ICD-10-CM | POA: Diagnosis not present

## 2019-03-21 DIAGNOSIS — J45909 Unspecified asthma, uncomplicated: Secondary | ICD-10-CM | POA: Diagnosis not present

## 2019-03-21 DIAGNOSIS — Z20822 Contact with and (suspected) exposure to covid-19: Secondary | ICD-10-CM | POA: Diagnosis present

## 2019-03-21 DIAGNOSIS — Z853 Personal history of malignant neoplasm of breast: Secondary | ICD-10-CM | POA: Insufficient documentation

## 2019-03-21 DIAGNOSIS — Z833 Family history of diabetes mellitus: Secondary | ICD-10-CM | POA: Diagnosis not present

## 2019-03-21 DIAGNOSIS — Z0189 Encounter for other specified special examinations: Secondary | ICD-10-CM

## 2019-03-21 DIAGNOSIS — Z20828 Contact with and (suspected) exposure to other viral communicable diseases: Secondary | ICD-10-CM | POA: Diagnosis not present

## 2019-03-21 NOTE — Discharge Instructions (Signed)
If your Covid-19 test is positive, you will receive a phone call from [facility] regarding your results. Negative test results are not called. Both positive and negative results area always visible on MyChart. If you do not have a MyChart account, sign up instructions are in your discharge papers.  

## 2019-03-21 NOTE — ED Triage Notes (Signed)
Patient presents to Urgent Care with complaints of needing covid testing to return to work since there was an outbreak at her job. Patient reports she has no sx.

## 2019-03-22 LAB — NOVEL CORONAVIRUS, NAA (HOSP ORDER, SEND-OUT TO REF LAB; TAT 18-24 HRS): SARS-CoV-2, NAA: NOT DETECTED

## 2019-03-23 NOTE — ED Provider Notes (Signed)
[place] [facility]   [id number] 03/21/19 Arrival Time: [id number]  ASSESSMENT & PLAN:  1. Patient request for diagnostic testing      COVID-19 testing sent. To self-quarantine until results are available. If requested, work note provided.  Follow-up Information    Lucianne Lei, MD.   Specialty: Family Medicine Why: As needed. Contact information: [address] [place] [address] [phone]           Reviewed expectations re: course of current medical issues. Questions answered. Outlined signs and symptoms indicating need for more acute intervention. Patient verbalized understanding. After Visit Summary given.   SUBJECTIVE: History from: patient. Sheryl Cox is a 49 y.o. female who requests COVID-19 testing. Known COVID-19 contact: questions co-workers. Recent travel: none. Denies: runny nose, congestion, fever, cough, sore throat, difficulty breathing and headache. Normal PO intake without n/v/d. "I feel fine".  ROS: As per HPI.   OBJECTIVE:  Vitals:   03/21/19 1428  BP: (!) 143/95  Pulse: (!) 118  Resp: 18  Temp: 98.9 F (37.2 C)  SpO2: 98%    General appearance: alert; no distress Eyes: PERRLA; EOMI; conjunctiva normal HENT: [place]; AT; nasal mucosa normal; oral mucosa normal Neck: supple  Lungs: speaks full sentences without difficulty; unlabored Heart: regular rate and rhythm (recheck HR 98) Abdomen: soft, non-tender Extremities: no edema Skin: warm and dry Neurologic: normal gait Psychological: alert and cooperative; normal mood and affect  Labs:  Labs Reviewed  NOVEL CORONAVIRUS, NAA (HOSP ORDER, SEND-OUT TO REF LAB; TAT 18-24 HRS)      Allergies  Allergen Reactions  . Shrimp [Shellfish Allergy] Anaphylaxis and Swelling  . Aleve [Naproxen] Hypertension    MD told the patient to not take this because it will elevate her B/P    Past Medical History:  Diagnosis Date  . Anemia    history of blood transfusion-no abnormal  reaction noted  . Asthma    Albuterol as needed  . Breast cancer ([facility]) 11/2012   DCIS-ER+, PR+.Takes Tamoxifen daily  . Diabetes mellitus without complication ([facility])    takes Metformin daily  . History of migraine    several yrs ago  . Hypertension    takes Tribenzor daily   Social History   Socioeconomic History  . Marital status: Single    Spouse name: Not on file  . Number of children: 1  . Years of education: Not on file  . Highest education level: Not on file  Occupational History  . Occupation: [employer]    Employer: [employer]  Tobacco Use  . Smoking status: Never Smoker  . Smokeless tobacco: Never Used  Substance and Sexual Activity  . Alcohol use: Yes    Alcohol/week: 1.0 standard drinks    Types: 1 Cans of beer per week    Comment: occa  . Drug use: No  . Sexual activity: Not Currently    Birth control/protection: None    Comment: last intercourse > 1 year.  Other Topics Concern  . Not on file  Social History Narrative  . Not on file   Social Determinants of Health   Financial Resource Strain:   . Difficulty of Paying Living Expenses: Not on file  Food Insecurity:   . Worried About Charity fundraiser in the Last Year: Not on file  . Ran Out of Food in the Last Year: Not on file  Transportation Needs:   . Lack of Transportation (Medical): Not on file  .  Lack of Transportation (Non-Medical): Not on file  Physical Activity:   . Days of Exercise per Week: Not on file  . Minutes of Exercise per Session: Not on file  Stress:   . Feeling of Stress : Not on file  Social Connections:   . Frequency of Communication with Friends and Family: Not on file  . Frequency of Social Gatherings with Friends and Family: Not on file  . Attends Religious Services: Not on file  . Active Member of Clubs or Organizations: Not on file  . Attends Archivist Meetings: Not on file  . Marital Status: Not on file  Intimate Partner  Violence:   . Fear of Current or Ex-Partner: Not on file  . Emotionally Abused: Not on file  . Physically Abused: Not on file  . Sexually Abused: Not on file   Family History  Problem Relation Age of Onset  . Hypertension Mother   . Diabetes Mother   . Breast cancer Paternal Grandmother        dx in her 29s  . Prostate cancer Paternal Grandfather   . Prostate cancer Father 72  . Prostate cancer Paternal Uncle   . Breast cancer Other        paternal grandmother's sister  . Breast cancer Paternal Aunt   . Anesthesia problems Neg Hx   . Amblyopia Neg Hx   . Blindness Neg Hx   . Cataracts Neg Hx   . Glaucoma Neg Hx   . Macular degeneration Neg Hx   . Retinal detachment Neg Hx   . Strabismus Neg Hx   . Retinitis pigmentosa Neg Hx    Past Surgical History:  Procedure Laterality Date  . BIOPSY BREAST Left 12/05/12   fibroadenoma  . BREAST LUMPECTOMY     2014  . BREAST SURGERY Right   . CESAREAN SECTION  01/05/2009  . CHOLECYSTECTOMY N/A 01/31/2016   Procedure: LAPAROSCOPIC CHOLECYSTECTOMY;  Surgeon: Erroll Luna, MD;  Location: [facility];  Service: General;  Laterality: N/A;  . DILITATION & CURRETTAGE/HYSTROSCOPY WITH HYDROTHERMAL ABLATION N/A 08/12/2013   Procedure: DILATATION & CURETTAGE/HYSTEROSCOPY WITH HYDROTHERMAL ABLATION;  Surgeon: Frederico Hamman, MD;  Location: [facility];  Service: Gynecology;  Laterality: N/A;  . ROBOTIC ASSISTED TOTAL HYSTERECTOMY N/A 06/18/2017   Procedure: XI ROBOTIC ASSISTED TOTAL HYSTERECTOMY;  Surgeon: Lavonia Drafts, MD;  [facility];  Service: Gynecology;  Laterality: N/A;  . SALPINGOOPHORECTOMY Bilateral 06/18/2017   Procedure: BILATERAL SALPINGO OOPHORECTOMY;  Surgeon: Lavonia Drafts, MD;  Location[facility];  Service: Gynecology;  Laterality: Bilateral;     Vanessa Kick, MD 03/23/19 [phone]

## 2019-03-26 ENCOUNTER — Ambulatory Visit (HOSPITAL_COMMUNITY)
Admission: EM | Admit: 2019-03-26 | Discharge: 2019-03-26 | Disposition: A | Discharge status: Home / Self Care | Payer: Medicaid Other | Attending: Physician Assistant | Admitting: Physician Assistant

## 2019-03-26 ENCOUNTER — Encounter (HOSPITAL_COMMUNITY): Payer: Self-pay | Admitting: Emergency Medicine

## 2019-03-26 ENCOUNTER — Other Ambulatory Visit: Payer: Self-pay

## 2019-03-26 DIAGNOSIS — Z20822 Contact with and (suspected) exposure to covid-19: Secondary | ICD-10-CM | POA: Diagnosis present

## 2019-03-26 NOTE — ED Provider Notes (Signed)
[place]    CSN: [id number] Arrival date & time: 03/26/19  0809      History   Chief Complaint Chief Complaint  Patient presents with  . Covid Test    HPI Sheryl Cox is a 49 y.o. female.   Patient reports to urgent care today for  Covid testing. She had a negative test on 1/2 and was tested at that time due to exposure at work. However, yesterday her daughter had sore throat and runny nose, and was brought into urgent care for testing. She reported this to her work and she is now requiring additional testing. Patient continues to deny cough, congestion, shortness of breath, nausea, vomiting, headache, loss or change in smell or taste.   Her job will not allow her to return without a negative test.   She reports a history of chronic bronchitis.      Past Medical History:  Diagnosis Date  . Anemia    history of blood transfusion-no abnormal reaction noted  . Asthma    Albuterol as needed  . 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 Diabetes mellitus without complication ([place])    takes Metformin daily  . History of migraine    several yrs ago  . Hypertension    takes Tribenzor daily    Patient Active Problem List   Diagnosis Date Noted  . Chronic pain of both knees 05/21/2018  . Chronic right shoulder pain 05/21/2018  . BMI 40.0-44.9, adult ([place]) 05/21/2018  . Anemia 06/05/2013  . 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 Breast cancer of upper-outer quadrant of right female breast ([place]) 11/24/2012  . Nabothian cyst 12/07/2011  . Cervical mass 11/20/2011  . Fibroid uterus 11/20/2011    Past Surgical History:  Procedure Laterality Date  . BIOPSY BREAST Left 12/05/12   fibroadenoma  . BREAST LUMPECTOMY     2014  . BREAST SURGERY Right   . CESAREAN SECTION  01/05/2009  . CHOLECYSTECTOMY N/A 01/31/2016   Procedure: LAPAROSCOPIC CHOLECYSTECTOMY;  Surgeon: Erroll Luna, MD;  Location: [facility];  Service: General;   Laterality: N/A;  . DILITATION & CURRETTAGE/HYSTROSCOPY WITH HYDROTHERMAL ABLATION N/A 08/12/2013   Procedure: DILATATION & CURETTAGE/HYSTEROSCOPY WITH HYDROTHERMAL ABLATION;  Surgeon: Frederico Hamman, MD;  Location: [facility];  Service: Gynecology;  Laterality: N/A;  . ROBOTIC ASSISTED TOTAL HYSTERECTOMY N/A 06/18/2017   Procedure: XI ROBOTIC ASSISTED TOTAL HYSTERECTOMY;  Surgeon: Lavonia Drafts, MD;  Location: [facility];  Service: Gynecology;  Laterality: N/A;  . SALPINGOOPHORECTOMY Bilateral 06/18/2017   Procedure: BILATERAL SALPINGO OOPHORECTOMY;  Surgeon: Lavonia Drafts, MD;  Location: [facility];  Service: Gynecology;  Laterality: Bilateral;    OB History    Gravida  1   Para  1   Term  1   Preterm  0   AB  0   Living  1     SAB  0   TAB  0   Ectopic  0   Multiple  0   Live Births  1            Home Medications    Prior to Admission medications   Medication Sig Start Date End Date Taking? Authorizing Provider  albuterol (PROAIR HFA) 108 (90 Base) MCG/ACT inhaler Inhale 2 puffs into the lungs every 6 (six) hours as needed for shortness of breath. 05/12/18  Yes Yu, Amy V, PA-C  ALPRAZolam (XANAX) 0.25 MG tablet TAKE 1 TABLET BY MOUTH TWICE A  DAY AS NEEDED FOR ANXIETY 08/25/18  Yes Lucianne Lei, MD  amLODipine (NORVASC) 10 MG tablet Take 10 mg by mouth daily. 02/23/17  Yes [provider]  atorvastatin (LIPITOR) 10 MG tablet Take 1 tablet (10 mg total) by mouth daily at 6 PM. 05/14/18  Yes Scot Jun, FNP  cetirizine (ZYRTEC) 10 MG tablet Take 1 tablet (10 mg total) by mouth daily. 04/28/18  Yes Scot Jun, FNP  doxycycline (VIBRAMYCIN) 100 MG capsule Take 1 capsule (100 mg total) by mouth 2 (two) times daily. 05/12/18  Yes Yu, Amy V, PA-C  fluticasone (FLONASE) 50 MCG/ACT nasal spray Place 2 sprays into both nostrils daily. 05/12/18  Yes Yu, Amy V, PA-C  hydrochlorothiazide (HYDRODIURIL) 12.5 MG tablet Take 2 tablets (25 mg total) by mouth  daily. Patient taking differently: Take 12.5 mg by mouth daily.  05/14/18  Yes Scot Jun, FNP  ipratropium (ATROVENT) 0.06 % nasal spray Place 2 sprays into both nostrils 4 (four) times daily. 05/12/18  Yes Yu, Amy V, PA-C  meloxicam (MOBIC) 15 MG tablet Take 1 tablet (15 mg total) by mouth daily. 05/14/18  Yes Scot Jun, FNP  metFORMIN (GLUCOPHAGE) 500 MG tablet Take 1 tablet (500 mg total) by mouth 2 (two) times daily with a meal. 06/19/17  Yes Lavonia Drafts, MD  Multiple Vitamins-Minerals (MULTIVITAMIN PO) Take 1 tablet by mouth daily.   Yes [provider]  tamoxifen (NOLVADEX) 20 MG tablet Take 1 tablet (20 mg total) by mouth daily. 09/11/18  Yes Nicholas Lose, MD  acetaminophen (TYLENOL) 325 MG tablet Take 650 mg by mouth daily as needed for headache.    [provider]    Family History Family History  Problem Relation Age of Onset  . Hypertension Mother   . Diabetes Mother   . Breast cancer Paternal Grandmother        dx in her 54s  . Prostate cancer Paternal Grandfather   . Prostate cancer Father 63  . Prostate cancer Paternal Uncle   . Breast cancer Other        paternal grandmother's sister  . Breast cancer Paternal Aunt   . Anesthesia problems Neg Hx   . Amblyopia Neg Hx   . Blindness Neg Hx   . Cataracts Neg Hx   . Glaucoma Neg Hx   . Macular degeneration Neg Hx   . Retinal detachment Neg Hx   . Strabismus Neg Hx   . Retinitis pigmentosa Neg Hx     Social History Social History   Tobacco Use  . Smoking status: Never Smoker  . Smokeless tobacco: Never Used  Substance Use Topics  . Alcohol use: Yes    Alcohol/week: 1.0 standard drinks    Types: 1 Cans of beer per week    Comment: occa  . Drug use: No     Allergies   Shrimp [shellfish allergy] and Aleve [naproxen]   Review of Systems Review of Systems  Constitutional: Negative for activity change, appetite change, chills and fever.  HENT: Negative for  congestion, ear pain, postnasal drip, rhinorrhea, sinus pressure, sinus pain, sneezing and sore throat.   Eyes: Negative for pain and visual disturbance.  Respiratory: Negative for cough and shortness of breath.   Cardiovascular: Negative for chest pain and palpitations.  Gastrointestinal: Negative for abdominal pain, diarrhea, nausea and vomiting.  Genitourinary: Negative for dysuria and hematuria.  Musculoskeletal: Negative for arthralgias and back pain.  Skin: Negative for color change and rash.  Neurological:  Negative for seizures, syncope and headaches.  All other systems reviewed and are negative.    Physical Exam Triage Vital Signs ED Triage Vitals [03/26/19 0852]  Enc Vitals Group     BP (!) 141/94     Pulse Rate 85     Resp 18     Temp 99.3 F (37.4 C)     Temp Source Oral     SpO2 100 %     Weight      Height      Head Circumference      Peak Flow      Pain Score      Pain Loc      Pain Edu?      Excl. in [place]?    No data found.  Updated Vital Signs BP (!) 141/94 (BP Location: Left Arm)   Pulse 85   Temp 99.3 F (37.4 C) (Oral)   Resp 18   LMP 05/27/2017 (Approximate)   SpO2 100%   Visual Acuity Right Eye Distance:   Left Eye Distance:   Bilateral Distance:    Right Eye Near:   Left Eye Near:    Bilateral Near:     Physical Exam Vitals and nursing note reviewed.  Constitutional:      General: She is not in acute distress.    Appearance: Normal appearance. She is well-developed and normal weight. She is not ill-appearing.  HENT:     Head: Normocephalic and atraumatic.     Nose: No congestion.     Mouth/Throat:     Mouth: Mucous membranes are moist.     Pharynx: Oropharynx is clear.  Eyes:     Conjunctiva/sclera: Conjunctivae normal.  Cardiovascular:     Rate and Rhythm: Normal rate and regular rhythm.     Heart sounds: Normal heart sounds. No murmur.  Pulmonary:     Effort: Pulmonary effort is normal. No respiratory distress.     Breath  sounds: Normal breath sounds. No wheezing, rhonchi or rales.  Abdominal:     Palpations: Abdomen is soft.     Tenderness: There is no abdominal tenderness.  Musculoskeletal:     Cervical back: Neck supple.     Right lower leg: No edema.     Left lower leg: No edema.  Skin:    General: Skin is warm and dry.  Neurological:     General: No focal deficit present.     Mental Status: She is alert and oriented to person, place, and time.  Psychiatric:        Mood and Affect: Mood normal.        Behavior: Behavior normal.        Thought Content: Thought content normal.        Judgment: Judgment normal.      UC Treatments / Results  Labs (all labs ordered are listed, but only abnormal results are displayed) Labs Reviewed  NOVEL CORONAVIRUS, NAA (HOSP ORDER, SEND-OUT TO REF LAB; TAT 18-24 HRS)    EKG   Radiology No results found.  Procedures Procedures (including critical care time)  Medications Ordered in UC Medications - No data to display  Initial Impression / Assessment and Plan / UC Course  I have reviewed the triage vital signs and the nursing notes.  Pertinent labs & imaging results that were available during my care of the patient were reviewed by me and considered in my medical decision making (see chart for details).     #Covid Testing -  though policy is to not re-test within 14 days, due to patient requiring testing for return to work, COVID PCR was sent. Asymptomatic.Precautions given.   Final Clinical Impressions(s) / UC Diagnoses   Final diagnoses:  Encounter for laboratory testing for COVID-19 virus     Discharge Instructions     If your Covid-19 test is positive, you will receive a phone call from [facility] regarding your results. Negative test results are not called. Both positive and negative results area always visible on MyChart. If you do not have a MyChart account, sign up instructions are in your discharge papers.   Persons who are  directed to care for themselves at home may discontinue isolation under the following conditions:  . At least 10 days have passed since symptom onset and . At least 24 hours have passed without running a fever (this means without the use of fever-reducing medications) and . Other symptoms have improved.  Persons infected with COVID-19 who never develop symptoms may discontinue isolation and other precautions 10 days after the date of their first positive COVID-19 test.     ED Prescriptions    None     PDMP not reviewed this encounter.   Purnell Shoemaker, PA-C 03/26/19 1028

## 2019-03-26 NOTE — Discharge Instructions (Signed)
If your Covid-19 test is positive, you will receive a phone call from [facility] regarding your results. Negative test results are not called. Both positive and negative results area always visible on MyChart. If you do not have a MyChart account, sign up instructions are in your discharge papers.   Persons who are directed to care for themselves at home may discontinue isolation under the following conditions:   At least 10 days have passed since symptom onset and  At least 24 hours have passed without running a fever (this means without the use of fever-reducing medications) and  Other symptoms have improved.  Persons infected with COVID-19 who never develop symptoms may discontinue isolation and other precautions 10 days after the date of their first positive COVID-19 test.  

## 2019-03-26 NOTE — ED Triage Notes (Signed)
Pt is here for another Covid test.  Her job is requiring her to get another one because she brought her daughter in yesterday for another test.

## 2019-03-28 LAB — NOVEL CORONAVIRUS, NAA (HOSP ORDER, SEND-OUT TO REF LAB; TAT 18-24 HRS): SARS-CoV-2, NAA: NOT DETECTED

## 2019-04-04 ENCOUNTER — Ambulatory Visit (HOSPITAL_COMMUNITY)
Admission: EM | Admit: 2019-04-04 | Discharge: 2019-04-04 | Disposition: A | Discharge status: Home / Self Care | Payer: Medicaid Other | Attending: Family Medicine | Admitting: Family Medicine

## 2019-04-04 ENCOUNTER — Other Ambulatory Visit: Payer: Self-pay

## 2019-04-04 ENCOUNTER — Encounter (HOSPITAL_COMMUNITY): Payer: Self-pay

## 2019-04-04 DIAGNOSIS — B9689 Other specified bacterial agents as the cause of diseases classified elsewhere: Secondary | ICD-10-CM

## 2019-04-04 DIAGNOSIS — L089 Local infection of the skin and subcutaneous tissue, unspecified: Secondary | ICD-10-CM | POA: Diagnosis not present

## 2019-04-04 MED ORDER — DOXYCYCLINE HYCLATE 100 MG PO CAPS: 100.0000 mg | ORAL_CAPSULE | Freq: Two times a day (BID) | ORAL | 0 refills | Status: DC

## 2019-04-04 NOTE — ED Triage Notes (Signed)
Pt states she has a abscess on her abdominal area.  Pt states she thinks it may have burst today it has been draining blood a little bit.

## 2019-04-06 NOTE — ED Provider Notes (Signed)
[place]   [id number] 04/04/19 Arrival Time: [id number]  ASSESSMENT & PLAN:  1. Bacterial skin infection     No sign of abscess requiring I&D at this time. Discussed.  Begin: Meds ordered this encounter  Medications  . doxycycline (VIBRAMYCIN) 100 MG capsule    Sig: Take 1 capsule (100 mg total) by mouth 2 (two) times daily.    Dispense:  14 capsule    Refill:  0    Will follow up with PCP or here if worsening or failing to improve as anticipated. Reviewed expectations re: course of current medical issues. Questions answered. Outlined signs and symptoms indicating need for more acute intervention. Patient verbalized understanding. After Visit Summary given.   SUBJECTIVE:  Sheryl Cox is a 49 y.o. female who presents with a skin complaint. Noticed "a bump" of lower abdomen yesterday. Mild to moderate pain. Afebrile. Reports area draining "some pus and a little blood" today. Here for evaluation. No injury to area. No specific abdominal or back pain. Normal bowel/bladder habits. No OTC tx. No h/o recurrent skin infections.  ROS: As per HPI.  OBJECTIVE: Vitals:   04/04/19 1637 04/04/19 1639  BP:  127/87  Pulse:  (!) 104  Resp:  16  Temp:  98.5 F (36.9 C)  TempSrc:  Oral  SpO2:  100%  Weight: 108.9 kg     General appearance: alert; no distress HEENT: [place]; AT Extremities: no edema; moves all extremities normally Skin: warm and dry; lower midline abdomen is a small area of inflammation/erythema around hair follicle under pannus; area is soft without fluctuance; no active drainage or bleeding Psychological: alert and cooperative; normal mood and affect   Allergies  Allergen Reactions  . Shrimp [Shellfish Allergy] Anaphylaxis and Swelling  . Aleve [Naproxen] Hypertension    MD told the patient to not take this because it will elevate her B/P    Past Medical History:  Diagnosis Date  . Anemia    history of blood transfusion-no abnormal reaction noted   . Asthma    Albuterol as needed  . 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 Diabetes mellitus without complication ([place])    takes Metformin daily  . History of migraine    several yrs ago  . Hypertension    takes Tribenzor daily   Social History   Socioeconomic History  . Marital status: Single    Spouse name: Not on file  . Number of children: 1  . Years of education: Not on file  . Highest education level: Not on file  Occupational History  . Occupation: [employer]    Employer: [employer]  Tobacco Use  . Smoking status: Never Smoker  . Smokeless tobacco: Never Used  Substance and Sexual Activity  . Alcohol use: Yes    Alcohol/week: 1.0 standard drinks    Types: 1 Cans of beer per week    Comment: occa  . Drug use: No  . Sexual activity: Not Currently    Birth control/protection: None    Comment: last intercourse > 1 year.  Other Topics Concern  . Not on file  Social History Narrative  . Not on file   Social Determinants of Health   Financial Resource Strain:   . Difficulty of Paying Living Expenses: Not on file  Food Insecurity:   . Worried About Charity fundraiser in the Last Year: Not on file  . Ran Out of Food in the Last  Year: Not on file  Transportation Needs:   . Lack of Transportation (Medical): Not on file  . Lack of Transportation (Non-Medical): Not on file  Physical Activity:   . Days of Exercise per Week: Not on file  . Minutes of Exercise per Session: Not on file  Stress:   . Feeling of Stress : Not on file  Social Connections:   . Frequency of Communication with Friends and Family: Not on file  . Frequency of Social Gatherings with Friends and Family: Not on file  . Attends Religious Services: Not on file  . Active Member of Clubs or Organizations: Not on file  . Attends Archivist Meetings: Not on file  . Marital Status: Not on file  Intimate Partner Violence:   . Fear of  Current or Ex-Partner: Not on file  . Emotionally Abused: Not on file  . Physically Abused: Not on file  . Sexually Abused: Not on file   Family History  Problem Relation Age of Onset  . Hypertension Mother   . Diabetes Mother   . Breast cancer Paternal Grandmother        dx in her 31s  . Prostate cancer Paternal Grandfather   . Prostate cancer Father 41  . Prostate cancer Paternal Uncle   . Breast cancer Other        paternal grandmother's sister  . Breast cancer Paternal Aunt   . Anesthesia problems Neg Hx   . Amblyopia Neg Hx   . Blindness Neg Hx   . Cataracts Neg Hx   . Glaucoma Neg Hx   . Macular degeneration Neg Hx   . Retinal detachment Neg Hx   . Strabismus Neg Hx   . Retinitis pigmentosa Neg Hx    Past Surgical History:  Procedure Laterality Date  . BIOPSY BREAST Left 12/05/12   fibroadenoma  . BREAST LUMPECTOMY     2014  . BREAST SURGERY Right   . CESAREAN SECTION  01/05/2009  . CHOLECYSTECTOMY N/A 01/31/2016   Procedure: LAPAROSCOPIC CHOLECYSTECTOMY;  Surgeon: Erroll Luna, MD;  Location: [facility];  Service: General;  Laterality: N/A;  . DILITATION & CURRETTAGE/HYSTROSCOPY WITH HYDROTHERMAL ABLATION N/A 08/12/2013   Procedure: DILATATION & CURETTAGE/HYSTEROSCOPY WITH HYDROTHERMAL ABLATION;  Surgeon: Frederico Hamman, MD;  Location: [facility];  Service: Gynecology;  Laterality: N/A;  . ROBOTIC ASSISTED TOTAL HYSTERECTOMY N/A 06/18/2017   Procedure: XI ROBOTIC ASSISTED TOTAL HYSTERECTOMY;  Surgeon: Lavonia Drafts, MD;  Location: [facility];  Service: Gynecology;  Laterality: N/A;  . SALPINGOOPHORECTOMY Bilateral 06/18/2017   Procedure: BILATERAL SALPINGO OOPHORECTOMY;  Surgeon: Lavonia Drafts, MD;  Location: [facility];  Service: Gynecology;  Laterality: Bilateral;     Vanessa Kick, MD 04/06/19 [phone]

## 2019-04-16 ENCOUNTER — Other Ambulatory Visit: Payer: Self-pay

## 2019-04-16 ENCOUNTER — Emergency Department (HOSPITAL_COMMUNITY): Payer: Medicaid Other

## 2019-04-16 ENCOUNTER — Emergency Department (HOSPITAL_COMMUNITY)
Admission: EM | Admit: 2019-04-16 | Discharge: 2019-04-16 | Disposition: A | Discharge status: Home / Self Care | Payer: Medicaid Other | Attending: Emergency Medicine | Admitting: Emergency Medicine

## 2019-04-16 ENCOUNTER — Encounter (HOSPITAL_COMMUNITY): Payer: Self-pay | Admitting: Emergency Medicine

## 2019-04-16 ENCOUNTER — Encounter (HOSPITAL_COMMUNITY): Payer: Self-pay

## 2019-04-16 ENCOUNTER — Ambulatory Visit (HOSPITAL_COMMUNITY)
Admission: EM | Admit: 2019-04-16 | Discharge: 2019-04-16 | Disposition: A | Discharge status: Home / Self Care | Payer: Medicaid Other | Attending: Family Medicine | Admitting: Family Medicine

## 2019-04-16 ENCOUNTER — Telehealth (HOSPITAL_COMMUNITY): Payer: Self-pay | Admitting: Emergency Medicine

## 2019-04-16 DIAGNOSIS — Z791 Long term (current) use of non-steroidal anti-inflammatories (NSAID): Secondary | ICD-10-CM | POA: Insufficient documentation

## 2019-04-16 DIAGNOSIS — R000 Tachycardia, unspecified: Secondary | ICD-10-CM | POA: Diagnosis present

## 2019-04-16 DIAGNOSIS — M25561 Pain in right knee: Secondary | ICD-10-CM | POA: Insufficient documentation

## 2019-04-16 DIAGNOSIS — Z6841 Body Mass Index (BMI) 40.0 and over, adult: Secondary | ICD-10-CM | POA: Insufficient documentation

## 2019-04-16 DIAGNOSIS — M25511 Pain in right shoulder: Secondary | ICD-10-CM | POA: Insufficient documentation

## 2019-04-16 DIAGNOSIS — E119 Type 2 diabetes mellitus without complications: Secondary | ICD-10-CM | POA: Diagnosis not present

## 2019-04-16 DIAGNOSIS — I10 Essential (primary) hypertension: Secondary | ICD-10-CM | POA: Diagnosis not present

## 2019-04-16 DIAGNOSIS — E669 Obesity, unspecified: Secondary | ICD-10-CM | POA: Diagnosis not present

## 2019-04-16 DIAGNOSIS — Z7984 Long term (current) use of oral hypoglycemic drugs: Secondary | ICD-10-CM | POA: Diagnosis not present

## 2019-04-16 DIAGNOSIS — Z79899 Other long term (current) drug therapy: Secondary | ICD-10-CM | POA: Insufficient documentation

## 2019-04-16 DIAGNOSIS — M25562 Pain in left knee: Secondary | ICD-10-CM | POA: Insufficient documentation

## 2019-04-16 DIAGNOSIS — R002 Palpitations: Secondary | ICD-10-CM

## 2019-04-16 DIAGNOSIS — Z853 Personal history of malignant neoplasm of breast: Secondary | ICD-10-CM | POA: Diagnosis not present

## 2019-04-16 DIAGNOSIS — J45909 Unspecified asthma, uncomplicated: Secondary | ICD-10-CM | POA: Diagnosis not present

## 2019-04-16 DIAGNOSIS — F411 Generalized anxiety disorder: Secondary | ICD-10-CM | POA: Diagnosis not present

## 2019-04-16 DIAGNOSIS — Z20822 Contact with and (suspected) exposure to covid-19: Secondary | ICD-10-CM | POA: Diagnosis not present

## 2019-04-16 DIAGNOSIS — R0602 Shortness of breath: Secondary | ICD-10-CM | POA: Diagnosis not present

## 2019-04-16 DIAGNOSIS — D649 Anemia, unspecified: Secondary | ICD-10-CM | POA: Insufficient documentation

## 2019-04-16 DIAGNOSIS — G8929 Other chronic pain: Secondary | ICD-10-CM | POA: Insufficient documentation

## 2019-04-16 DIAGNOSIS — Z8249 Family history of ischemic heart disease and other diseases of the circulatory system: Secondary | ICD-10-CM | POA: Diagnosis not present

## 2019-04-16 LAB — BASIC METABOLIC PANEL
Anion gap: 10 (ref 5–15)
BUN: 12 mg/dL (ref 6–20)
CO2: 27 mmol/L (ref 22–32)
Calcium: 9.3 mg/dL (ref 8.9–10.3)
Chloride: 102 mmol/L (ref 98–111)
Creatinine, Ser: 0.72 mg/dL (ref 0.44–1.00)
GFR calc Af Amer: >60 mL/min (ref >60)
GFR calc non Af Amer: >60 mL/min (ref >60)
Glucose, Bld: 105 mg/dL — ABNORMAL HIGH (ref 70–99)
Potassium: 3.6 mmol/L (ref 3.5–5.1)
Sodium: 139 mmol/L (ref 135–145)

## 2019-04-16 LAB — D-DIMER, QUANTITATIVE: D-Dimer, Quant: 0.28 ug/mL-FEU (ref 0.00–0.50)

## 2019-04-16 LAB — TROPONIN I (HIGH SENSITIVITY)
Troponin I (High Sensitivity): <2 ng/L (ref <18)
Troponin I (High Sensitivity): 3 ng/L (ref <18)

## 2019-04-16 LAB — CBC
HCT: 36.8 % (ref 36.0–46.0)
Hemoglobin: 11.6 g/dL — ABNORMAL LOW (ref 12.0–15.0)
MCH: 24.7 pg — ABNORMAL LOW (ref 26.0–34.0)
MCHC: 31.5 g/dL (ref 30.0–36.0)
MCV: 78.5 fL — ABNORMAL LOW (ref 80.0–100.0)
Platelets: 309 10*3/uL (ref 150–400)
RBC: 4.69 MIL/uL (ref 3.87–5.11)
RDW: 15.2 % (ref 11.5–15.5)
WBC: 5 10*3/uL (ref 4.0–10.5)
nRBC: 0 % (ref 0.0–0.2)

## 2019-04-16 LAB — POC SARS CORONAVIRUS 2 AG -  ED: SARS Coronavirus 2 Ag: NEGATIVE

## 2019-04-16 LAB — POC SARS CORONAVIRUS 2 AG: SARS Coronavirus 2 Ag: NEGATIVE

## 2019-04-16 MED ORDER — SODIUM CHLORIDE 0.9 % IV BOLUS: 500.0000 mL | Freq: Once | INTRAVENOUS | Status: DC

## 2019-04-16 MED ORDER — SODIUM CHLORIDE 0.9% FLUSH: 3.0000 mL | Freq: Once | INTRAVENOUS | Status: DC

## 2019-04-16 NOTE — ED Notes (Signed)
Pt to xray

## 2019-04-16 NOTE — Telephone Encounter (Signed)
Patient returned to [facility].  Patient reports palpitations are getting worse.  Discharge instructions state to go to ED if symptoms (including palpitations) worsen.  Spoke to dr Meda Coffee in regards to patient .  Dr Meda Coffee reports patient to go to ed as instructed.  Patient agreeable

## 2019-04-16 NOTE — Discharge Instructions (Signed)
As we discussed today, your work-up today was reassuring.  At this time, I do not see any obvious cause of your symptoms.  As we discussed, you need further evaluation by cardiologist possible at home monitoring to catch the sensation.  I provided you an outpatient referral to cardiology.  If you have not heard from them in 24 to 48 hours, contact them to arrange for an appointment.  Return the emergency department for any worsening sensation, chest pain, difficulty breathing or any other worsening or concerning symptoms.

## 2019-04-16 NOTE — Discharge Instructions (Signed)
Contact your PCP about additional testing Avoid caffeine Drink plenty of fluids Go to ER if you have chest pain or worsening rapid heartbeat with dizziness that does not go away Quarantine until you get your COVID test result

## 2019-04-16 NOTE — ED Provider Notes (Signed)
[facility] EMERGENCY DEPARTMENT Provider Note   CSN: [id number] Arrival date & time: 04/16/19  1727     History Chief Complaint  Patient presents with  . Palpitations    Sheryl Cox is a 49 y.o. female past medical history of asthma, history of remote breast cancer who is currently on tamoxifen who presents for evaluation of palpitations that have been ongoing for the last couple days.  She states that over the last 4 to 5 days, she has had intermittent episodes of palpitations.  She states she has had this both at rest and with exertion but does feel like it is slightly worse with exertion.  She has noticed it more when she is working and doing a lot of things.  She states that when she has them, she has shortness of breath.  No associated chest pain or pleuritic chest pain.  No associated nausea, vomiting, diaphoresis.  She states that she went to urgent care today and they did not determine the cause of her symptoms.  She went home and was still having the symptoms and felt like they were becoming more frequent prompting ED visit.  She states she does not smoke and she denies any illicit drug use.  No new medications.  No history of stimulants or energy drinks.  She does report she is on tamoxifen.  She has not had any recent travel, surgeries, hospitalizations, leg swelling, history of blood clots.  She does have history of high blood pressure, diabetes.  No personal cardiac history or family cardiac history.  The history is provided by the patient.       Past Medical History:  Diagnosis Date  . Anemia    history of blood transfusion-no abnormal reaction noted  . Asthma    Albuterol as needed  . 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 Diabetes mellitus without complication ([place])    takes Metformin daily  . History of migraine    several yrs ago  . Hypertension    takes Tribenzor daily    Patient Active Problem List   Diagnosis Date Noted  . Chronic pain of both knees 05/21/2018  . Chronic right shoulder pain 05/21/2018  . BMI 40.0-44.9, adult ([place]) 05/21/2018  . Anemia 06/05/2013  . 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 Breast cancer of upper-outer quadrant of right female breast ([place]) 11/24/2012  . Nabothian cyst 12/07/2011  . Cervical mass 11/20/2011  . Fibroid uterus 11/20/2011    Past Surgical History:  Procedure Laterality Date  . BIOPSY BREAST Left 12/05/12   fibroadenoma  . BREAST LUMPECTOMY     2014  . BREAST SURGERY Right   . CESAREAN SECTION  01/05/2009  . CHOLECYSTECTOMY N/A 01/31/2016   Procedure: LAPAROSCOPIC CHOLECYSTECTOMY;  Surgeon: Erroll Luna, MD;  Location: [facility];  Service: General;  Laterality: N/A;  . DILITATION & CURRETTAGE/HYSTROSCOPY WITH HYDROTHERMAL ABLATION N/A 08/12/2013   Procedure: DILATATION & CURETTAGE/HYSTEROSCOPY WITH HYDROTHERMAL ABLATION;  Surgeon: Frederico Hamman, MD;  Location: [facility];  Service: Gynecology;  Laterality: N/A;  . ROBOTIC ASSISTED TOTAL HYSTERECTOMY N/A 06/18/2017   Procedure: XI ROBOTIC ASSISTED TOTAL HYSTERECTOMY;  Surgeon: Lavonia Drafts, MD;  Location: [facility];  Service: Gynecology;  Laterality: N/A;  . SALPINGOOPHORECTOMY Bilateral 06/18/2017   Procedure: BILATERAL SALPINGO OOPHORECTOMY;  Surgeon: Lavonia Drafts, MD;  Location: [facility];  Service: Gynecology;  Laterality: Bilateral;     OB History    Gravida  1  Para  1   Term  1   Preterm  0   AB  0   Living  1     SAB  0   TAB  0   Ectopic  0   Multiple  0   Live Births  1           Family History  Problem Relation Age of Onset  . Hypertension Mother   . Diabetes Mother   . Breast cancer Paternal Grandmother        dx in her 32s  . Prostate cancer Paternal Grandfather   . Prostate cancer Father 11  . Prostate cancer Paternal Uncle   . Breast cancer Other        paternal grandmother's sister  . Breast cancer Paternal Aunt   .  Anesthesia problems Neg Hx   . Amblyopia Neg Hx   . Blindness Neg Hx   . Cataracts Neg Hx   . Glaucoma Neg Hx   . Macular degeneration Neg Hx   . Retinal detachment Neg Hx   . Strabismus Neg Hx   . Retinitis pigmentosa Neg Hx     Social History   Tobacco Use  . Smoking status: Never Smoker  . Smokeless tobacco: Never Used  Substance Use Topics  . Alcohol use: Yes    Alcohol/week: 1.0 standard drinks    Types: 1 Cans of beer per week    Comment: occa  . Drug use: No    Home Medications Prior to Admission medications   Medication Sig Start Date End Date Taking? Authorizing Provider  acetaminophen (TYLENOL) 325 MG tablet Take 650 mg by mouth daily as needed for headache.    [provider]  albuterol (PROAIR HFA) 108 (90 Base) MCG/ACT inhaler Inhale 2 puffs into the lungs every 6 (six) hours as needed for shortness of breath. 05/12/18   Tasia Catchings, Amy V, PA-C  ALPRAZolam (XANAX) 0.25 MG tablet TAKE 1 TABLET BY MOUTH TWICE A DAY AS NEEDED FOR ANXIETY 08/25/18   Lucianne Lei, MD  amLODipine (NORVASC) 10 MG tablet Take 10 mg by mouth daily. 02/23/17   [provider]  atorvastatin (LIPITOR) 10 MG tablet Take 1 tablet (10 mg total) by mouth daily at 6 PM. 05/14/18   Scot Jun, FNP  cetirizine (ZYRTEC) 10 MG tablet Take 1 tablet (10 mg total) by mouth daily. 04/28/18   Scot Jun, FNP  doxycycline (VIBRAMYCIN) 100 MG capsule Take 1 capsule (100 mg total) by mouth 2 (two) times daily. 04/04/19   Vanessa Kick, MD  fluticasone (FLONASE) 50 MCG/ACT nasal spray Place 2 sprays into both nostrils daily. 05/12/18   Tasia Catchings, Amy V, PA-C  hydrochlorothiazide (HYDRODIURIL) 12.5 MG tablet Take 2 tablets (25 mg total) by mouth daily. Patient taking differently: Take 12.5 mg by mouth daily.  05/14/18   Scot Jun, FNP  ipratropium (ATROVENT) 0.06 % nasal spray Place 2 sprays into both nostrils 4 (four) times daily. 05/12/18   Ok Edwards, PA-C  meloxicam (MOBIC) 15 MG tablet Take  1 tablet (15 mg total) by mouth daily. 05/14/18   Scot Jun, FNP  metFORMIN (GLUCOPHAGE) 500 MG tablet Take 1 tablet (500 mg total) by mouth 2 (two) times daily with a meal. 06/19/17   Lavonia Drafts, MD  Multiple Vitamins-Minerals (MULTIVITAMIN PO) Take 1 tablet by mouth daily.    [provider]  tamoxifen (NOLVADEX) 20 MG tablet Take 1 tablet (20 mg total) by mouth daily.  09/11/18   Nicholas Lose, MD    Allergies    Shrimp [shellfish allergy] and Aleve [naproxen]  Review of Systems   Review of Systems  Constitutional: Negative for fever.  Respiratory: Positive for shortness of breath. Negative for cough.   Cardiovascular: Positive for palpitations. Negative for chest pain and leg swelling.  Gastrointestinal: Negative for abdominal pain, nausea and vomiting.  Genitourinary: Negative for dysuria and hematuria.  Neurological: Negative for headaches.  All other systems reviewed and are negative.   Physical Exam Updated Vital Signs BP 123/78 (BP Location: Left Arm)   Pulse 90   Temp 98.7 F (37.1 C) (Oral)   Resp 18   LMP 05/27/2017 (Approximate)   SpO2 100%   Physical Exam Vitals and nursing note reviewed.  Constitutional:      Appearance: Normal appearance. She is well-developed.  HENT:     Head: Normocephalic and atraumatic.  Eyes:     General: Lids are normal.     Conjunctiva/sclera: Conjunctivae normal.     Pupils: Pupils are equal, round, and reactive to light.  Cardiovascular:     Rate and Rhythm: Regular rhythm. Tachycardia present.     Pulses: Normal pulses.     Heart sounds: Normal heart sounds. No murmur. No friction rub. No gallop.   Pulmonary:     Effort: Pulmonary effort is normal.     Breath sounds: Normal breath sounds.     Comments: Lungs clear to auscultation bilaterally.  Symmetric chest rise.  No wheezing, rales, rhonchi. Abdominal:     Palpations: Abdomen is soft. Abdomen is not rigid.     Tenderness: There is no abdominal  tenderness. There is no guarding.  Musculoskeletal:        General: Normal range of motion.     Cervical back: Full passive range of motion without pain.     Comments: Bilateral lower extremities are symmetric in appearance without any overlying warmth, erythema.  Skin:    General: Skin is warm and dry.     Capillary Refill: Capillary refill takes less than 2 seconds.  Neurological:     Mental Status: She is alert and oriented to person, place, and time.  Psychiatric:        Speech: Speech normal.     ED Results / Procedures / Treatments   Labs (all labs ordered are listed, but only abnormal results are displayed) Labs Reviewed  BASIC METABOLIC PANEL - Abnormal; Notable for the following components:      Result Value   Glucose, Bld 105 (*)    All other components within normal limits  CBC - Abnormal; Notable for the following components:   Hemoglobin 11.6 (*)    MCV 78.5 (*)    MCH 24.7 (*)    All other components within normal limits  D-DIMER, QUANTITATIVE (NOT AT [facility])  TROPONIN I (HIGH SENSITIVITY)  TROPONIN I (HIGH SENSITIVITY)    EKG None  Radiology DG Chest 2 View  Result Date: 04/16/2019 CLINICAL DATA:  Palpitations EXAM: CHEST - 2 VIEW COMPARISON:  12/14/2016 FINDINGS: Heart and mediastinal contours are within normal limits. No focal opacities or effusions. No acute bony abnormality. IMPRESSION: No active cardiopulmonary disease. Electronically Signed   By: Rolm Baptise M.D.   On: 04/16/2019 19:17    Procedures Procedures (including critical care time)  Medications Ordered in ED Medications  sodium chloride flush (NS) 0.9 % injection 3 mL (has no administration in time range)  sodium chloride 0.9 % bolus 500 mL (  has no administration in time range)    ED Course  I have reviewed the triage vital signs and the nursing notes.  Pertinent labs & imaging results that were available during my care of the patient were reviewed by me and considered in my medical  decision making (see chart for details).    MDM Rules/Calculators/A&P                      49 year old female who presents for evaluation of palpitations x4 to 5 days.  Went to urgent care today and did not determine cause of her symptoms.  Became worse at home prompting ED visit.  She is on tamoxifen for breast cancer.  Her breast cancer has been in remission for several years.  On initial arrival, she is afebrile, nontoxic-appearing.  She is slightly tachycardic on my exam, ranging between 103-106.  Vitals otherwise unremarkable.  Exam unremarkable.  Concern for arrhythmia versus electrolyte imbalance.  Low suspicion for ACS etiology.  Also consider PE.  Unfortunately, she cannot be perked out given that she is tachycardic and she is on tamoxifen.  We will plan for D-dimer.  CBC shows no leukocytosis.  Hemoglobin stable 11.6.  Dimer is negative.  Her initial troponin is negative.  With a negative D-dimer and the fact that patient is not hypoxic or tachycardic, I feel that this is sufficient for rule out for PE.  Chest x-ray negative for any infectious etiology.  No acute abnormality.  EKG shows sinus rhythm, heart rate of 69, PR 176, 18 QT 352.  No evidence of ST elevation.  Troponin is negative.  I discussed results with patient.  I offered IV fluids here in the ED patient declined IV.  At this time, patient is hemodynamically stable.  I discussed with her that at this time, her work-up today is reassuring.  Discussed with her that if there could still be an arrhythmia that is causing an underlying palpitation feeling.  At this time, I feel that she needs outpatient cardiology follow-up get home monitored for continued evaluation of her sensation. At this time, patient exhibits no emergent life-threatening condition that require further evaluation in ED or admission. Patient had ample opportunity for questions and discussion. All patient's questions were answered with full understanding. Strict  return precautions discussed. Patient expresses understanding and agreement to plan.   Portions of this note were generated with [employer]. Dictation errors may occur despite best attempts at proofreading.   Final Clinical Impression(s) / ED Diagnoses Final diagnoses:  Palpitations    Rx / DC Orders ED Discharge Orders    None       Desma Mcgregor 04/16/19 2315    Little, Wenda Overland, MD 04/17/19 [phone]

## 2019-04-16 NOTE — ED Notes (Signed)
Attempted IV start without success on L hand. Pt expressed that she did not want an IV and would rather hydrate at home. PA notified.

## 2019-04-16 NOTE — ED Provider Notes (Signed)
[facility]    CSN: [id number] Arrival date & time: 04/16/19  [id number]      History   Chief Complaint Chief Complaint  Patient presents with  . Palpitations    HPI Sheryl Cox is a 49 y.o. female.   HPI  Patient states that she has been having spells of rapid heartbeat for the last 3 or 4 days.  She states that when she gets these spells she feels mildly short of breath, moderately anxious.  Feels discomfort under her right breast radiating to her back.  No abdominal pain.  No nausea vomiting.  No history of heart disease.  No history of palpitations.  She has concerned because her mother had congestive heart failure.  She states that on she gets these spells she sometimes gets some blurry vision.  No loss of strength, no numbness, no slurred speech or difficulty with cognition.  No syncope Patient states that she had diarrhea 2 days ago but this has gone away.  She has a decreased appetite.  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  Daughter is without symptoms. She denies fever or chills.  Shortness of breath or cough or chest pain.  No loss of taste or smell.  Past Medical History:  Diagnosis Date  . Anemia    history of blood transfusion-no abnormal reaction noted  . Asthma    Albuterol as needed  . 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 Diabetes mellitus without complication ([place])    takes Metformin daily  . History of migraine    several yrs ago  . Hypertension    takes Tribenzor daily    Patient Active Problem List   Diagnosis Date Noted  . Chronic pain of both knees 05/21/2018  . Chronic right shoulder pain 05/21/2018  . BMI 40.0-44.9, adult ([place]) 05/21/2018  . Anemia 06/05/2013  . 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 Breast cancer of upper-outer quadrant of right female breast ([place]) 11/24/2012  . Nabothian cyst 12/07/2011  . Cervical mass 11/20/2011  . Fibroid uterus  11/20/2011    Past Surgical History:  Procedure Laterality Date  . BIOPSY BREAST Left 12/05/12   fibroadenoma  . BREAST LUMPECTOMY     2014  . BREAST SURGERY Right   . CESAREAN SECTION  01/05/2009  . CHOLECYSTECTOMY N/A 01/31/2016   Procedure: LAPAROSCOPIC CHOLECYSTECTOMY;  Surgeon: Erroll Luna, MD;  Location: [facility];  Service: General;  Laterality: N/A;  . DILITATION & CURRETTAGE/HYSTROSCOPY WITH HYDROTHERMAL ABLATION N/A 08/12/2013   Procedure: DILATATION & CURETTAGE/HYSTEROSCOPY WITH HYDROTHERMAL ABLATION;  Surgeon: Frederico Hamman, MD;  Location: [facility];  Service: Gynecology;  Laterality: N/A;  . ROBOTIC ASSISTED TOTAL HYSTERECTOMY N/A 06/18/2017   Procedure: XI ROBOTIC ASSISTED TOTAL HYSTERECTOMY;  Surgeon: Lavonia Drafts, MD;  Location: [facility];  Service: Gynecology;  Laterality: N/A;  . SALPINGOOPHORECTOMY Bilateral 06/18/2017   Procedure: BILATERAL SALPINGO OOPHORECTOMY;  Surgeon: Lavonia Drafts, MD;  Location: [facility];  Service: Gynecology;  Laterality: Bilateral;    OB History    Gravida  1   Para  1   Term  1   Preterm  0   AB  0   Living  1     SAB  0   TAB  0   Ectopic  0   Multiple  0   Live Births  1  Home Medications    Prior to Admission medications   Medication Sig Start Date End Date Taking? Authorizing Provider  amLODipine (NORVASC) 10 MG tablet Take 10 mg by mouth daily. 02/23/17  Yes [provider]  hydrochlorothiazide (HYDRODIURIL) 12.5 MG tablet Take 2 tablets (25 mg total) by mouth daily. Patient taking differently: Take 12.5 mg by mouth daily.  05/14/18  Yes Scot Jun, FNP  metFORMIN (GLUCOPHAGE) 500 MG tablet Take 1 tablet (500 mg total) by mouth 2 (two) times daily with a meal. 06/19/17  Yes Lavonia Drafts, MD  acetaminophen (TYLENOL) 325 MG tablet Take 650 mg by mouth daily as needed for headache.    [provider]  albuterol (PROAIR HFA) 108 (90 Base) MCG/ACT inhaler  Inhale 2 puffs into the lungs every 6 (six) hours as needed for shortness of breath. 05/12/18   Tasia Catchings, Amy V, PA-C  ALPRAZolam (XANAX) 0.25 MG tablet TAKE 1 TABLET BY MOUTH TWICE A DAY AS NEEDED FOR ANXIETY 08/25/18   Lucianne Lei, MD  atorvastatin (LIPITOR) 10 MG tablet Take 1 tablet (10 mg total) by mouth daily at 6 PM. 05/14/18   Scot Jun, FNP  cetirizine (ZYRTEC) 10 MG tablet Take 1 tablet (10 mg total) by mouth daily. 04/28/18   Scot Jun, FNP  doxycycline (VIBRAMYCIN) 100 MG capsule Take 1 capsule (100 mg total) by mouth 2 (two) times daily. 04/04/19   Vanessa Kick, MD  fluticasone (FLONASE) 50 MCG/ACT nasal spray Place 2 sprays into both nostrils daily. 05/12/18   Tasia Catchings, Amy V, PA-C  ipratropium (ATROVENT) 0.06 % nasal spray Place 2 sprays into both nostrils 4 (four) times daily. 05/12/18   Ok Edwards, PA-C  meloxicam (MOBIC) 15 MG tablet Take 1 tablet (15 mg total) by mouth daily. 05/14/18   Scot Jun, FNP  Multiple Vitamins-Minerals (MULTIVITAMIN PO) Take 1 tablet by mouth daily.    [provider]  tamoxifen (NOLVADEX) 20 MG tablet Take 1 tablet (20 mg total) by mouth daily. 09/11/18   Nicholas Lose, MD    Family History Family History  Problem Relation Age of Onset  . Hypertension Mother   . Diabetes Mother   . Breast cancer Paternal Grandmother        dx in her 23s  . Prostate cancer Paternal Grandfather   . Prostate cancer Father 23  . Prostate cancer Paternal Uncle   . Breast cancer Other        paternal grandmother's sister  . Breast cancer Paternal Aunt   . Anesthesia problems Neg Hx   . Amblyopia Neg Hx   . Blindness Neg Hx   . Cataracts Neg Hx   . Glaucoma Neg Hx   . Macular degeneration Neg Hx   . Retinal detachment Neg Hx   . Strabismus Neg Hx   . Retinitis pigmentosa Neg Hx     Social History Social History   Tobacco Use  . Smoking status: Never Smoker  . Smokeless tobacco: Never Used  Substance Use Topics  . Alcohol use: Yes     Alcohol/week: 1.0 standard drinks    Types: 1 Cans of beer per week    Comment: occa  . Drug use: No     Allergies   Shrimp [shellfish allergy] and Aleve [naproxen]   Review of Systems Review of Systems  Constitutional: Positive for fatigue. Negative for chills and fever.  HENT: Negative for congestion and hearing loss.   Eyes: Negative for pain.  Respiratory: Positive  for shortness of breath. Negative for cough.   Cardiovascular: Positive for palpitations. Negative for chest pain and leg swelling.  Gastrointestinal: Negative for abdominal pain, constipation and diarrhea.  Genitourinary: Negative for dysuria and frequency.  Musculoskeletal: Negative for myalgias.  Neurological: Positive for light-headedness. Negative for dizziness and headaches.  Psychiatric/Behavioral: The patient is nervous/anxious.      Physical Exam Triage Vital Signs ED Triage Vitals  Enc Vitals Group     BP 04/16/19 0819 123/80     Pulse Rate 04/16/19 0818 (!) 103     Resp 04/16/19 0818 18     Temp 04/16/19 0818 98.4 F (36.9 C)     Temp Source 04/16/19 0818 Oral     SpO2 04/16/19 0818 97 %     Weight --      Height --      Head Circumference --      Peak Flow --      Pain Score 04/16/19 0820 0     Pain Loc --      Pain Edu? --      Excl. in [place]? --    No data found.  Updated Vital Signs BP 123/80 (BP Location: Right Arm)   Pulse (!) 103   Temp 98.4 F (36.9 C) (Oral)   Resp 18   LMP 05/27/2017 (Approximate)   SpO2 97%   Physical Exam Constitutional:      General: She is not in acute distress.    Appearance: She is well-developed.  HENT:     Head: Normocephalic and atraumatic.     Mouth/Throat:     Mouth: Mucous membranes are moist.     Comments: Mask in place Eyes:     Conjunctiva/sclera: Conjunctivae normal.     Pupils: Pupils are equal, round, and reactive to light.  Neck:     Vascular: No carotid bruit.  Cardiovascular:     Rate and Rhythm: Regular rhythm. Tachycardia  present.     Heart sounds: Normal heart sounds.  Pulmonary:     Effort: Pulmonary effort is normal. No respiratory distress.     Breath sounds: Normal breath sounds. No wheezing or rales.  Abdominal:     General: There is no distension.     Palpations: Abdomen is soft.     Comments: Obese abdomen.  Soft  Musculoskeletal:        General: Normal range of motion.     Cervical back: Normal range of motion.  Skin:    General: Skin is warm and dry.  Neurological:     General: No focal deficit present.     Mental Status: She is alert.  Psychiatric:        Mood and Affect: Mood normal.        Behavior: Behavior normal.      UC Treatments / Results  Labs (all labs ordered are listed, but only abnormal results are displayed) Labs Reviewed  NOVEL CORONAVIRUS, NAA (HOSP ORDER, SEND-OUT TO REF LAB; TAT 18-24 HRS)  POC SARS CORONAVIRUS 2 AG -  ED  POC SARS CORONAVIRUS 2 AG  POC SARS negative Confirmation swab sent EKG-normal sinus rhythm.  Rate 93.  Normal intervals.  No ST or T wave changes   Radiology No results found.  Procedures Procedures (including critical care time)  Medications Ordered in UC Medications - No data to display  Initial Impression / Assessment and Plan / UC Course  I have reviewed the triage vital signs and the nursing notes.  Pertinent labs & imaging results that were available during my care of the patient were reviewed by me and considered in my medical decision making (see chart for details).     Reviewed causes a sinus tachycardia.  Patient thinks it may be from anxiety.  She is advised that she needs to call her primary care doctor for additional testing.  She may need a Holter monitor. Final Clinical Impressions(s) / UC Diagnoses   Final diagnoses:  Palpitations  Sinus tachycardia  Generalized anxiety disorder     Discharge Instructions     Contact your PCP about additional testing Avoid caffeine Drink plenty of fluids Go to ER if you  have chest pain or worsening rapid heartbeat with dizziness that does not go away Quarantine until you get your COVID test result   ED Prescriptions    None     PDMP not reviewed this encounter.   Raylene Everts, MD 04/16/19 1034

## 2019-04-16 NOTE — ED Notes (Signed)
Pt verbalized understanding of d/c instructions, follow up care and signs/symptoms requiring return to ED. Pt had no further questions at this time.

## 2019-04-16 NOTE — ED Triage Notes (Signed)
Pt back to the Ed today with c/o palpitations , pt was seen at [facility] for same this morning awaiting covid send out results

## 2019-04-16 NOTE — ED Triage Notes (Signed)
Pt c/o heart palpitations x3-4 days. Pain under right breast; radiating to mid back; lightheadedness, mild SOB.  Denies CP or diaphoresis.  Also states vision is blurry can see "black specks". Denies slurred speech.  Pt states daughter was possibly exposed to someone with COVID at dance class.  Pt states diarrhea 2 days ago, but denies other URI sx.  .Denies dysuria sx.

## 2019-04-18 LAB — NOVEL CORONAVIRUS, NAA (HOSP ORDER, SEND-OUT TO REF LAB; TAT 18-24 HRS): SARS-CoV-2, NAA: NOT DETECTED

## 2019-04-20 ENCOUNTER — Telehealth: Payer: Self-pay | Admitting: *Deleted

## 2019-04-20 ENCOUNTER — Encounter: Payer: Self-pay | Admitting: Internal Medicine

## 2019-04-20 ENCOUNTER — Other Ambulatory Visit: Payer: Self-pay

## 2019-04-20 ENCOUNTER — Encounter: Payer: Self-pay | Admitting: *Deleted

## 2019-04-20 ENCOUNTER — Ambulatory Visit (INDEPENDENT_AMBULATORY_CARE_PROVIDER_SITE_OTHER): Payer: Medicaid Other | Admitting: Internal Medicine

## 2019-04-20 VITALS — BP 128/87 | HR 108 | Temp 98.1°F | Ht 63.0 in | Wt 239.8 lb

## 2019-04-20 DIAGNOSIS — R0683 Snoring: Secondary | ICD-10-CM | POA: Diagnosis not present

## 2019-04-20 DIAGNOSIS — Z6841 Body Mass Index (BMI) 40.0 and over, adult: Secondary | ICD-10-CM

## 2019-04-20 DIAGNOSIS — D649 Anemia, unspecified: Secondary | ICD-10-CM

## 2019-04-20 DIAGNOSIS — R002 Palpitations: Secondary | ICD-10-CM | POA: Diagnosis not present

## 2019-04-20 DIAGNOSIS — R072 Precordial pain: Secondary | ICD-10-CM | POA: Diagnosis not present

## 2019-04-20 NOTE — Patient Instructions (Signed)
Medication Instructions:  No changes *If you need a refill on your cardiac medications before your next appointment, please call your pharmacy*  Lab Work: Not needed   Testing/Procedures: WILL BE SCHEDULE AT [place] TEST IS AUTHORIZED BY INSURANCE. Your physician has recommended that you have a sleep study. This test records several body functions during sleep, including: brain activity, eye movement, oxygen and carbon dioxide blood levels, heart rate and rhythm, breathing rate and rhythm, the flow of air through your mouth and nose, snoring, body muscle movements, and chest and belly movement.   WILL BE SCHEDULE AT [facility] [place] [address] Your physician has requested that you have an echocardiogram. Echocardiography is a painless test that uses sound waves to create images of your heart. It provides your doctor with information about the size and shape of your heart and how well your heart's chambers and valves are working. This procedure takes approximately one hour. There are no restrictions for this procedure.  AND Your physician has requested that you have a lexiscan myoview. Please follow instruction sheet, as given. AND Your physician has recommended that you wear a 3 DAY ZIO-PATCH monitor. The Zio patch cardiac monitor continuously records heart rhythm data for up to 14 days, this is for patients being evaluated for multiple types heart rhythms. For the first 24 hours post application, please avoid getting the Zio monitor wet in the shower or by excessive sweating during exercise. After that, feel free to carry on with regular activities. Keep soaps and lotions away from the ZIO XT Patch.  This will be mailed to you, please expect 7-10 days to receive.  [employer] location - [place], [address].         Follow-Up: At [facility], you and your health needs are our priority.  As part of our continuing mission to provide you with exceptional  heart care, we have created designated Provider Care Teams.  These Care Teams include your primary Cardiologist (physician) and Advanced Practice Providers (APPs -  Physician Assistants and Nurse Practitioners) who all work together to provide you with the care you need, when you need it.  Your next appointment:   6 week(s)  The format for your next appointment:   In Person  Provider:   Cherlynn Kaiser, MD  Other Instructions N/A    Bryn Gulling- Long Term Monitor Instructions   Your physician has requested you wear your ZIO patch monitor___3____days.   This is a single patch monitor.  [employer] supplies one patch monitor per enrollment.  Additional stickers are not available.   Please do not apply patch if you will be having a Nuclear Stress Test, Echocardiogram, Cardiac CT, MRI, or Chest Xray during the time frame you would be wearing the monitor. The patch cannot be worn during these tests.  You cannot remove and re-apply the ZIO XT patch monitor.   Your ZIO patch monitor will be sent USPS Priority mail from [facility] directly to your home address. The monitor may also be mailed to a PO BOX if home delivery is not available.   It may take 3-5 days to receive your monitor after you have been enrolled.   Once you have received you monitor, please review enclosed instructions.  Your monitor has already been registered assigning a specific monitor serial # to you.   Applying the monitor   Shave hair from upper left chest.   Hold abrader disc by orange tab.  Rub abrader in 40 strokes over left upper chest as indicated in your monitor instructions.   Clean area with 4 enclosed alcohol pads .  Use all pads to assure are is cleaned thoroughly.  Let dry.   Apply patch as indicated in monitor instructions.  Patch will be place under collarbone on left side of chest with arrow pointing upward.   Rub patch adhesive wings for 2 minutes.Remove white label marked "1".  Remove white label  marked "2".  Rub patch adhesive wings for 2 additional minutes.   While looking in a mirror, press and release button in center of patch.  A small green light will flash 3-4 times .  This will be your only indicator the monitor has been turned on.     Do not shower for the first 24 hours.  You may shower after the first 24 hours.   Press button if you feel a symptom. You will hear a small click.  Record Date, Time and Symptom in the Patient Log Book.   When you are ready to remove patch, follow instructions on last 2 pages of Patient Log Book.  Stick patch monitor onto last page of Patient Log Book.   Place Patient Log Book in [place] box.  Use locking tab on box and tape box closed securely.  The [employer] and [employer] has [employer] on it.  Please place in mailbox as soon as possible.  Your physician should have your test results approximately 7 days after the monitor has been mailed back to [facility].   Call [place] at [phone] if you have questions regarding your ZIO XT patch monitor.  Call them immediately if you see an orange light blinking on your monitor.   If your monitor falls off in less than 4 days contact our Monitor department at [phone].  If your monitor becomes loose or falls off after 4 days call [employer] at [phone] for suggestions on securing your monitor.

## 2019-04-20 NOTE — Telephone Encounter (Signed)
Patient notified of sleep study and [place] appointments. She plans to call the sleep lab to see if she can have COVID test done at urgent care due to her work schedule.

## 2019-04-20 NOTE — Progress Notes (Signed)
Patient ID: Sheryl Cox, female   DOB: 1970/06/27, 49 y.o.   MRN: [id number] Patient enrolled for [employer] to mail a 3 day ZIO XT long term holter monitor to her home.

## 2019-04-20 NOTE — Progress Notes (Signed)
Cardiology Office Note:    Date:  04/20/2019   ID:  Sheryl Cox, DOB [birth year], MRN [id number]  PCP:  Lucianne Lei, MD  Cardiologist:  No primary care provider on file.  Electrophysiologist:  None   Referring MD: Lucianne Lei, MD   Chief Complaint: Palpitations  History of Present Illness:    Sheryl Cox is a 49 y.o. female with a hx of asthma, breast cancer currently on tamoxifen, anemia, diabetes mellitus, migraine, hypertension.  She presents today for evaluation of palpitations.  She describes this as a heart fluttering.  1 week ago, she was working in [employer].  This is quite physical work. Fluttering in chest, breathing heavy, had to sit down and it calmed down. Diabetic, so she thought her blood sugar might be low and she drank OJ, felt better. Starts fluttering at home. 3-4x a day. Lasts for minutes at a time, and stops. Takes deep breath and it stops. Drinks cold water and helps. Chest pressure with the palpitations. SOB as well. No leg swelling.  She endorses snoring and has not been screened for sleep apnea.   Caffeine: 1 cup green tea Alcohol: none Water intake: dehydrated  Snoring: yes, has not been screened for sleep apnea TSH: not checked, normal previous Herbal supplements/diet products: none Syncope/presyncope: presyncope with heart racing and chest pressure.  Smoking - 5-10 years, 1/2 ppd  73 yo daughter - preeclampsia with that pregnancy.  Past Medical History:  Diagnosis Date  . Anemia    history of blood transfusion-no abnormal reaction noted  . Asthma    Albuterol as needed  . 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 Diabetes mellitus without complication ([place])    takes Metformin daily  . History of migraine    several yrs ago  . Hypertension    takes Tribenzor daily    Past Surgical History:  Procedure Laterality Date  . BIOPSY BREAST Left 12/05/12   fibroadenoma  . BREAST LUMPECTOMY     2014  . BREAST  SURGERY Right   . CESAREAN SECTION  01/05/2009  . CHOLECYSTECTOMY N/A 01/31/2016   Procedure: LAPAROSCOPIC CHOLECYSTECTOMY;  Surgeon: Erroll Luna, MD;  Location: [facility];  Service: General;  Laterality: N/A;  . DILITATION & CURRETTAGE/HYSTROSCOPY WITH HYDROTHERMAL ABLATION N/A 08/12/2013   Procedure: DILATATION & CURETTAGE/HYSTEROSCOPY WITH HYDROTHERMAL ABLATION;  Surgeon: Frederico Hamman, MD;  Location: [facility];  Service: Gynecology;  Laterality: N/A;  . ROBOTIC ASSISTED TOTAL HYSTERECTOMY N/A 06/18/2017   Procedure: XI ROBOTIC ASSISTED TOTAL HYSTERECTOMY;  Surgeon: Lavonia Drafts, MD;  Location: [facility];  Service: Gynecology;  Laterality: N/A;  . SALPINGOOPHORECTOMY Bilateral 06/18/2017   Procedure: BILATERAL SALPINGO OOPHORECTOMY;  Surgeon: Lavonia Drafts, MD;  Location: [facility];  Service: Gynecology;  Laterality: Bilateral;    Current Medications: Current Meds  Medication Sig  . acetaminophen (TYLENOL) 325 MG tablet Take 650 mg by mouth daily as needed for headache.  . albuterol (PROAIR HFA) 108 (90 Base) MCG/ACT inhaler Inhale 2 puffs into the lungs every 6 (six) hours as needed for shortness of breath.  . ALPRAZolam (XANAX) 0.25 MG tablet TAKE 1 TABLET BY MOUTH TWICE A DAY AS NEEDED FOR ANXIETY  . amLODipine (NORVASC) 10 MG tablet Take 10 mg by mouth daily.  Marland Kitchen atorvastatin (LIPITOR) 10 MG tablet Take 1 tablet (10 mg total) by mouth daily at 6 PM.  . cetirizine (ZYRTEC) 10 MG tablet Take 1 tablet (10 mg total) by mouth daily.  Marland Kitchen  doxycycline (VIBRAMYCIN) 100 MG capsule Take 1 capsule (100 mg total) by mouth 2 (two) times daily.  . fluticasone (FLONASE) 50 MCG/ACT nasal spray Place 2 sprays into both nostrils daily.  . hydrochlorothiazide (HYDRODIURIL) 12.5 MG tablet Take 2 tablets (25 mg total) by mouth daily. (Patient taking differently: Take 12.5 mg by mouth daily. )  . ipratropium (ATROVENT) 0.06 % nasal spray Place 2 sprays into both nostrils 4 (four) times daily.  .  meloxicam (MOBIC) 15 MG tablet Take 1 tablet (15 mg total) by mouth daily.  . metFORMIN (GLUCOPHAGE) 500 MG tablet Take 1 tablet (500 mg total) by mouth 2 (two) times daily with a meal.  . Multiple Vitamins-Minerals (MULTIVITAMIN PO) Take 1 tablet by mouth daily.  . tamoxifen (NOLVADEX) 20 MG tablet Take 1 tablet (20 mg total) by mouth daily.     Allergies:   Shrimp [shellfish allergy] and Aleve [naproxen]   Social History   Socioeconomic History  . Marital status: Single    Spouse name: Not on file  . Number of children: 1  . Years of education: Not on file  . Highest education level: Not on file  Occupational History  . 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 Smoking status: Never Smoker  . Smokeless tobacco: Never Used  Substance and Sexual Activity  . Alcohol use: Yes    Alcohol/week: 1.0 standard drinks    Types: 1 Cans of beer per week    Comment: occa  . Drug use: No  . Sexual activity: Not Currently    Birth control/protection: None    Comment: last intercourse > 1 year.  Other Topics Concern  . Not on file  Social History Narrative  . Not on file   Social Determinants of Health   Financial Resource Strain:   . Difficulty of Paying Living Expenses: Not on file  Food Insecurity:   . Worried About Charity fundraiser in the Last Year: Not on file  . Ran Out of Food in the Last Year: Not on file  Transportation Needs:   . Lack of Transportation (Medical): Not on file  . Lack of Transportation (Non-Medical): Not on file  Physical Activity:   . Days of Exercise per Week: Not on file  . Minutes of Exercise per Session: Not on file  Stress:   . Feeling of Stress : Not on file  Social Connections:   . Frequency of Communication with Friends and Family: Not on file  . Frequency of Social Gatherings with Friends and Family: Not on file  . Attends Religious Services: Not on file  . Active Member of Clubs or  Organizations: Not on file  . Attends Archivist Meetings: Not on file  . Marital Status: Not on file     Family History: The patient's family history includes Breast cancer in her paternal aunt, paternal grandmother, and another family member; Diabetes in her mother; Hypertension in her mother; Prostate cancer in her paternal grandfather and paternal uncle; Prostate cancer (age of onset: 58) in her father. There is no history of Anesthesia problems, Amblyopia, Blindness, Cataracts, Glaucoma, Macular degeneration, Retinal detachment, Strabismus, or Retinitis pigmentosa.  ROS:   Please see the history of present illness.    All other systems reviewed and are negative.  EKGs/Labs/Other Studies Reviewed:    The following studies were reviewed today:  EKG:  Not performed today.  04/17/19 - SR, rate 89  Recent  Labs: 04/16/2019: BUN 12; Creatinine, Ser 0.72; Hemoglobin 11.6; Platelets 309; Potassium 3.6; Sodium 139  Recent Lipid Panel    Component Value Date/Time   CHOL 161 05/08/2018 1216   TRIG 207 (H) 05/08/2018 1216   HDL 30 (L) 05/08/2018 1216   CHOLHDL 5.4 (H) 05/08/2018 1216   LDLCALC 90 05/08/2018 1216    Physical Exam:    VS:  BP 128/87   Pulse (!) 108   Temp 98.1 F (36.7 C)   Ht 5\' 3"  (1.6 m)   Wt 239 lb 12.8 oz (108.8 kg)   LMP 05/27/2017 (Approximate)   SpO2 95%   BMI 42.48 kg/m     Wt Readings from Last 5 Encounters:  04/20/19 239 lb 12.8 oz (108.8 kg)  04/04/19 240 lb (108.9 kg)  05/21/18 248 lb (112.5 kg)  05/14/18 241 lb (109.3 kg)  04/24/18 244 lb 3.2 oz (110.8 kg)     Constitutional: No acute distress Eyes: sclera non-icteric, normal conjunctiva and lids ENMT: normal dentition, moist mucous membranes Cardiovascular: regular rhythm, normal rate, no murmurs. S1 and S2 normal. Radial pulses normal bilaterally. No jugular venous distention.  Respiratory: clear to auscultation bilaterally GI : normal bowel sounds, soft and nontender. No  distention.   MSK: extremities warm, well perfused. No edema.  NEURO: grossly nonfocal exam, moves all extremities. PSYCH: alert and oriented x 3, normal mood and affect.   ASSESSMENT:    1. Palpitations   2. Precordial pain   3. Snoring   4. Anemia, unspecified type   5. BMI 40.0-44.9, adult [facility])    PLAN:    Palpitations-she says that her palpitations improve with cold water and deep breathing.  We have yet to define what these represent, therefore we will perform a cardiac monitor.  She has mild presyncope and chest pressure with her palpitations, warranting further investigation.  We will also obtain an echocardiogram to rule out structural causes contributing to palpitations.  Chest pressure-I am concerned with her history of diabetes and hypertension, as well as obesity that when she has fast heart rates she develops chest pressure.  This may represent ischemia in the setting of demand.  She requires a nuclear stress test to rule out ischemia.  With her diabetes concern for multivessel disease if present, we will evaluate all parameters including transient ischemic dilatation.  Snoring-patient needs a referral for sleep study.  Anemia-she has iron studies from 2015 which suggest iron deficiency anemia.  Consider supplementation with iron.  Can be coordinated by PCP.  Hemoglobin 11.6.  Hypertension-blood pressure well controlled today, continue amlodipine 10 mg daily as well as hydrochlorothiazide 25 mg daily.  History of breast cancer-patient continues on tamoxifen.  Diabetes mellitus-patient is on atorvastatin 10 mg daily.  LDL less than 100, however likely will need to intensify statin therapy for LDL less than 70 for aggressive risk factor modification.  Total time of encounter: 60 minutes total time of encounter, including 40 minutes spent in face-to-face patient care. This time includes coordination of care and counseling regarding above issues.Angus Seller of non-face-to-face  time involved reviewing chart documents/testing relevant to the patient encounter and documentation in the medical record.  Cherlynn Kaiser, MD [place]  [facility]   Medication Adjustments/Labs and Tests Ordered: Current medicines are reviewed at length with the patient today.  Concerns regarding medicines are outlined above.  Orders Placed This Encounter  Procedures  . MYOCARDIAL PERFUSION IMAGING  . LONG TERM MONITOR (3-14 DAYS)  . ECHOCARDIOGRAM COMPLETE  .  Split night study   No orders of the defined types were placed in this encounter.   Patient Instructions  Medication Instructions:  No changes *If you need a refill on your cardiac medications before your next appointment, please call your pharmacy*  Lab Work: Not needed   Testing/Procedures: WILL BE SCHEDULE AT [place] TEST IS AUTHORIZED BY INSURANCE. Your physician has recommended that you have a sleep study. This test records several body functions during sleep, including: brain activity, eye movement, oxygen and carbon dioxide blood levels, heart rate and rhythm, breathing rate and rhythm, the flow of air through your mouth and nose, snoring, body muscle movements, and chest and belly movement.   WILL BE SCHEDULE AT [place] Your physician has requested that you have an echocardiogram. Echocardiography is a painless test that uses sound waves to create images of your heart. It provides your doctor with information about the size and shape of your heart and how well your heart's chambers and valves are working. This procedure takes approximately one hour. There are no restrictions for this procedure.  AND Your physician has requested that you have a lexiscan myoview. Please follow instruction sheet, as given. AND Your physician has recommended that you wear a 3 DAY ZIO-PATCH monitor. The Zio patch cardiac monitor continuously records heart rhythm data for up to 14  days, this is for patients being evaluated for multiple types heart rhythms. For the first 24 hours post application, please avoid getting the Zio monitor wet in the shower or by excessive sweating during exercise. After that, feel free to carry on with regular activities. Keep soaps and lotions away from the ZIO XT Patch.  This will be mailed to you, please expect 7-10 days to receive.  [employer] location - [place], [address].         Follow-Up: At [facility], you and your health needs are our priority.  As part of our continuing mission to provide you with exceptional heart care, we have created designated Provider Care Teams.  These Care Teams include your primary Cardiologist (physician) and Advanced Practice Providers (APPs -  Physician Assistants and Nurse Practitioners) who all work together to provide you with the care you need, when you need it.  Your next appointment:   6 week(s)  The format for your next appointment:   In Person  Provider:   Cherlynn Kaiser, MD  Other Instructions N/A    Bryn Gulling- Long Term Monitor Instructions   Your physician has requested you wear your ZIO patch monitor___3____days.   This is a single patch monitor.  [employer] supplies one patch monitor per enrollment.  Additional stickers are not available.   Please do not apply patch if you will be having a Nuclear Stress Test, Echocardiogram, Cardiac CT, MRI, or Chest Xray during the time frame you would be wearing the monitor. The patch cannot be worn during these tests.  You cannot remove and re-apply the ZIO XT patch monitor.   Your ZIO patch monitor will be sent USPS Priority mail from [facility] directly to your home address. The monitor may also be mailed to a PO BOX if home delivery is not available.   It may take 3-5 days to receive your monitor after you have been enrolled.   Once you have received you monitor, please review enclosed instructions.  Your monitor has  already been registered assigning a specific monitor serial # to  you.   Applying the monitor   Shave hair from upper left chest.   Hold abrader disc by orange tab.  Rub abrader in 40 strokes over left upper chest as indicated in your monitor instructions.   Clean area with 4 enclosed alcohol pads .  Use all pads to assure are is cleaned thoroughly.  Let dry.   Apply patch as indicated in monitor instructions.  Patch will be place under collarbone on left side of chest with arrow pointing upward.   Rub patch adhesive wings for 2 minutes.Remove white label marked "1".  Remove white label marked "2".  Rub patch adhesive wings for 2 additional minutes.   While looking in a mirror, press and release button in center of patch.  A small green light will flash 3-4 times .  This will be your only indicator the monitor has been turned on.     Do not shower for the first 24 hours.  You may shower after the first 24 hours.   Press button if you feel a symptom. You will hear a small click.  Record Date, Time and Symptom in the Patient Log Book.   When you are ready to remove patch, follow instructions on last 2 pages of Patient Log Book.  Stick patch monitor onto last page of Patient Log Book.   Place Patient Log Book in [place] box.  Use locking tab on box and tape box closed securely.  The [employer] has [employer] on it.  Please place in mailbox as soon as possible.  Your physician should have your test results approximately 7 days after the monitor has been mailed back to [facility].   Call [place] at [phone] if you have questions regarding your ZIO XT patch monitor.  Call them immediately if you see an orange light blinking on your monitor.   If your monitor falls off in less than 4 days contact our Monitor department at [phone].  If your monitor becomes loose or falls off after 4 days call [employer] at [phone] for suggestions on securing your  monitor.

## 2019-04-21 ENCOUNTER — Ambulatory Visit: Payer: Medicaid Other | Admitting: Cardiology

## 2019-04-24 ENCOUNTER — Ambulatory Visit (INDEPENDENT_AMBULATORY_CARE_PROVIDER_SITE_OTHER): Payer: Medicaid Other

## 2019-04-24 DIAGNOSIS — R002 Palpitations: Secondary | ICD-10-CM | POA: Diagnosis not present

## 2019-04-24 DIAGNOSIS — R072 Precordial pain: Secondary | ICD-10-CM | POA: Diagnosis not present

## 2019-04-28 ENCOUNTER — Ambulatory Visit (HOSPITAL_COMMUNITY)
Admission: EM | Admit: 2019-04-28 | Discharge: 2019-04-28 | Disposition: A | Discharge status: Home / Self Care | Payer: Medicaid Other | Attending: Emergency Medicine | Admitting: Emergency Medicine

## 2019-04-28 ENCOUNTER — Other Ambulatory Visit (HOSPITAL_COMMUNITY): Admission: RE | Admit: 2019-04-28 | Payer: Medicaid Other | Source: Ambulatory Visit

## 2019-04-28 ENCOUNTER — Encounter (HOSPITAL_COMMUNITY): Payer: Self-pay

## 2019-04-28 ENCOUNTER — Other Ambulatory Visit: Payer: Self-pay

## 2019-04-28 DIAGNOSIS — Z20822 Contact with and (suspected) exposure to covid-19: Secondary | ICD-10-CM | POA: Diagnosis present

## 2019-04-28 NOTE — ED Triage Notes (Signed)
Patient presents to Urgent Care with complaints of needing a covid test since she is scheduled for a sleep study. Patient reports she has no sx or exposures.

## 2019-04-28 NOTE — ED Provider Notes (Signed)
[place]    CSN: [id number] Arrival date & time: 04/28/19  1444      History   Chief Complaint Chief Complaint  Patient presents with  . COVID Test    HPI Sheryl Cox is a 49 y.o. female history of DM type II, hypertension, presenting today for Covid testing.  Patient has plans to have a sleep study on Thursday and needs to have Covid testing prior to test.  She denies any known exposures.  Denies any symptoms including denying URI symptoms, fevers chills or body aches.  Denies GI symptoms.  Denies chest pain or shortness of breath.  HPI  Past Medical History:  Diagnosis Date  . Anemia    history of blood transfusion-no abnormal reaction noted  . Asthma    Albuterol as needed  . 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 Diabetes mellitus without complication ([place])    takes Metformin daily  . History of migraine    several yrs ago  . Hypertension    takes Tribenzor daily    Patient Active Problem List   Diagnosis Date Noted  . Chronic pain of both knees 05/21/2018  . Chronic right shoulder pain 05/21/2018  . BMI 40.0-44.9, adult ([place]) 05/21/2018  . Anemia 06/05/2013  . 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 Breast cancer of upper-outer quadrant of right female breast ([place]) 11/24/2012  . Nabothian cyst 12/07/2011  . Cervical mass 11/20/2011  . Fibroid uterus 11/20/2011    Past Surgical History:  Procedure Laterality Date  . BIOPSY BREAST Left 12/05/12   fibroadenoma  . BREAST LUMPECTOMY     2014  . BREAST SURGERY Right   . CESAREAN SECTION  01/05/2009  . CHOLECYSTECTOMY N/A 01/31/2016   Procedure: LAPAROSCOPIC CHOLECYSTECTOMY;  Surgeon: Erroll Luna, MD;  Location: [facility];  Service: General;  Laterality: N/A;  . DILITATION & CURRETTAGE/HYSTROSCOPY WITH HYDROTHERMAL ABLATION N/A 08/12/2013   Procedure: DILATATION & CURETTAGE/HYSTEROSCOPY WITH HYDROTHERMAL ABLATION;  Surgeon: Frederico Hamman, MD;  Location:  [facility];  Service: Gynecology;  Laterality: N/A;  . ROBOTIC ASSISTED TOTAL HYSTERECTOMY N/A 06/18/2017   Procedure: XI ROBOTIC ASSISTED TOTAL HYSTERECTOMY;  Surgeon: Lavonia Drafts, MD;  Location: [facility];  Service: Gynecology;  Laterality: N/A;  . SALPINGOOPHORECTOMY Bilateral 06/18/2017   Procedure: BILATERAL SALPINGO OOPHORECTOMY;  Surgeon: Lavonia Drafts, MD;  Location: [facility];  Service: Gynecology;  Laterality: Bilateral;    OB History    Gravida  1   Para  1   Term  1   Preterm  0   AB  0   Living  1     SAB  0   TAB  0   Ectopic  0   Multiple  0   Live Births  1            Home Medications    Prior to Admission medications   Medication Sig Start Date End Date Taking? Authorizing Provider  acetaminophen (TYLENOL) 325 MG tablet Take 650 mg by mouth daily as needed for headache.    [provider]  albuterol (PROAIR HFA) 108 (90 Base) MCG/ACT inhaler Inhale 2 puffs into the lungs every 6 (six) hours as needed for shortness of breath. 05/12/18   Tasia Catchings, Amy V, PA-C  ALPRAZolam (XANAX) 0.25 MG tablet TAKE 1 TABLET BY MOUTH TWICE A DAY AS NEEDED FOR ANXIETY 08/25/18   Lucianne Lei, MD  amLODipine (NORVASC) 10 MG tablet Take 10 mg by  mouth daily. 02/23/17   [provider]  atorvastatin (LIPITOR) 10 MG tablet Take 1 tablet (10 mg total) by mouth daily at 6 PM. 05/14/18   Scot Jun, FNP  cetirizine (ZYRTEC) 10 MG tablet Take 1 tablet (10 mg total) by mouth daily. 04/28/18   Scot Jun, FNP  fluticasone (FLONASE) 50 MCG/ACT nasal spray Place 2 sprays into both nostrils daily. 05/12/18   Tasia Catchings, Amy V, PA-C  hydrochlorothiazide (HYDRODIURIL) 12.5 MG tablet Take 2 tablets (25 mg total) by mouth daily. Patient taking differently: Take 12.5 mg by mouth daily.  05/14/18   Scot Jun, FNP  ipratropium (ATROVENT) 0.06 % nasal spray Place 2 sprays into both nostrils 4 (four) times daily. 05/12/18   Ok Edwards, PA-C  meloxicam (MOBIC) 15 MG  tablet Take 1 tablet (15 mg total) by mouth daily. 05/14/18   Scot Jun, FNP  metFORMIN (GLUCOPHAGE) 500 MG tablet Take 1 tablet (500 mg total) by mouth 2 (two) times daily with a meal. 06/19/17   Lavonia Drafts, MD  Multiple Vitamins-Minerals (MULTIVITAMIN PO) Take 1 tablet by mouth daily.    [provider]  tamoxifen (NOLVADEX) 20 MG tablet Take 1 tablet (20 mg total) by mouth daily. 09/11/18   Nicholas Lose, MD    Family History Family History  Problem Relation Age of Onset  . Hypertension Mother   . Diabetes Mother   . Breast cancer Paternal Grandmother        dx in her 67s  . Prostate cancer Paternal Grandfather   . Prostate cancer Father 71  . Prostate cancer Paternal Uncle   . Breast cancer Other        paternal grandmother's sister  . Breast cancer Paternal Aunt   . Anesthesia problems Neg Hx   . Amblyopia Neg Hx   . Blindness Neg Hx   . Cataracts Neg Hx   . Glaucoma Neg Hx   . Macular degeneration Neg Hx   . Retinal detachment Neg Hx   . Strabismus Neg Hx   . Retinitis pigmentosa Neg Hx     Social History Social History   Tobacco Use  . Smoking status: Never Smoker  . Smokeless tobacco: Never Used  Substance Use Topics  . Alcohol use: Yes    Alcohol/week: 1.0 standard drinks    Types: 1 Cans of beer per week    Comment: occa  . Drug use: No     Allergies   Shrimp [shellfish allergy] and Aleve [naproxen]   Review of Systems Review of Systems  Constitutional: Negative for activity change, appetite change, chills, fatigue and fever.  HENT: Negative for congestion, ear pain, rhinorrhea, sinus pressure, sore throat and trouble swallowing.   Eyes: Negative for discharge and redness.  Respiratory: Negative for cough, chest tightness and shortness of breath.   Cardiovascular: Negative for chest pain.  Gastrointestinal: Negative for abdominal pain, diarrhea, nausea and vomiting.  Musculoskeletal: Negative for myalgias.  Skin: Negative  for rash.  Neurological: Negative for dizziness, light-headedness and headaches.     Physical Exam Triage Vital Signs ED Triage Vitals  Enc Vitals Group     BP 04/28/19 1503 106/76     Pulse Rate 04/28/19 1503 91     Resp 04/28/19 1503 16     Temp 04/28/19 1503 99 F (37.2 C)     Temp Source 04/28/19 1503 Oral     SpO2 04/28/19 1503 100 %     Weight --  Height --      Head Circumference --      Peak Flow --      Pain Score 04/28/19 1501 0     Pain Loc --      Pain Edu? --      Excl. in [place]? --    No data found.  Updated Vital Signs BP 106/76 (BP Location: Right Arm)   Pulse 91   Temp 99 F (37.2 C) (Oral)   Resp 16   LMP 05/27/2017 (Approximate)   SpO2 100%   Visual Acuity Right Eye Distance:   Left Eye Distance:   Bilateral Distance:    Right Eye Near:   Left Eye Near:    Bilateral Near:     Physical Exam Vitals and nursing note reviewed.  Constitutional:      Appearance: She is well-developed.     Comments: No acute distress  HENT:     Head: Normocephalic and atraumatic.     Nose: Nose normal.  Eyes:     Conjunctiva/sclera: Conjunctivae normal.  Cardiovascular:     Rate and Rhythm: Normal rate.  Pulmonary:     Effort: Pulmonary effort is normal. No respiratory distress.  Abdominal:     General: There is no distension.  Musculoskeletal:        General: Normal range of motion.     Cervical back: Neck supple.  Skin:    General: Skin is warm and dry.  Neurological:     Mental Status: She is alert and oriented to person, place, and time.      UC Treatments / Results  Labs (all labs ordered are listed, but only abnormal results are displayed) Labs Reviewed  NOVEL CORONAVIRUS, NAA (HOSP ORDER, SEND-OUT TO REF LAB; TAT 18-24 HRS)    EKG   Radiology No results found.  Procedures Procedures (including critical care time)  Medications Ordered in UC Medications - No data to display  Initial Impression / Assessment and Plan / UC  Course  I have reviewed the triage vital signs and the nursing notes.  Pertinent labs & imaging results that were available during my care of the patient were reviewed by me and considered in my medical decision making (see chart for details).     Covid PCR pending, monitor my chart for results.  Asymptomatic and no known exposures at this time.  Discussed strict return precautions. Patient verbalized understanding and is agreeable with plan.  Final Clinical Impressions(s) / UC Diagnoses   Final diagnoses:  Encounter for laboratory testing for COVID-19 virus     Discharge Instructions     Monitor Mychart for results   ED Prescriptions    None     PDMP not reviewed this encounter.   Janith Lima, [place] 04/28/19 1538

## 2019-04-28 NOTE — Discharge Instructions (Signed)
Monitor Mychart for results

## 2019-04-30 ENCOUNTER — Other Ambulatory Visit: Payer: Self-pay

## 2019-04-30 ENCOUNTER — Ambulatory Visit (HOSPITAL_BASED_OUTPATIENT_CLINIC_OR_DEPARTMENT_OTHER): Payer: Medicaid Other | Attending: Cardiology | Admitting: Cardiovascular Disease

## 2019-04-30 DIAGNOSIS — Z79899 Other long term (current) drug therapy: Secondary | ICD-10-CM | POA: Diagnosis not present

## 2019-04-30 DIAGNOSIS — Z6841 Body Mass Index (BMI) 40.0 and over, adult: Secondary | ICD-10-CM | POA: Insufficient documentation

## 2019-04-30 DIAGNOSIS — R072 Precordial pain: Secondary | ICD-10-CM | POA: Diagnosis not present

## 2019-04-30 DIAGNOSIS — Z7984 Long term (current) use of oral hypoglycemic drugs: Secondary | ICD-10-CM | POA: Insufficient documentation

## 2019-04-30 DIAGNOSIS — R002 Palpitations: Secondary | ICD-10-CM | POA: Diagnosis not present

## 2019-04-30 DIAGNOSIS — E669 Obesity, unspecified: Secondary | ICD-10-CM | POA: Insufficient documentation

## 2019-04-30 DIAGNOSIS — R0683 Snoring: Secondary | ICD-10-CM | POA: Diagnosis present

## 2019-04-30 DIAGNOSIS — G4733 Obstructive sleep apnea (adult) (pediatric): Secondary | ICD-10-CM | POA: Insufficient documentation

## 2019-04-30 DIAGNOSIS — R0902 Hypoxemia: Secondary | ICD-10-CM | POA: Diagnosis not present

## 2019-04-30 LAB — NOVEL CORONAVIRUS, NAA (HOSP ORDER, SEND-OUT TO REF LAB; TAT 18-24 HRS): SARS-CoV-2, NAA: NOT DETECTED

## 2019-05-06 ENCOUNTER — Telehealth (HOSPITAL_COMMUNITY): Payer: Self-pay | Admitting: *Deleted

## 2019-05-06 NOTE — Telephone Encounter (Signed)
Patient given detailed instructions per Myocardial Perfusion Study Information Sheet for the test on 05/11/19 at 1:15. Patient notified to arrive 15 minutes early and that it is imperative to arrive on time for appointment to keep from having the test rescheduled.  If you need to cancel or reschedule your appointment, please call the office within 24 hours of your appointment. . Patient verbalized understanding.Veronia Beets

## 2019-05-11 ENCOUNTER — Ambulatory Visit (HOSPITAL_COMMUNITY): Payer: Medicaid Other | Attending: Cardiovascular Disease

## 2019-05-11 ENCOUNTER — Other Ambulatory Visit: Payer: Self-pay

## 2019-05-11 ENCOUNTER — Ambulatory Visit (HOSPITAL_BASED_OUTPATIENT_CLINIC_OR_DEPARTMENT_OTHER): Payer: Medicaid Other

## 2019-05-11 DIAGNOSIS — R072 Precordial pain: Secondary | ICD-10-CM | POA: Diagnosis present

## 2019-05-11 DIAGNOSIS — R002 Palpitations: Secondary | ICD-10-CM

## 2019-05-11 MED ORDER — TECHNETIUM TC 99M TETROFOSMIN IV KIT
32.9000 | PACK | Freq: Once | INTRAVENOUS | Status: AC | PRN
  Administered 2019-05-11: 13:00:00 32.9 via INTRAVENOUS
  Filled 2019-05-11: qty 33

## 2019-05-11 MED ORDER — REGADENOSON 0.4 MG/5ML IV SOLN
0.4000 mg | Freq: Once | INTRAVENOUS | Status: AC
  Administered 2019-05-11: 13:00:00 0.4 mg via INTRAVENOUS

## 2019-05-12 ENCOUNTER — Ambulatory Visit (HOSPITAL_COMMUNITY): Payer: Medicaid Other | Attending: Cardiovascular Disease

## 2019-05-12 LAB — MYOCARDIAL PERFUSION IMAGING
LV dias vol: 57 mL (ref 46–106)
LV sys vol: 18 mL
Peak HR: 127 {beats}/min
Rest HR: 95 {beats}/min
SDS: 0
SRS: 0
SSS: 0
TID: 0.88

## 2019-05-12 MED ORDER — TECHNETIUM TC 99M TETROFOSMIN IV KIT
31.6000 | PACK | Freq: Once | INTRAVENOUS | Status: AC | PRN
  Administered 2019-05-12: 13:00:00 31.6 via INTRAVENOUS
  Filled 2019-05-12: qty 32

## 2019-05-13 NOTE — Procedures (Signed)
Patient Name: Sheryl Cox, Sheryl Cox Date: 04/30/2019 Gender: Female D.O.B: Mar 30, 1970 Age (years): 75 Referring Provider: Cherlynn Kaiser MD Height (inches): 63 Interpreting Physician: Shelva Majestic MD, ABSM Weight (lbs): 234 RPSGT: Baxter Flattery BMI: 41 MRN: [id number] Neck Size: 15.00  CLINICAL INFORMATION Sleep Study Type: NPSG  Indication for sleep study: Obesity, Snoring, Witnesses Apnea / Gasping During Sleep  Epworth Sleepiness Score: 1  SLEEP STUDY TECHNIQUE As per the AASM Manual for the Scoring of Sleep and Associated Events v2.3 (April 2016) with a hypopnea requiring 4% desaturations.  The channels recorded and monitored were frontal, central and occipital EEG, electrooculogram (EOG), submentalis EMG (chin), nasal and oral airflow, thoracic and abdominal wall motion, anterior tibialis EMG, snore microphone, electrocardiogram, and pulse oximetry.  MEDICATIONS acetaminophen (TYLENOL) 325 MG tablet  albuterol (PROAIR HFA) 108 (90 Base) MCG/ACT inhaler  ALPRAZolam (XANAX) 0.25 MG tablet  amLODipine (NORVASC) 10 MG tablet  atorvastatin (LIPITOR) 10 MG tablet  cetirizine (ZYRTEC) 10 MG tablet  fluticasone (FLONASE) 50 MCG/ACT nasal spray  hydrochlorothiazide (HYDRODIURIL) 12.5 MG tablet  ipratropium (ATROVENT) 0.06 % nasal spray  meloxicam (MOBIC) 15 MG tablet  metFORMIN (GLUCOPHAGE) 500 MG tablet  Multiple Vitamins-Minerals (MULTIVITAMIN PO)  tamoxifen (NOLVADEX) 20 MG tablet  Medications self-administered by patient taken the night of the study : N/A  SLEEP ARCHITECTURE The study was initiated at 10:16:54 PM and ended at 4:56:23 AM.  Sleep onset time was 99.0 minutes and the sleep efficiency was 55.6%%. The total sleep time was 222 minutes.  Stage REM latency was 247.0 minutes.  The patient spent 24.3%% of the night in stage N1 sleep, 70.5%% in stage N2 sleep, 0.0%% in stage N3 and 5.2% in REM.  Alpha intrusion was absent.  Supine sleep was  85.43%.  RESPIRATORY PARAMETERS The overall apnea/hypopnea index (AHI) was 5.4 per hour. The respiratory disturbance index (RDI) was 5.7/h. There were 2 total apneas, including 2 obstructive, 0 central and 0 mixed apneas. There were 18 hypopneas and 1 RERAs.  The AHI during Stage REM sleep was 31.3 per hour.  AHI while supine was 6.0 per hour.  The mean oxygen saturation was 91.6%. The minimum SpO2 during sleep was 78.0%.  Moderate snoring was noted during this study.  CARDIAC DATA The 2 lead EKG demonstrated sinus rhythm. The mean heart rate was 91.4 beats per minute. Other EKG findings include: None.   LEG MOVEMENT DATA The total PLMS were 0 with a resulting PLMS index of 0.0. Associated arousal with leg movement index was 0.0 .  IMPRESSIONS - Mild obstructive sleep apnea overall (AHI = 5.4/h); however, sleep apnea is severe during REM sleep (AHI 31.3/h). - No significant central sleep apnea occurred during this study (CAI = 0.0/h). - Moderate severe oxygen desaturation to a nadir of 78.0%. - The patient snored with moderate snoring volume. - No cardiac abnormalities were noted during this study. - Clinically significant periodic limb movements did not occur during sleep. No significant associated arousals.  DIAGNOSIS - Obstructive Sleep Apnea (327.23 [G47.33 ICD-10]) - Nocturnal Hypoxemia (327.26 [G47.36 ICD-10])  RECOMMENDATIONS - Althought overall sleep apnea is mild, with severe sleep apnea during REM sleep and significant O2 desaturation to 78% in this patient with cardiovascular comorbidities recommend an in-lab CPAP titration study. - Effort should be made to optimnize nasal and oropharyngeal patency. - Positional therapy avoiding supine position during sleep. - Avoid alcohol, sedatives and other CNS depressants that may worsen sleep apnea and disrupt normal sleep architecture. - Sleep hygiene should  be reviewed to assess factors that may improve sleep quality. - Weight  management (BMI 41) and regular exercise should be initiated or continued if appropriate.  [Electronically signed] 05/13/2019 05:42 PM  Shelva Majestic MD, [facility], [place], American Board of Sleep Medicine   NPI: [id number] [place] PH: [phone]   FX: [phone] [place]

## 2019-05-14 ENCOUNTER — Telehealth: Payer: Self-pay | Admitting: *Deleted

## 2019-05-14 ENCOUNTER — Other Ambulatory Visit: Payer: Self-pay | Admitting: Cardiovascular Disease

## 2019-05-14 DIAGNOSIS — G4736 Sleep related hypoventilation in conditions classified elsewhere: Secondary | ICD-10-CM

## 2019-05-14 DIAGNOSIS — G4733 Obstructive sleep apnea (adult) (pediatric): Secondary | ICD-10-CM

## 2019-05-14 DIAGNOSIS — IMO0002 Reserved for concepts with insufficient information to code with codable children: Secondary | ICD-10-CM

## 2019-05-14 NOTE — Telephone Encounter (Signed)
Patient returned a call to me and was given sleep study results and recommendations. CPAP titration and COVID appointments given to patient.

## 2019-05-14 NOTE — Telephone Encounter (Signed)
Left message to return a call. (sleep study results)

## 2019-05-25 ENCOUNTER — Encounter (HOSPITAL_BASED_OUTPATIENT_CLINIC_OR_DEPARTMENT_OTHER): Payer: Self-pay

## 2019-05-26 ENCOUNTER — Other Ambulatory Visit (HOSPITAL_COMMUNITY): Payer: Medicaid Other

## 2019-05-29 ENCOUNTER — Encounter (HOSPITAL_BASED_OUTPATIENT_CLINIC_OR_DEPARTMENT_OTHER): Payer: Medicaid Other | Admitting: Cardiovascular Disease

## 2019-06-01 ENCOUNTER — Ambulatory Visit: Payer: Medicaid Other | Admitting: Internal Medicine

## 2019-06-01 ENCOUNTER — Encounter: Payer: Self-pay | Admitting: Internal Medicine

## 2019-06-01 ENCOUNTER — Other Ambulatory Visit: Payer: Self-pay

## 2019-06-01 VITALS — BP 130/90 | HR 86 | Temp 97.2°F | Ht 63.0 in | Wt 243.0 lb

## 2019-06-01 DIAGNOSIS — G4733 Obstructive sleep apnea (adult) (pediatric): Secondary | ICD-10-CM | POA: Diagnosis not present

## 2019-06-01 DIAGNOSIS — R072 Precordial pain: Secondary | ICD-10-CM | POA: Diagnosis not present

## 2019-06-01 DIAGNOSIS — G8929 Other chronic pain: Secondary | ICD-10-CM

## 2019-06-01 DIAGNOSIS — R002 Palpitations: Secondary | ICD-10-CM

## 2019-06-01 DIAGNOSIS — R0683 Snoring: Secondary | ICD-10-CM | POA: Diagnosis not present

## 2019-06-01 DIAGNOSIS — M25562 Pain in left knee: Secondary | ICD-10-CM

## 2019-06-01 DIAGNOSIS — Z6841 Body Mass Index (BMI) 40.0 and over, adult: Secondary | ICD-10-CM

## 2019-06-01 DIAGNOSIS — D649 Anemia, unspecified: Secondary | ICD-10-CM

## 2019-06-01 DIAGNOSIS — M25561 Pain in right knee: Secondary | ICD-10-CM

## 2019-06-01 NOTE — Patient Instructions (Signed)
Medication Instructions:  NO CHANGES *If you need a refill on your cardiac medications before your next appointment, please call your pharmacy*   Lab Work: NOT NEEDED    Testing/Procedures: NOT NEEDED   Follow-Up: At [facility], you and your health needs are our priority.  As part of our continuing mission to provide you with exceptional heart care, we have created designated Provider Care Teams.  These Care Teams include your primary Cardiologist (physician) and Advanced Practice Providers (APPs -  Physician Assistants and Nurse Practitioners) who all work together to provide you with the care you need, when you need it.    Your next appointment:   6 month(s)  The format for your next appointment:   In Person  Provider:   Cherlynn Kaiser, MD

## 2019-06-01 NOTE — Progress Notes (Signed)
Cardiology Office Note:    Date:  06/01/2019   ID:  Sheryl Cox, DOB 1970-08-09, MRN [id number]  PCP:  Lucianne Lei, MD  Cardiologist:  Elouise Munroe, MD  Electrophysiologist:  None   Referring MD: Lucianne Lei, MD   Chief Complaint: f/u palpitations  History of Present Illness:    Sheryl Cox is a 49 y.o. female with a hx of asthma, breast cancer currently on tamoxifen, anemia, diabetes mellitus, migraine, hypertension.  She presents today for follow up of palpitations. She tells me overall she is feeling better.   We reviewed results of Echo, stress test and cardiac monitor with overall reassuring findings. Answered all questions to the best of my ability. Echo showed normal LV Function, stress myoview was low risk, monitor showed rare ectopy. Thorough cardiovascular testing completed with no worrisome findings.   Past Medical History:  Diagnosis Date  . Anemia    history of blood transfusion-no abnormal reaction noted  . Asthma    Albuterol as needed  . 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 Diabetes mellitus without complication ([place])    takes Metformin daily  . History of migraine    several yrs ago  . Hypertension    takes Tribenzor daily    Past Surgical History:  Procedure Laterality Date  . BIOPSY BREAST Left 12/05/12   fibroadenoma  . BREAST LUMPECTOMY     2014  . BREAST SURGERY Right   . CESAREAN SECTION  01/05/2009  . CHOLECYSTECTOMY N/A 01/31/2016   Procedure: LAPAROSCOPIC CHOLECYSTECTOMY;  Surgeon: Erroll Luna, MD;  Location: [place] [facility];  Service: General;  Laterality: N/A;  . DILITATION & CURRETTAGE/HYSTROSCOPY WITH HYDROTHERMAL ABLATION N/A 08/12/2013   Procedure: DILATATION & CURETTAGE/HYSTEROSCOPY WITH HYDROTHERMAL ABLATION;  Surgeon: Frederico Hamman, MD;  Location: [facility];  Service: Gynecology;  Laterality: N/A;  . ROBOTIC ASSISTED TOTAL HYSTERECTOMY N/A 06/18/2017   Procedure: XI ROBOTIC ASSISTED TOTAL  HYSTERECTOMY;  Surgeon: Lavonia Drafts, MD;  Location: [facility];  Service: Gynecology;  Laterality: N/A;  . SALPINGOOPHORECTOMY Bilateral 06/18/2017   Procedure: BILATERAL SALPINGO OOPHORECTOMY;  Surgeon: Lavonia Drafts, MD;  Location: [facility];  Service: Gynecology;  Laterality: Bilateral;    Current Medications: Current Meds  Medication Sig  . acetaminophen (TYLENOL) 325 MG tablet Take 650 mg by mouth daily as needed for headache.  . albuterol (PROAIR HFA) 108 (90 Base) MCG/ACT inhaler Inhale 2 puffs into the lungs every 6 (six) hours as needed for shortness of breath.  . ALPRAZolam (XANAX) 0.25 MG tablet TAKE 1 TABLET BY MOUTH TWICE A DAY AS NEEDED FOR ANXIETY  . amLODipine (NORVASC) 10 MG tablet Take 10 mg by mouth daily.  Marland Kitchen atorvastatin (LIPITOR) 10 MG tablet Take 1 tablet (10 mg total) by mouth daily at 6 PM.  . fluticasone (FLONASE) 50 MCG/ACT nasal spray Place 2 sprays into both nostrils daily.  . hydrochlorothiazide (HYDRODIURIL) 12.5 MG tablet Take 2 tablets (25 mg total) by mouth daily. (Patient taking differently: Take 12.5 mg by mouth daily. )  . meloxicam (MOBIC) 15 MG tablet Take 1 tablet (15 mg total) by mouth daily.  . metFORMIN (GLUCOPHAGE) 500 MG tablet Take 1 tablet (500 mg total) by mouth 2 (two) times daily with a meal.  . Multiple Vitamins-Minerals (MULTIVITAMIN PO) Take 1 tablet by mouth daily.  . tamoxifen (NOLVADEX) 20 MG tablet Take 1 tablet (20 mg total) by mouth daily.  . [DISCONTINUED] ipratropium (ATROVENT) 0.06 % nasal spray Place 2  sprays into both nostrils 4 (four) times daily.     Allergies:   Shrimp [shellfish allergy] and Aleve [naproxen]   Social History   Socioeconomic History  . Marital status: Single    Spouse name: Not on file  . Number of children: 1  . Years of education: Not on file  . Highest education level: Not on file  Occupational History  . 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 Smoking status: Never Smoker  . Smokeless tobacco: Never Used  Substance and Sexual Activity  . Alcohol use: Yes    Alcohol/week: 1.0 standard drinks    Types: 1 Cans of beer per week    Comment: occa  . Drug use: No  . Sexual activity: Not Currently    Birth control/protection: None    Comment: last intercourse > 1 year.  Other Topics Concern  . Not on file  Social History Narrative  . Not on file   Social Determinants of Health   Financial Resource Strain:   . Difficulty of Paying Living Expenses:   Food Insecurity:   . Worried About Charity fundraiser in the Last Year:   . Arboriculturist in the Last Year:   Transportation Needs:   . Film/video editor (Medical):   Marland Kitchen Lack of Transportation (Non-Medical):   Physical Activity:   . Days of Exercise per Week:   . Minutes of Exercise per Session:   Stress:   . Feeling of Stress :   Social Connections:   . Frequency of Communication with Friends and Family:   . Frequency of Social Gatherings with Friends and Family:   . Attends Religious Services:   . Active Member of Clubs or Organizations:   . Attends Archivist Meetings:   Marland Kitchen Marital Status:      Family History: The patient's family history includes Breast cancer in her paternal aunt, paternal grandmother, and another family member; Diabetes in her mother; Hypertension in her mother; Prostate cancer in her paternal grandfather and paternal uncle; Prostate cancer (age of onset: 24) in her father. There is no history of Anesthesia problems, Amblyopia, Blindness, Cataracts, Glaucoma, Macular degeneration, Retinal detachment, Strabismus, or Retinitis pigmentosa.  ROS:   Please see the history of present illness.    All other systems reviewed and are negative.  EKGs/Labs/Other Studies Reviewed:    The following studies were reviewed today:  EKG:  Not performed today  Recent Labs: 04/16/2019: BUN 12; Creatinine, Ser 0.72; Hemoglobin 11.6;  Platelets 309; Potassium 3.6; Sodium 139  Recent Lipid Panel    Component Value Date/Time   CHOL 161 05/08/2018 1216   TRIG 207 (H) 05/08/2018 1216   HDL 30 (L) 05/08/2018 1216   CHOLHDL 5.4 (H) 05/08/2018 1216   LDLCALC 90 05/08/2018 1216    Physical Exam:    VS:  BP 130/90   Pulse 86   Temp (!) 97.2 F (36.2 C)   Ht 5\' 3"  (1.6 m)   Wt 243 lb (110.2 kg)   LMP 05/27/2017 (Approximate)   SpO2 97%   BMI 43.05 kg/m     Wt Readings from Last 5 Encounters:  06/01/19 243 lb (110.2 kg)  05/11/19 239 lb (108.4 kg)  04/30/19 234 lb (106.1 kg)  04/20/19 239 lb 12.8 oz (108.8 kg)  04/04/19 240 lb (108.9 kg)     Constitutional: No acute distress Eyes: sclera non-icteric, normal conjunctiva and lids  ENMT: normal dentition, moist mucous membranes Cardiovascular: regular rhythm, normal rate, no murmurs. S1 and S2 normal. Radial pulses normal bilaterally. No jugular venous distention.  Respiratory: clear to auscultation bilaterally GI : normal bowel sounds, soft and nontender. No distention.   MSK: extremities warm, well perfused. No edema.  NEURO: grossly nonfocal exam, moves all extremities. PSYCH: alert and oriented x 3, normal mood and affect.   ASSESSMENT:    1. Palpitations   2. Precordial pain   3. Obstructive sleep apnea (adult) (pediatric)   4. Snoring   5. Anemia, unspecified type   6. BMI 40.0-44.9, adult ([place])   7. Chronic pain of both knees    PLAN:    Palpitations - no significant findings on monitor. Offered symptomatic therapy with BB, she will defer at this time.   Chest pain - no significant chest pain since last visit. Myoview low risk. Continue to observe.  HTN - continue amlodipine, HCTZ  Hypertriglyceridemia - continue atorvastatin 10 mg daily.   Obstructive sleep apnea (adult) (pediatric) - encouraged continued assessment and compliance with therapy.   Total time of encounter: 30 minutes total time of encounter, including 18 minutes spent in  face-to-face patient care. This time includes coordination of care and counseling regarding above mentioned problem list. Remainder of non-face-to-face time involved reviewing chart documents/testing relevant to the patient encounter and documentation in the medical record. I have independently reviewed documentation from referring provider.   Cherlynn Kaiser, MD [place]  [facility]    Medication Adjustments/Labs and Tests Ordered: Current medicines are reviewed at length with the patient today.  Concerns regarding medicines are outlined above.  No orders of the defined types were placed in this encounter.  No orders of the defined types were placed in this encounter.   Patient Instructions  Medication Instructions:  NO CHANGES *If you need a refill on your cardiac medications before your next appointment, please call your pharmacy*   Lab Work: NOT NEEDED    Testing/Procedures: NOT NEEDED   Follow-Up: At [facility], you and your health needs are our priority.  As part of our continuing mission to provide you with exceptional heart care, we have created designated Provider Care Teams.  These Care Teams include your primary Cardiologist (physician) and Advanced Practice Providers (APPs -  Physician Assistants and Nurse Practitioners) who all work together to provide you with the care you need, when you need it.    Your next appointment:   6 month(s)  The format for your next appointment:   In Person  Provider:   Cherlynn Kaiser, MD

## 2019-06-19 ENCOUNTER — Ambulatory Visit: Payer: Medicaid Other

## 2019-06-22 ENCOUNTER — Ambulatory Visit: Payer: Medicaid Other | Admitting: Registered"

## 2019-06-26 ENCOUNTER — Ambulatory Visit: Payer: Medicaid Other | Attending: Internal Medicine

## 2019-06-26 DIAGNOSIS — Z23 Encounter for immunization: Secondary | ICD-10-CM

## 2019-06-26 NOTE — Progress Notes (Signed)
   Covid-19 Vaccination Clinic  Name:  Sheryl Cox    MRN: [id number] DOB: 20-Oct-1970  06/26/2019  Ms. Deschamp was observed post Covid-19 immunization for 30 minutes based on pre-vaccination screening without incident. She was provided with Vaccine Information Sheet and instruction to access the V-Safe system.   Ms. Hirn was instructed to call 911 with any severe reactions post vaccine: Marland Kitchen Difficulty breathing  . Swelling of face and throat  . A fast heartbeat  . A bad rash all over body  . Dizziness and weakness   Immunizations Administered    Name Date Dose VIS Date Route   Pfizer COVID-19 Vaccine 06/26/2019 11:42 AM 0.3 mL 02/27/2019 Intramuscular   Manufacturer: [place]   Lot: [id number]   [place]: [id number]

## 2019-07-20 ENCOUNTER — Ambulatory Visit: Payer: Medicaid Other

## 2019-07-29 ENCOUNTER — Encounter: Payer: Medicaid Other | Attending: Family Medicine | Admitting: Registered"

## 2019-07-29 ENCOUNTER — Other Ambulatory Visit: Payer: Self-pay

## 2019-07-29 ENCOUNTER — Encounter: Payer: Self-pay | Admitting: Registered"

## 2019-07-29 DIAGNOSIS — R7303 Prediabetes: Secondary | ICD-10-CM | POA: Insufficient documentation

## 2019-07-29 DIAGNOSIS — E669 Obesity, unspecified: Secondary | ICD-10-CM

## 2019-07-29 DIAGNOSIS — I10 Essential (primary) hypertension: Secondary | ICD-10-CM | POA: Insufficient documentation

## 2019-07-29 NOTE — Patient Instructions (Addendum)
Instructions/Goals:  Make sure to get in three meals per day. Try to have balanced meals like the My Plate example (see handout). Include lean proteins, vegetables, fruits, and whole grains at meals.   Goal: Have 3 meals per day/eat every 3-5 hours per day   Recommend trying a Special K Protein cereal   Include heart healthy unsaturated fats and less saturated fat (see handout)  Recipes: SecurityAd.es  Make physical activity a part of your week. Try to include at least 30 minutes of physical activity 5 days each week or at least 150 minutes per week. Regular physical activity promotes overall health-including helping to reduce risk for heart disease and diabetes, promoting mental health, and helping [place] sleep better.    Starting Goal: Include walking 15 minutes x 3 days per week and gradually add more time and/or days of walking.

## 2019-07-29 NOTE — Progress Notes (Signed)
Medical Nutrition Therapy:  Appt start time: 0845 end time:  0915.  Assessment:  Primary concerns today: Pt referred due to hyperglycemia, weight management, and HTN. Pt has been dx with prediabetes. Pt present for appointment alone.   Pt reports her main goal for today is to learn how to eat healthy and lose weight. Reports she wants to lose weight in her abdomen. Pt reports she is willing to become vegan if it is needed to lose weight. Reports she has been told a lot of different things about foods such as meats, dairy, and eggs not being good for her health. 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 Pt reports she is unsure what to eat for her health.    Pt was prescribed Rybelsus but discontinued due to side effects per pt. Pt reports her doctor is aware.   Food Allergies/Intolerances: Shrimp: anaphylaxis. Reports 50 year old daughter is allergic to nuts.  GI Concerns: Acid reflux if eating tomato sauce, red meats. Reports some constipation.   Pertinent Lab Values: 04/09/19: Triglycerides: 190 HDL Cholesterol: 27 Chol/HDLC Ratio: 5.4 HgbA1c: 6.1 Hemoglobin: 11.5 (L) MCV: 74.8 (L) MCH: 24.0 (L)  Preferred Learning Style:   No preference indicated   Learning Readiness:   Ready  MEDICATIONS: Reviewed. See list.    DIETARY INTAKE:  Usual eating pattern includes 2 meals (lunch and dinner) and 2 snacks per day. Reports she skips breakfast often due to not being a breakfast person. Reports she is often on the go and doesn't know what she should eat for breakfast. Reports she was eating Frosted Flakes but was told milk was not good. Reports she was also told eggs and yogurt are not good.   Common foods: green beans, kale/greens with smoked [place].  Avoided foods: shrimp due to allergy.    Typical Snacks: Cheez Its, sunflower seeds, fruit cups, chips, microwavable [place]  popcorn.     Typical Beverages: 3-4 bottles x 16 oz water, zero calorie Powerade, .   Location of Meals: table in living room.   Electronics Present at [employer]: Yes: TV   24-hr recall:  B (10 AM): 1 hot dog with bun with chili, cheese, BBQ chips, water Snk ( AM): None reported.  L (1 PM): Cookout cheeseburger, onion rings, hush puppies, orange juice  Snk ( PM): None reported.  D (10 PM): Chinese: pork with fried rice and vegetables, chicken wings x 3, water  Snk ( PM): None reported.  Beverages: water, orange juice   Usual physical activity: None reported. Minutes/Week: N/A. Reports she used to go walking with daughter around neighborhood and at [place] and in the past went to the [facility]. Pt reports struggling with trying to walk 30 minutes at one time.   Progress Towards Goal(s):  In progress.   Nutritional Diagnosis:  NB-1.1 Food and nutrition-related knowledge deficit As related to no prior education with dietitian .  As evidenced by pt has many questions regarding what foods are healthy and acceptable for her to eat; pt reports skipping breakfast in part due to uncertainty about what she should eat .    Intervention:  Nutrition counseling provided. Dietitian provided education regarding balanced and heart healthy nutrition. Discussed pt's labs and provided education regarding relationship between blood sugar/insulin resistance, dietary intake and physical activity. Discussed how anyone can make videos or post information online about nutrition, whereas only those with  extensive education and who have passed a registration exam can practice as a dietitian. Discussed importance of focusing on balanced eating and healthy habits rather than weight to promote health of the whole body. Provided education on label reading for heart health and also to avoid food allergies. Worked with pt to set goals. Advised pt to let her doctor know about any medications she is no longer taking. Pt appeared  agreeable to information/goals discussed.   Instructions/Goals:  Make sure to get in three meals per day. Try to have balanced meals like the My Plate example (see handout). Include lean proteins, vegetables, fruits, and whole grains at meals.   Goal: Have 3 meals per day/eat every 3-5 hours per day   Recommend trying a Special K Protein cereal   Include heart healthy unsaturated fats and less saturated fat (see handout)  Recipes: SecurityAd.es  Make physical activity a part of your week. Try to include at least 30 minutes of physical activity 5 days each week or at least 150 minutes per week. Regular physical activity promotes overall health-including helping to reduce risk for heart disease and diabetes, promoting mental health, and helping [place] sleep better.    Starting Goal: Include walking 15 minutes x 3 days per week and gradually add more time and/or days of walking.   Teaching Method Utilized:  Visual Auditory Hands on  Handouts given during visit include:  Balanced plate and food list.   Heart Healthy Nutrition Therapy   Barriers to learning/adherence to lifestyle change: None reported.   Demonstrated degree of understanding via:  Teach Back   Monitoring/Evaluation:  Dietary intake, exercise, and body weight in 1 month(s).

## 2019-08-04 ENCOUNTER — Encounter (HOSPITAL_COMMUNITY): Payer: Self-pay

## 2019-08-04 ENCOUNTER — Emergency Department (HOSPITAL_COMMUNITY)
Admission: EM | Admit: 2019-08-04 | Discharge: 2019-08-05 | Disposition: A | Discharge status: Home / Self Care | Payer: Medicaid Other | Attending: Emergency Medicine | Admitting: Emergency Medicine

## 2019-08-04 ENCOUNTER — Emergency Department (HOSPITAL_COMMUNITY): Payer: Medicaid Other

## 2019-08-04 ENCOUNTER — Other Ambulatory Visit: Payer: Self-pay

## 2019-08-04 DIAGNOSIS — Z5321 Procedure and treatment not carried out due to patient leaving prior to being seen by health care provider: Secondary | ICD-10-CM | POA: Diagnosis not present

## 2019-08-04 DIAGNOSIS — R519 Headache, unspecified: Secondary | ICD-10-CM | POA: Diagnosis not present

## 2019-08-04 LAB — COMPREHENSIVE METABOLIC PANEL
ALT: 16 U/L (ref 0–44)
AST: 15 U/L (ref 15–41)
Albumin: 4.1 g/dL (ref 3.5–5.0)
Alkaline Phosphatase: 37 U/L — ABNORMAL LOW (ref 38–126)
Anion gap: 13 (ref 5–15)
BUN: 19 mg/dL (ref 6–20)
CO2: 26 mmol/L (ref 22–32)
Calcium: 9.8 mg/dL (ref 8.9–10.3)
Chloride: 100 mmol/L (ref 98–111)
Creatinine, Ser: 0.85 mg/dL (ref 0.44–1.00)
GFR calc Af Amer: >60 mL/min (ref >60)
GFR calc non Af Amer: >60 mL/min (ref >60)
Glucose, Bld: 114 mg/dL — ABNORMAL HIGH (ref 70–99)
Potassium: 4 mmol/L (ref 3.5–5.1)
Sodium: 139 mmol/L (ref 135–145)
Total Bilirubin: 0.3 mg/dL (ref 0.3–1.2)
Total Protein: 8 g/dL (ref 6.5–8.1)

## 2019-08-04 LAB — DIFFERENTIAL
Abs Immature Granulocytes: 0.04 10*3/uL (ref 0.00–0.07)
Basophils Absolute: 0 10*3/uL (ref 0.0–0.1)
Basophils Relative: 0 %
Eosinophils Absolute: 0.1 10*3/uL (ref 0.0–0.5)
Eosinophils Relative: 2 %
Immature Granulocytes: 1 %
Lymphocytes Relative: 30 %
Lymphs Abs: 2.3 10*3/uL (ref 0.7–4.0)
Monocytes Absolute: 0.5 10*3/uL (ref 0.1–1.0)
Monocytes Relative: 6 %
Neutro Abs: 4.6 10*3/uL (ref 1.7–7.7)
Neutrophils Relative %: 61 %

## 2019-08-04 LAB — I-STAT BETA HCG BLOOD, ED (MC, WL, AP ONLY): I-stat hCG, quantitative: <5 m[IU]/mL (ref <5)

## 2019-08-04 LAB — CBC
HCT: 37.6 % (ref 36.0–46.0)
Hemoglobin: 11.5 g/dL — ABNORMAL LOW (ref 12.0–15.0)
MCH: 24.1 pg — ABNORMAL LOW (ref 26.0–34.0)
MCHC: 30.6 g/dL (ref 30.0–36.0)
MCV: 78.8 fL — ABNORMAL LOW (ref 80.0–100.0)
Platelets: 349 10*3/uL (ref 150–400)
RBC: 4.77 MIL/uL (ref 3.87–5.11)
RDW: 15.4 % (ref 11.5–15.5)
WBC: 7.5 10*3/uL (ref 4.0–10.5)
nRBC: 0 % (ref 0.0–0.2)

## 2019-08-04 MED ORDER — SODIUM CHLORIDE 0.9% FLUSH
3.0000 mL | Freq: Once | INTRAVENOUS | Status: DC
Start: 2019-08-04 — End: 2019-08-05

## 2019-08-04 NOTE — ED Triage Notes (Signed)
Pt arrives to ED w/ c/o headache x 4 days. Pt reports 8/10 pain. NIHSS 0.

## 2019-08-05 NOTE — ED Notes (Signed)
Called for pt x3, no response. 

## 2019-08-06 ENCOUNTER — Ambulatory Visit: Payer: Medicaid Other | Attending: Internal Medicine

## 2019-08-06 DIAGNOSIS — Z23 Encounter for immunization: Secondary | ICD-10-CM

## 2019-08-06 NOTE — Progress Notes (Signed)
   Covid-19 Vaccination Clinic  Name:  KIMIRA COLONNA    MRN: [id number] DOB: [birth year]  08/06/2019  Ms. Erdely was observed post Covid-19 immunization for 15 minutes without incident. She was provided with Vaccine Information Sheet and instruction to access the V-Safe system.   Ms. Hartz was instructed to call 911 with any severe reactions post vaccine: Marland Kitchen Difficulty breathing  . Swelling of face and throat  . A fast heartbeat  . A bad rash all over body  . Dizziness and weakness   Immunizations Administered    Name Date Dose VIS Date Route   Pfizer COVID-19 Vaccine 08/06/2019  8:22 AM 0.3 mL 05/13/2018 Intramuscular   Manufacturer: [place]   Lot: [id number]   [place]: [id number]

## 2019-09-02 ENCOUNTER — Encounter (HOSPITAL_COMMUNITY): Payer: Self-pay | Admitting: Emergency Medicine

## 2019-09-02 ENCOUNTER — Ambulatory Visit (HOSPITAL_COMMUNITY)
Admission: EM | Admit: 2019-09-02 | Discharge: 2019-09-02 | Disposition: A | Discharge status: Home / Self Care | Payer: Medicaid Other | Attending: Urgent Care | Admitting: Urgent Care

## 2019-09-02 ENCOUNTER — Other Ambulatory Visit: Payer: Self-pay

## 2019-09-02 DIAGNOSIS — Z8249 Family history of ischemic heart disease and other diseases of the circulatory system: Secondary | ICD-10-CM | POA: Diagnosis not present

## 2019-09-02 DIAGNOSIS — I10 Essential (primary) hypertension: Secondary | ICD-10-CM | POA: Insufficient documentation

## 2019-09-02 DIAGNOSIS — Z86018 Personal history of other benign neoplasm: Secondary | ICD-10-CM | POA: Diagnosis not present

## 2019-09-02 DIAGNOSIS — R05 Cough: Secondary | ICD-10-CM | POA: Insufficient documentation

## 2019-09-02 DIAGNOSIS — Z833 Family history of diabetes mellitus: Secondary | ICD-10-CM | POA: Diagnosis not present

## 2019-09-02 DIAGNOSIS — Z7984 Long term (current) use of oral hypoglycemic drugs: Secondary | ICD-10-CM | POA: Insufficient documentation

## 2019-09-02 DIAGNOSIS — Z803 Family history of malignant neoplasm of breast: Secondary | ICD-10-CM | POA: Diagnosis not present

## 2019-09-02 DIAGNOSIS — J4530 Mild persistent asthma, uncomplicated: Secondary | ICD-10-CM | POA: Insufficient documentation

## 2019-09-02 DIAGNOSIS — E119 Type 2 diabetes mellitus without complications: Secondary | ICD-10-CM | POA: Diagnosis not present

## 2019-09-02 DIAGNOSIS — Z853 Personal history of malignant neoplasm of breast: Secondary | ICD-10-CM | POA: Diagnosis not present

## 2019-09-02 DIAGNOSIS — Z90722 Acquired absence of ovaries, bilateral: Secondary | ICD-10-CM | POA: Diagnosis not present

## 2019-09-02 DIAGNOSIS — Z886 Allergy status to analgesic agent status: Secondary | ICD-10-CM | POA: Insufficient documentation

## 2019-09-02 DIAGNOSIS — Z20822 Contact with and (suspected) exposure to covid-19: Secondary | ICD-10-CM | POA: Insufficient documentation

## 2019-09-02 DIAGNOSIS — R0989 Other specified symptoms and signs involving the circulatory and respiratory systems: Secondary | ICD-10-CM | POA: Insufficient documentation

## 2019-09-02 DIAGNOSIS — R059 Cough, unspecified: Secondary | ICD-10-CM

## 2019-09-02 DIAGNOSIS — Z791 Long term (current) use of non-steroidal anti-inflammatories (NSAID): Secondary | ICD-10-CM | POA: Diagnosis not present

## 2019-09-02 DIAGNOSIS — Z79899 Other long term (current) drug therapy: Secondary | ICD-10-CM | POA: Diagnosis not present

## 2019-09-02 DIAGNOSIS — R0981 Nasal congestion: Secondary | ICD-10-CM

## 2019-09-02 MED ORDER — PROMETHAZINE-DM 6.25-15 MG/5ML PO SYRP
5.0000 mL | ORAL_SOLUTION | Freq: Every evening | ORAL | 0 refills | Status: DC | PRN
Start: 2019-09-02 — End: 2019-09-21

## 2019-09-02 MED ORDER — ALBUTEROL SULFATE HFA 108 (90 BASE) MCG/ACT IN AERS: 2.0000 | INHALATION_SPRAY | Freq: Four times a day (QID) | RESPIRATORY_TRACT | 0 refills | Status: DC | PRN

## 2019-09-02 MED ORDER — PREDNISONE 20 MG PO TABS
ORAL_TABLET | ORAL | 0 refills | Status: DC
Start: 2019-09-02 — End: 2019-09-11

## 2019-09-02 MED ORDER — BENZONATATE 100 MG PO CAPS: 100.0000 mg | ORAL_CAPSULE | Freq: Three times a day (TID) | ORAL | 0 refills | Status: DC | PRN

## 2019-09-02 NOTE — ED Triage Notes (Addendum)
Patient reports she is coughing and has congestion, patient has phlegm for one week.  Patient is sob.  Patient has asthma.  Patient thinks this may be asthma exacerbation.  Patient has used albuterol, hot tea with lemon-remedies do help symptoms. Denies fever, no nausea, no vomiting

## 2019-09-02 NOTE — ED Provider Notes (Signed)
[facility]   MRN: [id number] DOB: April 29, 1970  Subjective:   Sheryl Cox is a 49 y.o. female presenting for 1 week history of persistent nasal congestion, chest congestion, dry hacking cough, coughing fits.  Patient has had intermittent shortness of breath.  Unfortunately she has run out of her inhalers, has been using her daughter's albuterol and Qvar.  She has not follow-up with her regular doctor.  Denies fever, sinus pain, ear pain, throat pain, chest pain, body aches.  Patient is coated vaccinated, completed her second dose in April.  No current facility-administered medications for this encounter.  Current Outpatient Medications:  .  ALPRAZolam (XANAX) 0.25 MG tablet, TAKE 1 TABLET BY MOUTH TWICE A DAY AS NEEDED FOR ANXIETY, Disp: , Rfl:  .  amLODipine (NORVASC) 10 MG tablet, Take 10 mg by mouth daily., Disp: , Rfl: 3 .  Ascorbic Acid (VITAMIN C PO), Take by mouth., Disp: , Rfl:  .  ELDERBERRY PO, Take by mouth., Disp: , Rfl:  .  fluticasone (FLONASE) 50 MCG/ACT nasal spray, Place 2 sprays into both nostrils daily., Disp: 1 g, Rfl: 0 .  hydrochlorothiazide (HYDRODIURIL) 12.5 MG tablet, Take 2 tablets (25 mg total) by mouth daily. (Patient taking differently: Take 12.5 mg by mouth daily. ), Disp: 30 tablet, Rfl: 3 .  meloxicam (MOBIC) 15 MG tablet, Take 1 tablet (15 mg total) by mouth daily., Disp: 30 tablet, Rfl: 0 .  metFORMIN (GLUCOPHAGE) 500 MG tablet, Take 1 tablet (500 mg total) by mouth 2 (two) times daily with a meal., Disp: 60 tablet, Rfl: 2 .  Multiple Vitamins-Minerals (MULTIVITAMIN PO), Take 1 tablet by mouth daily., Disp: , Rfl:  .  tamoxifen (NOLVADEX) 20 MG tablet, Take 1 tablet (20 mg total) by mouth daily., Disp: 90 tablet, Rfl: 3 .  acetaminophen (TYLENOL) 325 MG tablet, Take 650 mg by mouth daily as needed for headache., Disp: , Rfl:  .  albuterol (PROAIR HFA) 108 (90 Base) MCG/ACT inhaler, Inhale 2 puffs into the lungs every 6 (six) hours as needed for  shortness of breath., Disp: 18 g, Rfl: 1 .  atorvastatin (LIPITOR) 10 MG tablet, Take 1 tablet (10 mg total) by mouth daily at 6 PM. (Patient not taking: Reported on 07/29/2019), Disp: 90 tablet, Rfl: 3 .  cetirizine (ZYRTEC) 10 MG tablet, Take 1 tablet (10 mg total) by mouth daily., Disp: 30 tablet, Rfl: 11   Allergies  Allergen Reactions  . Shrimp [Shellfish Allergy] Anaphylaxis and Swelling  . Aleve [Naproxen] Hypertension    MD told the patient to not take this because it will elevate her B/P    Past Medical History:  Diagnosis Date  . Anemia    history of blood transfusion-no abnormal reaction noted  . Asthma    Albuterol as needed  . 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 Diabetes mellitus without complication ([place])    takes Metformin daily  . History of migraine    several yrs ago  . Hypertension    takes Tribenzor daily     Past Surgical History:  Procedure Laterality Date  . BIOPSY BREAST Left 12/05/12   fibroadenoma  . BREAST LUMPECTOMY     2014  . BREAST SURGERY Right   . CESAREAN SECTION  01/05/2009  . CHOLECYSTECTOMY N/A 01/31/2016   Procedure: LAPAROSCOPIC CHOLECYSTECTOMY;  Surgeon: Erroll Luna, MD;  Location: [facility];  Service: General;  Laterality: N/A;  . DILITATION & CURRETTAGE/HYSTROSCOPY WITH HYDROTHERMAL ABLATION N/A  08/12/2013   Procedure: DILATATION & CURETTAGE/HYSTEROSCOPY WITH HYDROTHERMAL ABLATION;  Surgeon: Frederico Hamman, MD;  Location: [facility];  Service: Gynecology;  Laterality: N/A;  . ROBOTIC ASSISTED TOTAL HYSTERECTOMY N/A 06/18/2017   Procedure: XI ROBOTIC ASSISTED TOTAL HYSTERECTOMY;  Surgeon: Lavonia Drafts, MD;  Location: [facility];  Service: Gynecology;  Laterality: N/A;  . SALPINGOOPHORECTOMY Bilateral 06/18/2017   Procedure: BILATERAL SALPINGO OOPHORECTOMY;  Surgeon: Lavonia Drafts, MD;  Location: [facility];  Service: Gynecology;  Laterality: Bilateral;    Family History  Problem Relation Age of  Onset  . Hypertension Mother   . Diabetes Mother   . COPD Mother   . Breast cancer Paternal Grandmother        dx in her 84s  . Prostate cancer Paternal Grandfather   . Prostate cancer Father 71  . Prostate cancer Paternal Uncle   . Breast cancer Other        paternal grandmother's sister  . Breast cancer Paternal Aunt   . Anesthesia problems Neg Hx   . Amblyopia Neg Hx   . Blindness Neg Hx   . Cataracts Neg Hx   . Glaucoma Neg Hx   . Macular degeneration Neg Hx   . Retinal detachment Neg Hx   . Strabismus Neg Hx   . Retinitis pigmentosa Neg Hx     Social History   Tobacco Use  . Smoking status: Never Smoker  . Smokeless tobacco: Never Used  Vaping Use  . Vaping Use: Never used  Substance Use Topics  . Alcohol use: Yes    Alcohol/week: 1.0 standard drink    Types: 1 Cans of beer per week    Comment: occa  . Drug use: No    ROS   Objective:   Vitals: BP (!) 157/94 (BP Location: Right Arm) Comment (BP Location): large cuff  Pulse (!) 102   Temp 99.1 F (37.3 C) (Oral)   Resp (!) 22   LMP 05/27/2017 (Approximate)   SpO2 100%   Pulse recheck was 89-91.   Physical Exam Constitutional:      General: She is not in acute distress.    Appearance: Normal appearance. She is well-developed. She is not ill-appearing, toxic-appearing or diaphoretic.  HENT:     Head: Normocephalic and atraumatic.     Nose: Nose normal.     Mouth/Throat:     Mouth: Mucous membranes are moist.     Pharynx: Oropharynx is clear.  Eyes:     General: No scleral icterus.       Right eye: No discharge.        Left eye: No discharge.     Extraocular Movements: Extraocular movements intact.     Conjunctiva/sclera: Conjunctivae normal.     Pupils: Pupils are equal, round, and reactive to light.  Cardiovascular:     Rate and Rhythm: Normal rate and regular rhythm.     Pulses: Normal pulses.     Heart sounds: Normal heart sounds. No murmur heard.  No friction rub. No gallop.     Pulmonary:     Effort: Pulmonary effort is normal. No respiratory distress.     Breath sounds: No stridor. No wheezing, rhonchi or rales.     Comments: Slightly decreased breath sounds. Skin:    General: Skin is warm and dry.     Findings: No rash.  Neurological:     Mental Status: She is alert and oriented to person, place, and time.  Psychiatric:  Mood and Affect: Mood normal.        Behavior: Behavior normal.        Thought Content: Thought content normal.        Judgment: Judgment normal.     Assessment and Plan :   PDMP not reviewed this encounter.  1. Mild persistent asthma without complication   2. Cough   3. Nasal congestion   4. Chest congestion     COVID-19 testing pending.  Will manage for her asthma.  Advised against using her daughter's inhalers.  Refilled her asthma inhaler.  Start prednisone course.  Maintain all other chronic medications.  Low risk factors for chest PE.  Use supportive care otherwise.  Counseled patient on potential for adverse effects with medications prescribed/recommended today, ER and return-to-clinic precautions discussed, patient verbalized understanding.    Jaynee Eagles, PA-C 09/03/19 [id number]

## 2019-09-03 LAB — SARS CORONAVIRUS 2 (TAT 6-24 HRS): SARS Coronavirus 2: NEGATIVE

## 2019-09-07 ENCOUNTER — Encounter: Payer: Medicaid Other | Attending: Family Medicine | Admitting: Registered"

## 2019-09-07 ENCOUNTER — Other Ambulatory Visit: Payer: Self-pay

## 2019-09-07 ENCOUNTER — Encounter: Payer: Self-pay | Admitting: Registered"

## 2019-09-07 DIAGNOSIS — R739 Hyperglycemia, unspecified: Secondary | ICD-10-CM

## 2019-09-07 DIAGNOSIS — I10 Essential (primary) hypertension: Secondary | ICD-10-CM | POA: Insufficient documentation

## 2019-09-07 DIAGNOSIS — R7303 Prediabetes: Secondary | ICD-10-CM | POA: Diagnosis present

## 2019-09-07 DIAGNOSIS — E669 Obesity, unspecified: Secondary | ICD-10-CM | POA: Insufficient documentation

## 2019-09-07 NOTE — Progress Notes (Signed)
Medical Nutrition Therapy:  Appt start time: 0915 end time:  0945.  Assessment:  Primary concerns today: Pt referred due to hyperglycemia, weight management, and HTN. Pt has been dx with prediabetes. Nutrition Follow-Up: Pt present for appointment alone.   Pt reports she has no idea how things are going. Reports trying for 3 meals per day but it hasn't been every day. Reports time makes this hard. Reports planning ahead might help her. Current schedule includes going to work by 8 AM, break at [employer] at 12, break at 2 PM, 430 picks up child by 5 PM. Pt reports she was told not to eat after 5 PM before so it has been hard for her to get in dinner. Pt reports not getting breakfast most days (today had cheese and pretzels for breakfast), always has lunch and has 2-3 snacks per day. Reports taking fruit cups for snacks. Pt reports often eating grapefruit.   Pt reports she has been eating more vegetables-cauliflower, broccoli, spinach. 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 Reports this includes a lot of walking. Reports did some walking on days off during weekend with daughter.   Pt still taking Metformin. Pt reports she just finished a round of prednisone. Pt reports retaining more fluid than usual lately. Reports she talked with her doctor about this and was told she can take an additional fluid pill.   Food Allergies/Intolerances: Shrimp: anaphylaxis. Reports 61 year old daughter is allergic to nuts.  GI Concerns: Acid reflux if eating tomato sauce, red meats. Reports still having some constipation.   Pertinent Lab Values: 04/09/19: Triglycerides: 190 HDL Cholesterol: 27 Chol/HDLC Ratio: 5.4 HgbA1c: 6.1 Hemoglobin: 11.5 (L) MCV: 74.8 (L) MCH: 24.0 (L)  Preferred Learning Style:   No preference indicated   Learning Readiness:   Ready  MEDICATIONS: Reviewed. See list.    DIETARY INTAKE:  Usual eating pattern includes 1-2 meals and 2 snacks per day.   Common foods: green beans,  kale/greens with smoked [place].  Avoided foods: shrimp due to allergy.    Typical Snacks: Cheez Its, sunflower seeds, fruit cups, chips, microwavable [place] popcorn.     Typical Beverages: 3 bottles x 16 oz water, zero calorie Powerade.   Location of Meals: table in living room.   Electronics Present at [employer]: Yes: TV   24-hr recall:  B (8 AM): egg and cheese sandwich, orange juice  Snk ( AM): None reported.  L ( PM): None reported.  Snk ( PM): None reported.  D (4-5 PM): [employer] fried chicken sandwich with lettuce, tomatoes, pickles, bottle water  Snk ( PM): None reported.  Beverages: 3 bottles water, orange juice  Usual physical activity: Been going to the park and walking downtown. Minutes/Week: 2 days over past week.  Progress Towards Goal(s):  Some progress.   Nutritional Diagnosis:  NB-1.1 Food and nutrition-related knowledge deficit As related to no prior education with dietitian .  As evidenced by pt has many questions regarding what foods are healthy and acceptable for her to eat; pt reports skipping breakfast in part due to uncertainty about what she should eat .    Intervention:  Nutrition counseling provided. Dietitian praised pt's progress with increasing vegetable intake. Discussed strategies for getting in breakfast and that dinner does not have to be by 5 PM, optimal time between 5-7 PM. Recommended avoiding grapefruit due to possible interactions with medications-recommend replacing with another fruit instead. Encouraged pt to continue with increasing walking on weekends. Pt appeared agreeable to information/goals  discussed.   Instructions/Goals: Try to include 3 meals per day:   Quick Breakfast Ideas:  [place] yogurt may add fruit   Whole grain cracker with Sunbutter OR low fat cheese may add fruit   Banana or apple with Sunbutter OR boiled eggs  Whole grain crackers or bread, boiled eggs, fruit  Recommend having dinner between 5-7 PM. Try to avoid eating  within 3 hours of going to sleep.   Good job getting in more vegetables!  For heart health, look for foods low in saturated fat and sodium:   5% or less = low and 20% or more = high  Recommend avoiding grapefruit due to risk of medication interaction.   Recommend including at least 4 bottles of water (64 oz) daily.  Make physical activity a part of your week. Try to include at least 30 minutes of physical activity 5 days each week or at least 150 minutes per week. Regular physical activity promotes overall health-including helping to reduce risk for heart disease and diabetes, promoting mental health, and helping [place] sleep better.    Starting Goal: Continue working toward 3 days activity per week. Good job adding more activity!  Teaching Method Utilized:  Visual Auditory Hands on  Handouts given during visit include:  Balanced plate and food list.   Heart Healthy Nutrition Therapy   Barriers to learning/adherence to lifestyle change: None reported.   Demonstrated degree of understanding via:  Teach Back   Monitoring/Evaluation:  Dietary intake, exercise, and body weight in 1 month(s).

## 2019-09-07 NOTE — Patient Instructions (Addendum)
Instructions/Goals:   Try to include 3 meals per day:   Quick Breakfast Ideas:  [place] yogurt may add fruit   Whole grain cracker with Sunbutter OR low fat cheese may add fruit   Banana or apple with Sunbutter OR boiled eggs  Whole grain crackers or bread, boiled eggs, fruit  Recommend having dinner between 5-7 PM. Try to avoid eating within 3 hours of going to sleep.   Good job getting in more vegetables!  For heart health, look for foods low in saturated fat and sodium:   5% or less = low and 20% or more = high  Recommend avoiding grapefruit due to risk of medication interaction.   Recommend including at least 4 bottles of water (64 oz) daily.  Make physical activity a part of your week. Try to include at least 30 minutes of physical activity 5 days each week or at least 150 minutes per week. Regular physical activity promotes overall health-including helping to reduce risk for heart disease and diabetes, promoting mental health, and helping [place] sleep better.    Starting Goal: Continue working toward 3 days activity per week. Good job adding more activity!

## 2019-09-10 NOTE — Progress Notes (Signed)
Patient Care Team: Lucianne Lei, MD as PCP - General (Family Medicine) Elouise Munroe, MD as PCP - Cardiology (Cardiology)  DIAGNOSIS:    ICD-10-CM   1. Ductal carcinoma in situ (DCIS) of right breast  D05.11     SUMMARY OF ONCOLOGIC HISTORY: Oncology History  Ductal carcinoma in situ (DCIS) of right breast  11/06/2012 Mammogram    Numerous calcifications appear both linear in morphology and linear in distribution and extend over a large portion of the breast.  The longest area is 6.0 x 1.2 cm.  A second area is 6.0 x 1.4 cm.  Also closer to the nipple, two discrete calcifications   11/21/2012 Initial Diagnosis   Breast cancer of upper-outer quadrant of right female breast; ER 100% PR 100% DCIS with calcifications based on biopsy of the right breast.   11/26/2012 Breast MRI   upper inner quadrant of the posterior left breast, there is an 8 x 7 x 7 mm circumscribed enhancing mass;    03/25/2013 - 05/02/2013 Radiation Therapy   50 Gy to the right breast    05/19/2013 -  Anti-estrogen oral therapy   Tamoxifen 20 mg daily   02/23/2014 Surgery   Right Lumpectomy: At [facility]; DCIS, ER/PR Positive   06/26/2017 Surgery   Hysterectomy with BSO     CHIEF COMPLIANT: Follow-up of right breast DCIS on tamoxifen  INTERVAL HISTORY: Sheryl Cox is a 49 y.o. with above-mentioned history of right breast DCIS who underwent lumpectomy and is currently on tamoxifen. Mammogram on 04/09/19 showed no evidence of malignancy bilaterally. She presents to the clinic today for annual follow-up.   She is tolerating tamoxifen extremely well.  She does not have any side effects.  She had mammograms done in January at [facility] and they were normal.  She denies any lumps or nodules in the breast.  She feels fairly healthy.  She has not been exercising as much.  She has been trying to walk more.  ALLERGIES:  is allergic to shrimp [shellfish allergy] and aleve [naproxen].  MEDICATIONS:    Current Outpatient Medications  Medication Sig Dispense Refill   acetaminophen (TYLENOL) 325 MG tablet Take 650 mg by mouth daily as needed for headache.     albuterol (PROAIR HFA) 108 (90 Base) MCG/ACT inhaler Inhale 2 puffs into the lungs every 6 (six) hours as needed for shortness of breath. 18 g 0   ALPRAZolam (XANAX) 0.25 MG tablet TAKE 1 TABLET BY MOUTH TWICE A DAY AS NEEDED FOR ANXIETY     amLODipine (NORVASC) 10 MG tablet Take 10 mg by mouth daily.  3   Ascorbic Acid (VITAMIN C PO) Take by mouth.     atorvastatin (LIPITOR) 10 MG tablet Take 1 tablet (10 mg total) by mouth daily at 6 PM. (Patient not taking: Reported on 07/29/2019) 90 tablet 3   benzonatate (TESSALON) 100 MG capsule Take 1-2 capsules (100-200 mg total) by mouth 3 (three) times daily as needed. 60 capsule 0   cetirizine (ZYRTEC) 10 MG tablet Take 1 tablet (10 mg total) by mouth daily. 30 tablet 11   ELDERBERRY PO Take by mouth.     fluticasone (FLONASE) 50 MCG/ACT nasal spray Place 2 sprays into both nostrils daily. 1 g 0   hydrochlorothiazide (HYDRODIURIL) 12.5 MG tablet Take 2 tablets (25 mg total) by mouth daily. (Patient taking differently: Take 12.5 mg by mouth daily. ) 30 tablet 3   meloxicam (MOBIC) 15 MG tablet Take 1 tablet (  15 mg total) by mouth daily. 30 tablet 0   metFORMIN (GLUCOPHAGE) 500 MG tablet Take 1 tablet (500 mg total) by mouth 2 (two) times daily with a meal. 60 tablet 2   Multiple Vitamins-Minerals (MULTIVITAMIN PO) Take 1 tablet by mouth daily.     predniSONE (DELTASONE) 20 MG tablet Take 2 tablets daily with breakfast. 10 tablet 0   promethazine-dextromethorphan (PROMETHAZINE-DM) 6.25-15 MG/5ML syrup Take 5 mLs by mouth at bedtime as needed for cough. 100 mL 0   tamoxifen (NOLVADEX) 20 MG tablet Take 1 tablet (20 mg total) by mouth daily. 90 tablet 3   No current facility-administered medications for this visit.    PHYSICAL EXAMINATION: ECOG PERFORMANCE STATUS: 0 -  Asymptomatic  Vitals:   09/11/19 0831  BP: 128/69  Pulse: (!) 101  Resp: 17  Temp: 98.9 F (37.2 C)  SpO2: 100%   Filed Weights   09/11/19 0831  Weight: 252 lb 14.4 oz (114.7 kg)    BREAST: No palpable masses or nodules in either right or left breasts.  Breast reductions and scar tissue palpated.  No palpable axillary supraclavicular or infraclavicular adenopathy no breast tenderness or nipple discharge. (exam performed in the presence of a chaperone)  LABORATORY DATA:  I have reviewed the data as listed CMP Latest Ref Rng & Units 08/04/2019 04/16/2019 08/13/2018  Glucose 70 - 99 mg/dL 114(H) 105(H) 118(H)  BUN 6 - 20 mg/dL 19 12 15   Creatinine 0.44 - 1.00 mg/dL 0.85 0.72 0.78  Sodium 135 - 145 mmol/L 139 139 136  Potassium 3.5 - 5.1 mmol/L 4.0 3.6 3.4(L)  Chloride 98 - 111 mmol/L 100 102 99  CO2 22 - 32 mmol/L 26 27 26   Calcium 8.9 - 10.3 mg/dL 9.8 9.3 9.9  Total Protein 6.5 - 8.1 g/dL 8.0 - -  Total Bilirubin 0.3 - 1.2 mg/dL 0.3 - -  Alkaline Phos 38 - 126 U/L 37(L) - -  AST 15 - 41 U/L 15 - -  ALT 0 - 44 U/L 16 - -    Lab Results  Component Value Date   WBC 7.5 08/04/2019   HGB 11.5 (L) 08/04/2019   HCT 37.6 08/04/2019   MCV 78.8 (L) 08/04/2019   PLT 349 08/04/2019   NEUTROABS 4.6 08/04/2019    ASSESSMENT & PLAN:  Ductal carcinoma in situ (DCIS) of right breast Right breast DCIS ER/PR positive status post lumpectomy at [facility] currently on tamoxifen 10 mg daily March 2015  Tamoxifen Toxicities: Denies side effects of tamoxifen therapy. Patient wishes to stay on antiestrogen therapy with tamoxifen for 10 years even though data for DCIS is only up to 5. Patient understands risks and benefits.  Hypertension: On blood pressure medication. Follows with her primary care physician. 2019 total hysterectomy with bilateral salpingo-oophorectomy.  Surveillance:  [facility]amm[facility]: Benign. She also follows up with her surgeon and with  radiation oncologist. Breast exam 09/11/2019: Benign  History of microcytic anemia:She follows with her primary care physician. Obesity:  Once again we discussed about obesity and risk factor for breast cancer.  Encouraged her to stay more active and watch her diet.  Return to clinic in 1 year for follow-up     No orders of the defined types were placed in this encounter.  The patient has a good understanding of the overall plan. she agrees with it. she will call with any problems that may develop before the next visit here.  Total time spent: 20-given mins including face  to face time and time spent for planning, charting and coordination of care  Nicholas Lose, MD 09/11/2019  I, Cloyde Reams Dorshimer, am acting as scribe for Dr. Nicholas Lose.  I have reviewed the above documentation for accuracy and completeness, and I agree with the above.

## 2019-09-11 ENCOUNTER — Other Ambulatory Visit: Payer: Self-pay

## 2019-09-11 ENCOUNTER — Telehealth: Payer: Self-pay | Admitting: Hematology and Oncology

## 2019-09-11 ENCOUNTER — Inpatient Hospital Stay: Payer: Medicaid Other | Attending: Hematology and Oncology | Admitting: Hematology and Oncology

## 2019-09-11 DIAGNOSIS — Z90722 Acquired absence of ovaries, bilateral: Secondary | ICD-10-CM | POA: Diagnosis not present

## 2019-09-11 DIAGNOSIS — I10 Essential (primary) hypertension: Secondary | ICD-10-CM | POA: Insufficient documentation

## 2019-09-11 DIAGNOSIS — Z79810 Long term (current) use of selective estrogen receptor modulators (SERMs): Secondary | ICD-10-CM | POA: Insufficient documentation

## 2019-09-11 DIAGNOSIS — E669 Obesity, unspecified: Secondary | ICD-10-CM | POA: Insufficient documentation

## 2019-09-11 DIAGNOSIS — Z90710 Acquired absence of both cervix and uterus: Secondary | ICD-10-CM | POA: Insufficient documentation

## 2019-09-11 DIAGNOSIS — D0511 Intraductal carcinoma in situ of right breast: Secondary | ICD-10-CM | POA: Insufficient documentation

## 2019-09-11 DIAGNOSIS — Z862 Personal history of diseases of the blood and blood-forming organs and certain disorders involving the immune mechanism: Secondary | ICD-10-CM | POA: Diagnosis not present

## 2019-09-11 MED ORDER — PANTOPRAZOLE SODIUM 20 MG PO TBEC
20.0000 mg | DELAYED_RELEASE_TABLET | Freq: Every day | ORAL | 0 refills | Status: DC
Start: 2019-09-11 — End: 2019-12-29

## 2019-09-11 MED ORDER — TAMOXIFEN CITRATE 20 MG PO TABS: 20.0000 mg | ORAL_TABLET | Freq: Every day | ORAL | 3 refills | Status: DC

## 2019-09-11 NOTE — Assessment & Plan Note (Signed)
Right breast DCIS ER/PR positive status post lumpectomy at [facility] currently on tamoxifen 10 mg daily March 2015  Tamoxifen Toxicities: Denies side effects of tamoxifen therapy. Patient wishes to stay on antiestrogen therapy with tamoxifen for 10 years even though data for DCIS is only up to 5. Patient understands risks and benefits.  Hypertension: On blood pressure medication. Follows with her primary care physician. 2019 total hysterectomy with bilateral salpingo-oophorectomy.  Surveillance:  Mammogram [facility]: Benign. She also follows up with her surgeon and with radiation oncologist. Breast exam 09/11/2019: Benign  History of microcytic anemia:She follows with her primary care physician. Obesity: Not lost weight. Walking and trying to lose weight.  Return to clinic in 1 year for follow-up

## 2019-09-11 NOTE — Telephone Encounter (Signed)
Scheduled appts per 6/25 los. Gave pt a print out of AVS.  

## 2019-09-13 ENCOUNTER — Encounter: Payer: Self-pay | Admitting: Registered"

## 2019-09-21 ENCOUNTER — Encounter (HOSPITAL_COMMUNITY): Payer: Self-pay

## 2019-09-21 ENCOUNTER — Other Ambulatory Visit: Payer: Self-pay

## 2019-09-21 ENCOUNTER — Ambulatory Visit (HOSPITAL_COMMUNITY)
Admission: EM | Admit: 2019-09-21 | Discharge: 2019-09-21 | Disposition: A | Discharge status: Home / Self Care | Payer: Medicaid Other | Attending: Family Medicine | Admitting: Family Medicine

## 2019-09-21 DIAGNOSIS — R05 Cough: Secondary | ICD-10-CM

## 2019-09-21 DIAGNOSIS — R0602 Shortness of breath: Secondary | ICD-10-CM | POA: Diagnosis not present

## 2019-09-21 DIAGNOSIS — R059 Cough, unspecified: Secondary | ICD-10-CM

## 2019-09-21 MED ORDER — PROMETHAZINE-DM 6.25-15 MG/5ML PO SYRP
5.0000 mL | ORAL_SOLUTION | Freq: Every evening | ORAL | 0 refills | Status: DC | PRN
Start: 2019-09-21 — End: 2019-12-01

## 2019-09-21 MED ORDER — DOXYCYCLINE HYCLATE 100 MG PO CAPS
100.0000 mg | ORAL_CAPSULE | Freq: Two times a day (BID) | ORAL | 0 refills | Status: AC
Start: 2019-09-21 — End: 2019-10-01

## 2019-09-21 MED ORDER — ALBUTEROL SULFATE HFA 108 (90 BASE) MCG/ACT IN AERS: 1.0000 | INHALATION_SPRAY | Freq: Four times a day (QID) | RESPIRATORY_TRACT | 0 refills | Status: AC | PRN

## 2019-09-21 NOTE — ED Triage Notes (Addendum)
Pt is here asthmatic for 2 weeks now, Pt has taken her inhaler to relieve discomfort. Pt does not want COVID testing today.

## 2019-09-21 NOTE — Discharge Instructions (Addendum)
Begin doxycycline twice daily x 1 week Cough syrup refilled May use with over the counter mucinex or zyrtec/claritin Continue albuterol inhaler as needed for shortness of breath, wheezing, chest tightness  If you develop increased cough, shortness of breath, chest discomfort, dizziness, lightheadedness, leg pain or swelling, worsening symptoms to follow-up in emergency room

## 2019-09-23 NOTE — ED Provider Notes (Signed)
[facility]    CSN: [id number] Arrival date & time: 09/21/19  1724      History   Chief Complaint Chief Complaint  Patient presents with  . Asthma  . Cough    HPI Sheryl Cox is a 49 y.o. female history of asthma, DM type II, hypertension, presenting today for evaluation of cough and shortness of breath.  Patient reports over the past few weeks she has had persistent cough and shortness of breath.  She was on a course of prednisone and was given albuterol inhaler which helped some, but symptoms are not fully relieved.  She was given Phenergan DM which was helping with cough especially at nighttime.  Symptoms have been persistent.  She denies fevers.  Has felt some leg swelling but has noticed this bilaterally.  Denies leg pain.  Denies prior DVT/PE.  Denies tobacco use, denies estrogen use.  Denies recent travel/immobilization or surgery.  Denies URI symptoms of congestion, sore throat.  Cough is productive at times.  HPI  Past Medical History:  Diagnosis Date  . Anemia    history of blood transfusion-no abnormal reaction noted  . Asthma    Albuterol as needed  . Breast cancer ([place]) 11/2012   DCIS-ER+, PR+.Takes Tamoxifen daily  . Diabetes mellitus without complication ([place])    takes Metformin daily  . History of migraine    several yrs ago  . Hypertension    takes Tribenzor daily    Patient Active Problem List   Diagnosis Date Noted  . Chronic pain of both knees 05/21/2018  . Chronic right shoulder pain 05/21/2018  . BMI 40.0-44.9, adult ([place]) 05/21/2018  . Anemia 06/05/2013  . 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 Nabothian cyst 12/07/2011  . Cervical mass 11/20/2011  . Fibroid uterus 11/20/2011    Past Surgical History:  Procedure Laterality Date  . BIOPSY BREAST Left 12/05/12   fibroadenoma  . BREAST LUMPECTOMY     2014  . BREAST SURGERY Right   . CESAREAN SECTION  01/05/2009  . CHOLECYSTECTOMY N/A 01/31/2016    Procedure: LAPAROSCOPIC CHOLECYSTECTOMY;  Surgeon: Erroll Luna, MD;  Location: [facility];  Service: General;  Laterality: N/A;  . DILITATION & CURRETTAGE/HYSTROSCOPY WITH HYDROTHERMAL ABLATION N/A 08/12/2013   Procedure: DILATATION & CURETTAGE/HYSTEROSCOPY WITH HYDROTHERMAL ABLATION;  Surgeon: Frederico Hamman, MD;  Location: [facility];  Service: Gynecology;  Laterality: N/A;  . ROBOTIC ASSISTED TOTAL HYSTERECTOMY N/A 06/18/2017   Procedure: XI ROBOTIC ASSISTED TOTAL HYSTERECTOMY;  Surgeon: Lavonia Drafts, MD;  Location: [facility];  Service: Gynecology;  Laterality: N/A;  . SALPINGOOPHORECTOMY Bilateral 06/18/2017   Procedure: BILATERAL SALPINGO OOPHORECTOMY;  Surgeon: Lavonia Drafts, MD;  Location: [facility];  Service: Gynecology;  Laterality: Bilateral;    OB History    Gravida  1   Para  1   Term  1   Preterm  0   AB  0   Living  1     SAB  0   TAB  0   Ectopic  0   Multiple  0   Live Births  1            Home Medications    Prior to Admission medications   Medication Sig Start Date End Date Taking? Authorizing Provider  acetaminophen (TYLENOL) 325 MG tablet Take 650 mg by mouth daily as needed for headache.    [provider]  albuterol (VENTOLIN HFA) 108 (90 Base) MCG/ACT inhaler Inhale 1-2  puffs into the lungs every 6 (six) hours as needed for wheezing or shortness of breath. 09/21/19   Ardelle Haliburton C, PA-C  ALPRAZolam (XANAX) 0.25 MG tablet TAKE 1 TABLET BY MOUTH TWICE A DAY AS NEEDED FOR ANXIETY 08/25/18   Lucianne Lei, MD  amLODipine (NORVASC) 10 MG tablet Take 10 mg by mouth daily. 02/23/17   [provider]  Ascorbic Acid (VITAMIN C PO) Take by mouth.    [provider]  doxycycline (VIBRAMYCIN) 100 MG capsule Take 1 capsule (100 mg total) by mouth 2 (two) times daily for 10 days. 09/21/19 10/01/19  Nicky Milhouse C, PA-C  ELDERBERRY PO Take by mouth.    [provider]  fluticasone (FLONASE) 50 MCG/ACT nasal spray  Place 2 sprays into both nostrils daily. 05/12/18   Tasia Catchings, Amy V, PA-C  hydrochlorothiazide (HYDRODIURIL) 12.5 MG tablet Take 2 tablets (25 mg total) by mouth daily. Patient taking differently: Take 12.5 mg by mouth daily.  05/14/18   Scot Jun, FNP  meloxicam (MOBIC) 15 MG tablet Take 1 tablet (15 mg total) by mouth daily. 05/14/18   Scot Jun, FNP  metFORMIN (GLUCOPHAGE) 500 MG tablet Take 1 tablet (500 mg total) by mouth 2 (two) times daily with a meal. 06/19/17   Lavonia Drafts, MD  Multiple Vitamins-Minerals (MULTIVITAMIN PO) Take 1 tablet by mouth daily.    [provider]  pantoprazole (PROTONIX) 20 MG tablet Take 1 tablet (20 mg total) by mouth daily. 09/11/19   Nicholas Lose, MD  promethazine-dextromethorphan (PROMETHAZINE-DM) 6.25-15 MG/5ML syrup Take 5 mLs by mouth at bedtime as needed for cough. 09/21/19   Ayriel Texidor C, PA-C  tamoxifen (NOLVADEX) 20 MG tablet Take 1 tablet (20 mg total) by mouth daily. 09/11/19   Nicholas Lose, MD    Family History Family History  Problem Relation Age of Onset  . Hypertension Mother   . Diabetes Mother   . COPD Mother   . Breast cancer Paternal Grandmother        dx in her 74s  . Prostate cancer Paternal Grandfather   . Prostate cancer Father 81  . Prostate cancer Paternal Uncle   . Breast cancer Other        paternal grandmother's sister  . Breast cancer Paternal Aunt   . Anesthesia problems Neg Hx   . Amblyopia Neg Hx   . Blindness Neg Hx   . Cataracts Neg Hx   . Glaucoma Neg Hx   . Macular degeneration Neg Hx   . Retinal detachment Neg Hx   . Strabismus Neg Hx   . Retinitis pigmentosa Neg Hx     Social History Social History   Tobacco Use  . Smoking status: Never Smoker  . Smokeless tobacco: Never Used  Vaping Use  . Vaping Use: Never used  Substance Use Topics  . Alcohol use: Yes    Alcohol/week: 1.0 standard drink    Types: 1 Cans of beer per week    Comment: occa  . Drug use: No      Allergies   Shrimp [shellfish allergy] and Aleve [naproxen]   Review of Systems Review of Systems  Constitutional: Negative for activity change, appetite change, chills, fatigue and fever.  HENT: Positive for congestion. Negative for ear pain, rhinorrhea, sinus pressure, sore throat and trouble swallowing.   Eyes: Negative for discharge and redness.  Respiratory: Positive for cough and shortness of breath. Negative for chest tightness.   Cardiovascular: Negative for chest pain.  Gastrointestinal:  Negative for abdominal pain, diarrhea, nausea and vomiting.  Musculoskeletal: Negative for myalgias.  Skin: Negative for rash.  Neurological: Negative for dizziness, light-headedness and headaches.     Physical Exam Triage Vital Signs ED Triage Vitals  Enc Vitals Group     BP 09/21/19 1820 (!) 145/88     Pulse Rate 09/21/19 1820 (!) 107     Resp 09/21/19 1820 (!) 21     Temp 09/21/19 1820 99.6 F (37.6 C)     Temp src --      SpO2 09/21/19 1820 98 %     Weight 09/21/19 1820 245 lb (111.1 kg)     Height --      Head Circumference --      Peak Flow --      Pain Score 09/21/19 1816 0     Pain Loc --      Pain Edu? --      Excl. in [place]? --    No data found.  Updated Vital Signs BP (!) 145/88 (BP Location: Left Arm)   Pulse (!) 107   Temp 99.6 F (37.6 C)   Resp (!) 21   Wt 245 lb (111.1 kg)   LMP 05/27/2017 (Approximate)   SpO2 98%   BMI 43.40 kg/m   Visual Acuity Right Eye Distance:   Left Eye Distance:   Bilateral Distance:    Right Eye Near:   Left Eye Near:    Bilateral Near:     Physical Exam Vitals and nursing note reviewed.  Constitutional:      Appearance: She is well-developed.     Comments: No acute distress  HENT:     Head: Normocephalic and atraumatic.     Ears:     Comments: Bilateral ears without tenderness to palpation of external auricle, tragus and mastoid, EAC's without erythema or swelling, TM's with good bony landmarks and cone of  light. Non erythematous.     Nose: Nose normal.     Mouth/Throat:     Comments: Oral mucosa pink and moist, no tonsillar enlargement or exudate. Posterior pharynx patent and nonerythematous, no uvula deviation or swelling. Normal phonation.  Eyes:     Conjunctiva/sclera: Conjunctivae normal.  Cardiovascular:     Rate and Rhythm: Tachycardia present.  Pulmonary:     Effort: Pulmonary effort is normal. No respiratory distress.     Comments: Breathing comfortably at rest, CTABL, no wheezing, rales or other adventitious sounds auscultated  Abdominal:     General: There is no distension.  Musculoskeletal:        General: Normal range of motion.     Cervical back: Neck supple.     Comments: Bilateral lower legs without notable swelling, no erythema, no calf tenderness, negative Homans  Skin:    General: Skin is warm and dry.  Neurological:     Mental Status: She is alert and oriented to person, place, and time.      UC Treatments / Results  Labs (all labs ordered are listed, but only abnormal results are displayed) Labs Reviewed - No data to display  EKG   Radiology No results found.  Procedures Procedures (including critical care time)  Medications Ordered in UC Medications - No data to display  Initial Impression / Assessment and Plan / UC Course  I have reviewed the triage vital signs and the nursing notes.  Pertinent labs & imaging results that were available during my care of the patient were reviewed by me and considered  in my medical decision making (see chart for details).     Adding in doxycycline to cover atypicals, lungs overall clear, deferring further steroid therapy, refilling Phenergan DM.  Suspect still most likely respiratory infection, lower suspicion of PE, but did discuss this with patient given her tachycardia and advised if her symptoms persist or worsen she should follow-up in emergency room for further evaluation of her cough and  symptoms.  Discussed strict return precautions. Patient verbalized understanding and is agreeable with plan.  Final Clinical Impressions(s) / UC Diagnoses   Final diagnoses:  Cough  Shortness of breath     Discharge Instructions     Begin doxycycline twice daily x 1 week Cough syrup refilled May use with over the counter mucinex or zyrtec/claritin Continue albuterol inhaler as needed for shortness of breath, wheezing, chest tightness  If you develop increased cough, shortness of breath, chest discomfort, dizziness, lightheadedness, leg pain or swelling, worsening symptoms to follow-up in emergency room   ED Prescriptions    Medication Sig Dispense Auth. Provider   doxycycline (VIBRAMYCIN) 100 MG capsule Take 1 capsule (100 mg total) by mouth 2 (two) times daily for 10 days. 20 capsule Lavelle Akel C, PA-C   promethazine-dextromethorphan (PROMETHAZINE-DM) 6.25-15 MG/5ML syrup Take 5 mLs by mouth at bedtime as needed for cough. 100 mL Oron Westrup C, PA-C   albuterol (VENTOLIN HFA) 108 (90 Base) MCG/ACT inhaler Inhale 1-2 puffs into the lungs every 6 (six) hours as needed for wheezing or shortness of breath. 18 g Casson Catena, Fonda C, PA-C     PDMP not reviewed this encounter.   Janith Lima, [place] 09/23/19 1808

## 2019-09-29 ENCOUNTER — Telehealth: Payer: Self-pay | Admitting: *Deleted

## 2019-09-29 NOTE — Telephone Encounter (Signed)
Patient called responding to the [facility] call on today ,states she does not need a follow up call and has no questions .

## 2019-10-20 ENCOUNTER — Encounter (HOSPITAL_COMMUNITY): Payer: Self-pay | Admitting: Emergency Medicine

## 2019-10-20 ENCOUNTER — Emergency Department (HOSPITAL_COMMUNITY)
Admission: EM | Admit: 2019-10-20 | Discharge: 2019-10-20 | Disposition: A | Discharge status: Home / Self Care | Payer: Medicaid Other | Attending: Emergency Medicine | Admitting: Emergency Medicine

## 2019-10-20 ENCOUNTER — Other Ambulatory Visit: Payer: Self-pay

## 2019-10-20 DIAGNOSIS — J45909 Unspecified asthma, uncomplicated: Secondary | ICD-10-CM | POA: Diagnosis not present

## 2019-10-20 DIAGNOSIS — Z7984 Long term (current) use of oral hypoglycemic drugs: Secondary | ICD-10-CM | POA: Diagnosis not present

## 2019-10-20 DIAGNOSIS — I10 Essential (primary) hypertension: Secondary | ICD-10-CM | POA: Insufficient documentation

## 2019-10-20 DIAGNOSIS — M545 Low back pain: Secondary | ICD-10-CM | POA: Diagnosis not present

## 2019-10-20 DIAGNOSIS — E119 Type 2 diabetes mellitus without complications: Secondary | ICD-10-CM | POA: Diagnosis not present

## 2019-10-20 DIAGNOSIS — Z9012 Acquired absence of left breast and nipple: Secondary | ICD-10-CM | POA: Diagnosis not present

## 2019-10-20 DIAGNOSIS — Z79899 Other long term (current) drug therapy: Secondary | ICD-10-CM | POA: Diagnosis not present

## 2019-10-20 DIAGNOSIS — R252 Cramp and spasm: Secondary | ICD-10-CM | POA: Insufficient documentation

## 2019-10-20 DIAGNOSIS — S39012A Strain of muscle, fascia and tendon of lower back, initial encounter: Secondary | ICD-10-CM

## 2019-10-20 LAB — URINALYSIS, ROUTINE W REFLEX MICROSCOPIC
Bilirubin Urine: NEGATIVE
Glucose, UA: NEGATIVE mg/dL
Hgb urine dipstick: NEGATIVE
Ketones, ur: NEGATIVE mg/dL
Leukocytes,Ua: NEGATIVE
Nitrite: NEGATIVE
Protein, ur: NEGATIVE mg/dL
Specific Gravity, Urine: 1.004 — ABNORMAL LOW (ref 1.005–1.030)
pH: 6 (ref 5.0–8.0)

## 2019-10-20 MED ORDER — CELECOXIB 200 MG PO CAPS: 200.0000 mg | ORAL_CAPSULE | Freq: Two times a day (BID) | ORAL | 0 refills | Status: AC

## 2019-10-20 MED ORDER — CYCLOBENZAPRINE HCL 10 MG PO TABS: 5.0000 mg | ORAL_TABLET | Freq: Two times a day (BID) | ORAL | 0 refills | Status: DC | PRN

## 2019-10-20 NOTE — Discharge Instructions (Signed)
Please follow up with Dr. [name] to ask for a referral to PT for treatment of Low back pain.  SEEK IMMEDIATE MEDICAL ATTENTION IF: New numbness, tingling, weakness, or problem with the use of your arms or legs.  Severe back pain not relieved with medications.  Change in bowel or bladder control.  Increasing pain in any areas of the body (such as chest or abdominal pain).  Shortness of breath, dizziness or fainting.  Nausea (feeling sick to your stomach), vomiting, fever, or sweats.

## 2019-10-20 NOTE — ED Triage Notes (Signed)
Pt reports low back since Sunday, denies any injury. Pt has no radiation into abd or legs. No numbness or tingling. No urinary sx.

## 2019-10-20 NOTE — ED Provider Notes (Signed)
[facility] EMERGENCY DEPARTMENT Provider Note   CSN: [id number] Arrival date & time: 10/20/19  1194     History Chief Complaint  Patient presents with  . Back Pain    Sheryl Cox is a 49 y.o. female with a past medical history of obesity, breast cancer, diabetes, hypertension who presents emergency department with chief complaint of low back pain. She has had 3 days of low back pain.  She woke up with the pain.  She does not remember injuring her back or lifting anything heavy.  Pain is made worse with twisting, leaning forward, changing position.  She denies fevers, chills, saddle anesthesia, leg weakness, urinary symptoms, or intractable pain.  The pain does improve with ibuprofen but does not go away.   HPI     Past Medical History:  Diagnosis Date  . Anemia    history of blood transfusion-no abnormal reaction noted  . Asthma    Albuterol as needed  . 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 Diabetes mellitus without complication ([place])    takes Metformin daily  . History of migraine    several yrs ago  . Hypertension    takes Tribenzor daily    Patient Active Problem List   Diagnosis Date Noted  . Chronic pain of both knees 05/21/2018  . Chronic right shoulder pain 05/21/2018  . BMI 40.0-44.9, adult ([place]) 05/21/2018  . Anemia 06/05/2013  . 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 Nabothian cyst 12/07/2011  . Cervical mass 11/20/2011  . Fibroid uterus 11/20/2011    Past Surgical History:  Procedure Laterality Date  . BIOPSY BREAST Left 12/05/12   fibroadenoma  . BREAST LUMPECTOMY     2014  . BREAST SURGERY Right   . CESAREAN SECTION  01/05/2009  . CHOLECYSTECTOMY N/A 01/31/2016   Procedure: LAPAROSCOPIC CHOLECYSTECTOMY;  Surgeon: Erroll Luna, MD;  Location: [facility];  Service: General;  Laterality: N/A;  . DILITATION & CURRETTAGE/HYSTROSCOPY WITH HYDROTHERMAL ABLATION N/A 08/12/2013    Procedure: DILATATION & CURETTAGE/HYSTEROSCOPY WITH HYDROTHERMAL ABLATION;  Surgeon: Frederico Hamman, MD;  Location: [facility];  Service: Gynecology;  Laterality: N/A;  . ROBOTIC ASSISTED TOTAL HYSTERECTOMY N/A 06/18/2017   Procedure: XI ROBOTIC ASSISTED TOTAL HYSTERECTOMY;  Surgeon: Lavonia Drafts, MD;  Location: [facility];  Service: Gynecology;  Laterality: N/A;  . SALPINGOOPHORECTOMY Bilateral 06/18/2017   Procedure: BILATERAL SALPINGO OOPHORECTOMY;  Surgeon: Lavonia Drafts, MD;  Location: [facility];  Service: Gynecology;  Laterality: Bilateral;     OB History    Gravida  1   Para  1   Term  1   Preterm  0   AB  0   Living  1     SAB  0   TAB  0   Ectopic  0   Multiple  0   Live Births  1           Family History  Problem Relation Age of Onset  . Hypertension Mother   . Diabetes Mother   . COPD Mother   . Breast cancer Paternal Grandmother        dx in her 14s  . Prostate cancer Paternal Grandfather   . Prostate cancer Father 36  . Prostate cancer Paternal Uncle   . Breast cancer Other        paternal grandmother's sister  . Breast cancer Paternal Aunt   . Anesthesia problems Neg Hx   .  Amblyopia Neg Hx   . Blindness Neg Hx   . Cataracts Neg Hx   . Glaucoma Neg Hx   . Macular degeneration Neg Hx   . Retinal detachment Neg Hx   . Strabismus Neg Hx   . Retinitis pigmentosa Neg Hx     Social History   Tobacco Use  . Smoking status: Never Smoker  . Smokeless tobacco: Never Used  Vaping Use  . Vaping Use: Never used  Substance Use Topics  . Alcohol use: Yes    Alcohol/week: 1.0 standard drink    Types: 1 Cans of beer per week    Comment: occa  . Drug use: No    Home Medications Prior to Admission medications   Medication Sig Start Date End Date Taking? Authorizing Provider  acetaminophen (TYLENOL) 325 MG tablet Take 650 mg by mouth daily as needed for headache.    [provider]  albuterol (VENTOLIN HFA) 108 (90 Base)  MCG/ACT inhaler Inhale 1-2 puffs into the lungs every 6 (six) hours as needed for wheezing or shortness of breath. 09/21/19   Wieters, Hallie C, PA-C  ALPRAZolam (XANAX) 0.25 MG tablet TAKE 1 TABLET BY MOUTH TWICE A DAY AS NEEDED FOR ANXIETY 08/25/18   Lucianne Lei, MD  amLODipine (NORVASC) 10 MG tablet Take 10 mg by mouth daily. 02/23/17   [provider]  Ascorbic Acid (VITAMIN C PO) Take by mouth.    [provider]  ELDERBERRY PO Take by mouth.    [provider]  fluticasone (FLONASE) 50 MCG/ACT nasal spray Place 2 sprays into both nostrils daily. 05/12/18   Tasia Catchings, Amy V, PA-C  hydrochlorothiazide (HYDRODIURIL) 12.5 MG tablet Take 2 tablets (25 mg total) by mouth daily. Patient taking differently: Take 12.5 mg by mouth daily.  05/14/18   Scot Jun, FNP  meloxicam (MOBIC) 15 MG tablet Take 1 tablet (15 mg total) by mouth daily. 05/14/18   Scot Jun, FNP  metFORMIN (GLUCOPHAGE) 500 MG tablet Take 1 tablet (500 mg total) by mouth 2 (two) times daily with a meal. 06/19/17   Lavonia Drafts, MD  Multiple Vitamins-Minerals (MULTIVITAMIN PO) Take 1 tablet by mouth daily.    [provider]  pantoprazole (PROTONIX) 20 MG tablet Take 1 tablet (20 mg total) by mouth daily. 09/11/19   Nicholas Lose, MD  promethazine-dextromethorphan (PROMETHAZINE-DM) 6.25-15 MG/5ML syrup Take 5 mLs by mouth at bedtime as needed for cough. 09/21/19   Wieters, Hallie C, PA-C  tamoxifen (NOLVADEX) 20 MG tablet Take 1 tablet (20 mg total) by mouth daily. 09/11/19   Nicholas Lose, MD    Allergies    Shrimp [shellfish allergy] and Aleve [naproxen]  Review of Systems   Review of Systems Ten systems reviewed and are negative for acute change, except as noted in the HPI.   Physical Exam Updated Vital Signs BP (!) 160/97 (BP Location: Right Arm)   Pulse 88   Temp 98.8 F (37.1 C) (Oral)   Resp 20   Ht 5\' 3"  (1.6 m)   Wt 113.4 kg   LMP 05/27/2017 (Approximate)   SpO2 100%    BMI 44.29 kg/m   Physical Exam Vitals and nursing note reviewed.  Constitutional:      General: She is not in acute distress.    Appearance: She is well-developed. She is not diaphoretic.  HENT:     Head: Normocephalic and atraumatic.  Eyes:     General: No scleral icterus.    Conjunctiva/sclera: Conjunctivae normal.  Cardiovascular:     Rate and Rhythm: Normal rate and regular rhythm.     Heart sounds: Normal heart sounds. No murmur heard.  No friction rub. No gallop.   Pulmonary:     Effort: Pulmonary effort is normal. No respiratory distress.     Breath sounds: Normal breath sounds.  Abdominal:     General: Bowel sounds are normal. There is no distension.     Palpations: Abdomen is soft. There is no mass.     Tenderness: There is no abdominal tenderness. There is no guarding.  Musculoskeletal:     Cervical back: Normal and normal range of motion.     Thoracic back: Normal.     Lumbar back: Spasms and tenderness present. No bony tenderness. Decreased range of motion. Negative right straight leg raise test and negative left straight leg raise test.       Back:  Skin:    General: Skin is warm and dry.  Neurological:     Mental Status: She is alert and oriented to person, place, and time.  Psychiatric:        Behavior: Behavior normal.     ED Results / Procedures / Treatments   Labs (all labs ordered are listed, but only abnormal results are displayed) Labs Reviewed  URINALYSIS, ROUTINE W REFLEX MICROSCOPIC - Abnormal; Notable for the following components:      Result Value   Color, Urine STRAW (*)    Specific Gravity, Urine 1.004 (*)    All other components within normal limits    EKG None  Radiology No results found.  Procedures Procedures (including critical care time)  Medications Ordered in ED Medications - No data to display  ED Course  I have reviewed the triage vital signs and the nursing notes.  Pertinent labs & imaging results that were  available during my care of the patient were reviewed by me and considered in my medical decision making (see chart for details).    MDM Rules/Calculators/A&P                          Patient with back pain.  No neurological deficits and normal neuro exam.  Patient can walk but states is painful.  No loss of bowel or bladder control.  No concern for cauda equina.  No fever, night sweats, weight loss, h/o cancer, IVDU.  RICE protocol and pain medicine indicated and discussed with patient.   Final Clinical Impression(s) / ED Diagnoses Final diagnoses:  None    Rx / DC Orders ED Discharge Orders    None       Margarita Mail, PA-C 10/20/19 1259    Hayden Rasmussen, MD 10/20/19 [phone]

## 2019-10-28 ENCOUNTER — Ambulatory Visit: Payer: Medicaid Other | Attending: Nurse Practitioner

## 2019-10-28 ENCOUNTER — Other Ambulatory Visit: Payer: Self-pay

## 2019-10-28 DIAGNOSIS — M545 Low back pain, unspecified: Secondary | ICD-10-CM

## 2019-10-28 DIAGNOSIS — M6281 Muscle weakness (generalized): Secondary | ICD-10-CM | POA: Diagnosis present

## 2019-10-28 DIAGNOSIS — M62830 Muscle spasm of back: Secondary | ICD-10-CM | POA: Insufficient documentation

## 2019-10-28 DIAGNOSIS — R262 Difficulty in walking, not elsewhere classified: Secondary | ICD-10-CM | POA: Insufficient documentation

## 2019-10-30 NOTE — Therapy (Signed)
[place], [place], [address] Phone: [phone]   Fax:  [phone]  Physical Therapy Evaluation  Patient Details  Name: BRYANAH SIDELL MRN: [id number] Date of Birth: [birth year] Referring Provider (PT): Jordan Hawks, NP   Encounter Date: 10/28/2019   PT End of Session - 10/30/19 0714    Visit Number 1    Number of Visits 4    Date for PT Re-Evaluation 12/18/19    Authorization Type [place] MEDICAID [employer]    Authorization Time Period 10/28/19-11/13/19    Authorization - Visit Number 0    Authorization - Number of Visits 3    Progress Note Due on Visit 10    PT Start Time 0832    PT Stop Time 0919    PT Time Calculation (min) 47 min    Activity Tolerance Patient tolerated treatment well    Behavior During Therapy [facility] for tasks assessed/performed           Past Medical History:  Diagnosis Date  . Anemia    history of blood transfusion-no abnormal reaction noted  . Asthma    Albuterol as needed  . 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 Diabetes mellitus without complication ([place])    takes Metformin daily  . History of migraine    several yrs ago  . Hypertension    takes Tribenzor daily    Past Surgical History:  Procedure Laterality Date  . BIOPSY BREAST Left 12/05/12   fibroadenoma  . BREAST LUMPECTOMY     2014  . BREAST SURGERY Right   . CESAREAN SECTION  01/05/2009  . CHOLECYSTECTOMY N/A 01/31/2016   Procedure: LAPAROSCOPIC CHOLECYSTECTOMY;  Surgeon: Erroll Luna, MD;  Location: [facility];  Service: General;  Laterality: N/A;  . DILITATION & CURRETTAGE/HYSTROSCOPY WITH HYDROTHERMAL ABLATION N/A 08/12/2013   Procedure: DILATATION & CURETTAGE/HYSTEROSCOPY WITH HYDROTHERMAL ABLATION;  Surgeon: Frederico Hamman, MD;  Location: [facility];  Service: Gynecology;  Laterality: N/A;  . ROBOTIC ASSISTED TOTAL HYSTERECTOMY N/A 06/18/2017   Procedure: XI  ROBOTIC ASSISTED TOTAL HYSTERECTOMY;  Surgeon: Lavonia Drafts, MD;  Location: [facility];  Service: Gynecology;  Laterality: N/A;  . SALPINGOOPHORECTOMY Bilateral 06/18/2017   Procedure: BILATERAL SALPINGO OOPHORECTOMY;  Surgeon: Lavonia Drafts, MD;  Location: [facility];  Service: Gynecology;  Laterality: Bilateral;    There were no vitals filed for this visit.        [facility] PT Assessment - 10/30/19 0001      Assessment   Medical Diagnosis Low back pain    Referring Provider (PT) Jordan Hawks, NP    Onset Date/Surgical Date 10/11/19    Hand Dominance Right    Next MD Visit 11/25/19    Prior Therapy no      Precautions   Precautions None      Restrictions   Weight Bearing Restrictions No      Balance Screen   Has the patient fallen in the past 6 months No      [place] residence    Living Arrangements Children    Type of [place] to enter    Entrance Stairs-Number of Steps 3    [place] One level      Prior Function   Level of Independence Independent    Vocation Unemployed    Leisure Takes care of 87 yo daughter  Cognition   Overall Cognitive Status Within Functional Limits for tasks assessed      Observation/Other Assessments   Focus on Therapeutic Outcomes (FOTO)  NA      Sensation   Light Touch Appears Intact      Posture/Postural Control   Posture/Postural Control Postural limitations    Postural Limitations Forward head;Increased lumbar lordosis      ROM / Strength   AROM / PROM / Strength AROM;Strength      AROM   AROM Assessment Site Lumbar    Lumbar Flexion Reach to knees   Pulling pain, lordosis not reversed   Lumbar Extension WNLs   Sharp pain   Lumbar - Right Side Bend Reach 2" above knee   Sharp pain   Lumbar - Left Side Bend Reach 1" above knee    Lumbar - Right Rotation WNLs    Lumbar - Left Rotation WNLs      Strength   Overall  Strength Comments LS myotomal screen negative      Flexibility   Soft Tissue Assessment /Muscle Length yes    Hamstrings R=35d; L=33d      Palpation   Palpation comment TTP of the lumbar paraspinals with increased muscle tension noted      Special Tests    Special Tests Lumbar;Hip Special Tests    Lumbar Tests Slump Test;FABER test;Straight Leg Raise    Hip Special Tests  Hip Scouring      FABER test   findings Negative    Side Right   LT     Slump test   Findings Negative      Straight Leg Raise   Findings Negative    Side  Right   Lt     Hip Scouring   Findings Negative    Side Right   Lt     Transfers   Transfers Sit to Stand;Stand to Sit    Sit to Stand 7: Independent      Ambulation/Gait   Ambulation/Gait Yes    Ambulation/Gait Assistance 7: Independent    Gait Pattern Within Functional Limits                      Objective measurements completed on examination: See above findings.               PT Education - 10/30/19 0712    Education Details Eval findings; POC; HEP: measures to reduce and manage pain:heat/cold and positioning/support for sleepin    Person(s) Educated Patient    Methods Explanation;Demonstration;Tactile cues;Verbal cues;Handout;Other (comment)    Comprehension Verbalized understanding;Returned demonstration;Verbal cues required;Tactile cues required;Need further instruction            PT Short Term Goals - 10/30/19 0734      PT SHORT TERM GOAL #1   Title Ptwill be Ind in an inital HEP    Baseline strated on IE    Time 3    Period Weeks    Status New    Target Date 11/20/19      PT SHORT TERM GOAL #2   Title Pt will voice understanding of measures to reuce and manage pain    Time 3    Period Weeks    Status New    Target Date 11/20/19             PT Long Term Goals - 10/30/19 0736      PT LONG TERM GOAL #1   Title Pt will be  Ind in a final HEP    Time 7    Period Weeks    Status New     Target Date 12/18/19      PT LONG TERM GOAL #2   Title Improve bilat hamstring ROM to 50d    Baseline L 33d; R 35d    Time 7    Period Weeks    Status New    Target Date 12/18/19      PT LONG TERM GOAL #3   Title Improve trunk forward and side bending to 4 inches past knees, and to knee respectively    Baseline Se flowsheet    Time 7    Period Weeks    Status New    Target Date 12/18/19      PT LONG TERM GOAL #4   Title Pt will report improved LBP not exceeding 3/10 with daily activities    Baseline 6/10    Time 7    Period Weeks    Status New    Target Date 12/18/19      PT LONG TERM GOAL #5   Title Pt will demonstrate proper body mechanics for daily activities and lifting for improved back care    Time 7    Period Weeks    Status New    Target Date 12/18/19                  Plan - 10/30/19 0720    Clinical Impression Statement Pt presents to PT with acute LBP for approx. 2 weeks. Pt woke up in pain one morning and it has not resolved. Pt has increased lumbar lordosis which does not reverse with forward bending, increased lumbar lordosis, obesity, increased muscle tension of the lumbar paraspinals, tight hamstrings, and increased pain c lumbar ext. Pt will benefit 2w6 from PT to address flexibility of the low back and hamstrings and core strengthening to help decrease the compressive forces on her low back.    Personal Factors and Comorbidities Comorbidity 2    Comorbidities obesity, DM    Examination-Activity Limitations Locomotion Level;Bend;Reach Overhead;Caring for Others;Carry;Lift;Stand;Sit;Squat;Stairs    Stability/Clinical Decision Making Stable/Uncomplicated    Clinical Decision Making Low    Rehab Potential Good    PT Frequency 2x / week    PT Duration 6 weeks    PT Treatment/Interventions ADLs/Self Care Home Management;Cryotherapy;Electrical Stimulation;Ultrasound;Traction;Moist Heat;Iontophoresis 4mg /ml Dexamethasone;Functional mobility  training;Therapeutic activities;Therapeutic exercise;Manual techniques;Patient/family education;Passive range of motion;Dry needling;Taping    PT Next Visit Plan Assess response to HEP and hip flexor tightness    PT Home Exercise Plan [id number]: Childs pose seated; hamstring stretch seated    Consulted and Agree with Plan of Care Patient           Patient will benefit from skilled therapeutic intervention in order to improve the following deficits and impairments:  Difficulty walking, Decreased range of motion, Increased muscle spasms, Obesity, Decreased activity tolerance, Pain, Impaired flexibility, Decreased strength, Postural dysfunction  Visit Diagnosis: Acute midline low back pain without sciatica - Plan: PT plan of care cert/re-cert  Muscle weakness (generalized) - Plan: PT plan of care cert/re-cert  Difficulty in walking, not elsewhere classified - Plan: PT plan of care cert/re-cert  Muscle spasm of back - Plan: PT plan of care cert/re-cert     Problem List Patient Active Problem List   Diagnosis Date Noted  . Chronic pain of both knees 05/21/2018  . Chronic right shoulder pain 05/21/2018  . BMI 40.0-44.9, adult ([place]) 05/21/2018  .  Anemia 06/05/2013  . 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 Nabothian cyst 12/07/2011  . Cervical mass 11/20/2011  . Fibroid uterus 11/20/2011    Gar Ponto MS, PT 10/30/19 7:51 AM  [facility] [address] [place], [place], [address] Phone: [phone]   Fax:  [phone]  Name: MAIKO SALAIS MRN: [id number] Date of Birth: 28-Nov-1970   Check all possible CPT codes:      [x]  97110 (Therapeutic Exercise)  []  92507 (SLP Treatment)  [x]  97112 (Neuro Re-ed)   []  92526 (Swallowing Treatment)   []  97116 (Gait Training)   []  [phone] (Cognitive Training, 1st 15 minutes) [x]  97140 (Manual Therapy)   []  97130 (Cognitive Training, each add'l 15 minutes)  [x]  97530  (Therapeutic Activities)  []  Other, List CPT Code ____________    [x]  67124 (Self Care)       []  All codes above (97110 - 97535)  [x]  97012 (Mechanical Traction)  [x]  97014 (E-stim Unattended)  [x]  97032 (E-stim manual)  [x]  97033 (Ionto)  [x]  97035 (Ultrasound)  []  97016 (Vaso)  []  97760 (Orthotic Fit) []  [id number] (Prosthetic Training) []  [id number] (Physical Performance Training) []  [id number] (Aquatic Therapy) []  [id number] (Canalith Repositioning) []  [id number] (Contrast Bath) []  [id number] (Paraffin) []  97597 (Wound Care 1st 20 sq cm) []  97598 (Wound Care each add'l 20 sq cm)

## 2019-11-02 ENCOUNTER — Other Ambulatory Visit: Payer: Self-pay

## 2019-11-02 ENCOUNTER — Encounter: Payer: Self-pay | Admitting: Registered"

## 2019-11-02 ENCOUNTER — Encounter: Payer: Medicaid Other | Attending: Family Medicine | Admitting: Registered"

## 2019-11-02 DIAGNOSIS — I10 Essential (primary) hypertension: Secondary | ICD-10-CM | POA: Diagnosis present

## 2019-11-02 DIAGNOSIS — R7303 Prediabetes: Secondary | ICD-10-CM | POA: Insufficient documentation

## 2019-11-02 DIAGNOSIS — R739 Hyperglycemia, unspecified: Secondary | ICD-10-CM

## 2019-11-02 DIAGNOSIS — E669 Obesity, unspecified: Secondary | ICD-10-CM | POA: Diagnosis present

## 2019-11-02 NOTE — Patient Instructions (Addendum)
Instructions/Goals: Try to include 3 meals per day. Plan out what will be quick and balanced for breakfast ahead of time.   Quick Breakfast Ideas:  Greek yogurt may add fruit   Whole grain cracker with Sunbutter OR low fat cheese may add fruit   Banana or apple with Sunbutter OR boiled eggs  Whole grain crackers or bread, boiled eggs, fruit  -Recommend trying 1% or skim milk in place of orange juice.   -Recommend trying 93% lean hamburger meat in place of 85%   Continue working to include vegetables at lunch and dinner.   For heart health, look for foods low in saturated fat and sodium:   5% or less = low and 20% or more = high  Recommend avoiding grapefruit due to risk of medication interaction.   Recommend including at least 4 bottles of water (64 oz) daily.   Make physical activity a part of your week. Try to include at least 30 minutes of physical activity 5 days each week or at least 150 minutes per week. Regular physical activity promotes overall health-including helping to reduce risk for heart disease and diabetes, promoting mental health, and helping [place] sleep better.    Starting Goal: Continue working to walk more often. Try to include 10 minutes of walking most days per week in addition to your 30 minute walks on Saturday.   -Continue with vitamin D per prescription and iron supplement. Include a good high in vitamin C when taking iron supplement (see list).   -Recommend having thyroid checked as well with labs.

## 2019-11-02 NOTE — Progress Notes (Signed)
Medical Nutrition Therapy:  Appt start time: 0952 end time:  1025  Assessment:  Primary concerns today: Pt referred due to hyperglycemia, weight management, and HTN. Pt has been dx with prediabetes. Nutrition Follow-Up: Pt present for appointment alone.   Pt reports feeling she has been gaining and losing wt. Reports as far as eating goes, she feels she eats what she is supposed to but eats too late in the evening. Also reports when eating on the go she will grab fast food such as burgers and fries. Reports she is doing well with including more fruits and vegetables. 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 Pt reports she is now taking 2 additional medications: rybelsus and a muscle relaxer.   Pt reports needing to move away from fried foods. Reports eating fast food maybe 1-2 times per week due to sometimes being in a rush. Pt reports whenever she does purchase fast food she feels bad for doing it. Reports eating hamburgers at home sometimes, but bakes them. Reports she purchases 85% lean hamburger meat. Pt wants to know if her last breakfast yesterday was ok/balanced.   Reports she used to drink milk daily but was told it was bad and so she stopped buying cow's milk and started getting oat milk for her daughter. Reports she does not like the oat milk. Pt reports drinking water or orange juice mostly.   Pt reports her daughter is going to be in the 5th grade and is looking forward to going back to school later this month.   Food Allergies/Intolerances: Shrimp: anaphylaxis. Reports 50 year old daughter is allergic to nuts.  GI Concerns: Acid reflux if eating tomato sauce, red meats. Reports still having some constipation.   Pertinent Lab Values: 04/09/19: Triglycerides: 190 HDL Cholesterol: 27 Chol/HDLC Ratio: 5.4 HgbA1c: 6.1 Hemoglobin: 11.5 (L) MCV: 74.8 (L) MCH: 24.0 (L)  Preferred Learning Style:   No preference indicated   Learning  Readiness:   Ready  MEDICATIONS: Reviewed. See list.    DIETARY INTAKE:  Usual eating pattern includes 1-2 meals and 2 snacks per day.   Common foods: green beans, kale/greens with smoked [place].  Avoided foods: shrimp due to allergy.    Typical Snacks: Cheez Its, sunflower seeds, fruit cups, chips, microwavable [place] popcorn.     Typical Beverages: 3 bottles x 16 oz water, zero calorie Powerade.   Location of Meals: table in living room.   Electronics Present at [employer]: Yes: TV   24-hr recall:  B ( AM): eggs, chicken strips, water Snk ( AM): None reported.  L ( PM):  None reported.  Snk ( PM): None reported.  D (6 PM): McDonald's McDouble cheeseburger, tomatoes, pickles, small fry, Sprite  Snk ( PM): 5 pizzas bites, water  Beverages: 48-64 oz water, Sprite   Usual physical activity: Walking.  Minutes/Week: 30 minutes x 1 day over past week. Reports her church has a walking event on Saturdays. Reports walking a lot while shopping in stores but no other organized physical activities.   Progress Towards Goal(s):  Some progress.   Nutritional Diagnosis:  NB-1.1 Food and nutrition-related knowledge deficit As related to no prior education with dietitian .  As evidenced by pt has many questions regarding what foods are healthy and acceptable for her to eat; pt reports skipping breakfast in part due to uncertainty about what she should eat .    Intervention:  Nutrition counseling provided. Praised pt for working to have  more fruits and vegetables. Reviewed balanced nutrition for meals with pt. Discussed moderation with things like fast food and not feeling bad if we do eat something that's not as nutritious for us-moderation is key and guilt is not helpful. Discussed trying 93% lean beef and trading orange juice for low fat milk. Discussed balanced breakfast ideas. Worked with pt to set a goal to gradually add more walking during the week. Discussed including good sources of  vitamin C in diet along with iron to increase absorption. Pt appeared agreeable to information/goals discussed.   Instructions/Goals: Try to include 3 meals per day. Plan out what will be quick and balanced for breakfast ahead of time.   Quick Breakfast Ideas:  Greek yogurt may add fruit   Whole grain cracker with Sunbutter OR low fat cheese may add fruit   Banana or apple with Sunbutter OR boiled eggs  Whole grain crackers or bread, boiled eggs, fruit  -Recommend trying 1% or skim milk in place of orange juice.   -Recommend trying 93% lean hamburger meat in place of 85%   Continue working to include vegetables at lunch and dinner.   For heart health, look for foods low in saturated fat and sodium:   5% or less = low and 20% or more = high  Recommend avoiding grapefruit due to risk of medication interaction.   Recommend including at least 4 bottles of water (64 oz) daily.   Make physical activity a part of your week. Try to include at least 30 minutes of physical activity 5 days each week or at least 150 minutes per week. Regular physical activity promotes overall health-including helping to reduce risk for heart disease and diabetes, promoting mental health, and helping [place] sleep better.    Starting Goal: Continue working to walk more often. Try to include 10 minutes of walking most days per week in addition to your 30 minute walks on Saturday.   -Continue with vitamin D per prescription and iron supplement. Include a good high in vitamin C when taking iron supplement (see list).   -Recommend having thyroid checked as well with labs.  Teaching Method Utilized:  Visual Auditory Hands on  Handouts given during visit include:  Anemia Nutrition Therapy   Barriers to learning/adherence to lifestyle change: None reported.   Demonstrated degree of understanding via:  Teach Back   Monitoring/Evaluation:  Dietary intake, exercise, and body weight in 1 month(s).

## 2019-11-04 ENCOUNTER — Other Ambulatory Visit: Payer: Self-pay

## 2019-11-04 ENCOUNTER — Ambulatory Visit: Payer: Medicaid Other | Admitting: Physical Therapy

## 2019-11-04 ENCOUNTER — Encounter: Payer: Self-pay | Admitting: Physical Therapy

## 2019-11-04 DIAGNOSIS — M6281 Muscle weakness (generalized): Secondary | ICD-10-CM

## 2019-11-04 DIAGNOSIS — R262 Difficulty in walking, not elsewhere classified: Secondary | ICD-10-CM

## 2019-11-04 DIAGNOSIS — M62830 Muscle spasm of back: Secondary | ICD-10-CM

## 2019-11-04 DIAGNOSIS — M545 Low back pain, unspecified: Secondary | ICD-10-CM

## 2019-11-04 NOTE — Therapy (Signed)
[place], [place], [address] Phone: [phone]   Fax:  [phone]  Physical Therapy Treatment  Patient Details  Name: Sheryl Cox MRN: [id number] Date of Birth: [birth year] Referring Provider (PT): Jordan Hawks, NP   Encounter Date: 11/04/2019   PT End of Session - 11/04/19 0927    Visit Number 2    Number of Visits 4    Date for PT Re-Evaluation 12/18/19    Authorization Type [place] MEDICAID [employer]    Authorization Time Period 10/28/19-11/13/19    Authorization - Visit Number 1    Authorization - Number of Visits 3    Progress Note Due on Visit 10    PT Start Time 0928    PT Stop Time 1012    PT Time Calculation (min) 44 min    Activity Tolerance Patient limited by pain    Behavior During Therapy [facility] for tasks assessed/performed           Past Medical History:  Diagnosis Date   Anemia    history of blood transfusion-no abnormal reaction noted   Asthma    Albuterol as needed   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+, PR+.Takes Tamoxifen daily   Diabetes mellitus without complication ([place])    takes Metformin daily   History of migraine    several yrs ago   Hypertension    takes Tribenzor daily    Past Surgical History:  Procedure Laterality Date   BIOPSY BREAST Left 12/05/12   fibroadenoma   BREAST LUMPECTOMY     2014   BREAST SURGERY Right    CESAREAN SECTION  01/05/2009   CHOLECYSTECTOMY N/A 01/31/2016   Procedure: LAPAROSCOPIC CHOLECYSTECTOMY;  Surgeon: Erroll Luna, MD;  Location: [place];  Service: General;  Laterality: N/A;   DILITATION & CURRETTAGE/HYSTROSCOPY WITH HYDROTHERMAL ABLATION N/A 08/12/2013   Procedure: DILATATION & CURETTAGE/HYSTEROSCOPY WITH HYDROTHERMAL ABLATION;  Surgeon: Frederico Hamman, MD;  Location: [facility];  Service: Gynecology;  Laterality: N/A;   ROBOTIC ASSISTED TOTAL HYSTERECTOMY N/A 06/18/2017   Procedure: XI ROBOTIC  ASSISTED TOTAL HYSTERECTOMY;  Surgeon: Lavonia Drafts, MD;  Location: [facility];  Service: Gynecology;  Laterality: N/A;   SALPINGOOPHORECTOMY Bilateral 06/18/2017   Procedure: BILATERAL SALPINGO OOPHORECTOMY;  Surgeon: Lavonia Drafts, MD;  Location: [facility];  Service: Gynecology;  Laterality: Bilateral;    There were no vitals filed for this visit.   Subjective Assessment - 11/04/19 0928    Currently in Pain? Yes    Pain Score 8     Pain Location Back    Pain Orientation Right;Lower    Pain Descriptors / Indicators Aching    Pain Type Acute pain    Pain Onset 1 to 4 weeks ago    Pain Frequency Constant    Aggravating Factors  moving around    Pain Relieving Factors heat, lying down              [facility] PT Assessment - 11/04/19 0001      Assessment   Medical Diagnosis Low back pain                         [facility] Adult PT Treatment/Exercise - 11/04/19 0001      Exercises   Exercises Lumbar      Lumbar Exercises: Stretches   Passive Hamstring Stretch Left;Right;3 reps;20 seconds   supine with strap   Single Knee to Chest Stretch Left;Right;3 reps;10 seconds  with strap in UEs to assist legs   Lower Trunk Rotation 3 reps;10 seconds   each side   Other Lumbar Stretch Exercise 10 x 5 sec leg lengtheners       Lumbar Exercises: Aerobic   Nustep L4x5' LE only      Lumbar Exercises: Supine   Pelvic Tilt 10 reps   reduced pain    Bridge 10 reps    Isometric Hip Flexion 5 seconds   stopped after ~ 5 reps d/t increased back pain     Modalities   Modalities Electrical Stimulation;Moist Heat      Moist Heat Therapy   Number Minutes Moist Heat 15 Minutes    Moist Heat Location Lumbar Spine      Electrical Stimulation   Electrical Stimulation Location lower lumbar     Electrical Stimulation Action IFC    Electrical Stimulation Parameters to tolerance    Electrical Stimulation Goals Pain                    PT Short Term Goals -  10/30/19 0734      PT SHORT TERM GOAL #1   Title Ptwill be Ind in an inital HEP    Baseline strated on IE    Time 3    Period Weeks    Status New    Target Date 11/20/19      PT SHORT TERM GOAL #2   Title Pt will voice understanding of measures to reuce and manage pain    Time 3    Period Weeks    Status New    Target Date 11/20/19             PT Long Term Goals - 10/30/19 0736      PT LONG TERM GOAL #1   Title Pt will be Ind in a final HEP    Time 7    Period Weeks    Status New    Target Date 12/18/19      PT LONG TERM GOAL #2   Title Improve bilat hamstring ROM to 50d    Baseline L 33d; R 35d    Time 7    Period Weeks    Status New    Target Date 12/18/19      PT LONG TERM GOAL #3   Title Improve trunk forward and side bending to 4 inches past knees, and to knee respectively    Baseline Se flowsheet    Time 7    Period Weeks    Status New    Target Date 12/18/19      PT LONG TERM GOAL #4   Title Pt will report improved LBP not exceeding 3/10 with daily activities    Baseline 6/10    Time 7    Period Weeks    Status New    Target Date 12/18/19      PT LONG TERM GOAL #5   Title Pt will demonstrate proper body mechanics for daily activities and lifting for improved back care    Time 7    Period Weeks    Status New    Target Date 12/18/19                 Plan - 11/04/19 1001    Clinical Impression Statement Pt limited in exercise performance d/t increased pain during treatment,  this settled down with heat and stim.  She is very tight in her low back and hips pulling  on her lumbar spine.  She would benefit from some manual work to decrease muscular tightness. Only her second visit , no goals met, however I anticipate slow progress  due to pain level and debility.    Rehab Potential Good    PT Frequency 2x / week    PT Duration 6 weeks    PT Treatment/Interventions ADLs/Self Care Home Management;Cryotherapy;Electrical  Stimulation;Ultrasound;Traction;Moist Heat;Iontophoresis 62m/ml Dexamethasone;Functional mobility training;Therapeutic activities;Therapeutic exercise;Manual techniques;Patient/family education;Passive range of motion;Dry needling;Taping    PT Next Visit Plan progress HEP    Consulted and Agree with Plan of Care Patient           Patient will benefit from skilled therapeutic intervention in order to improve the following deficits and impairments:  Difficulty walking, Decreased range of motion, Increased muscle spasms, Obesity, Decreased activity tolerance, Pain, Impaired flexibility, Decreased strength, Postural dysfunction  Visit Diagnosis: Acute midline low back pain without sciatica  Muscle weakness (generalized)  Difficulty in walking, not elsewhere classified  Muscle spasm of back     Problem List Patient Active Problem List   Diagnosis Date Noted   Chronic pain of both knees 05/21/2018   Chronic right shoulder pain 05/21/2018   BMI 40.0-44.9, adult ([place] 05/21/2018   Anemia 06/05/2013   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

## 2019-11-06 ENCOUNTER — Encounter: Payer: Self-pay | Admitting: Physical Therapy

## 2019-11-06 ENCOUNTER — Ambulatory Visit: Payer: Medicaid Other | Admitting: Physical Therapy

## 2019-11-06 ENCOUNTER — Other Ambulatory Visit: Payer: Self-pay

## 2019-11-06 DIAGNOSIS — M545 Low back pain, unspecified: Secondary | ICD-10-CM

## 2019-11-06 DIAGNOSIS — R262 Difficulty in walking, not elsewhere classified: Secondary | ICD-10-CM

## 2019-11-06 DIAGNOSIS — M62830 Muscle spasm of back: Secondary | ICD-10-CM

## 2019-11-06 DIAGNOSIS — M6281 Muscle weakness (generalized): Secondary | ICD-10-CM

## 2019-11-06 NOTE — Therapy (Signed)
[place], [place], [address] Phone: [phone]   Fax:  [phone]  Physical Therapy Treatment  Patient Details  Name: Sheryl Cox MRN: [id number] Date of Birth: 1970-10-17 Referring Provider (PT): Jordan Hawks, NP   Encounter Date: 11/06/2019   PT End of Session - 11/06/19 0931    Visit Number 3    Number of Visits 4    Date for PT Re-Evaluation 12/18/19    Authorization Type [place] MEDICAID UNITEDHEALTHCARE COMMUNITY    Authorization Time Period 10/28/19-11/13/19    Authorization - Visit Number 2    Authorization - Number of Visits 3    PT Start Time 0931    PT Stop Time 1026    PT Time Calculation (min) 55 min    Activity Tolerance Patient tolerated treatment well    Behavior During Therapy [facility] for tasks assessed/performed           Past Medical History:  Diagnosis Date  . Anemia    history of blood transfusion-no abnormal reaction noted  . Asthma    Albuterol as needed  . 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 Diabetes mellitus without complication ([place])    takes Metformin daily  . History of migraine    several yrs ago  . Hypertension    takes Tribenzor daily    Past Surgical History:  Procedure Laterality Date  . BIOPSY BREAST Left 12/05/12   fibroadenoma  . BREAST LUMPECTOMY     2014  . BREAST SURGERY Right   . CESAREAN SECTION  01/05/2009  . CHOLECYSTECTOMY N/A 01/31/2016   Procedure: LAPAROSCOPIC CHOLECYSTECTOMY;  Surgeon: Erroll Luna, MD;  Location: [place];  Service: General;  Laterality: N/A;  . DILITATION & CURRETTAGE/HYSTROSCOPY WITH HYDROTHERMAL ABLATION N/A 08/12/2013   Procedure: DILATATION & CURETTAGE/HYSTEROSCOPY WITH HYDROTHERMAL ABLATION;  Surgeon: Frederico Hamman, MD;  Location: [facility];  Service: Gynecology;  Laterality: N/A;  . ROBOTIC ASSISTED TOTAL HYSTERECTOMY N/A 06/18/2017   Procedure: XI ROBOTIC ASSISTED TOTAL  HYSTERECTOMY;  Surgeon: Lavonia Drafts, MD;  [facility];  Service: Gynecology;  Laterality: N/A;  . SALPINGOOPHORECTOMY Bilateral 06/18/2017   Procedure: BILATERAL SALPINGO OOPHORECTOMY;  Surgeon: Lavonia Drafts, MD;  [facility];  Service: Gynecology;  Laterality: Bilateral;    There were no vitals filed for this visit.   Subjective Assessment - 11/06/19 0932    Subjective Pt reports her Rt shoulder is a little sore today from using the strap to stretch her hamstrings.  Back is still hurting her    Patient Stated Goals The ain be gonein my back and manuver like I was before.    Currently in Pain? Yes    Pain Score 7     Pain Location Back    Pain Orientation Right;Lower;Left    Pain Descriptors / Indicators Aching    Pain Type Acute pain                             [facility] Adult PT Treatment/Exercise - 11/06/19 0001      Lumbar Exercises: Stretches   Passive Hamstring Stretch Left;Right;30 seconds   seated, FWD lean     Lumbar Exercises: Aerobic   Nustep L4x5' LE only      Lumbar Exercises: Supine   Clam 20 reps    Bent Knee Raise 20 reps    Isometric Hip Flexion 10 reps;5 seconds  Modalities   Modalities Electrical Stimulation;Moist Heat      Moist Heat Therapy   Number Minutes Moist Heat 15 Minutes    Moist Heat Location Lumbar Spine      Electrical Stimulation   Electrical Stimulation Location lower lumbar     Electrical Stimulation Action IFC    Electrical Stimulation Parameters to tolerance    Electrical Stimulation Goals Pain      Manual Therapy   Manual Therapy Soft tissue mobilization    Soft tissue mobilization STM with TPR to bilat gluts and piriformis - pt very tender and tight on the Rt side.                     PT Short Term Goals - 10/30/19 0734      PT SHORT TERM GOAL #1   Title Ptwill be Ind in an inital HEP    Baseline strated on IE    Time 3    Period Weeks    Status New    Target  Date 11/20/19      PT SHORT TERM GOAL #2   Title Pt will voice understanding of measures to reuce and manage pain    Time 3    Period Weeks    Status New    Target Date 11/20/19             PT Long Term Goals - 10/30/19 0736      PT LONG TERM GOAL #1   Title Pt will be Ind in a final HEP    Time 7    Period Weeks    Status New    Target Date 12/18/19      PT LONG TERM GOAL #2   Title Improve bilat hamstring ROM to 50d    Baseline L 33d; R 35d    Time 7    Period Weeks    Status New    Target Date 12/18/19      PT LONG TERM GOAL #3   Title Improve trunk forward and side bending to 4 inches past knees, and to knee respectively    Baseline Se flowsheet    Time 7    Period Weeks    Status New    Target Date 12/18/19      PT LONG TERM GOAL #4   Title Pt will report improved LBP not exceeding 3/10 with daily activities    Baseline 6/10    Time 7    Period Weeks    Status New    Target Date 12/18/19      PT LONG TERM GOAL #5   Title Pt will demonstrate proper body mechanics for daily activities and lifting for improved back care    Time 7    Period Weeks    Status New    Target Date 12/18/19                 Plan - 11/06/19 0945    Clinical Impression Statement Renne's HEP was progressed to add in some core stability work.  She tolerated it well.  She was very tender with manual work and would benefit from more of this.  She was issued information on home TENS unit as she has relief with this as well.    Rehab Potential Good    PT Frequency 2x / week    PT Duration 6 weeks    PT Treatment/Interventions ADLs/Self Care Home Management;Cryotherapy;Electrical Stimulation;Ultrasound;Traction;Moist Heat;Iontophoresis 4mg /ml Dexamethasone;Functional mobility training;Therapeutic  activities;Therapeutic exercise;Manual techniques;Patient/family education;Passive range of motion;Dry needling;Taping    PT Next Visit Plan write renewal/request for more visits    PT  Home Exercise Plan [id number]: Ardine Eng pose seated; hamstring stretch seated,[email or web address]    Consulted and Agree with Plan of Care Patient           Patient will benefit from skilled therapeutic intervention in order to improve the following deficits and impairments:  Difficulty walking, Decreased range of motion, Increased muscle spasms, Obesity, Decreased activity tolerance, Pain, Impaired flexibility, Decreased strength, Postural dysfunction  Visit Diagnosis: Acute midline low back pain without sciatica  Muscle weakness (generalized)  Difficulty in walking, not elsewhere classified  Muscle spasm of back     Problem List Patient Active Problem List   Diagnosis Date Noted  . Chronic pain of both knees 05/21/2018  . Chronic right shoulder pain 05/21/2018  . BMI 40.0-44.9, adult ([place]) 05/21/2018  . Anemia 06/05/2013  . 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 Nabothian cyst 12/07/2011  . Cervical mass 11/20/2011  . Fibroid uterus 11/20/2011    Jeral Pinch PT  11/06/2019, 10:16 AM  [facility] [address] [place], [place], [address] Phone: [phone]   Fax:  [phone]  Name: TAMBRIA PFANNENSTIEL MRN: [id number] Date of Birth: 15-Aug-1970

## 2019-11-06 NOTE — Patient Instructions (Addendum)
Access Code: [email or web address] URL: [email or web address] Date: 11/06/2019 Prepared by: Jeral Pinch  Exercises Supine Transversus Abdominis Bracing - Hands on Thighs - 3-4 x weekly - 10 reps - 10 hold Supine March - 3-4 x weekly - 2 sets - 10 reps Bent Knee Fallouts - 3-4 x weekly - 2 sets - 10 reps Bridge - 3-4 x weekly - 2 sets - 10 reps Seated Hamstring Stretch - 3-4 x weekly - 1 reps - 30-45 hold  TENS UNIT: This is helpful for muscle pain and spasm.   Search and Purchase a TENS 7000 2nd edition at www.tenspros.com. It should be less than $30.     TENS unit instructions: Do not shower or bathe with the unit on Turn the unit off before removing electrodes or batteries If the electrodes lose stickiness add a drop of water to the electrodes after they are disconnected from the unit and place on plastic sheet. If you continued to have difficulty, call the TENS unit company to purchase more electrodes. Do not apply lotion on the skin area prior to use. Make sure the skin is clean and dry as this will help prolong the life of the electrodes. After use, always check skin for unusual red areas, rash or other skin difficulties. If there are any skin problems, does not apply electrodes to the same area. Never remove the electrodes from the unit by pulling the wires. Do not use the TENS unit or electrodes other than as directed. Do not change electrode placement without consultating your therapist or physician. Keep 2 fingers with between each electrode. Wear time ratio is 2:1, on to off times.    For example on for 30 minutes off for 15 minutes and then on for 30 minutes off for 15 minutes

## 2019-11-10 ENCOUNTER — Encounter: Payer: Self-pay | Admitting: Physical Therapy

## 2019-11-10 ENCOUNTER — Other Ambulatory Visit: Payer: Self-pay

## 2019-11-10 ENCOUNTER — Ambulatory Visit: Payer: Medicaid Other | Admitting: Physical Therapy

## 2019-11-10 DIAGNOSIS — M545 Low back pain, unspecified: Secondary | ICD-10-CM

## 2019-11-10 DIAGNOSIS — M62830 Muscle spasm of back: Secondary | ICD-10-CM

## 2019-11-10 DIAGNOSIS — M6281 Muscle weakness (generalized): Secondary | ICD-10-CM

## 2019-11-10 DIAGNOSIS — R262 Difficulty in walking, not elsewhere classified: Secondary | ICD-10-CM

## 2019-11-10 NOTE — Patient Instructions (Signed)
Access Code: [email or web address] URL: [email or web address] Date: 11/10/2019 Prepared by: Hilda Blades  Exercises Supine March - 3-4 x weekly - 2 sets - 10 reps Bridge - 3-4 x weekly - 2 sets - 10 reps Clamshell - 1 x daily - 3-4 x weekly - 3 sets - 10 reps Supine Lower Trunk Rotation - 3-4 x weekly - 5 reps - 10 seconds hold Modified [place] Stretch - 3-4 x weekly - 2 reps - 30-45 seconds hold Seated Hamstring Stretch - 3-4 x weekly - 2 reps - 30-45 hold Seated Lumbar Flexion Stretch - 3-4 x weekly - 5-10 reps - 10 seconds hold

## 2019-11-10 NOTE — Therapy (Signed)
[place], [place], [address] Phone: [phone]   Fax:  [phone]  Physical Therapy Treatment  Patient Details  Name: Sheryl Cox MRN: [id number] Date of Birth: 02/24/1971 Referring Provider (PT): Jordan Hawks, NP   Encounter Date: 11/10/2019   PT End of Session - 11/10/19 1015    Visit Number 4    Number of Visits 13    Date for PT Re-Evaluation 12/18/19    Authorization Type [employer] Managed Medicaid    Authorization Time Period --    Authorization - Visit Number 3    Authorization - Number of Visits 27    Progress Note Due on Visit 10    PT Start Time 1000    PT Stop Time 1055    PT Time Calculation (min) 55 min    Activity Tolerance Patient tolerated treatment well    Behavior During Therapy [facility] for tasks assessed/performed           Past Medical History:  Diagnosis Date  . Anemia    history of blood transfusion-no abnormal reaction noted  . Asthma    Albuterol as needed  . 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 Diabetes mellitus without complication ([place])    takes Metformin daily  . History of migraine    several yrs ago  . Hypertension    takes Tribenzor daily    Past Surgical History:  Procedure Laterality Date  . BIOPSY BREAST Left 12/05/12   fibroadenoma  . BREAST LUMPECTOMY     2014  . BREAST SURGERY Right   . CESAREAN SECTION  01/05/2009  . CHOLECYSTECTOMY N/A 01/31/2016   Procedure: LAPAROSCOPIC CHOLECYSTECTOMY;  Surgeon: Erroll Luna, MD;  Location: [place];  Service: General;  Laterality: N/A;  . DILITATION & CURRETTAGE/HYSTROSCOPY WITH HYDROTHERMAL ABLATION N/A 08/12/2013   Procedure: DILATATION & CURETTAGE/HYSTEROSCOPY WITH HYDROTHERMAL ABLATION;  Surgeon: Frederico Hamman, MD;  Location: [facility];  Service: Gynecology;  Laterality: N/A;  . ROBOTIC ASSISTED TOTAL HYSTERECTOMY N/A 06/18/2017   Procedure: XI ROBOTIC ASSISTED TOTAL  HYSTERECTOMY;  Surgeon: Lavonia Drafts, MD;  Location: [facility];  Service: Gynecology;  Laterality: N/A;  . SALPINGOOPHORECTOMY Bilateral 06/18/2017   Procedure: BILATERAL SALPINGO OOPHORECTOMY;  Surgeon: Lavonia Drafts, MD;  Location: [facility];  Service: Gynecology;  Laterality: Bilateral;    There were no vitals filed for this visit.   Subjective Assessment - 11/10/19 1005    Subjective Patient reports back pain is about the same. Exercises are going ok.    Patient Stated Goals The pain be gone in my back and move like I was before.    Currently in Pain? Yes    Pain Score 7     Pain Location Back    Pain Orientation Lower    Pain Descriptors / Indicators Aching    Pain Type Acute pain    Pain Onset 1 to 4 weeks ago    Pain Frequency Constant    Aggravating Factors  Bending, lifting    Pain Relieving Factors Heat, TENS              [facility] PT Assessment - 11/10/19 0001      Assessment   Medical Diagnosis Low back pain    Referring Provider (PT) Jordan Hawks, NP      AROM   Lumbar Flexion Fingertips to mid shin, patient remains in relative lordosis      Strength   Overall Strength Comments  Core strength grossly 3+/5    Strength Assessment Site Hip    Right/Left Hip Right;Left    Right Hip Extension 3+/5    Right Hip ABduction 3+/5    Left Hip Extension 3+/5    Left Hip ABduction 3+/5                         [facility] Adult PT Treatment/Exercise - 11/10/19 0001      Exercises   Exercises Lumbar      Lumbar Exercises: Stretches   Passive Hamstring Stretch 30 seconds    Passive Hamstring Stretch Limitations seated edge of table    Single Knee to Chest Stretch 30 seconds    Single Knee to Chest Stretch Limitations supine PROM    Lower Trunk Rotation 3 reps;10 seconds    Hip Flexor Stretch 2 reps;30 seconds    Hip Flexor Stretch Limitations supine edge of mat    Piriformis Stretch 30 seconds    Piriformis Stretch Limitations supine PROM     Other Lumbar Stretch Exercise Seated lumbar flexion stretch reach to floor 5 x 10 sec      Lumbar Exercises: Aerobic   Nustep L5 x 5 min with UE and LE      Lumbar Exercises: Supine   Bent Knee Raise 20 reps   2 sets   Bridge 10 reps;2 seconds   2 sets     Lumbar Exercises: Sidelying   Clam 15 reps   2 sets     Modalities   Modalities Electrical Stimulation;Moist Heat      Moist Heat Therapy   Number Minutes Moist Heat 15 Minutes    Moist Heat Location Lumbar Spine      Electrical Stimulation   Electrical Stimulation Location Lumbar    Electrical Stimulation Action IFC 80-150 x 15 min    Electrical Stimulation Parameters Patient tolerance for intensity    Electrical Stimulation Goals Pain                  PT Education - 11/10/19 1008    Education Details HEP    Person(s) Educated Patient    Methods Explanation;Demonstration;Tactile cues;Handout;Verbal cues    Comprehension Verbalized understanding;Returned demonstration;Verbal cues required;Need further instruction;Tactile cues required            PT Short Term Goals - 10/30/19 0734      PT SHORT TERM GOAL #1   Title Ptwill be Ind in an inital HEP    Baseline strated on IE    Time 3    Period Weeks    Status New    Target Date 11/20/19      PT SHORT TERM GOAL #2   Title Pt will voice understanding of measures to reuce and manage pain    Time 3    Period Weeks    Status New    Target Date 11/20/19             PT Long Term Goals - 10/30/19 0736      PT LONG TERM GOAL #1   Title Pt will be Ind in a final HEP    Time 7    Period Weeks    Status New    Target Date 12/18/19      PT LONG TERM GOAL #2   Title Improve bilat hamstring ROM to 50d    Baseline L 33d; R 35d    Time 7    Period Weeks  Status New    Target Date 12/18/19      PT LONG TERM GOAL #3   Title Improve trunk forward and side bending to 4 inches past knees, and to knee respectively    Baseline Se flowsheet    Time 7     Period Weeks    Status New    Target Date 12/18/19      PT LONG TERM GOAL #4   Title Pt will report improved LBP not exceeding 3/10 with daily activities    Baseline 6/10    Time 7    Period Weeks    Status New    Target Date 12/18/19      PT LONG TERM GOAL #5   Title Pt will demonstrate proper body mechanics for daily activities and lifting for improved back care    Time 7    Period Weeks    Status New    Target Date 12/18/19                 Plan - 11/10/19 1052    Clinical Impression Statement Patient tolerated therapy well with no adverse effects. She continues to report persistent low back pain that seems to be musculoskeletal related without any radicular symptoms. Lumbar AROM limited and painful with increased lumbar lordosis. Progressed her lumbar mobility exercises this visit and progressed core/hip strengthening with good tolerance. Patient does require cueing for proper core activation with exercises but able to perform correctly following cues and she does not report any increase in pain. Continued with e-stim and heat following exercise to reduce back pain and patient instructed on e-stim home use if beneficial. She would benefit from continued skilled PT to progress mobility and strength to reduce pain and maximize functional ability.    PT Frequency 2x / week    PT Duration 6 weeks    PT Treatment/Interventions ADLs/Self Care Home Management;Cryotherapy;Electrical Stimulation;Ultrasound;Traction;Moist Heat;Iontophoresis 4mg /ml Dexamethasone;Functional mobility training;Therapeutic activities;Therapeutic exercise;Manual techniques;Patient/family education;Passive range of motion;Dry needling;Taping    PT Next Visit Plan Assess HEP and progress PRN, continue lumbar/hip stretching and manual, progression core/hip strengthening as tolerated    PT Home Exercise Plan [email or web address]: supine marching, bridge, side clamshell, LTR, hip flexor stretch edge of bed, seated hasmtring  stretch, seated lumbar flexion stretch    Consulted and Agree with Plan of Care Patient           Patient will benefit from skilled therapeutic intervention in order to improve the following deficits and impairments:  Difficulty walking, Decreased range of motion, Increased muscle spasms, Obesity, Decreased activity tolerance, Pain, Impaired flexibility, Decreased strength, Postural dysfunction  Visit Diagnosis: Acute midline low back pain without sciatica  Muscle weakness (generalized)  Difficulty in walking, not elsewhere classified  Muscle spasm of back     Problem List Patient Active Problem List   Diagnosis Date Noted  . Chronic pain of both knees 05/21/2018  . Chronic right shoulder pain 05/21/2018  . BMI 40.0-44.9, adult ([place]) 05/21/2018  . Anemia 06/05/2013  . 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 Nabothian cyst 12/07/2011  . Cervical mass 11/20/2011  . Fibroid uterus 11/20/2011    Hilda Blades, PT, DPT, LAT, ATC 11/10/19  11:13 AM Phone: [phone] Fax: [facility] [address] [place], [place], [address] Phone: [phone]   Fax:  [phone]  Name: Sheryl Cox MRN: [id number] Date of Birth: 06-Dec-1970

## 2019-11-12 ENCOUNTER — Ambulatory Visit: Payer: Medicaid Other

## 2019-11-12 ENCOUNTER — Other Ambulatory Visit: Payer: Self-pay

## 2019-11-12 DIAGNOSIS — M6281 Muscle weakness (generalized): Secondary | ICD-10-CM

## 2019-11-12 DIAGNOSIS — M545 Low back pain, unspecified: Secondary | ICD-10-CM

## 2019-11-12 DIAGNOSIS — M62830 Muscle spasm of back: Secondary | ICD-10-CM

## 2019-11-12 DIAGNOSIS — R262 Difficulty in walking, not elsewhere classified: Secondary | ICD-10-CM

## 2019-11-12 NOTE — Therapy (Signed)
[place], [place], [address] Phone: [phone]   Fax:  [phone]  Physical Therapy Treatment  Patient Details  Name: Sheryl Cox MRN: [id number] Date of Birth: 02/02/1971 Referring Provider (PT): Jordan Hawks, NP   Encounter Date: 11/12/2019   PT End of Session - 11/12/19 1022    Visit Number 5    Number of Visits 13    Date for PT Re-Evaluation 12/18/19    Authorization Type UHC Managed Medicaid    Authorization Time Period 10/28/19-11/13/19    Authorization - Number of Visits 27    Progress Note Due on Visit 10    PT Start Time 1010    PT Stop Time 1105    PT Time Calculation (min) 55 min    Activity Tolerance Patient tolerated treatment well    Behavior During Therapy [facility] for tasks assessed/performed           Past Medical History:  Diagnosis Date  . Anemia    history of blood transfusion-no abnormal reaction noted  . Asthma    Albuterol as needed  . 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 Diabetes mellitus without complication ([place])    takes Metformin daily  . History of migraine    several yrs ago  . Hypertension    takes Tribenzor daily    Past Surgical History:  Procedure Laterality Date  . BIOPSY BREAST Left 12/05/12   fibroadenoma  . BREAST LUMPECTOMY     2014  . BREAST SURGERY Right   . CESAREAN SECTION  01/05/2009  . CHOLECYSTECTOMY N/A 01/31/2016   Procedure: LAPAROSCOPIC CHOLECYSTECTOMY;  Surgeon: Erroll Luna, MD;  Location: [place];  Service: General;  Laterality: N/A;  . DILITATION & CURRETTAGE/HYSTROSCOPY WITH HYDROTHERMAL ABLATION N/A 08/12/2013   Procedure: DILATATION & CURETTAGE/HYSTEROSCOPY WITH HYDROTHERMAL ABLATION;  Surgeon: Frederico Hamman, MD;  Location: [facility];  Service: Gynecology;  Laterality: N/A;  . ROBOTIC ASSISTED TOTAL HYSTERECTOMY N/A 06/18/2017   Procedure: XI ROBOTIC ASSISTED TOTAL HYSTERECTOMY;  Surgeon:  Lavonia Drafts, MD;  Location: [facility];  Service: Gynecology;  Laterality: N/A;  . SALPINGOOPHORECTOMY Bilateral 06/18/2017   Procedure: BILATERAL SALPINGO OOPHORECTOMY;  Surgeon: Lavonia Drafts, MD;  Location: [facility];  Service: Gynecology;  Laterality: Bilateral;    There were no vitals filed for this visit.   Subjective Assessment - 11/12/19 1020    Subjective Pt reports trying to move a recliner on her own yesterday and aggrevated her r low back.    Patient Stated Goals The pain be gone in my back and move like I was before.    Currently in Pain? Yes    Pain Score 7     Pain Orientation Lower;Right    Pain Descriptors / Indicators Aching    Pain Type Acute pain    Pain Radiating Towards NA    Pain Onset 1 to 4 weeks ago    Pain Frequency Intermittent                             [facility] Adult PT Treatment/Exercise - 11/12/19 0001      Exercises   Exercises Lumbar      Lumbar Exercises: Stretches   Passive Hamstring Stretch 30 seconds    Passive Hamstring Stretch Limitations seated edge of table    Single Knee to Chest Stretch 30 seconds    Single Knee to Chest Stretch  Limitations supine PROM    Lower Trunk Rotation 3 reps;10 seconds    Piriformis Stretch 30 seconds    Piriformis Stretch Limitations supine PROM    Other Lumbar Stretch Exercise Seated lumbar flexion stretch reach to floor 5 x 10 sec      Lumbar Exercises: Aerobic   Nustep L5 x 5 min with UE and LE      Lumbar Exercises: Supine   Bent Knee Raise 10 reps    Bent Knee Raise Limitations 2 sets c abd engagement      Lumbar Exercises: Sidelying   Clam 15 reps   2 sets     Modalities   Modalities Electrical Stimulation;Moist Heat      Moist Heat Therapy   Number Minutes Moist Heat 15 Minutes    Moist Heat Location Lumbar Spine      Electrical Stimulation   Electrical Stimulation Location Lumbar    Electrical Stimulation Action IFC 80-150 x 15 min    Electrical  Stimulation Parameters Patient tolerance for intensity    Electrical Stimulation Goals Pain                    PT Short Term Goals - 10/30/19 0734      PT SHORT TERM GOAL #1   Title Ptwill be Ind in an inital HEP    Baseline strated on IE    Time 3    Period Weeks    Status New    Target Date 11/20/19      PT SHORT TERM GOAL #2   Title Pt will voice understanding of measures to reuce and manage pain    Time 3    Period Weeks    Status New    Target Date 11/20/19             PT Long Term Goals - 10/30/19 0736      PT LONG TERM GOAL #1   Title Pt will be Ind in a final HEP    Time 7    Period Weeks    Status New    Target Date 12/18/19      PT LONG TERM GOAL #2   Title Improve bilat hamstring ROM to 50d    Baseline L 33d; R 35d    Time 7    Period Weeks    Status New    Target Date 12/18/19      PT LONG TERM GOAL #3   Title Improve trunk forward and side bending to 4 inches past knees, and to knee respectively    Baseline Se flowsheet    Time 7    Period Weeks    Status New    Target Date 12/18/19      PT LONG TERM GOAL #4   Title Pt will report improved LBP not exceeding 3/10 with daily activities    Baseline 6/10    Time 7    Period Weeks    Status New    Target Date 12/18/19      PT LONG TERM GOAL #5   Title Pt will demonstrate proper body mechanics for daily activities and lifting for improved back care    Time 7    Period Weeks    Status New    Target Date 12/18/19                 Plan - 11/12/19 1058    Clinical Impression Statement Pt reports aggrevating R low back following  trying to move a recliner on her own. PT focused on low back and LE flexibility, as well as core strengthening. IFC and moist heat was utilized for pain management.    Personal Factors and Comorbidities Comorbidity 2    Comorbidities obesity, DM    Examination-Activity Limitations Locomotion Level;Bend;Reach Overhead;Caring for  Others;Carry;Lift;Stand;Sit;Squat;Stairs    Stability/Clinical Decision Making Stable/Uncomplicated    Clinical Decision Making Low    Rehab Potential Good    PT Frequency 2x / week    PT Duration 6 weeks    PT Treatment/Interventions ADLs/Self Care Home Management;Cryotherapy;Electrical Stimulation;Ultrasound;Traction;Moist Heat;Iontophoresis 4mg /ml Dexamethasone;Functional mobility training;Therapeutic activities;Therapeutic exercise;Manual techniques;Patient/family education;Passive range of motion;Dry needling;Taping    PT Next Visit Plan Continue to address low back and LE flexibilty and core strength. Add back strengthening exs as indicated.    PT Home Exercise Plan [email or web address]: supine marching, bridge, side clamshell, LTR, hip flexor stretch edge of bed, seated hasmtring stretch, seated lumbar flexion stretch    Consulted and Agree with Plan of Care Patient           Patient will benefit from skilled therapeutic intervention in order to improve the following deficits and impairments:  Difficulty walking, Decreased range of motion, Increased muscle spasms, Obesity, Decreased activity tolerance, Pain, Impaired flexibility, Decreased strength, Postural dysfunction  Visit Diagnosis: Acute midline low back pain without sciatica  Muscle weakness (generalized)  Difficulty in walking, not elsewhere classified  Muscle spasm of back     Problem List Patient Active Problem List   Diagnosis Date Noted  . Chronic pain of both knees 05/21/2018  . Chronic right shoulder pain 05/21/2018  . BMI 40.0-44.9, adult ([place]) 05/21/2018  . Anemia 06/05/2013  . 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 Nabothian cyst 12/07/2011  . Cervical mass 11/20/2011  . Fibroid uterus 11/20/2011    Gar Ponto MS, PT 11/12/19 11:07 AM  [facility] [address] [place], [place], [address] Phone: [phone]   Fax:   [phone]  Name: Sheryl Cox MRN: [id number] Date of Birth: 1970-08-22

## 2019-11-18 ENCOUNTER — Ambulatory Visit: Payer: Medicaid Other | Attending: Nurse Practitioner

## 2019-11-18 ENCOUNTER — Other Ambulatory Visit: Payer: Self-pay

## 2019-11-18 DIAGNOSIS — M545 Low back pain, unspecified: Secondary | ICD-10-CM

## 2019-11-18 DIAGNOSIS — M62830 Muscle spasm of back: Secondary | ICD-10-CM

## 2019-11-18 DIAGNOSIS — M6281 Muscle weakness (generalized): Secondary | ICD-10-CM

## 2019-11-18 DIAGNOSIS — R262 Difficulty in walking, not elsewhere classified: Secondary | ICD-10-CM | POA: Insufficient documentation

## 2019-11-18 NOTE — Therapy (Signed)
[place], [place], [address] Phone: [phone]   Fax:  [phone]  Physical Therapy Treatment  Patient Details  Name: Sheryl Cox MRN: [id number] Date of Birth: Sep 01, 1970 Referring Provider (PT): Jordan Hawks, NP   Encounter Date: 11/18/2019   PT End of Session - 11/18/19 1022    Visit Number 6    Number of Visits 13    Date for PT Re-Evaluation 12/18/19    Authorization Type UHC Managed Medicaid    Authorization - Number of Visits 27    Progress Note Due on Visit 10    PT Start Time 1013    PT Stop Time 1114    PT Time Calculation (min) 61 min    Activity Tolerance Patient tolerated treatment well    Behavior During Therapy WFL for tasks assessed/performed           Past Medical History:  Diagnosis Date   Anemia    history of blood transfusion-no abnormal reaction noted   Asthma    Albuterol as needed   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+, PR+.Takes Tamoxifen daily   Diabetes mellitus without complication ([place])    takes Metformin daily   History of migraine    several yrs ago   Hypertension    takes Tribenzor daily    Past Surgical History:  Procedure Laterality Date   BIOPSY BREAST Left 12/05/12   fibroadenoma   BREAST LUMPECTOMY     2014   BREAST SURGERY Right    CESAREAN SECTION  01/05/2009   CHOLECYSTECTOMY N/A 01/31/2016   Procedure: LAPAROSCOPIC CHOLECYSTECTOMY;  Surgeon: Erroll Luna, MD;  Location: [facility];  Service: General;  Laterality: N/A;   DILITATION & CURRETTAGE/HYSTROSCOPY WITH HYDROTHERMAL ABLATION N/A 08/12/2013   Procedure: DILATATION & CURETTAGE/HYSTEROSCOPY WITH HYDROTHERMAL ABLATION;  Surgeon: Frederico Hamman, MD;  Location: [facility];  Service: Gynecology;  Laterality: N/A;   ROBOTIC ASSISTED TOTAL HYSTERECTOMY N/A 06/18/2017   Procedure: XI ROBOTIC ASSISTED TOTAL HYSTERECTOMY;  Surgeon: Lavonia Drafts, MD;  Location: [facility];   Service: Gynecology;  Laterality: N/A;   SALPINGOOPHORECTOMY Bilateral 06/18/2017   Procedure: BILATERAL SALPINGO OOPHORECTOMY;  Surgeon: Lavonia Drafts, MD;  Location: [facility];  Service: Gynecology;  Laterality: Bilateral;    There were no vitals filed for this visit.   Subjective Assessment - 11/18/19 1023    Subjective Pt reports she is gradually improving with less pain and being able to bend more easily. Pt reports she obtained a TENS unit.    Patient Stated Goals The pain be gone in my back and move like I was before.    Currently in Pain? Yes    Pain Score 6     Pain Location Back    Pain Orientation Mid;Lower;Lateral;Right    Pain Descriptors / Indicators Aching    Pain Type Acute pain    Pain Onset 1 to 4 weeks ago    Pain Frequency Intermittent    Aggravating Factors  Bending and lifting    Pain Relieving Factors Heat, TENS                             [facility] Adult PT Treatment/Exercise - 11/18/19 0001      Exercises   Exercises Lumbar      Lumbar Exercises: Stretches   Passive Hamstring Stretch 30 seconds    Passive Hamstring Stretch Limitations seated edge of table  Other Lumbar Stretch Exercise Seated lumbar flexion stretch reach to floor 2 x 10 sec; forward and laterally      Lumbar Exercises: Aerobic   Nustep L5 x 5 min with UE and LE      Lumbar Exercises: Standing   Wall Slides 10 reps;2 seconds    Wall Slides Limitations 2 sets    Scapular Retraction Strengthening;Both;15 reps    Scapular Retraction Limitations 2 sets    Shoulder Extension Strengthening;Both;15 reps    Shoulder Extension Limitations 2 sets      Lumbar Exercises: Supine   Pelvic Tilt 10 reps    Pelvic Tilt Limitations 3 sec    Bent Knee Raise 10 reps    Bent Knee Raise Limitations 2 sets c abd engagement      Modalities   Modalities Electrical Stimulation;Moist Heat      Moist Heat Therapy   Number Minutes Moist Heat 20 Minutes    Moist Heat Location  Lumbar Spine      Electrical Stimulation   Electrical Stimulation Location Lumbar    Electrical Stimulation Action IFC 80-150 x 20 min    Electrical Stimulation Parameters Patient tolerance for intensity; 22    Electrical Stimulation Goals Pain                  PT Education - 11/18/19 1306    Education Details HEP    Person(s) Educated Patient    Methods Explanation;Demonstration;Tactile cues;Verbal cues;Handout    Comprehension Verbalized understanding;Returned demonstration;Verbal cues required;Tactile cues required;Need further instruction            PT Short Term Goals - 10/30/19 0734      PT SHORT TERM GOAL #1   Title Ptwill be Ind in an inital HEP    Baseline strated on IE    Time 3    Period Weeks    Status New    Target Date 11/20/19      PT SHORT TERM GOAL #2   Title Pt will voice understanding of measures to reuce and manage pain    Time 3    Period Weeks    Status New    Target Date 11/20/19             PT Long Term Goals - 10/30/19 0736      PT LONG TERM GOAL #1   Title Pt will be Ind in a final HEP    Time 7    Period Weeks    Status New    Target Date 12/18/19      PT LONG TERM GOAL #2   Title Improve bilat hamstring ROM to 50d    Baseline L 33d; R 35d    Time 7    Period Weeks    Status New    Target Date 12/18/19      PT LONG TERM GOAL #3   Title Improve trunk forward and side bending to 4 inches past knees, and to knee respectively    Baseline Se flowsheet    Time 7    Period Weeks    Status New    Target Date 12/18/19      PT LONG TERM GOAL #4   Title Pt will report improved LBP not exceeding 3/10 with daily activities    Baseline 6/10    Time 7    Period Weeks    Status New    Target Date 12/18/19      PT LONG TERM GOAL #5  Title Pt will demonstrate proper body mechanics for daily activities and lifting for improved back care    Time 7    Period Weeks    Status New    Target Date 12/18/19                  Plan - 11/18/19 1311    Clinical Impression Statement Pt's reports indicates improvement in pain and functional movement. Back strengthening exs were added to ther ex program toay and to the pt's HEP. Pt will continue to benefit from PT for pain management, core/back and hip strengthening, and back/LE flexibility to maximize functional activities.    Personal Factors and Comorbidities Comorbidity 2    Comorbidities obesity, DM    Examination-Activity Limitations Locomotion Level;Bend;Reach Overhead;Caring for Others;Carry;Lift;Stand;Sit;Squat;Stairs    Stability/Clinical Decision Making Stable/Uncomplicated    Clinical Decision Making Low    Rehab Potential Good    PT Frequency 2x / week    PT Duration 6 weeks    PT Treatment/Interventions ADLs/Self Care Home Management;Cryotherapy;Electrical Stimulation;Ultrasound;Traction;Moist Heat;Iontophoresis 4mg /ml Dexamethasone;Functional mobility training;Therapeutic activities;Therapeutic exercise;Manual techniques;Patient/family education;Passive range of motion;Dry needling;Taping    PT Next Visit Plan Re-assess trunk and hamstring ROM. Continue to address low back and LE flexibilty and core strength.    PT Home Exercise Plan JCNVTFAE: supine marching, bridge, side clamshell, LTR, hip flexor stretch edge of bed, seated hasmtring stretch, seated lumbar flexion stretch    Consulted and Agree with Plan of Care Patient           Patient will benefit from skilled therapeutic intervention in order to improve the following deficits and impairments:  Difficulty walking, Decreased range of motion, Increased muscle spasms, Obesity, Decreased activity tolerance, Pain, Impaired flexibility, Decreased strength, Postural dysfunction  Visit Diagnosis: Acute midline low back pain without sciatica  Muscle weakness (generalized)  Difficulty in walking, not elsewhere classified  Muscle spasm of back     Problem List Patient Active Problem  List   Diagnosis Date Noted   Chronic pain of both knees 05/21/2018   Chronic right shoulder pain 05/21/2018   BMI 40.0-44.9, adult ([place]) 05/21/2018   Anemia 06/05/2013   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   Nabothian cyst 12/07/2011   Cervical mass 11/20/2011   Fibroid uterus 11/20/2011    Gar Ponto MS, PT 11/18/19 1:23 PM  [facility] [address] [place], [place], [address] Phone: [phone]   Fax:  [phone]  Name: MERAL GEISSINGER MRN: [id number] Date of Birth: [birth year]

## 2019-11-20 ENCOUNTER — Ambulatory Visit: Payer: Medicaid Other

## 2019-11-20 ENCOUNTER — Other Ambulatory Visit: Payer: Self-pay

## 2019-11-20 DIAGNOSIS — R262 Difficulty in walking, not elsewhere classified: Secondary | ICD-10-CM

## 2019-11-20 DIAGNOSIS — M6281 Muscle weakness (generalized): Secondary | ICD-10-CM

## 2019-11-20 DIAGNOSIS — M545 Low back pain, unspecified: Secondary | ICD-10-CM

## 2019-11-20 DIAGNOSIS — M62830 Muscle spasm of back: Secondary | ICD-10-CM

## 2019-11-21 NOTE — Therapy (Signed)
[place], [place], [address] Phone: [phone]   Fax:  [phone]  Physical Therapy Treatment  Patient Details  Name: Sheryl Cox MRN: [id number] Date of Birth: April 02, 1970 Referring Provider (PT): Jordan Hawks, NP   Encounter Date: 11/20/2019   PT End of Session - 11/21/19 1805    Visit Number 7    Number of Visits 13    Date for PT Re-Evaluation 12/18/19    Authorization Type UHC Managed Medicaid    Authorization Time Period 10/28/19-11/13/19    Authorization - Number of Visits 27    Progress Note Due on Visit 10    PT Start Time 0930    PT Stop Time 1030    PT Time Calculation (min) 60 min    Activity Tolerance Patient tolerated treatment well    Behavior During Therapy [facility] for tasks assessed/performed           Past Medical History:  Diagnosis Date  . Anemia    history of blood transfusion-no abnormal reaction noted  . Asthma    Albuterol as needed  . 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 Diabetes mellitus without complication ([place])    takes Metformin daily  . History of migraine    several yrs ago  . Hypertension    takes Tribenzor daily    Past Surgical History:  Procedure Laterality Date  . BIOPSY BREAST Left 12/05/12   fibroadenoma  . BREAST LUMPECTOMY     2014  . BREAST SURGERY Right   . CESAREAN SECTION  01/05/2009  . CHOLECYSTECTOMY N/A 01/31/2016   Procedure: LAPAROSCOPIC CHOLECYSTECTOMY;  Surgeon: Erroll Luna, MD;  Location: [facility];  Service: General;  Laterality: N/A;  . DILITATION & CURRETTAGE/HYSTROSCOPY WITH HYDROTHERMAL ABLATION N/A 08/12/2013   Procedure: DILATATION & CURETTAGE/HYSTEROSCOPY WITH HYDROTHERMAL ABLATION;  Surgeon: Frederico Hamman, MD;  Location: [facility];  Service: Gynecology;  Laterality: N/A;  . ROBOTIC ASSISTED TOTAL HYSTERECTOMY N/A 06/18/2017   Procedure: XI ROBOTIC ASSISTED TOTAL HYSTERECTOMY;  Surgeon:  Lavonia Drafts, MD;  Location: [facility];  Service: Gynecology;  Laterality: N/A;  . SALPINGOOPHORECTOMY Bilateral 06/18/2017   Procedure: BILATERAL SALPINGO OOPHORECTOMY;  Surgeon: Lavonia Drafts, MD;  Location[facility];  Service: Gynecology;  Laterality: Bilateral;    There were no vitals filed for this visit.                      [place] Adult PT Treatment/Exercise - 11/21/19 0001      Lumbar Exercises: Stretches   Passive Hamstring Stretch 30 seconds    Passive Hamstring Stretch Limitations seated edge of table    Lower Trunk Rotation 3 reps;10 seconds    Hip Flexor Stretch 1 rep;30 seconds    Hip Flexor Stretch Limitations supine edge of mat    Piriformis Stretch 30 seconds    Piriformis Stretch Limitations supine PROM    Other Lumbar Stretch Exercise Seated lumbar flexion stretch reach to floor 2 x 15 sec; forward and laterally      Lumbar Exercises: Aerobic   Nustep L5 x 5 min with UE and LE      Lumbar Exercises: Standing   Other Standing Lumbar Exercises Hip ext c PPT 10x      Lumbar Exercises: Supine   Bent Knee Raise 10 reps    Bent Knee Raise Limitations 2 sets c abd engagement    Bridge 10 reps;2 seconds   2  sets   Bridge Limitations c PPT      Lumbar Exercises: Sidelying   Clam 15 reps      Modalities   Modalities Electrical Stimulation;Moist Heat      Moist Heat Therapy   Number Minutes Moist Heat 15 Minutes    Moist Heat Location Lumbar Spine      Electrical Stimulation   Electrical Stimulation Location lumbar    Electrical Stimulation Goals Pain                    PT Short Term Goals - 10/30/19 0734      PT SHORT TERM GOAL #1   Title Ptwill be Ind in an inital HEP    Baseline strated on IE    Time 3    Period Weeks    Status New    Target Date 11/20/19      PT SHORT TERM GOAL #2   Title Pt will voice understanding of measures to reuce and manage pain    Time 3    Period Weeks    Status New    Target  Date 11/20/19             PT Long Term Goals - 10/30/19 0736      PT LONG TERM GOAL #1   Title Pt will be Ind in a final HEP    Time 7    Period Weeks    Status New    Target Date 12/18/19      PT LONG TERM GOAL #2   Title Improve bilat hamstring ROM to 50d    Baseline L 33d; R 35d    Time 7    Period Weeks    Status New    Target Date 12/18/19      PT LONG TERM GOAL #3   Title Improve trunk forward and side bending to 4 inches past knees, and to knee respectively    Baseline Se flowsheet    Time 7    Period Weeks    Status New    Target Date 12/18/19      PT LONG TERM GOAL #4   Title Pt will report improved LBP not exceeding 3/10 with daily activities    Baseline 6/10    Time 7    Period Weeks    Status New    Target Date 12/18/19      PT LONG TERM GOAL #5   Title Pt will demonstrate proper body mechanics for daily activities and lifting for improved back care    Time 7    Period Weeks    Status New    Target Date 12/18/19                 Plan - 11/21/19 1828    Clinical Impression Statement Pt purchased a TENs unit and brought it in for today's PT session. Pt was educated in the set up and application of the TENs unit using the modulated mode. PT continued ther ex for LE and low back flexibility and hip, low back, and core strengthening. With exs, ambulation, sit t/f stand, and mobility on the mat table, pt demonstrates smooth and cordinated movements of her trunk.    Personal Factors and Comorbidities Comorbidity 2    Comorbidities obesity, DM    Examination-Activity Limitations Locomotion Level;Bend;Reach Overhead;Caring for Others;Carry;Lift;Stand;Sit;Squat;Stairs    Stability/Clinical Decision Making Stable/Uncomplicated    Clinical Decision Making Low    Rehab Potential Good  PT Frequency 2x / week    PT Duration 6 weeks    PT Treatment/Interventions ADLs/Self Care Home Management;Cryotherapy;Electrical Stimulation;Ultrasound;Traction;Moist  Heat;Iontophoresis 4mg /ml Dexamethasone;Functional mobility training;Therapeutic activities;Therapeutic exercise;Manual techniques;Patient/family education;Passive range of motion;Dry needling;Taping    PT Next Visit Plan Re-assess trunk and hamstring ROM. Continue to address low back and LE flexibilty and core strength.    PT Home Exercise Plan JCNVTFAE: supine marching, bridge, side clamshell, LTR, hip flexor stretch edge of bed, seated hasmtring stretch, seated lumbar flexion stretch, hip ext in standing c abd engagement    Consulted and Agree with Plan of Care Patient           Patient will benefit from skilled therapeutic intervention in order to improve the following deficits and impairments:  Difficulty walking, Decreased range of motion, Increased muscle spasms, Obesity, Decreased activity tolerance, Pain, Impaired flexibility, Decreased strength, Postural dysfunction  Visit Diagnosis: Acute midline low back pain without sciatica  Muscle weakness (generalized)  Difficulty in walking, not elsewhere classified  Muscle spasm of back     Problem List Patient Active Problem List   Diagnosis Date Noted  . Chronic pain of both knees 05/21/2018  . Chronic right shoulder pain 05/21/2018  . BMI 40.0-44.9, adult ([place]) 05/21/2018  . Anemia 06/05/2013  . 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 Nabothian cyst 12/07/2011  . Cervical mass 11/20/2011  . Fibroid uterus 11/20/2011    Gar Ponto MS, PT 11/21/19 6:45 PM  [place] [address] [place], [place], [address] Phone: [phone]   Fax:  [phone]  Name: Sheryl Cox MRN: [id number] Date of Birth: 11-05-1970

## 2019-11-24 ENCOUNTER — Ambulatory Visit: Payer: Medicaid Other | Admitting: Physical Therapy

## 2019-11-24 ENCOUNTER — Other Ambulatory Visit: Payer: Self-pay

## 2019-11-24 ENCOUNTER — Encounter: Payer: Self-pay | Admitting: Physical Therapy

## 2019-11-24 DIAGNOSIS — M62830 Muscle spasm of back: Secondary | ICD-10-CM

## 2019-11-24 DIAGNOSIS — M545 Low back pain, unspecified: Secondary | ICD-10-CM

## 2019-11-24 DIAGNOSIS — M6281 Muscle weakness (generalized): Secondary | ICD-10-CM

## 2019-11-24 DIAGNOSIS — R262 Difficulty in walking, not elsewhere classified: Secondary | ICD-10-CM

## 2019-11-24 NOTE — Therapy (Signed)
[place], [place], [address] Phone: [phone]   Fax:  [phone]  Physical Therapy Treatment  Patient Details  Name: Sheryl Cox MRN: [id number] Date of Birth: [birth year] Referring Provider (PT): Jordan Hawks, NP   Encounter Date: 11/24/2019   PT End of Session - 11/24/19 0925    Visit Number 8    Number of Visits 13    Date for PT Re-Evaluation 12/18/19    Authorization Type UHC Managed Medicaid    Authorization - Visit Number 7    Authorization - Number of Visits 27    PT Start Time 0919    PT Stop Time 0959    PT Time Calculation (min) 40 min    Activity Tolerance Patient tolerated treatment well    Behavior During Therapy [facility] for tasks assessed/performed           Past Medical History:  Diagnosis Date  . Anemia    history of blood transfusion-no abnormal reaction noted  . Asthma    Albuterol as needed  . 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 Diabetes mellitus without complication ([place])    takes Metformin daily  . History of migraine    several yrs ago  . Hypertension    takes Tribenzor daily    Past Surgical History:  Procedure Laterality Date  . BIOPSY BREAST Left 12/05/12   fibroadenoma  . BREAST LUMPECTOMY     2014  . BREAST SURGERY Right   . CESAREAN SECTION  01/05/2009  . CHOLECYSTECTOMY N/A 01/31/2016   Procedure: LAPAROSCOPIC CHOLECYSTECTOMY;  Surgeon: Erroll Luna, MD;  Location: [facility];  Service: General;  Laterality: N/A;  . DILITATION & CURRETTAGE/HYSTROSCOPY WITH HYDROTHERMAL ABLATION N/A 08/12/2013   Procedure: DILATATION & CURETTAGE/HYSTEROSCOPY WITH HYDROTHERMAL ABLATION;  Surgeon: Frederico Hamman, MD;  Location: [facility];  Service: Gynecology;  Laterality: N/A;  . ROBOTIC ASSISTED TOTAL HYSTERECTOMY N/A 06/18/2017   Procedure: XI ROBOTIC ASSISTED TOTAL HYSTERECTOMY;  Surgeon: Lavonia Drafts, MD;  [facility];   Service: Gynecology;  Laterality: N/A;  . SALPINGOOPHORECTOMY Bilateral 06/18/2017   Procedure: BILATERAL SALPINGO OOPHORECTOMY;  Surgeon: Lavonia Drafts, MD;  [facility];  Service: Gynecology;  Laterality: Bilateral;    There were no vitals filed for this visit.   Subjective Assessment - 11/24/19 0920    Subjective Patient reports she is sore today, she went to the zoo and did a lot of walking. She has used the TENS unit at home with good benefit.    Patient Stated Goals The pain be gone in my back and move like I was before.    Currently in Pain? Yes    Pain Score 7     Pain Location Back    Pain Orientation Lower    Pain Descriptors / Indicators Sore    Pain Type Acute pain    Pain Onset More than a month ago    Pain Frequency Intermittent              [facility] PT Assessment - 11/24/19 0001      Assessment   Medical Diagnosis Low back pain    Referring Provider (PT) Jordan Hawks, NP      Strength   Right Hip Extension 4-/5    Right Hip ABduction 3+/5    Left Hip Extension 4-/5    Left Hip ABduction 3+/5  [place] Adult PT Treatment/Exercise - 11/24/19 0001      Lumbar Exercises: Stretches   Lower Trunk Rotation 5 reps;10 seconds      Lumbar Exercises: Aerobic   Recumbent Bike L2 x 4 min      Lumbar Exercises: Standing   Lifting Limitations Deadlift with 15# from 6" box 4x6      Lumbar Exercises: Supine   Bridge 10 reps;3 seconds   2 sets   Other Supine Lumbar Exercises 90-90 alteranting heel taps 3x5      Lumbar Exercises: Sidelying   Clam 15 reps   2 sets     Lumbar Exercises: Quadruped   Straight Leg Raise 5 reps;2 seconds   3 sets                 PT Education - 11/24/19 0924    Education Details HEP, soreness following increased activity/walking is normal    Person(s) Educated Patient    Methods Explanation    Comprehension Verbalized understanding;Need further instruction            PT  Short Term Goals - 11/24/19 0955      PT SHORT TERM GOAL #1   Title Ptwill be Ind in an inital HEP    Baseline strated on IE    Time 3    Period Weeks    Status On-going    Target Date 11/20/19      PT SHORT TERM GOAL #2   Title Pt will voice understanding of measures to reuce and manage pain    Time 3    Period Weeks    Status Achieved    Target Date 11/20/19             PT Long Term Goals - 10/30/19 0736      PT LONG TERM GOAL #1   Title Pt will be Ind in a final HEP    Time 7    Period Weeks    Status New    Target Date 12/18/19      PT LONG TERM GOAL #2   Title Improve bilat hamstring ROM to 50d    Baseline L 33d; R 35d    Time 7    Period Weeks    Status New    Target Date 12/18/19      PT LONG TERM GOAL #3   Title Improve trunk forward and side bending to 4 inches past knees, and to knee respectively    Baseline Se flowsheet    Time 7    Period Weeks    Status New    Target Date 12/18/19      PT LONG TERM GOAL #4   Title Pt will report improved LBP not exceeding 3/10 with daily activities    Baseline 6/10    Time 7    Period Weeks    Status New    Target Date 12/18/19      PT LONG TERM GOAL #5   Title Pt will demonstrate proper body mechanics for daily activities and lifting for improved back care    Time 7    Period Weeks    Status New    Target Date 12/18/19                 Plan - 11/24/19 0926    Clinical Impression Statement Patient tolerated therapy well with no adverse effects. She does continue to exhibit gross strength deficit leading to activity intolerance and increased lower back  pain, no radicular symptoms noted. Continued focus on core/hip strengthening this visit and incorporated deadlift to promote back strengthening with good tolerance. She would benefit from continued skilled PT to progress mobility and strength to reduce pain and maximize functional ability.    PT Treatment/Interventions ADLs/Self Care Home  Management;Cryotherapy;Electrical Stimulation;Ultrasound;Traction;Moist Heat;Iontophoresis 4mg /ml Dexamethasone;Functional mobility training;Therapeutic activities;Therapeutic exercise;Manual techniques;Patient/family education;Passive range of motion;Dry needling;Taping    PT Next Visit Plan Re-assess trunk and hamstring ROM. Continue to address low back and LE flexibilty and core strength.    PT Home Exercise Plan JCNVTFAE: supine marching, bridge, side clamshell, LTR, hip flexor stretch edge of bed, seated hasmtring stretch, seated lumbar flexion stretch, hip ext in standing c abd engagement    Consulted and Agree with Plan of Care Patient           Patient will benefit from skilled therapeutic intervention in order to improve the following deficits and impairments:  Difficulty walking, Decreased range of motion, Increased muscle spasms, Obesity, Decreased activity tolerance, Pain, Impaired flexibility, Decreased strength, Postural dysfunction  Visit Diagnosis: Acute midline low back pain without sciatica  Muscle weakness (generalized)  Difficulty in walking, not elsewhere classified  Muscle spasm of back     Problem List Patient Active Problem List   Diagnosis Date Noted  . Chronic pain of both knees 05/21/2018  . Chronic right shoulder pain 05/21/2018  . BMI 40.0-44.9, adult ([place]) 05/21/2018  . Anemia 06/05/2013  . 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 Nabothian cyst 12/07/2011  . Cervical mass 11/20/2011  . Fibroid uterus 11/20/2011    Hilda Blades, PT, DPT, LAT, ATC 11/24/19  10:01 AM Phone: [phone] Fax: [facility] [address] [place], [place], [address] Phone: [phone]   Fax:  [phone]  Name: Sheryl Cox MRN: [id number] Date of Birth: Sep 10, 1970

## 2019-11-26 ENCOUNTER — Other Ambulatory Visit: Payer: Self-pay

## 2019-11-26 ENCOUNTER — Ambulatory Visit: Payer: Medicaid Other | Admitting: Physical Therapy

## 2019-11-26 ENCOUNTER — Encounter: Payer: Self-pay | Admitting: Physical Therapy

## 2019-11-26 DIAGNOSIS — M545 Low back pain, unspecified: Secondary | ICD-10-CM

## 2019-11-26 DIAGNOSIS — M62830 Muscle spasm of back: Secondary | ICD-10-CM

## 2019-11-26 DIAGNOSIS — R262 Difficulty in walking, not elsewhere classified: Secondary | ICD-10-CM

## 2019-11-26 DIAGNOSIS — M6281 Muscle weakness (generalized): Secondary | ICD-10-CM

## 2019-11-26 NOTE — Therapy (Signed)
[place], [place], [address] Phone: [phone]   Fax:  [phone]  Physical Therapy Treatment  Patient Details  Name: Sheryl Cox MRN: [id number] Date of Birth: 05/11/1970 Referring Provider (PT): Jordan Hawks, NP   Encounter Date: 11/26/2019   PT End of Session - 11/26/19 0908    Visit Number 9    Number of Visits 13    Date for PT Re-Evaluation 12/18/19    Authorization Type [employer] Managed Medicaid    Authorization - Visit Number 8    Authorization - Number of Visits 27    PT Start Time 0900    PT Stop Time 0940    PT Time Calculation (min) 40 min    Activity Tolerance Patient tolerated treatment well    Behavior During Therapy [facility] for tasks assessed/performed           Past Medical History:  Diagnosis Date  . Anemia    history of blood transfusion-no abnormal reaction noted  . Asthma    Albuterol as needed  . 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 Diabetes mellitus without complication ([place])    takes Metformin daily  . History of migraine    several yrs ago  . Hypertension    takes Tribenzor daily    Past Surgical History:  Procedure Laterality Date  . BIOPSY BREAST Left 12/05/12   fibroadenoma  . BREAST LUMPECTOMY     2014  . BREAST SURGERY Right   . CESAREAN SECTION  01/05/2009  . CHOLECYSTECTOMY N/A 01/31/2016   Procedure: LAPAROSCOPIC CHOLECYSTECTOMY;  Surgeon: Erroll Luna, MD;  Location: [place];  Service: General;  Laterality: N/A;  . DILITATION & CURRETTAGE/HYSTROSCOPY WITH HYDROTHERMAL ABLATION N/A 08/12/2013   Procedure: DILATATION & CURETTAGE/HYSTEROSCOPY WITH HYDROTHERMAL ABLATION;  Surgeon: Frederico Hamman, MD;  Location: [facility];  Service: Gynecology;  Laterality: N/A;  . ROBOTIC ASSISTED TOTAL HYSTERECTOMY N/A 06/18/2017   Procedure: XI ROBOTIC ASSISTED TOTAL HYSTERECTOMY;  Surgeon: Lavonia Drafts, MD;  Location: [facility];   Service: Gynecology;  Laterality: N/A;  . SALPINGOOPHORECTOMY Bilateral 06/18/2017   Procedure: BILATERAL SALPINGO OOPHORECTOMY;  Surgeon: Lavonia Drafts, MD;  Location: [facility];  Service: Gynecology;  Laterality: Bilateral;    There were no vitals filed for this visit.   Subjective Assessment - 11/26/19 0902    Subjective Patient reports everything is going good since last visit. States her knees are bothering her a little bit, probably because of the weather.    Patient Stated Goals The pain be gone in my back and move like I was before.    Currently in Pain? Yes    Pain Score 6     Pain Location Back    Pain Orientation Lower    Pain Descriptors / Indicators Sore    Pain Type Acute pain    Pain Onset More than a month ago    Pain Frequency Intermittent                             [facility] Adult PT Treatment/Exercise - 11/26/19 0001      Lumbar Exercises: Stretches   Single Knee to Chest Stretch 30 seconds    Single Knee to Chest Stretch Limitations supine PROM    Lower Trunk Rotation 5 reps;10 seconds    Piriformis Stretch 30 seconds    Piriformis Stretch Limitations supine PROM  Lumbar Exercises: Aerobic   UBE (Upper Arm Bike) L5 x 5 min (LE only)      Lumbar Exercises: Standing   Lifting Limitations Deadlift with 15# from 6" box 2x8      Lumbar Exercises: Seated   Sit to Stand 10 reps      Lumbar Exercises: Supine   Bridge 10 reps;3 seconds   2 sets   Straight Leg Raise 10 reps   2 sets   Other Supine Lumbar Exercises 90-90 alteranting heel taps 3x5      Lumbar Exercises: Sidelying   Hip Abduction 10 reps   2 sets     Lumbar Exercises: Quadruped   Straight Leg Raise 5 reps;2 seconds   2 sets                 PT Education - 11/26/19 0908    Education Details HEP    Person(s) Educated Patient    Methods Explanation    Comprehension Verbalized understanding;Need further instruction            PT Short Term Goals -  11/24/19 0955      PT SHORT TERM GOAL #1   Title Ptwill be Ind in an inital HEP    Baseline strated on IE    Time 3    Period Weeks    Status On-going    Target Date 11/20/19      PT SHORT TERM GOAL #2   Title Pt will voice understanding of measures to reuce and manage pain    Time 3    Period Weeks    Status Achieved    Target Date 11/20/19             PT Long Term Goals - 10/30/19 0736      PT LONG TERM GOAL #1   Title Pt will be Ind in a final HEP    Time 7    Period Weeks    Status New    Target Date 12/18/19      PT LONG TERM GOAL #2   Title Improve bilat hamstring ROM to 50d    Baseline L 33d; R 35d    Time 7    Period Weeks    Status New    Target Date 12/18/19      PT LONG TERM GOAL #3   Title Improve trunk forward and side bending to 4 inches past knees, and to knee respectively    Baseline Se flowsheet    Time 7    Period Weeks    Status New    Target Date 12/18/19      PT LONG TERM GOAL #4   Title Pt will report improved LBP not exceeding 3/10 with daily activities    Baseline 6/10    Time 7    Period Weeks    Status New    Target Date 12/18/19      PT LONG TERM GOAL #5   Title Pt will demonstrate proper body mechanics for daily activities and lifting for improved back care    Time 7    Period Weeks    Status New    Target Date 12/18/19                 Plan - 11/26/19 0910    Clinical Impression Statement Patient tolerated therapy well with no adverse effects. Continued with core and hip strengthening with good tolerance. Patient does report lower back ache while performing exercises but  she does exhibit increased lumbar lordosis with decreased lumbopelvic control. Patient expressed interest in pool therapy this visit and she was instructed to look for access to a pool so if she attended aquatic PT she would be able to use that program independently. Patient reported she is in the process of accessing [facility] pool and she was encouraged  to reach out to her PCP to have note written for medical necessity. She would benefit from continued skilled PT to progress mobility and strength to reduce pain and maximize functional ability.    PT Treatment/Interventions ADLs/Self Care Home Management;Cryotherapy;Electrical Stimulation;Ultrasound;Traction;Moist Heat;Iontophoresis 4mg /ml Dexamethasone;Functional mobility training;Therapeutic activities;Therapeutic exercise;Manual techniques;Patient/family education;Passive range of motion;Dry needling;Taping    PT Next Visit Plan Re-assess trunk and hamstring ROM. Continue to address low back and LE flexibilty and core strength.    PT Home Exercise Plan [id number]: supine marching, bridge, side clamshell, LTR, hip flexor stretch edge of bed, seated hasmtring stretch, seated lumbar flexion stretch, hip ext in standing c abd engagement    Consulted and Agree with Plan of Care Patient           Patient will benefit from skilled therapeutic intervention in order to improve the following deficits and impairments:  Difficulty walking, Decreased range of motion, Increased muscle spasms, Obesity, Decreased activity tolerance, Pain, Impaired flexibility, Decreased strength, Postural dysfunction  Visit Diagnosis: Acute midline low back pain without sciatica  Muscle weakness (generalized)  Difficulty in walking, not elsewhere classified  Muscle spasm of back     Problem List Patient Active Problem List   Diagnosis Date Noted  . Chronic pain of both knees 05/21/2018  . Chronic right shoulder pain 05/21/2018  . BMI 40.0-44.9, adult ([place]) 05/21/2018  . Anemia 06/05/2013  . 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 Nabothian cyst 12/07/2011  . Cervical mass 11/20/2011  . Fibroid uterus 11/20/2011    Hilda Blades, PT, DPT, LAT, ATC 11/26/19  9:47 AM Phone: [phone] Fax: [facility] [address] [place], [place], [address] Phone: [phone]   Fax:  [phone]  Name: AIREONA TORELLI MRN: [id number] Date of Birth: 1970-04-06

## 2019-12-01 ENCOUNTER — Ambulatory Visit (INDEPENDENT_AMBULATORY_CARE_PROVIDER_SITE_OTHER): Payer: Medicaid Other | Admitting: Internal Medicine

## 2019-12-01 ENCOUNTER — Other Ambulatory Visit: Payer: Self-pay

## 2019-12-01 ENCOUNTER — Encounter: Payer: Self-pay | Admitting: Internal Medicine

## 2019-12-01 VITALS — BP 164/102 | HR 84 | Ht 63.0 in | Wt 258.4 lb

## 2019-12-01 DIAGNOSIS — I10 Essential (primary) hypertension: Secondary | ICD-10-CM | POA: Diagnosis not present

## 2019-12-01 DIAGNOSIS — R072 Precordial pain: Secondary | ICD-10-CM

## 2019-12-01 DIAGNOSIS — Z6841 Body Mass Index (BMI) 40.0 and over, adult: Secondary | ICD-10-CM

## 2019-12-01 DIAGNOSIS — R002 Palpitations: Secondary | ICD-10-CM

## 2019-12-01 DIAGNOSIS — E781 Pure hyperglyceridemia: Secondary | ICD-10-CM

## 2019-12-01 NOTE — Patient Instructions (Signed)
Medication Instructions:  None Ordered At This Time.  *If you need a refill on your cardiac medications before your next appointment, please call your pharmacy*   Lab Work: None Ordered At This Time.  If you have labs (blood work) drawn today and your tests are completely normal, you will receive your results only by: Marland Kitchen MyChart Message (if you have MyChart) OR . A paper copy in the mail If you have any lab test that is abnormal or we need to change your treatment, we will call you to review the results.   Testing/Procedures: None Ordered At This Time.   Follow-Up: At [facility], you and your health needs are our priority.  As part of our continuing mission to provide you with exceptional heart care, we have created designated Provider Care Teams.  These Care Teams include your primary Cardiologist (physician) and Advanced Practice Providers (APPs -  Physician Assistants and Nurse Practitioners) who all work together to provide you with the care you need, when you need it.  Your next appointment:   6 month(s)  The format for your next appointment:   In Person  Provider:   Cherlynn Kaiser, MD

## 2019-12-01 NOTE — Progress Notes (Signed)
Cardiology Office Note:    Date:  12/01/2019   ID:  Sheryl Cox, DOB October 19, 1970, MRN [id number]  PCP:  Lucianne Lei, MD  Cardiologist:  Elouise Munroe, MD  Electrophysiologist:  None   Referring MD: Lucianne Lei, MD   Chief Complaint/Reason for Referral: Palpitations,HTN  History of Present Illness:    Sheryl Cox is a 49 y.o. female with a history of asthma, breast cancer currently on tamoxifen, anemia, diabetes mellitus, migraine, hypertension.  She presents today for follow up of palpitations, HTN.  Blood pressure is elevated today and she has not been taking her medications for a few days.  No particular reason why, states medication fatigue.  She is interested in a program called herbal life.  I have cautioned her on the use of supplements in the setting of tamoxifen use as well as her antihypertensive medical therapy.  We discussed in detail dietary and lifestyle modifications that can contribute to weight loss and better blood pressure.  She has mild dependent edema and continues on HCTZ.  Edema resolves by the morning after elevation of her legs overnight.  Participates with PT with no CP or SOB.  The patient denies chest pain, chest pressure, dyspnea at rest or with exertion, palpitations, PND, orthopnea. Denies cough, fever, chills. Denies nausea, vomiting. Denies syncope or presyncope. Denies dizziness or lightheadedness.  Past Medical History:  Diagnosis Date  . Anemia    history of blood transfusion-no abnormal reaction noted  . Asthma    Albuterol as needed  . 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 Diabetes mellitus without complication ([place])    takes Metformin daily  . History of migraine    several yrs ago  . Hypertension    takes Tribenzor daily    Past Surgical History:  Procedure Laterality Date  . BIOPSY BREAST Left 12/05/12   fibroadenoma  . BREAST LUMPECTOMY     2014  . BREAST SURGERY Right   . CESAREAN  SECTION  01/05/2009  . CHOLECYSTECTOMY N/A 01/31/2016   Procedure: LAPAROSCOPIC CHOLECYSTECTOMY;  Surgeon: Erroll Luna, MD;  Location: [place];  Service: General;  Laterality: N/A;  . DILITATION & CURRETTAGE/HYSTROSCOPY WITH HYDROTHERMAL ABLATION N/A 08/12/2013   Procedure: DILATATION & CURETTAGE/HYSTEROSCOPY WITH HYDROTHERMAL ABLATION;  Surgeon: Frederico Hamman, MD;  Location: [facility];  Service: Gynecology;  Laterality: N/A;  . ROBOTIC ASSISTED TOTAL HYSTERECTOMY N/A 06/18/2017   Procedure: XI ROBOTIC ASSISTED TOTAL HYSTERECTOMY;  Surgeon: Lavonia Drafts, MD;  Location: [facility];  Service: Gynecology;  Laterality: N/A;  . SALPINGOOPHORECTOMY Bilateral 06/18/2017   Procedure: BILATERAL SALPINGO OOPHORECTOMY;  Surgeon: Lavonia Drafts, MD;  Location: [facility];  Service: Gynecology;  Laterality: Bilateral;    Current Medications: Current Meds  Medication Sig  . acetaminophen (TYLENOL) 325 MG tablet Take 650 mg by mouth daily as needed for headache.  . albuterol (VENTOLIN HFA) 108 (90 Base) MCG/ACT inhaler Inhale 1-2 puffs into the lungs every 6 (six) hours as needed for wheezing or shortness of breath.  . ALPRAZolam (XANAX) 0.25 MG tablet TAKE 1 TABLET BY MOUTH TWICE A DAY AS NEEDED FOR ANXIETY  . amLODipine (NORVASC) 10 MG tablet Take 10 mg by mouth daily.  . Ascorbic Acid (VITAMIN C PO) Take by mouth.  . celecoxib (CELEBREX) 200 MG capsule Take 1 capsule (200 mg total) by mouth 2 (two) times daily.  . cyclobenzaprine (FLEXERIL) 10 MG tablet Take 0.5-1 tablets (5-10 mg total) by mouth 2 (two) times daily  as needed for muscle spasms.  Marland Kitchen ELDERBERRY PO Take by mouth.  . fluticasone (FLONASE) 50 MCG/ACT nasal spray Place 2 sprays into both nostrils daily.  . hydrochlorothiazide (HYDRODIURIL) 12.5 MG tablet Take 2 tablets (25 mg total) by mouth daily. (Patient taking differently: Take 12.5 mg by mouth daily. )  . meloxicam (MOBIC) 15 MG tablet Take 1 tablet (15 mg total) by mouth daily.    . metFORMIN (GLUCOPHAGE) 500 MG tablet Take 1 tablet (500 mg total) by mouth 2 (two) times daily with a meal. (Patient taking differently: Take 500 mg by mouth daily. )  . Multiple Vitamins-Minerals (MULTIVITAMIN PO) Take 1 tablet by mouth daily.  . pantoprazole (PROTONIX) 20 MG tablet Take 1 tablet (20 mg total) by mouth daily.  . Semaglutide (RYBELSUS) 7 MG TABS Take by mouth.  . tamoxifen (NOLVADEX) 20 MG tablet Take 1 tablet (20 mg total) by mouth daily.  . traMADol (ULTRAM) 50 MG tablet SMARTSIG:1 Tablet(s) By Mouth Every 12 Hours PRN  . Vitamin D, Ergocalciferol, (DRISDOL) 1.25 MG (50000 UNIT) CAPS capsule Take 50,000 Units by mouth once a week.  . [DISCONTINUED] promethazine-dextromethorphan (PROMETHAZINE-DM) 6.25-15 MG/5ML syrup Take 5 mLs by mouth at bedtime as needed for cough.     Allergies:   Shrimp [shellfish allergy] and Aleve [naproxen]   Social History   Tobacco Use  . Smoking status: Never Smoker  . Smokeless tobacco: Never Used  Vaping Use  . Vaping Use: Never used  Substance Use Topics  . Alcohol use: Yes    Alcohol/week: 1.0 standard drink    Types: 1 Cans of beer per week    Comment: occa  . Drug use: No     Family History: The patient's family history includes Breast cancer in her paternal aunt, paternal grandmother, and another family member; COPD in her mother; Diabetes in her mother; Hypertension in her mother; Prostate cancer in her paternal grandfather and paternal uncle; Prostate cancer (age of onset: 58) in her father. There is no history of Anesthesia problems, Amblyopia, Blindness, Cataracts, Glaucoma, Macular degeneration, Retinal detachment, Strabismus, or Retinitis pigmentosa.  ROS:   Please see the history of present illness.    All other systems reviewed and are negative.  EKGs/Labs/Other Studies Reviewed:    The following studies were reviewed today:  EKG:  SR, sinus arrhythmia  Recent Labs: 08/04/2019: ALT 16; BUN 19; Creatinine, Ser  0.85; Hemoglobin 11.5; Platelets 349; Potassium 4.0; Sodium 139  Recent Lipid Panel    Component Value Date/Time   CHOL 161 05/08/2018 1216   TRIG 207 (H) 05/08/2018 1216   HDL 30 (L) 05/08/2018 1216   CHOLHDL 5.4 (H) 05/08/2018 1216   LDLCALC 90 05/08/2018 1216    Physical Exam:    VS:  BP (!) 164/102   Pulse 84   Ht 5\' 3"  (1.6 m)   Wt 258 lb 6.4 oz (117.2 kg)   LMP 05/27/2017 (Approximate)   SpO2 99%   BMI 45.77 kg/m     Wt Readings from Last 5 Encounters:  12/01/19 258 lb 6.4 oz (117.2 kg)  11/02/19 251 lb 6.4 oz (114 kg)  10/20/19 250 lb (113.4 kg)  09/21/19 245 lb (111.1 kg)  09/11/19 252 lb 14.4 oz (114.7 kg)    Constitutional: No acute distress Eyes: sclera non-icteric, normal conjunctiva and lids ENMT: normal dentition, moist mucous membranes Cardiovascular: regular rhythm, normal rate, no murmurs. S1 and S2 normal. Radial pulses normal bilaterally. No jugular venous distention.  Respiratory: clear to  auscultation bilaterally GI : normal bowel sounds, soft and nontender. No distention.   MSK: extremities warm, well perfused. No edema.  NEURO: grossly nonfocal exam, moves all extremities. PSYCH: alert and oriented x 3, normal mood and affect.   ASSESSMENT:    1. Palpitations   2. Precordial pain   3. Class 3 severe obesity with body mass index (BMI) of 45.0 to 49.9 in adult, unspecified obesity type, unspecified whether serious comorbidity present ([place])   4. Essential hypertension   5. Pure hypertriglyceridemia    PLAN:    Palpitations - Plan: EKG 12-Lead Precordial pain -Symptoms have resolved, unremarkable testing with echocardiogram, Lexiscan Myoview and cardiac monitor.  Continue to observe.  Class 3 severe obesity with body mass index (BMI) of 45.0 to 49.9 in adult, unspecified obesity type, unspecified whether serious comorbidity present ([place]) -She has been started on semaglutide for weight loss.  We have discussed exercise recommendations.  I have  cautioned her on the use of supplements which may contain stimulant medications which will exacerbate her palpitations and hypertension.  Essential hypertension-blood pressure elevated today but patient has been not taking her medications.  Continue amlodipine 10 mg daily, hydrochlorothiazide 25 mg daily.  Will titrate therapy at next visit if compliant and still elevated.  Pure hypertriglyceridemia-patient is not taking atorvastatin 10 mg daily despite suboptimal lipid control.  Would continue.  Total time of encounter: 30 minutes total time of encounter, including 20 minutes spent in face-to-face patient care on the date of this encounter. This time includes coordination of care and counseling regarding above mentioned problem list. Remainder of non-face-to-face time involved reviewing chart documents/testing relevant to the patient encounter and documentation in the medical record. I have independently reviewed documentation from referring provider.   Cherlynn Kaiser, MD [place]  [facility]    Medication Adjustments/Labs and Tests Ordered: Current medicines are reviewed at length with the patient today.  Concerns regarding medicines are outlined above.  Orders Placed This Encounter  Procedures  . EKG 12-Lead   No orders of the defined types were placed in this encounter.   Patient Instructions  Medication Instructions:  None Ordered At This Time.  *If you need a refill on your cardiac medications before your next appointment, please call your pharmacy*   Lab Work: None Ordered At This Time.  If you have labs (blood work) drawn today and your tests are completely normal, you will receive your results only by: Marland Kitchen MyChart Message (if you have MyChart) OR . A paper copy in the mail If you have any lab test that is abnormal or we need to change your treatment, we will call you to review the results.   Testing/Procedures: None Ordered At This Time.   Follow-Up: At [facility], you and your health needs are our priority.  As part of our continuing mission to provide you with exceptional heart care, we have created designated Provider Care Teams.  These Care Teams include your primary Cardiologist (physician) and Advanced Practice Providers (APPs -  Physician Assistants and Nurse Practitioners) who all work together to provide you with the care you need, when you need it.  Your next appointment:   6 month(s)  The format for your next appointment:   In Person  Provider:   Cherlynn Kaiser, MD

## 2019-12-02 ENCOUNTER — Ambulatory Visit: Payer: Medicaid Other | Admitting: Physical Therapy

## 2019-12-02 ENCOUNTER — Ambulatory Visit: Payer: Medicaid Other | Admitting: Registered"

## 2019-12-03 ENCOUNTER — Other Ambulatory Visit: Payer: Self-pay

## 2019-12-03 ENCOUNTER — Encounter (HOSPITAL_COMMUNITY): Payer: Self-pay

## 2019-12-03 ENCOUNTER — Ambulatory Visit (HOSPITAL_COMMUNITY)
Admission: EM | Admit: 2019-12-03 | Discharge: 2019-12-03 | Disposition: A | Discharge status: Home / Self Care | Payer: Medicaid Other | Attending: Internal Medicine | Admitting: Internal Medicine

## 2019-12-03 DIAGNOSIS — J069 Acute upper respiratory infection, unspecified: Secondary | ICD-10-CM | POA: Diagnosis not present

## 2019-12-03 DIAGNOSIS — Z6841 Body Mass Index (BMI) 40.0 and over, adult: Secondary | ICD-10-CM | POA: Diagnosis not present

## 2019-12-03 DIAGNOSIS — R0981 Nasal congestion: Secondary | ICD-10-CM

## 2019-12-03 DIAGNOSIS — I10 Essential (primary) hypertension: Secondary | ICD-10-CM | POA: Insufficient documentation

## 2019-12-03 DIAGNOSIS — G8929 Other chronic pain: Secondary | ICD-10-CM | POA: Diagnosis not present

## 2019-12-03 DIAGNOSIS — J029 Acute pharyngitis, unspecified: Secondary | ICD-10-CM | POA: Diagnosis present

## 2019-12-03 DIAGNOSIS — E119 Type 2 diabetes mellitus without complications: Secondary | ICD-10-CM | POA: Insufficient documentation

## 2019-12-03 DIAGNOSIS — Z170 Estrogen receptor positive status [ER+]: Secondary | ICD-10-CM | POA: Diagnosis not present

## 2019-12-03 DIAGNOSIS — Z20822 Contact with and (suspected) exposure to covid-19: Secondary | ICD-10-CM | POA: Diagnosis not present

## 2019-12-03 DIAGNOSIS — Z791 Long term (current) use of non-steroidal anti-inflammatories (NSAID): Secondary | ICD-10-CM | POA: Diagnosis not present

## 2019-12-03 DIAGNOSIS — Z7984 Long term (current) use of oral hypoglycemic drugs: Secondary | ICD-10-CM | POA: Diagnosis not present

## 2019-12-03 DIAGNOSIS — Z79899 Other long term (current) drug therapy: Secondary | ICD-10-CM | POA: Diagnosis not present

## 2019-12-03 DIAGNOSIS — Z90722 Acquired absence of ovaries, bilateral: Secondary | ICD-10-CM | POA: Insufficient documentation

## 2019-12-03 DIAGNOSIS — Z853 Personal history of malignant neoplasm of breast: Secondary | ICD-10-CM | POA: Insufficient documentation

## 2019-12-03 DIAGNOSIS — Z886 Allergy status to analgesic agent status: Secondary | ICD-10-CM | POA: Insufficient documentation

## 2019-12-03 LAB — POCT RAPID STREP A, ED / UC: Streptococcus, Group A Screen (Direct): NEGATIVE

## 2019-12-03 LAB — SARS CORONAVIRUS 2 (TAT 6-24 HRS): SARS Coronavirus 2: NEGATIVE

## 2019-12-03 NOTE — ED Triage Notes (Signed)
Pt presents with non productive cough, sore throat, and congestion since yesterday.

## 2019-12-03 NOTE — Discharge Instructions (Addendum)
Return if any problems.

## 2019-12-03 NOTE — ED Provider Notes (Signed)
[place]    CSN: [id number] Arrival date & time: 12/03/19  0830      History   Chief Complaint Chief Complaint  Patient presents with  . Sore Throat  . Congestion    HPI Sheryl Cox is a 49 y.o. female.   The history is provided by the patient. No language interpreter was used.  Sore Throat This is a new problem. The current episode started 2 days ago. The problem occurs constantly. The problem has not changed since onset.Nothing aggravates the symptoms. Nothing relieves the symptoms. She has tried nothing for the symptoms. The treatment provided no relief.   Pt complains of a cough and a sore throat.  Pt has had immunizations  Past Medical History:  Diagnosis Date  . Anemia    history of blood transfusion-no abnormal reaction noted  . Asthma    Albuterol as needed  . 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 Diabetes mellitus without complication ([place])    takes Metformin daily  . History of migraine    several yrs ago  . Hypertension    takes Tribenzor daily    Patient Active Problem List   Diagnosis Date Noted  . Chronic pain of both knees 05/21/2018  . Chronic right shoulder pain 05/21/2018  . BMI 40.0-44.9, adult ([place]) 05/21/2018  . Anemia 06/05/2013  . 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 Nabothian cyst 12/07/2011  . Cervical mass 11/20/2011  . Fibroid uterus 11/20/2011    Past Surgical History:  Procedure Laterality Date  . BIOPSY BREAST Left 12/05/12   fibroadenoma  . BREAST LUMPECTOMY     2014  . BREAST SURGERY Right   . CESAREAN SECTION  01/05/2009  . CHOLECYSTECTOMY N/A 01/31/2016   Procedure: LAPAROSCOPIC CHOLECYSTECTOMY;  Surgeon: Erroll Luna, MD;  Location: [facility];  Service: General;  Laterality: N/A;  . DILITATION & CURRETTAGE/HYSTROSCOPY WITH HYDROTHERMAL ABLATION N/A 08/12/2013   Procedure: DILATATION & CURETTAGE/HYSTEROSCOPY WITH HYDROTHERMAL ABLATION;  Surgeon:  Frederico Hamman, MD;  Location: [facility];  Service: Gynecology;  Laterality: N/A;  . ROBOTIC ASSISTED TOTAL HYSTERECTOMY N/A 06/18/2017   Procedure: XI ROBOTIC ASSISTED TOTAL HYSTERECTOMY;  Surgeon: Lavonia Drafts, MD;  Location: [facility];  Service: Gynecology;  Laterality: N/A;  . SALPINGOOPHORECTOMY Bilateral 06/18/2017   Procedure: BILATERAL SALPINGO OOPHORECTOMY;  Surgeon: Lavonia Drafts, MD;  Location: [facility];  Service: Gynecology;  Laterality: Bilateral;    OB History    Gravida  1   Para  1   Term  1   Preterm  0   AB  0   Living  1     SAB  0   TAB  0   Ectopic  0   Multiple  0   Live Births  1            Home Medications    Prior to Admission medications   Medication Sig Start Date End Date Taking? Authorizing Provider  acetaminophen (TYLENOL) 325 MG tablet Take 650 mg by mouth daily as needed for headache.    [provider]  albuterol (VENTOLIN HFA) 108 (90 Base) MCG/ACT inhaler Inhale 1-2 puffs into the lungs every 6 (six) hours as needed for wheezing or shortness of breath. 09/21/19   Wieters, Hallie C, PA-C  ALPRAZolam (XANAX) 0.25 MG tablet TAKE 1 TABLET BY MOUTH TWICE A DAY AS NEEDED FOR ANXIETY 08/25/18   Lucianne Lei, MD  amLODipine (NORVASC) 10  MG tablet Take 10 mg by mouth daily. 02/23/17   [provider]  Ascorbic Acid (VITAMIN C PO) Take by mouth.    [provider]  celecoxib (CELEBREX) 200 MG capsule Take 1 capsule (200 mg total) by mouth 2 (two) times daily. 10/20/19   Margarita Mail, PA-C  cyclobenzaprine (FLEXERIL) 10 MG tablet Take 0.5-1 tablets (5-10 mg total) by mouth 2 (two) times daily as needed for muscle spasms. 10/20/19   Harris, Abigail, PA-C  ELDERBERRY PO Take by mouth.    [provider]  fluticasone (FLONASE) 50 MCG/ACT nasal spray Place 2 sprays into both nostrils daily. 05/12/18   Tasia Catchings, Amy V, PA-C  hydrochlorothiazide (HYDRODIURIL) 12.5 MG tablet Take 2 tablets (25 mg total) by mouth  daily. Patient taking differently: Take 12.5 mg by mouth daily.  05/14/18   Scot Jun, FNP  meloxicam (MOBIC) 15 MG tablet Take 1 tablet (15 mg total) by mouth daily. 05/14/18   Scot Jun, FNP  metFORMIN (GLUCOPHAGE) 500 MG tablet Take 1 tablet (500 mg total) by mouth 2 (two) times daily with a meal. Patient taking differently: Take 500 mg by mouth daily.  06/19/17   Lavonia Drafts, MD  Multiple Vitamins-Minerals (MULTIVITAMIN PO) Take 1 tablet by mouth daily.    [provider]  pantoprazole (PROTONIX) 20 MG tablet Take 1 tablet (20 mg total) by mouth daily. 09/11/19   Nicholas Lose, MD  Semaglutide (RYBELSUS) 7 MG TABS Take by mouth.    [provider]  tamoxifen (NOLVADEX) 20 MG tablet Take 1 tablet (20 mg total) by mouth daily. 09/11/19   Nicholas Lose, MD  traMADol (ULTRAM) 50 MG tablet SMARTSIG:1 Tablet(s) By Mouth Every 12 Hours PRN 10/26/19   [provider]  Vitamin D, Ergocalciferol, (DRISDOL) 1.25 MG (50000 UNIT) CAPS capsule Take 50,000 Units by mouth once a week. 10/26/19   [provider]    Family History Family History  Problem Relation Age of Onset  . Hypertension Mother   . Diabetes Mother   . COPD Mother   . Breast cancer Paternal Grandmother        dx in her 36s  . Prostate cancer Paternal Grandfather   . Prostate cancer Father 87  . Prostate cancer Paternal Uncle   . Breast cancer Other        paternal grandmother's sister  . Breast cancer Paternal Aunt   . Anesthesia problems Neg Hx   . Amblyopia Neg Hx   . Blindness Neg Hx   . Cataracts Neg Hx   . Glaucoma Neg Hx   . Macular degeneration Neg Hx   . Retinal detachment Neg Hx   . Strabismus Neg Hx   . Retinitis pigmentosa Neg Hx     Social History Social History   Tobacco Use  . Smoking status: Never Smoker  . Smokeless tobacco: Never Used  Vaping Use  . Vaping Use: Never used  Substance Use Topics  . Alcohol use: Yes    Alcohol/week: 1.0  standard drink    Types: 1 Cans of beer per week    Comment: occa  . Drug use: No     Allergies   Shrimp [shellfish allergy] and Aleve [naproxen]   Review of Systems Review of Systems  All other systems reviewed and are negative.    Physical Exam Triage Vital Signs ED Triage Vitals  Enc Vitals Group     BP 12/03/19 0939 (!) 134/93     Pulse Rate 12/03/19  0939 92     Resp 12/03/19 0939 17     Temp 12/03/19 0939 98.9 F (37.2 C)     Temp Source 12/03/19 0939 Oral     SpO2 12/03/19 0939 98 %     Weight --      Height --      Head Circumference --      Peak Flow --      Pain Score 12/03/19 0937 5     Pain Loc --      Pain Edu? --      Excl. in [place]? --    No data found.  Updated Vital Signs BP (!) 134/93 (BP Location: Right Arm)   Pulse 92   Temp 98.9 F (37.2 C) (Oral)   Resp 17   LMP 05/27/2017 (Approximate)   SpO2 98%   Visual Acuity Right Eye Distance:   Left Eye Distance:   Bilateral Distance:    Right Eye Near:   Left Eye Near:    Bilateral Near:     Physical Exam Vitals and nursing note reviewed.  Constitutional:      Appearance: She is well-developed.  HENT:     Head: Normocephalic.     Mouth/Throat:     Pharynx: Posterior oropharyngeal erythema present.     Tonsils: No tonsillar exudate.  Pulmonary:     Effort: Pulmonary effort is normal.  Abdominal:     General: There is no distension.  Musculoskeletal:        General: Normal range of motion.     Cervical back: Normal range of motion.  Skin:    General: Skin is warm.  Neurological:     General: No focal deficit present.     Mental Status: She is alert and oriented to person, place, and time.      UC Treatments / Results  Labs (all labs ordered are listed, but only abnormal results are displayed) Labs Reviewed  SARS CORONAVIRUS 2 (TAT 6-24 HRS)  POCT RAPID STREP A, ED / UC    EKG   Radiology No results found.  Procedures Procedures (including critical care  time)  Medications Ordered in UC Medications - No data to display  Initial Impression / Assessment and Plan / UC Course  I have reviewed the triage vital signs and the nursing notes.  Pertinent labs & imaging results that were available during my care of the patient were reviewed by me and considered in my medical decision making (see chart for details).     MDM: strep is negative, covid pending.   Final Clinical Impressions(s) / UC Diagnoses   Final diagnoses:  Upper respiratory tract infection, unspecified type     Discharge Instructions     Return if any problems.     ED Prescriptions    None     PDMP not reviewed this encounter.  An After Visit Summary was printed and given to the patient.    Fransico Meadow, [place] 12/03/19 1148

## 2019-12-04 ENCOUNTER — Ambulatory Visit: Payer: Medicaid Other | Admitting: Physical Therapy

## 2019-12-05 LAB — CULTURE, GROUP A STREP (THRC)

## 2019-12-08 ENCOUNTER — Ambulatory Visit: Payer: Medicaid Other | Admitting: Physical Therapy

## 2019-12-09 ENCOUNTER — Telehealth: Payer: Self-pay | Admitting: Physical Therapy

## 2019-12-09 NOTE — Telephone Encounter (Signed)
Attempted to contact patient in regards to missed PT appointment on 12/08/2019. No answer and unable to leave message.   Hilda Blades, PT, DPT, LAT, ATC 12/09/19  1:44 PM Phone: [phone] Fax: [phone]

## 2019-12-10 ENCOUNTER — Ambulatory Visit: Payer: Medicaid Other | Admitting: Physical Therapy

## 2019-12-15 ENCOUNTER — Other Ambulatory Visit: Payer: Self-pay

## 2019-12-15 ENCOUNTER — Ambulatory Visit: Payer: Medicaid Other | Admitting: Physical Therapy

## 2019-12-15 ENCOUNTER — Encounter: Payer: Self-pay | Admitting: Physical Therapy

## 2019-12-15 DIAGNOSIS — M545 Low back pain, unspecified: Secondary | ICD-10-CM

## 2019-12-15 DIAGNOSIS — M62830 Muscle spasm of back: Secondary | ICD-10-CM

## 2019-12-15 DIAGNOSIS — M6281 Muscle weakness (generalized): Secondary | ICD-10-CM

## 2019-12-15 DIAGNOSIS — R262 Difficulty in walking, not elsewhere classified: Secondary | ICD-10-CM

## 2019-12-15 NOTE — Therapy (Addendum)
[place], [place], [address] Phone: [phone]   Fax:  [phone]  Physical Therapy Treatment / Discharge  Patient Details  Name: Sheryl Cox MRN: [id number] Date of Birth: 01/28/1971 Referring Provider (PT): Jordan Hawks, NP   Encounter Date: 12/15/2019   PT End of Session - 12/15/19 0918    Visit Number 10    Number of Visits 13    Date for PT Re-Evaluation 12/18/19    Authorization Type [employer] Managed Medicaid    Authorization - Visit Number 9    Authorization - Number of Visits 27    PT Start Time 0913    PT Stop Time 0954    PT Time Calculation (min) 41 min    Activity Tolerance Patient tolerated treatment well    Behavior During Therapy [facility] for tasks assessed/performed           Past Medical History:  Diagnosis Date  . Anemia    history of blood transfusion-no abnormal reaction noted  . Asthma    Albuterol as needed  . 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 Diabetes mellitus without complication ([place])    takes Metformin daily  . History of migraine    several yrs ago  . Hypertension    takes Tribenzor daily    Past Surgical History:  Procedure Laterality Date  . BIOPSY BREAST Left 12/05/12   fibroadenoma  . BREAST LUMPECTOMY     2014  . BREAST SURGERY Right   . CESAREAN SECTION  01/05/2009  . CHOLECYSTECTOMY N/A 01/31/2016   Procedure: LAPAROSCOPIC CHOLECYSTECTOMY;  Surgeon: Erroll Luna, MD;  Location: [facility];  Service: General;  Laterality: N/A;  . DILITATION & CURRETTAGE/HYSTROSCOPY WITH HYDROTHERMAL ABLATION N/A 08/12/2013   Procedure: DILATATION & CURETTAGE/HYSTEROSCOPY WITH HYDROTHERMAL ABLATION;  Surgeon: Frederico Hamman, MD;  Location: [facility];  Service: Gynecology;  Laterality: N/A;  . ROBOTIC ASSISTED TOTAL HYSTERECTOMY N/A 06/18/2017   Procedure: XI ROBOTIC ASSISTED TOTAL HYSTERECTOMY;  Surgeon: Lavonia Drafts, MD;   Location: [facility];  Service: Gynecology;  Laterality: N/A;  . SALPINGOOPHORECTOMY Bilateral 06/18/2017   Procedure: BILATERAL SALPINGO OOPHORECTOMY;  Surgeon: Lavonia Drafts, MD;  Location: [facility];  Service: Gynecology;  Laterality: Bilateral;    There were no vitals filed for this visit.   Subjective Assessment - 12/15/19 0916    Subjective Patient reports she got sick and that is why she missed her past few appointments. She is feeling better now. Low back is doing alright.    Patient Stated Goals The pain be gone in my back and move like I was before.    Currently in Pain? Yes    Pain Score 6     Pain Location Back    Pain Orientation Lower    Pain Descriptors / Indicators Sore    Pain Type Acute pain    Pain Onset More than a month ago    Pain Frequency Intermittent              [facility] PT Assessment - 12/15/19 0001      Assessment   Medical Diagnosis Low back pain    Referring Provider (PT) Jordan Hawks, NP    Onset Date/Surgical Date 10/11/19                         [facility] Adult PT Treatment/Exercise - 12/15/19 0001      Lumbar Exercises: Stretches  Lower Trunk Rotation 5 reps;10 seconds   2 sets   Other Lumbar Stretch Exercise Seated lumbar flexion stretch with physioball x 5 fwd, x 5 lateral each      Lumbar Exercises: Aerobic   UBE (Upper Arm Bike) L5 x 5 min (LE only)      Lumbar Exercises: Standing   Other Standing Lumbar Exercises Hip extension 2 x 10 - focus on core engagement to avoid lumbar extension      Lumbar Exercises: Seated   Sit to Stand 10 reps   2 sets   Sit to Stand Limitations focus on hip hinge      Lumbar Exercises: Supine   Clam 10 reps   2 sets   Clam Limitations green band    Bridge 10 reps;3 seconds   2 sets   Straight Leg Raise 10 reps   2 sets   Other Supine Lumbar Exercises 90-90 alteranting heel taps 5 x 5                  PT Education - 12/15/19 0918    Education Details HEP    Person(s)  Educated Patient    Methods Explanation    Comprehension Verbalized understanding;Need further instruction            PT Short Term Goals - 11/24/19 0955      PT SHORT TERM GOAL #1   Title Ptwill be Ind in an inital HEP    Baseline strated on IE    Time 3    Period Weeks    Status On-going    Target Date 11/20/19      PT SHORT TERM GOAL #2   Title Pt will voice understanding of measures to reuce and manage pain    Time 3    Period Weeks    Status Achieved    Target Date 11/20/19             PT Long Term Goals - 10/30/19 0736      PT LONG TERM GOAL #1   Title Pt will be Ind in a final HEP    Time 7    Period Weeks    Status New    Target Date 12/18/19      PT LONG TERM GOAL #2   Title Improve bilat hamstring ROM to 50d    Baseline L 33d; R 35d    Time 7    Period Weeks    Status New    Target Date 12/18/19      PT LONG TERM GOAL #3   Title Improve trunk forward and side bending to 4 inches past knees, and to knee respectively    Baseline Se flowsheet    Time 7    Period Weeks    Status New    Target Date 12/18/19      PT LONG TERM GOAL #4   Title Pt will report improved LBP not exceeding 3/10 with daily activities    Baseline 6/10    Time 7    Period Weeks    Status New    Target Date 12/18/19      PT LONG TERM GOAL #5   Title Pt will demonstrate proper body mechanics for daily activities and lifting for improved back care    Time 7    Period Weeks    Status New    Target Date 12/18/19  Plan - 12/15/19 0920    Clinical Impression Statement Patient tolerated therapy well with no adverse effects. Continued therapy focus on core/hip strengthening with good tolerance. Patient noted new left shoulder injury so did not perform any exercises that required her to use or put weight through her shoulders. She did not report any increase in lower back pain with exercises this visit, but does continue to exhibit gross core weakness  with poor lumbopelvic control. Patient notes that she is going to drop off paperwork at the [facility] to access the pool so may consider aquatic therapy in future. She would benefit from continued skilled PT to progress mobility and strength to reduce pain and maximize functional ability.    PT Treatment/Interventions ADLs/Self Care Home Management;Cryotherapy;Electrical Stimulation;Ultrasound;Traction;Moist Heat;Iontophoresis 60m/ml Dexamethasone;Functional mobility training;Therapeutic activities;Therapeutic exercise;Manual techniques;Patient/family education;Passive range of motion;Dry needling;Taping    PT Next Visit Plan Continue to address low back and LE flexibilty and core strength.    PT Home Exercise Plan [email or web address]: SLR, bridge, side clamshell, LTR, hip flexor stretch edge of bed, seated hasmtring stretch, seated lumbar flexion stretch, hip ext in standing c abd engagement    Consulted and Agree with Plan of Care Patient           Patient will benefit from skilled therapeutic intervention in order to improve the following deficits and impairments:  Difficulty walking, Decreased range of motion, Increased muscle spasms, Obesity, Decreased activity tolerance, Pain, Impaired flexibility, Decreased strength, Postural dysfunction  Visit Diagnosis: Acute midline low back pain without sciatica  Muscle weakness (generalized)  Difficulty in walking, not elsewhere classified  Muscle spasm of back     Problem List Patient Active Problem List   Diagnosis Date Noted  . Chronic pain of both knees 05/21/2018  . Chronic right shoulder pain 05/21/2018  . BMI 40.0-44.9, adult ([place] 05/21/2018  . Anemia 06/05/2013  . 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 Nabothian cyst 12/07/2011  . Cervical mass 11/20/2011  . Fibroid uterus 11/20/2011    CHilda Blades PT, DPT, LAT, ATC 12/15/19  9:55 AM Phone: [phone]: 3[facility] [place] [address]Phone: [phone]  Fax:  [phone] Name: Sheryl Cox: [id number] of Birth: 102-Jul-1972   PHYSICAL THERAPY DISCHARGE SUMMARY  Visits from Start of Care: 10  Current functional level related to goals / functional outcomes: See above   Remaining deficits: See above   Education / Equipment: HEP  Plan: Patient agrees to discharge.  Patient goals were not met. Patient is being discharged due to not returning since the last visit.  ?????     CHilda Blades PT, DPT, LAT, ATC 01/08/20  9:20 AM Phone: [phone]: [phone]

## 2019-12-15 NOTE — Patient Instructions (Signed)
Access Code: [email or web address] URL: [email or web address] Date: 12/15/2019 Prepared by: Hilda Blades  Exercises Supine Active Straight Leg Raise - 3-4 x weekly - 2 sets - 10 reps Bridge - 3-4 x weekly - 2 sets - 10 reps Clamshell - 1 x daily - 3-4 x weekly - 3 sets - 10 reps Supine Lower Trunk Rotation - 3-4 x weekly - 5 reps - 10 seconds hold Modified Thomas Stretch - 3-4 x weekly - 2 reps - 30-45 seconds hold Seated Hamstring Stretch - 3-4 x weekly - 2 reps - 30-45 hold Seated Lumbar Flexion Stretch - 3-4 x weekly - 5-10 reps - 10 seconds hold Scapular Retraction with Resistance - 1 x daily - 7 x weekly - 2 sets - 15 reps - 3 hold Shoulder extension with resistance - Neutral - 1 x daily - 7 x weekly - 2 sets - 15 reps - 3 hold Standing Hip Extension with Counter Support - 1 x daily - 7 x weekly - 2 sets - 15 reps

## 2019-12-18 ENCOUNTER — Ambulatory Visit: Payer: Medicaid Other | Admitting: Physical Therapy

## 2019-12-24 ENCOUNTER — Ambulatory Visit: Payer: Medicaid Other | Attending: Nurse Practitioner | Admitting: Physical Therapy

## 2019-12-24 ENCOUNTER — Telehealth: Payer: Self-pay | Admitting: Physical Therapy

## 2019-12-24 NOTE — Telephone Encounter (Signed)
Patient contacted due to missed PT appointment. She stated she forgot about the appointment, she was reminded of next scheduled appointment on 01/01/2020 and regarding the attendance policy. Patient expressed understanding.  Hilda Blades, PT, DPT, LAT, ATC 12/24/19  12:05 PM Phone: [phone] Fax: [phone]

## 2019-12-28 ENCOUNTER — Other Ambulatory Visit: Payer: Self-pay | Admitting: Hematology and Oncology

## 2020-01-01 ENCOUNTER — Ambulatory Visit: Payer: Medicaid Other

## 2020-01-01 ENCOUNTER — Telehealth: Payer: Self-pay

## 2020-01-01 NOTE — Telephone Encounter (Signed)
LVM re: no show appt, attendance policy and upcoming appt. On 10/22. Advised to call an cancel if not able to make the appt.

## 2020-01-08 ENCOUNTER — Telehealth: Payer: Self-pay | Admitting: Physical Therapy

## 2020-01-08 ENCOUNTER — Ambulatory Visit: Payer: Medicaid Other | Admitting: Physical Therapy

## 2020-01-08 NOTE — Telephone Encounter (Signed)
Patient contacted in regard to missing PT appointment. She was informed on missed appointment and due to 3rd consecutive no-show she would be discharged from PT. Patient instructed to follow-up with her provider and get a new PT referral in order to return to therapy. Patient expressed understanding.  Hilda Blades, PT, DPT, LAT, ATC 01/08/20  9:16 AM Phone: [phone] Fax: [phone]

## 2020-01-11 ENCOUNTER — Encounter: Payer: Medicaid Other | Attending: Family Medicine | Admitting: Registered"

## 2020-01-11 DIAGNOSIS — E669 Obesity, unspecified: Secondary | ICD-10-CM | POA: Insufficient documentation

## 2020-01-11 DIAGNOSIS — I10 Essential (primary) hypertension: Secondary | ICD-10-CM | POA: Insufficient documentation

## 2020-01-11 DIAGNOSIS — R7303 Prediabetes: Secondary | ICD-10-CM | POA: Insufficient documentation

## 2020-01-20 ENCOUNTER — Ambulatory Visit (HOSPITAL_COMMUNITY)
Admission: EM | Admit: 2020-01-20 | Discharge: 2020-01-20 | Disposition: A | Discharge status: Home / Self Care | Payer: Medicaid Other | Attending: Family Medicine | Admitting: Family Medicine

## 2020-01-20 ENCOUNTER — Other Ambulatory Visit: Payer: Self-pay

## 2020-01-20 ENCOUNTER — Encounter (HOSPITAL_COMMUNITY): Payer: Self-pay

## 2020-01-20 DIAGNOSIS — M79642 Pain in left hand: Secondary | ICD-10-CM

## 2020-01-20 NOTE — ED Provider Notes (Signed)
[place]    CSN: [id number] Arrival date & time: 01/20/20  1325      History   Chief Complaint Chief Complaint  Patient presents with   Hand Pain    HPI Sheryl Cox is a 49 y.o. female.   Sheryl Cox presents with complaints of left hand pain which she noted earlier today. While driving she noted pain to left palm, and visualized bruising and mild swelling. No injury to the area. States she has had similar in the past which resolved without treatment. States she has a history of arthritis, typically to her right shoulder. Has been given meloxicam and celebrex in the past but doesn't take these regularly. No numbness or tingling. Hasn't taken any medications for pain. She is right handed. 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 Full ROM to hand.    ROS per HPI, negative if not otherwise mentioned.      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   Nabothian cyst 12/07/2011   Cervical mass 11/20/2011   Fibroid uterus 11/20/2011    Past Surgical History:  Procedure Laterality Date   BIOPSY BREAST Left 12/05/12   fibroadenoma   BREAST LUMPECTOMY     2014   BREAST SURGERY Right    CESAREAN SECTION  01/05/2009   CHOLECYSTECTOMY N/A 01/31/2016   Procedure: LAPAROSCOPIC CHOLECYSTECTOMY;  Surgeon: Erroll Luna, MD;  Location: [facility];  Service: General;  Laterality: N/A;    DILITATION & CURRETTAGE/HYSTROSCOPY WITH HYDROTHERMAL ABLATION N/A 08/12/2013   Procedure: DILATATION & CURETTAGE/HYSTEROSCOPY WITH HYDROTHERMAL ABLATION;  Surgeon: Frederico Hamman, MD;  Location: [facility];  Service: Gynecology;  Laterality: N/A;   ROBOTIC ASSISTED TOTAL HYSTERECTOMY N/A 06/18/2017   Procedure: XI ROBOTIC ASSISTED TOTAL HYSTERECTOMY;  Surgeon: Lavonia Drafts, MD;  Location: [facility];  Service: Gynecology;  Laterality: N/A;   SALPINGOOPHORECTOMY Bilateral 06/18/2017   Procedure: BILATERAL SALPINGO OOPHORECTOMY;  Surgeon: Lavonia Drafts, MD;  Location: [facility];  Service: Gynecology;  Laterality: Bilateral;    OB History    Gravida  1   Para  1   Term  1   Preterm  0   AB  0   Living  1     SAB  0   TAB  0   Ectopic  0   Multiple  0   Live Births  1            Home Medications    Prior to Admission medications   Medication Sig Start Date End Date Taking? Authorizing Provider  acetaminophen (TYLENOL) 325 MG tablet Take 650 mg by mouth daily as needed for headache.    [provider]  albuterol (VENTOLIN HFA) 108 (90 Base) MCG/ACT inhaler Inhale 1-2 puffs into the lungs every 6 (six) hours as needed for wheezing or shortness of breath. 09/21/19  Wieters, Hallie C, PA-C  ALPRAZolam (XANAX) 0.25 MG tablet TAKE 1 TABLET BY MOUTH TWICE A DAY AS NEEDED FOR ANXIETY 08/25/18   Lucianne Lei, MD  amLODipine (NORVASC) 10 MG tablet Take 10 mg by mouth daily. 02/23/17   [provider]  Ascorbic Acid (VITAMIN C PO) Take by mouth.    [provider]  celecoxib (CELEBREX) 200 MG capsule Take 1 capsule (200 mg total) by mouth 2 (two) times daily. 10/20/19   Margarita Mail, PA-C  cyclobenzaprine (FLEXERIL) 10 MG tablet Take 0.5-1 tablets (5-10 mg total) by mouth 2 (two) times daily as needed for muscle spasms. 10/20/19   Harris, Abigail, PA-C  ELDERBERRY PO Take by mouth.    [provider]  fluticasone (FLONASE) 50 MCG/ACT nasal  spray Place 2 sprays into both nostrils daily. 05/12/18   Tasia Catchings, Amy V, PA-C  hydrochlorothiazide (HYDRODIURIL) 12.5 MG tablet Take 2 tablets (25 mg total) by mouth daily. Patient taking differently: Take 12.5 mg by mouth daily.  05/14/18   Scot Jun, FNP  meloxicam (MOBIC) 15 MG tablet Take 1 tablet (15 mg total) by mouth daily. 05/14/18   Scot Jun, FNP  metFORMIN (GLUCOPHAGE) 500 MG tablet Take 1 tablet (500 mg total) by mouth 2 (two) times daily with a meal. Patient taking differently: Take 500 mg by mouth daily.  06/19/17   Lavonia Drafts, MD  Multiple Vitamins-Minerals (MULTIVITAMIN PO) Take 1 tablet by mouth daily.    [provider]  pantoprazole (PROTONIX) 20 MG tablet TAKE 1 TABLET BY MOUTH EVERY DAY 12/29/19   Nicholas Lose, MD  Semaglutide (RYBELSUS) 7 MG TABS Take by mouth.    [provider]  tamoxifen (NOLVADEX) 20 MG tablet Take 1 tablet (20 mg total) by mouth daily. 09/11/19   Nicholas Lose, MD  traMADol (ULTRAM) 50 MG tablet SMARTSIG:1 Tablet(s) By Mouth Every 12 Hours PRN 10/26/19   [provider]  Vitamin D, Ergocalciferol, (DRISDOL) 1.25 MG (50000 UNIT) CAPS capsule Take 50,000 Units by mouth once a week. 10/26/19   [provider]    Family History Family History  Problem Relation Age of Onset   Hypertension Mother    Diabetes Mother    COPD Mother    Breast cancer Paternal Grandmother        dx in her 14s   Prostate cancer Paternal Grandfather    Prostate cancer Father 3   Prostate cancer Paternal Uncle    Breast cancer Other        paternal grandmother's sister   Breast cancer Paternal Aunt    Anesthesia problems Neg Hx    Amblyopia Neg Hx    Blindness Neg Hx    Cataracts Neg Hx    Glaucoma Neg Hx    Macular degeneration Neg Hx    Retinal detachment Neg Hx    Strabismus Neg Hx    Retinitis pigmentosa Neg Hx     Social History Social History   Tobacco Use   Smoking status: Never  Smoker   Smokeless tobacco: Never Used  Scientific laboratory technician Use: Never used  Substance Use Topics   Alcohol use: Yes    Alcohol/week: 1.0 standard drink    Types: 1 Cans of beer per week    Comment: occa   Drug use: No     Allergies   Shrimp [shellfish allergy] and Aleve [naproxen]   Review of Systems Review of Systems   Physical Exam Triage Vital Signs ED Triage  Vitals  Enc Vitals Group     BP 01/20/20 1337 140/81     Pulse Rate 01/20/20 1337 (!) 115     Resp 01/20/20 1337 20     Temp 01/20/20 1337 98 F (36.7 C)     Temp Source 01/20/20 1337 Oral     SpO2 01/20/20 1337 97 %     Weight --      Height --      Head Circumference --      Peak Flow --      Pain Score 01/20/20 1336 6     Pain Loc --      Pain Edu? --      Excl. in [place]? --    No data found.  Updated Vital Signs BP 140/81 (BP Location: Right Arm)    Pulse (!) 115    Temp 98 F (36.7 C) (Oral)    Resp 20    LMP 05/27/2017 (Approximate)    SpO2 97%   Visual Acuity Right Eye Distance:   Left Eye Distance:   Bilateral Distance:    Right Eye Near:   Left Eye Near:    Bilateral Near:     Physical Exam Constitutional:      General: She is not in acute distress.    Appearance: She is well-developed.  Cardiovascular:     Rate and Rhythm: Normal rate.  Pulmonary:     Effort: Pulmonary effort is normal.  Musculoskeletal:     Left wrist: Normal.     Left hand: Swelling, tenderness and bony tenderness present. Normal strength.       Arms:     Comments: Bruising and tenderness localized to palmar aspect of left hand overlying 5th mcp joint; mild tenderness to proximal phalanx of left hand 5th finger without redness swelling or warmth; full ROM of fingers; cap refill < 2 seconds    Skin:    General: Skin is warm and dry.  Neurological:     Mental Status: She is alert and oriented to person, place, and time.      UC Treatments / Results  Labs (all labs ordered are listed, but only abnormal  results are displayed) Labs Reviewed - No data to display  EKG   Radiology No results found.  Procedures Procedures (including critical care time)  Medications Ordered in UC Medications - No data to display  Initial Impression / Assessment and Plan / UC Course  I have reviewed the triage vital signs and the nursing notes.  Pertinent labs & imaging results that were available during my care of the patient were reviewed by me and considered in my medical decision making (see chart for details).     Arthritis vs contusion to left hand 5th mcp joint. Onset today. No known injury. Imaging deferred. Ace wrap placed per patient request for comfort. nsaids recommended. Follow up and return precautions discussed. Patient verbalized understanding and agreeable to plan.   Final Clinical Impressions(s) / UC Diagnoses   Final diagnoses:  Left hand pain     Discharge Instructions     Apply ice, elevate the hand, limit use as able, use of ace wrap as needed for comfort Please restart your celebrex as previously prescribed to help with pain and swelling.  Follow up with your primary care provider if persistent, return for any worsening of symptoms.    ED Prescriptions    None     PDMP not reviewed this encounter.   Zigmund Gottron,  NP 01/20/20 1419

## 2020-01-20 NOTE — ED Triage Notes (Signed)
Pt presents with left hand pain & swelling since this morning from unknown source.

## 2020-01-20 NOTE — Discharge Instructions (Signed)
Apply ice, elevate the hand, limit use as able, use of ace wrap as needed for comfort Please restart your celebrex as previously prescribed to help with pain and swelling.  Follow up with your primary care provider if persistent, return for any worsening of symptoms.

## 2020-01-25 ENCOUNTER — Other Ambulatory Visit: Payer: Self-pay

## 2020-01-25 ENCOUNTER — Ambulatory Visit (HOSPITAL_COMMUNITY)
Admission: EM | Admit: 2020-01-25 | Discharge: 2020-01-25 | Disposition: A | Discharge status: Home / Self Care | Payer: Medicaid Other | Attending: Nurse Practitioner | Admitting: Nurse Practitioner

## 2020-01-25 ENCOUNTER — Ambulatory Visit (INDEPENDENT_AMBULATORY_CARE_PROVIDER_SITE_OTHER): Payer: Medicaid Other

## 2020-01-25 ENCOUNTER — Encounter (HOSPITAL_COMMUNITY): Payer: Self-pay

## 2020-01-25 DIAGNOSIS — R059 Cough, unspecified: Secondary | ICD-10-CM

## 2020-01-25 DIAGNOSIS — Z20822 Contact with and (suspected) exposure to covid-19: Secondary | ICD-10-CM | POA: Insufficient documentation

## 2020-01-25 DIAGNOSIS — J069 Acute upper respiratory infection, unspecified: Secondary | ICD-10-CM | POA: Insufficient documentation

## 2020-01-25 DIAGNOSIS — J029 Acute pharyngitis, unspecified: Secondary | ICD-10-CM | POA: Insufficient documentation

## 2020-01-25 DIAGNOSIS — R0981 Nasal congestion: Secondary | ICD-10-CM

## 2020-01-25 LAB — SARS CORONAVIRUS 2 (TAT 6-24 HRS): SARS Coronavirus 2: NEGATIVE

## 2020-01-25 LAB — POCT RAPID STREP A, ED / UC: Streptococcus, Group A Screen (Direct): NEGATIVE

## 2020-01-25 MED ORDER — PROMETHAZINE-DM 6.25-15 MG/5ML PO SYRP
5.0000 mL | ORAL_SOLUTION | Freq: Four times a day (QID) | ORAL | 0 refills | Status: DC | PRN
Start: 2020-01-25 — End: 2021-05-24

## 2020-01-25 MED ORDER — PREDNISONE 20 MG PO TABS: 40.0000 mg | ORAL_TABLET | Freq: Every day | ORAL | 0 refills | Status: AC

## 2020-01-25 NOTE — ED Triage Notes (Signed)
Pt presents with non productive cough X 4 days; pt is fully vaccinated.

## 2020-01-25 NOTE — ED Provider Notes (Signed)
[place]    CSN: [id number] Arrival date & time: 01/25/20  1200      History   Chief Complaint Chief Complaint  Patient presents with  . Cough    HPI Sheryl Cox is a 49 y.o. female.   Subjective:   Sheryl Cox is a 48 y.o. female with history of hypertension, diabetes, and breast cancer (in remission x 7 years) who presents for evaluation of symptoms of a URI. Symptoms include chest congestion, sore throat and cough. Onset of symptoms was a few months ago but has been gradually worsening over the past couple of days. She denies any achiness, fever, headache, ear pain, nausea, vomiting, purulent nasal discharge, rash, shortness of breath, sinus pressure or wheezing. She does not smoke. Appetite normal. She is drinking plenty of fluids. Patient reports that she has been seen previously for similar symptoms and was thought to have a viral URI. She has been taking robitussin without any relief in symptoms. Notably, her granddaughter tested positive for strep earlier today. Patient completed the COVID vaccination in May 2021.   The following portions of the patient's history were reviewed and updated as appropriate: allergies, current medications, past family history, past medical history, past social history, past surgical history and problem list.       Past Medical History:  Diagnosis Date  . Anemia    history of blood transfusion-no abnormal reaction noted  . Asthma    Albuterol as needed  . 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 Diabetes mellitus without complication ([place])    takes Metformin daily  . History of migraine    several yrs ago  . Hypertension    takes Tribenzor daily    Patient Active Problem List   Diagnosis Date Noted  . Chronic pain of both knees 05/21/2018  . Chronic right shoulder pain 05/21/2018  . BMI 40.0-44.9, adult ([place]) 05/21/2018  . Anemia 06/05/2013  . Ductal carcinoma in situ (DCIS) of  right breast 11/24/2012  . Nabothian cyst 12/07/2011  . Cervical mass 11/20/2011  . Fibroid uterus 11/20/2011    Past Surgical History:  Procedure Laterality Date  . BIOPSY BREAST Left 12/05/12   fibroadenoma  . BREAST LUMPECTOMY     2014  . BREAST SURGERY Right   . CESAREAN SECTION  01/05/2009  . CHOLECYSTECTOMY N/A 01/31/2016   Procedure: LAPAROSCOPIC CHOLECYSTECTOMY;  Surgeon: Erroll Luna, MD;  Location: [facility];  Service: General;  Laterality: N/A;  . DILITATION & CURRETTAGE/HYSTROSCOPY WITH HYDROTHERMAL ABLATION N/A 08/12/2013   Procedure: DILATATION & CURETTAGE/HYSTEROSCOPY WITH HYDROTHERMAL ABLATION;  Surgeon: Frederico Hamman, MD;  Location: [facility];  Service: Gynecology;  Laterality: N/A;  . ROBOTIC ASSISTED TOTAL HYSTERECTOMY N/A 06/18/2017   Procedure: XI ROBOTIC ASSISTED TOTAL HYSTERECTOMY;  Surgeon: Lavonia Drafts, MD;  Location: [facility];  Service: Gynecology;  Laterality: N/A;  . SALPINGOOPHORECTOMY Bilateral 06/18/2017   Procedure: BILATERAL SALPINGO OOPHORECTOMY;  Surgeon: Lavonia Drafts, MD;  Location: [facility];  Service: Gynecology;  Laterality: Bilateral;    OB History    Gravida  1   Para  1   Term  1   Preterm  0   AB  0   Living  1     SAB  0   TAB  0   Ectopic  0   Multiple  0   Live Births  1            Home Medications  Prior to Admission medications   Medication Sig Start Date End Date Taking? Authorizing Provider  acetaminophen (TYLENOL) 325 MG tablet Take 650 mg by mouth daily as needed for headache.    [provider]  albuterol (VENTOLIN HFA) 108 (90 Base) MCG/ACT inhaler Inhale 1-2 puffs into the lungs every 6 (six) hours as needed for wheezing or shortness of breath. 09/21/19   Wieters, Hallie C, PA-C  ALPRAZolam (XANAX) 0.25 MG tablet TAKE 1 TABLET BY MOUTH TWICE A DAY AS NEEDED FOR ANXIETY 08/25/18   Lucianne Lei, MD  amLODipine (NORVASC) 10 MG tablet Take 10 mg by mouth daily. 02/23/17   [provider]  Ascorbic Acid (VITAMIN C PO) Take by mouth.    [provider]  celecoxib (CELEBREX) 200 MG capsule Take 1 capsule (200 mg total) by mouth 2 (two) times daily. 10/20/19   Margarita Mail, PA-C  cyclobenzaprine (FLEXERIL) 10 MG tablet Take 0.5-1 tablets (5-10 mg total) by mouth 2 (two) times daily as needed for muscle spasms. 10/20/19   Harris, Abigail, PA-C  ELDERBERRY PO Take by mouth.    [provider]  fluticasone (FLONASE) 50 MCG/ACT nasal spray Place 2 sprays into both nostrils daily. 05/12/18   Tasia Catchings, Amy V, PA-C  hydrochlorothiazide (HYDRODIURIL) 12.5 MG tablet Take 2 tablets (25 mg total) by mouth daily. Patient taking differently: Take 12.5 mg by mouth daily.  05/14/18   Scot Jun, FNP  meloxicam (MOBIC) 15 MG tablet Take 1 tablet (15 mg total) by mouth daily. 05/14/18   Scot Jun, FNP  metFORMIN (GLUCOPHAGE) 500 MG tablet Take 1 tablet (500 mg total) by mouth 2 (two) times daily with a meal. Patient taking differently: Take 500 mg by mouth daily.  06/19/17   Lavonia Drafts, MD  Multiple Vitamins-Minerals (MULTIVITAMIN PO) Take 1 tablet by mouth daily.    [provider]  pantoprazole (PROTONIX) 20 MG tablet TAKE 1 TABLET BY MOUTH EVERY DAY 12/29/19   Nicholas Lose, MD  Semaglutide (RYBELSUS) 7 MG TABS Take by mouth.    [provider]  tamoxifen (NOLVADEX) 20 MG tablet Take 1 tablet (20 mg total) by mouth daily. 09/11/19   Nicholas Lose, MD  traMADol (ULTRAM) 50 MG tablet SMARTSIG:1 Tablet(s) By Mouth Every 12 Hours PRN 10/26/19   [provider]  Vitamin D, Ergocalciferol, (DRISDOL) 1.25 MG (50000 UNIT) CAPS capsule Take 50,000 Units by mouth once a week. 10/26/19   [provider]    Family History Family History  Problem Relation Age of Onset  . Hypertension Mother   . Diabetes Mother   . COPD Mother   . Breast cancer Paternal Grandmother        dx in her 71s  . Prostate cancer Paternal  Grandfather   . Prostate cancer Father 14  . Prostate cancer Paternal Uncle   . Breast cancer Other        paternal grandmother's sister  . Breast cancer Paternal Aunt   . Anesthesia problems Neg Hx   . Amblyopia Neg Hx   . Blindness Neg Hx   . Cataracts Neg Hx   . Glaucoma Neg Hx   . Macular degeneration Neg Hx   . Retinal detachment Neg Hx   . Strabismus Neg Hx   . Retinitis pigmentosa Neg Hx     Social History Social History   Tobacco Use  . Smoking status: Never Smoker  . Smokeless tobacco: Never Used  Vaping Use  . Vaping Use: Never  used  Substance Use Topics  . Alcohol use: Yes    Alcohol/week: 1.0 standard drink    Types: 1 Cans of beer per week    Comment: occa  . Drug use: No     Allergies   Shrimp [shellfish allergy] and Aleve [naproxen]   Review of Systems Review of Systems  Constitutional: Negative for appetite change and fever.  HENT: Positive for congestion and sore throat. Negative for ear pain, rhinorrhea and trouble swallowing.   Respiratory: Positive for cough. Negative for shortness of breath and wheezing.   Gastrointestinal: Negative for diarrhea, nausea and vomiting.  Musculoskeletal: Negative for myalgias.  Skin: Negative for rash.  Neurological: Negative for headaches.  All other systems reviewed and are negative.    Physical Exam Triage Vital Signs ED Triage Vitals  Enc Vitals Group     BP 01/25/20 1416 (!) 152/114     Pulse Rate 01/25/20 1416 98     Resp 01/25/20 1416 20     Temp 01/25/20 1416 98.4 F (36.9 C)     Temp Source 01/25/20 1416 Oral     SpO2 01/25/20 1416 97 %     Weight --      Height --      Head Circumference --      Peak Flow --      Pain Score 01/25/20 1415 2     Pain Loc --      Pain Edu? --      Excl. in [place]? --    No data found.  Updated Vital Signs BP (!) 152/114 (BP Location: Right Arm)   Pulse 98   Temp 98.4 F (36.9 C) (Oral)   Resp 20   LMP 05/27/2017 (Approximate)   SpO2 97%   Visual  Acuity Right Eye Distance:   Left Eye Distance:   Bilateral Distance:    Right Eye Near:   Left Eye Near:    Bilateral Near:     Physical Exam Vitals reviewed.  Constitutional:      Appearance: Normal appearance.  HENT:     Head: Normocephalic.     Right Ear: Tympanic membrane, ear canal and external ear normal.     Left Ear: Tympanic membrane, ear canal and external ear normal.     Nose: Nose normal.     Mouth/Throat:     Mouth: Mucous membranes are moist.  Eyes:     Conjunctiva/sclera: Conjunctivae normal.  Cardiovascular:     Rate and Rhythm: Normal rate and regular rhythm.  Pulmonary:     Effort: Pulmonary effort is normal.     Breath sounds: Normal breath sounds.  Musculoskeletal:        General: Normal range of motion.     Cervical back: Normal range of motion and neck supple.  Lymphadenopathy:     Cervical: No cervical adenopathy.  Skin:    General: Skin is warm and dry.  Neurological:     General: No focal deficit present.     Mental Status: She is alert and oriented to person, place, and time.  Psychiatric:        Mood and Affect: Mood normal.        Behavior: Behavior normal.      UC Treatments / Results  Labs (all labs ordered are listed, but only abnormal results are displayed) Labs Reviewed  SARS CORONAVIRUS 2 (TAT 6-24 HRS)  POCT RAPID STREP A, ED / UC    EKG   Radiology No results  found.  Procedures Procedures (including critical care time)  Medications Ordered in UC Medications - No data to display  Initial Impression / Assessment and Plan / UC Course  I have reviewed the triage vital signs and the nursing notes.  Pertinent labs & imaging results that were available during my care of the patient were reviewed by me and considered in my medical decision making (see chart for details).    49 yo female presenting with chest congestion, sore throat and cough. Symptoms have been waxing and waning over the past couple of months but has  worsened over the past few days. Robitussin not helpful. Her granddaughter tested positive for strep earlier today. Patient is afebrile. Nontoxic appearing. Physical exam unremarkable. She does not smoke. History of breast cancer but has been in remission for the past 7 years. She completed her COVID vaccination about 6 months ago. She has not had her booster yet. Rapid strep negative. CXR negative. COVID test and throat cultures pending. Discussed diagnosis and treatment of URI. Suggested symptomatic OTC remedies. Nasal saline spray for congestion. Follow up as needed.  Today's evaluation has revealed no signs of a dangerous process. Discussed diagnosis with patient and/or guardian. Patient and/or guardian aware of their diagnosis, possible red flag symptoms to watch out for and need for close follow up. Patient and/or guardian understands verbal and written discharge instructions. Patient and/or guardian comfortable with plan and disposition.  Patient and/or guardian has a clear mental status at this time, good insight into illness (after discussion and teaching) and has clear judgment to make decisions regarding their care  This care was provided during an unprecedented National Emergency due to the Novel Coronavirus (COVID-19) pandemic. COVID-19 infections and transmission risks place heavy strains on healthcare resources.  As this pandemic evolves, our facility, providers, and staff strive to respond fluidly, to remain operational, and to provide care relative to available resources and information. Outcomes are unpredictable and treatments are without well-defined guidelines. Further, the impact of COVID-19 on all aspects of urgent care, including the impact to patients seeking care for reasons other than COVID-19, is unavoidable during this national emergency. At this time of the global pandemic, management of patients has significantly changed, even for non-COVID positive patients given high local and  regional COVID volumes at this time requiring high healthcare system and resource utilization. The standard of care for management of both COVID suspected and non-COVID suspected patients continues to change rapidly at the local, regional, national, and global levels. This patient was worked up and treated to the best available but ever changing evidence and resources available at this current time.   Documentation was completed with the aid of voice recognition software. Transcription may contain typographical errors.   Final Clinical Impressions(s) / UC Diagnoses   Final diagnoses:  None   Discharge Instructions   None    ED Prescriptions    None     PDMP not reviewed this encounter.   Enrique Sack, [place] 01/25/20 1512

## 2020-01-25 NOTE — Discharge Instructions (Signed)
Take medications as prescribed. You may take tylenol or ibuprofen as needed for fevers/headache/body aches. Drink plenty of fluids. Stay in home isolation until you receive results of your COVID test. You will only be notified for positive results. You may go online to MyChart in the next few days and review your results. Go to the ED immediately if you get worse or have any other symptoms.

## 2020-01-28 LAB — CULTURE, GROUP A STREP (THRC)

## 2020-02-05 ENCOUNTER — Ambulatory Visit: Payer: Medicaid Other | Attending: Internal Medicine

## 2020-02-05 DIAGNOSIS — Z23 Encounter for immunization: Secondary | ICD-10-CM

## 2020-02-05 NOTE — Progress Notes (Signed)
° °  Covid-19 Vaccination Clinic  Name:  Sheryl Cox    MRN: [id number] DOB: 1970/10/01  02/05/2020  Sheryl Cox was observed post Covid-19 immunization for 15 minutes without incident. She was provided with Vaccine Information Sheet and instruction to access the V-Safe system.   Sheryl Cox was instructed to call 911 with any severe reactions post vaccine:  Difficulty breathing   Swelling of face and throat   A fast heartbeat   A bad rash all over body   Dizziness and weakness   Immunizations Administered    Name Date Dose VIS Date Route   Pfizer COVID-19 Vaccine 02/05/2020  3:49 PM 0.3 mL 01/06/2020 Intramuscular   Manufacturer: [place]   Lot: [id number]   [place]: [id number]

## 2020-04-06 ENCOUNTER — Encounter (HOSPITAL_COMMUNITY): Payer: Self-pay

## 2020-04-06 ENCOUNTER — Ambulatory Visit (HOSPITAL_COMMUNITY)
Admission: EM | Admit: 2020-04-06 | Discharge: 2020-04-06 | Disposition: A | Discharge status: Home / Self Care | Payer: Medicaid Other | Attending: Medical Oncology | Admitting: Medical Oncology

## 2020-04-06 ENCOUNTER — Other Ambulatory Visit: Payer: Self-pay

## 2020-04-06 DIAGNOSIS — J00 Acute nasopharyngitis [common cold]: Secondary | ICD-10-CM | POA: Insufficient documentation

## 2020-04-06 DIAGNOSIS — R059 Cough, unspecified: Secondary | ICD-10-CM | POA: Insufficient documentation

## 2020-04-06 DIAGNOSIS — Z7984 Long term (current) use of oral hypoglycemic drugs: Secondary | ICD-10-CM | POA: Insufficient documentation

## 2020-04-06 DIAGNOSIS — Z20822 Contact with and (suspected) exposure to covid-19: Secondary | ICD-10-CM | POA: Insufficient documentation

## 2020-04-06 DIAGNOSIS — R110 Nausea: Secondary | ICD-10-CM | POA: Insufficient documentation

## 2020-04-06 DIAGNOSIS — R1084 Generalized abdominal pain: Secondary | ICD-10-CM | POA: Insufficient documentation

## 2020-04-06 MED ORDER — ONDANSETRON HCL 4 MG PO TABS: 4.0000 mg | ORAL_TABLET | Freq: Four times a day (QID) | ORAL | 0 refills | Status: DC

## 2020-04-06 MED ORDER — BENZONATATE 100 MG PO CAPS: 100.0000 mg | ORAL_CAPSULE | Freq: Three times a day (TID) | ORAL | 0 refills | Status: DC

## 2020-04-06 NOTE — ED Triage Notes (Signed)
Pt presents with stomach pain and a cough X 2 days. Pt states she has not taken OTC medicine for sx relief.

## 2020-04-06 NOTE — ED Provider Notes (Signed)
[place]    CSN: [id number] Arrival date & time: 04/06/20  1519      History   Chief Complaint Chief Complaint  Patient presents with  . Abdominal Pain  . Cough    HPI Sheryl Cox is a 50 y.o. female.   HPI   Two days ago she awoke with abdominal discomfort and a cough. Symptoms have not improved but have not worsened. Abdominal pain is described as a nauseous feeling 8/10 in nature and is generalized in nature but mainly in her upper abdominal area. Cough is a mostly dry cough with occasional yellow sputum. No vomiting, some diarrhea, dysuria, no new discharge. No chest pains, wheezing but does have mild SOB. No fevers. Cough does not seem to worsen her abdominal pain. Has had some coworkers who have tested positive for COVID-19. She has not tried anything for symptoms. She has had a total hysterectomy.   Past Medical History:  Diagnosis Date  . Anemia    history of blood transfusion-no abnormal reaction noted  . Asthma    Albuterol as needed  . 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 Diabetes mellitus without complication ([place])    takes Metformin daily  . History of migraine    several yrs ago  . Hypertension    takes Tribenzor daily    Patient Active Problem List   Diagnosis Date Noted  . Chronic pain of both knees 05/21/2018  . Chronic right shoulder pain 05/21/2018  . BMI 40.0-44.9, adult ([place]) 05/21/2018  . Anemia 06/05/2013  . 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 Nabothian cyst 12/07/2011  . Cervical mass 11/20/2011  . Fibroid uterus 11/20/2011    Past Surgical History:  Procedure Laterality Date  . BIOPSY BREAST Left 12/05/12   fibroadenoma  . BREAST LUMPECTOMY     2014  . BREAST SURGERY Right   . CESAREAN SECTION  01/05/2009  . CHOLECYSTECTOMY N/A 01/31/2016   Procedure: LAPAROSCOPIC CHOLECYSTECTOMY;  Surgeon: Erroll Luna, MD;  Location: [facility];  Service: General;   Laterality: N/A;  . DILITATION & CURRETTAGE/HYSTROSCOPY WITH HYDROTHERMAL ABLATION N/A 08/12/2013   Procedure: DILATATION & CURETTAGE/HYSTEROSCOPY WITH HYDROTHERMAL ABLATION;  Surgeon: Frederico Hamman, MD;  Location: [facility];  Service: Gynecology;  Laterality: N/A;  . ROBOTIC ASSISTED TOTAL HYSTERECTOMY N/A 06/18/2017   Procedure: XI ROBOTIC ASSISTED TOTAL HYSTERECTOMY;  Surgeon: Lavonia Drafts, MD;  Location: [facility];  Service: Gynecology;  Laterality: N/A;  . SALPINGOOPHORECTOMY Bilateral 06/18/2017   Procedure: BILATERAL SALPINGO OOPHORECTOMY;  Surgeon: Lavonia Drafts, MD;  Location: [facility];  Service: Gynecology;  Laterality: Bilateral;    OB History    Gravida  1   Para  1   Term  1   Preterm  0   AB  0   Living  1     SAB  0   IAB  0   Ectopic  0   Multiple  0   Live Births  1            Home Medications    Prior to Admission medications   Medication Sig Start Date End Date Taking? Authorizing Provider  acetaminophen (TYLENOL) 325 MG tablet Take 650 mg by mouth daily as needed for headache.    [provider]  albuterol (VENTOLIN HFA) 108 (90 Base) MCG/ACT inhaler Inhale 1-2 puffs into the lungs every 6 (six) hours as needed for wheezing or shortness of breath. 09/21/19  Wieters, Hallie C, PA-C  ALPRAZolam (XANAX) 0.25 MG tablet TAKE 1 TABLET BY MOUTH TWICE A DAY AS NEEDED FOR ANXIETY 08/25/18   Lucianne Lei, MD  amLODipine (NORVASC) 10 MG tablet Take 10 mg by mouth daily. 02/23/17   [provider]  Ascorbic Acid (VITAMIN C PO) Take by mouth.    [provider]  celecoxib (CELEBREX) 200 MG capsule Take 1 capsule (200 mg total) by mouth 2 (two) times daily. 10/20/19   Margarita Mail, PA-C  cyclobenzaprine (FLEXERIL) 10 MG tablet Take 0.5-1 tablets (5-10 mg total) by mouth 2 (two) times daily as needed for muscle spasms. 10/20/19   Harris, Abigail, PA-C  ELDERBERRY PO Take by mouth.    [provider]  fluticasone  (FLONASE) 50 MCG/ACT nasal spray Place 2 sprays into both nostrils daily. 05/12/18   Tasia Catchings, Amy V, PA-C  hydrochlorothiazide (HYDRODIURIL) 12.5 MG tablet Take 2 tablets (25 mg total) by mouth daily. Patient taking differently: Take 12.5 mg by mouth daily.  05/14/18   Scot Jun, FNP  meloxicam (MOBIC) 15 MG tablet Take 1 tablet (15 mg total) by mouth daily. 05/14/18   Scot Jun, FNP  metFORMIN (GLUCOPHAGE) 500 MG tablet Take 1 tablet (500 mg total) by mouth 2 (two) times daily with a meal. Patient taking differently: Take 500 mg by mouth daily.  06/19/17   Lavonia Drafts, MD  Multiple Vitamins-Minerals (MULTIVITAMIN PO) Take 1 tablet by mouth daily.    [provider]  pantoprazole (PROTONIX) 20 MG tablet TAKE 1 TABLET BY MOUTH EVERY DAY 12/29/19   Nicholas Lose, MD  promethazine-dextromethorphan (PROMETHAZINE-DM) 6.25-15 MG/5ML syrup Take 5 mLs by mouth 4 (four) times daily as needed for cough. 01/25/20   Enrique Sack, FNP  Semaglutide (RYBELSUS) 7 MG TABS Take by mouth.    [provider]  tamoxifen (NOLVADEX) 20 MG tablet Take 1 tablet (20 mg total) by mouth daily. 09/11/19   Nicholas Lose, MD  traMADol (ULTRAM) 50 MG tablet SMARTSIG:1 Tablet(s) By Mouth Every 12 Hours PRN 10/26/19   [provider]  Vitamin D, Ergocalciferol, (DRISDOL) 1.25 MG (50000 UNIT) CAPS capsule Take 50,000 Units by mouth once a week. 10/26/19   [provider]    Family History Family History  Problem Relation Age of Onset  . Hypertension Mother   . Diabetes Mother   . COPD Mother   . Breast cancer Paternal Grandmother        dx in her 107s  . Prostate cancer Paternal Grandfather   . Prostate cancer Father 8  . Prostate cancer Paternal Uncle   . Breast cancer Other        paternal grandmother's sister  . Breast cancer Paternal Aunt   . Anesthesia problems Neg Hx   . Amblyopia Neg Hx   . Blindness Neg Hx   . Cataracts Neg Hx   . Glaucoma Neg Hx   .  Macular degeneration Neg Hx   . Retinal detachment Neg Hx   . Strabismus Neg Hx   . Retinitis pigmentosa Neg Hx     Social History Social History   Tobacco Use  . Smoking status: Never Smoker  . Smokeless tobacco: Never Used  Vaping Use  . Vaping Use: Never used  Substance Use Topics  . Alcohol use: Yes    Alcohol/week: 1.0 standard drink    Types: 1 Cans of beer per week    Comment: occa  . Drug use: No     Allergies  Shrimp [shellfish allergy] and Aleve [naproxen]   Review of Systems Review of Systems  Constitutional: Negative for chills and fever.  HENT: Negative for congestion and sore throat.   Gastrointestinal: Positive for abdominal pain and nausea. Negative for blood in stool, constipation, diarrhea and vomiting.  Genitourinary: Negative for difficulty urinating, dysuria, flank pain, frequency, hematuria, pelvic pain and urgency.  Musculoskeletal: Negative for back pain.     Physical Exam Triage Vital Signs ED Triage Vitals  Enc Vitals Group     BP 04/06/20 1601 (!) 145/100     Pulse Rate 04/06/20 1601 (!) 111     Resp 04/06/20 1601 16     Temp 04/06/20 1601 99.8 F (37.7 C)     Temp Source 04/06/20 1601 Oral     SpO2 04/06/20 1601 100 %     Weight --      Height --      Head Circumference --      Peak Flow --      Pain Score 04/06/20 1600 8     Pain Loc --      Pain Edu? --      Excl. in [place]? --    No data found.  Updated Vital Signs BP (!) 145/100 (BP Location: Right Arm)   Pulse (!) 111   Temp 99.8 F (37.7 C) (Oral)   Resp 16   LMP 05/27/2017 (Approximate)   SpO2 100%   Visual Acuity Right Eye Distance:   Left Eye Distance:   Bilateral Distance:    Right Eye Near:   Left Eye Near:    Bilateral Near:     Physical Exam Constitutional:      Appearance: She is well-developed.  HENT:     Head: Normocephalic.     Mouth/Throat:     Mouth: Mucous membranes are moist.     Pharynx: Oropharynx is clear.  Cardiovascular:     Rate  and Rhythm: Normal rate and regular rhythm.     Heart sounds: Normal heart sounds. No murmur heard. No friction rub. No gallop.   Pulmonary:     Effort: Pulmonary effort is normal. No respiratory distress.     Breath sounds: Normal breath sounds. No wheezing or rhonchi.  Abdominal:     General: Bowel sounds are normal. There is no distension.     Palpations: Abdomen is soft. There is no shifting dullness, fluid wave, hepatomegaly, splenomegaly, mass or pulsatile mass.     Tenderness: There is no right CVA tenderness, left CVA tenderness, guarding or rebound.     Hernia: No hernia is present.  Skin:    General: Skin is warm and dry.  Neurological:     Mental Status: She is alert.      UC Treatments / Results  Labs (all labs ordered are listed, but only abnormal results are displayed) Labs Reviewed  SARS CORONAVIRUS 2 (TAT 6-24 HRS)    EKG   Radiology No results found.  Procedures Procedures (including critical care time)  Medications Ordered in UC Medications - No data to display  Initial Impression / Assessment and Plan / UC Course  I have reviewed the triage vital signs and the nursing notes.  Pertinent labs & imaging results that were available during my care of the patient were reviewed by me and considered in my medical decision making (see chart for details).     New. Likely viral in nature. COVID-19 testing pending. For now rest, hydration, symptoms monitoring and discussion of  red flag symptoms and quarantine guidelines.   Final Clinical Impressions(s) / UC Diagnoses   Final diagnoses:  Acute nasopharyngitis  Nausea   Discharge Instructions   None    ED Prescriptions    None     PDMP not reviewed this encounter.   Hughie Closs, [place] 04/06/20 2019

## 2020-04-07 LAB — SARS CORONAVIRUS 2 (TAT 6-24 HRS): SARS Coronavirus 2: NEGATIVE

## 2020-04-19 ENCOUNTER — Other Ambulatory Visit (HOSPITAL_COMMUNITY): Payer: Self-pay | Admitting: Urgent Care

## 2020-04-19 ENCOUNTER — Ambulatory Visit (HOSPITAL_COMMUNITY)
Admission: EM | Admit: 2020-04-19 | Discharge: 2020-04-19 | Disposition: A | Discharge status: Home / Self Care | Payer: Medicaid Other | Attending: Urgent Care | Admitting: Urgent Care

## 2020-04-19 ENCOUNTER — Ambulatory Visit (HOSPITAL_COMMUNITY): Payer: Self-pay

## 2020-04-19 ENCOUNTER — Encounter (HOSPITAL_COMMUNITY): Payer: Self-pay

## 2020-04-19 ENCOUNTER — Other Ambulatory Visit: Payer: Self-pay

## 2020-04-19 ENCOUNTER — Ambulatory Visit (HOSPITAL_COMMUNITY)
Admission: RE | Admit: 2020-04-19 | Discharge: 2020-04-19 | Disposition: A | Discharge status: Home / Self Care | Payer: Medicaid Other | Source: Ambulatory Visit | Attending: Urgent Care | Admitting: Urgent Care

## 2020-04-19 DIAGNOSIS — M79662 Pain in left lower leg: Secondary | ICD-10-CM | POA: Insufficient documentation

## 2020-04-19 DIAGNOSIS — M7989 Other specified soft tissue disorders: Secondary | ICD-10-CM

## 2020-04-19 DIAGNOSIS — M79605 Pain in left leg: Secondary | ICD-10-CM | POA: Diagnosis not present

## 2020-04-19 DIAGNOSIS — M79609 Pain in unspecified limb: Secondary | ICD-10-CM

## 2020-04-19 DIAGNOSIS — M79652 Pain in left thigh: Secondary | ICD-10-CM

## 2020-04-19 DIAGNOSIS — M79622 Pain in left upper arm: Secondary | ICD-10-CM

## 2020-04-19 MED ORDER — MELOXICAM 7.5 MG PO TABS: 7.5000 mg | ORAL_TABLET | Freq: Every day | ORAL | 0 refills | Status: DC

## 2020-04-19 MED ORDER — TIZANIDINE HCL 4 MG PO TABS: 4.0000 mg | ORAL_TABLET | Freq: Every day | ORAL | 0 refills | Status: DC

## 2020-04-19 NOTE — Discharge Instructions (Signed)
Please report to the [facility] outpatient imaging facility at the address below:  [address] [place], [place]  Your appointment is at 4 PM.  Please call [phone] for any questions or concerns.  Please try to show up 15 minutes ahead of your appointment time.  I will call you with your results and treatment plan after I get the report.

## 2020-04-19 NOTE — ED Notes (Signed)
Appt for DVT ultrasound schedule for 1600 at [facility].

## 2020-04-19 NOTE — ED Provider Notes (Signed)
[place]   MRN: [id number] DOB: Feb 12, 1971  Subjective:   Sheryl Cox is a 50 y.o. female presenting for 2 to 3-week history of persistent upper left arm pain, left thigh pain now having pain of her left lower leg.  Feels like her left leg is swollen.  Denies falls, trauma.  No chest pain, shortness of breath.  Denies history of clotting disorder, blood clots.  Patient did have a history of breast cancer of the left breast, was treated in 2014 and has been in remission.  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  She works 6 to 7 days a week.  Denies any particular work injuries.  No current facility-administered medications for this encounter.  Current Outpatient Medications:  .  acetaminophen (TYLENOL) 325 MG tablet, Take 650 mg by mouth daily as needed for headache., Disp: , Rfl:  .  albuterol (VENTOLIN HFA) 108 (90 Base) MCG/ACT inhaler, Inhale 1-2 puffs into the lungs every 6 (six) hours as needed for wheezing or shortness of breath., Disp: 18 g, Rfl: 0 .  ALPRAZolam (XANAX) 0.25 MG tablet, TAKE 1 TABLET BY MOUTH TWICE A DAY AS NEEDED FOR ANXIETY, Disp: , Rfl:  .  amLODipine (NORVASC) 10 MG tablet, Take 10 mg by mouth daily., Disp: , Rfl: 3 .  Ascorbic Acid (VITAMIN C PO), Take by mouth., Disp: , Rfl:  .  benzonatate (TESSALON) 100 MG capsule, Take 1 capsule (100 mg total) by mouth every 8 (eight) hours., Disp: 21 capsule, Rfl: 0 .  celecoxib (CELEBREX) 200 MG capsule, Take 1 capsule (200 mg total) by mouth 2 (two) times daily., Disp: 20 capsule, Rfl: 0 .  cyclobenzaprine (FLEXERIL) 10 MG tablet, Take 0.5-1 tablets (5-10 mg total) by mouth 2 (two) times daily as needed for muscle spasms., Disp: 20 tablet, Rfl: 0 .  ELDERBERRY PO, Take by mouth., Disp: , Rfl:  .  fluticasone (FLONASE) 50 MCG/ACT nasal spray, Place 2 sprays into both nostrils daily., Disp: 1 g, Rfl: 0 .  hydrochlorothiazide (HYDRODIURIL)  12.5 MG tablet, Take 2 tablets (25 mg total) by mouth daily. (Patient taking differently: Take 12.5 mg by mouth daily. ), Disp: 30 tablet, Rfl: 3 .  meloxicam (MOBIC) 15 MG tablet, Take 1 tablet (15 mg total) by mouth daily., Disp: 30 tablet, Rfl: 0 .  metFORMIN (GLUCOPHAGE) 500 MG tablet, Take 1 tablet (500 mg total) by mouth 2 (two) times daily with a meal. (Patient taking differently: Take 500 mg by mouth daily. ), Disp: 60 tablet, Rfl: 2 .  Multiple Vitamins-Minerals (MULTIVITAMIN PO), Take 1 tablet by mouth daily., Disp: , Rfl:  .  ondansetron (ZOFRAN) 4 MG tablet, Take 1 tablet (4 mg total) by mouth every 6 (six) hours., Disp: 12 tablet, Rfl: 0 .  pantoprazole (PROTONIX) 20 MG tablet, TAKE 1 TABLET BY MOUTH EVERY DAY, Disp: 30 tablet, Rfl: 0 .  promethazine-dextromethorphan (PROMETHAZINE-DM) 6.25-15 MG/5ML syrup, Take 5 mLs by mouth 4 (four) times daily as needed for cough., Disp: 90 mL, Rfl: 0 .  Semaglutide (RYBELSUS) 7 MG TABS, Take by mouth., Disp: , Rfl:  .  tamoxifen (NOLVADEX) 20 MG tablet, Take 1 tablet (20 mg total) by mouth daily., Disp: 90 tablet, Rfl: 3 .  traMADol (ULTRAM) 50 MG tablet, SMARTSIG:1 Tablet(s) By Mouth Every 12 Hours PRN, Disp: , Rfl:  .  Vitamin D, Ergocalciferol, (DRISDOL) 1.25 MG (50000 UNIT) CAPS capsule, Take 50,000  Units by mouth once a week., Disp: , Rfl:    Allergies  Allergen Reactions  . Shrimp [Shellfish Allergy] Anaphylaxis and Swelling  . Aleve [Naproxen] Hypertension    MD told the patient to not take this because it will elevate her B/P    Past Medical History:  Diagnosis Date  . Anemia    history of blood transfusion-no abnormal reaction noted  . Asthma    Albuterol as needed  . 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 Diabetes mellitus without complication ([place])    takes Metformin daily  . History of migraine    several yrs ago  . Hypertension    takes Tribenzor daily     Past Surgical History:  Procedure  Laterality Date  . BIOPSY BREAST Left 12/05/12   fibroadenoma  . BREAST LUMPECTOMY     2014  . BREAST SURGERY Right   . CESAREAN SECTION  01/05/2009  . CHOLECYSTECTOMY N/A 01/31/2016   Procedure: LAPAROSCOPIC CHOLECYSTECTOMY;  Surgeon: Erroll Luna, MD;  Location: [facility];  Service: General;  Laterality: N/A;  . DILITATION & CURRETTAGE/HYSTROSCOPY WITH HYDROTHERMAL ABLATION N/A 08/12/2013   Procedure: DILATATION & CURETTAGE/HYSTEROSCOPY WITH HYDROTHERMAL ABLATION;  Surgeon: Frederico Hamman, MD;  Location: [facility];  Service: Gynecology;  Laterality: N/A;  . ROBOTIC ASSISTED TOTAL HYSTERECTOMY N/A 06/18/2017   Procedure: XI ROBOTIC ASSISTED TOTAL HYSTERECTOMY;  Surgeon: Lavonia Drafts, MD;  Location: [facility];  Service: Gynecology;  Laterality: N/A;  . SALPINGOOPHORECTOMY Bilateral 06/18/2017   Procedure: BILATERAL SALPINGO OOPHORECTOMY;  Surgeon: Lavonia Drafts, MD;  Location: [facility];  Service: Gynecology;  Laterality: Bilateral;    Family History  Problem Relation Age of Onset  . Hypertension Mother   . Diabetes Mother   . COPD Mother   . Breast cancer Paternal Grandmother        dx in her 61s  . Prostate cancer Paternal Grandfather   . Prostate cancer Father 59  . Prostate cancer Paternal Uncle   . Breast cancer Other        paternal grandmother's sister  . Breast cancer Paternal Aunt   . Anesthesia problems Neg Hx   . Amblyopia Neg Hx   . Blindness Neg Hx   . Cataracts Neg Hx   . Glaucoma Neg Hx   . Macular degeneration Neg Hx   . Retinal detachment Neg Hx   . Strabismus Neg Hx   . Retinitis pigmentosa Neg Hx     Social History   Tobacco Use  . Smoking status: Never Smoker  . Smokeless tobacco: Never Used  Vaping Use  . Vaping Use: Never used  Substance Use Topics  . Alcohol use: Yes    Alcohol/week: 1.0 standard drink    Types: 1 Cans of beer per week    Comment: occa  . Drug use: No    ROS   Objective:   Vitals: BP (!) 154/96 (BP Location:  Right Arm)   Pulse (!) 110   Temp 99.1 F (37.3 C) (Oral)   Resp 20   LMP 05/27/2017 (Approximate)   SpO2 96%   Wt Readings from Last 3 Encounters:  12/01/19 258 lb 6.4 oz (117.2 kg)  11/02/19 251 lb 6.4 oz (114 kg)  10/20/19 250 lb (113.4 kg)   Temp Readings from Last 3 Encounters:  04/19/20 99.1 F (37.3 C) (Oral)  04/06/20 99.8 F (37.7 C) (Oral)  01/25/20 98.4 F (36.9 C) (Oral)   BP Readings from Last 3  Encounters:  04/19/20 (!) 154/96  04/06/20 (!) 145/100  01/25/20 (!) 152/114   Pulse Readings from Last 3 Encounters:  04/19/20 (!) 110  04/06/20 (!) 111  01/25/20 98   Pulse recheck was 107-110bpm.   Physical Exam Constitutional:      General: She is not in acute distress.    Appearance: Normal appearance. She is well-developed. She is not ill-appearing, toxic-appearing or diaphoretic.  HENT:     Head: Normocephalic and atraumatic.     Nose: Nose normal.     Mouth/Throat:     Mouth: Mucous membranes are moist.  Eyes:     Extraocular Movements: Extraocular movements intact.     Pupils: Pupils are equal, round, and reactive to light.  Cardiovascular:     Rate and Rhythm: Normal rate and regular rhythm.     Pulses: Normal pulses.     Heart sounds: Normal heart sounds. No murmur heard. No friction rub. No gallop.   Pulmonary:     Effort: Pulmonary effort is normal. No respiratory distress.     Breath sounds: Normal breath sounds. No stridor. No wheezing, rhonchi or rales.  Musculoskeletal:       Arms:       Legs:  Skin:    General: Skin is warm and dry.     Findings: No rash.  Neurological:     Mental Status: She is alert and oriented to person, place, and time.     Motor: No weakness.     Coordination: Coordination normal.     Gait: Gait normal.     Deep Tendon Reflexes: Reflexes normal.  Psychiatric:        Mood and Affect: Mood normal.        Behavior: Behavior normal.        Thought Content: Thought content normal.        Judgment: Judgment  normal.     Assessment and Plan :   PDMP not reviewed this encounter.  1. Left leg pain   2. Pain of left calf   3. Left thigh pain   4. Left upper arm pain     Case discussed with Dr. Lanny Cramp. Suspect that her pain is musculoskeletal in nature.  However will rule out DVT with ultrasound.  She does have tachycardia but may be more weight related as she has morbid obesity and previous vital signs have been borderline for tachycardia.  Low suspicion for chest PE given lack of chest pain, shortness of breath and reassuring pulse oximetry 96%. Will review results with patient and treat with NSAID, muscle relaxant if negative [place].    Jaynee Eagles, PA-C 04/19/20 1353

## 2020-04-19 NOTE — ED Triage Notes (Signed)
Pt reports pain and numbness in  left ram x 2-3 weeks, worsens when laying down over the arm; left leg pain and swelling x 2-3 days. Pt has not tried OTC for the complaints. Denies chest pain, dizziness, SOB, vision changes, fall.

## 2020-04-19 NOTE — Progress Notes (Signed)
Called and reported negative ultrasound.  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  Recommended she cut back on her schedule. Counseled patient on potential for adverse effects with medications prescribed/recommended today, ER and return-to-clinic precautions discussed, patient verbalized understanding.

## 2020-04-22 ENCOUNTER — Other Ambulatory Visit: Payer: Medicaid Other

## 2020-04-22 DIAGNOSIS — Z20822 Contact with and (suspected) exposure to covid-19: Secondary | ICD-10-CM

## 2020-04-23 LAB — SARS-COV-2, NAA 2 DAY TAT

## 2020-04-23 LAB — NOVEL CORONAVIRUS, NAA: SARS-CoV-2, NAA: NOT DETECTED

## 2020-05-30 ENCOUNTER — Other Ambulatory Visit: Payer: Self-pay

## 2020-05-30 ENCOUNTER — Ambulatory Visit (INDEPENDENT_AMBULATORY_CARE_PROVIDER_SITE_OTHER): Payer: Medicaid Other | Admitting: Internal Medicine

## 2020-05-30 ENCOUNTER — Encounter: Payer: Self-pay | Admitting: Internal Medicine

## 2020-05-30 VITALS — BP 122/92 | HR 108 | Ht 63.0 in | Wt 263.0 lb

## 2020-05-30 DIAGNOSIS — G4733 Obstructive sleep apnea (adult) (pediatric): Secondary | ICD-10-CM | POA: Diagnosis not present

## 2020-05-30 DIAGNOSIS — E782 Mixed hyperlipidemia: Secondary | ICD-10-CM

## 2020-05-30 DIAGNOSIS — I10 Essential (primary) hypertension: Secondary | ICD-10-CM | POA: Diagnosis not present

## 2020-05-30 DIAGNOSIS — Z6841 Body Mass Index (BMI) 40.0 and over, adult: Secondary | ICD-10-CM

## 2020-05-30 DIAGNOSIS — R002 Palpitations: Secondary | ICD-10-CM

## 2020-05-30 NOTE — Patient Instructions (Signed)
Medication Instructions:  No Changes In Medications at this time.  *If you need a refill on your cardiac medications before your next appointment, please call your pharmacy*  Follow-Up: At [facility], you and your health needs are our priority.  As part of our continuing mission to provide you with exceptional heart care, we have created designated Provider Care Teams.  These Care Teams include your primary Cardiologist (physician) and Advanced Practice Providers (APPs -  Physician Assistants and Nurse Practitioners) who all work together to provide you with the care you need, when you need it.  Your next appointment:   6 month(s)  The format for your next appointment:   In Person  Provider:   Cherlynn Kaiser, MD  Other Instructions Mediterranean Diet A Mediterranean diet refers to food and lifestyle choices that are based on the traditions of countries located on the [place]. This way of eating has been shown to help prevent certain conditions and improve outcomes for people who have chronic diseases, like kidney disease and heart disease. What are tips for following this plan? Lifestyle  Cook and eat meals together with your family, when possible.  Drink enough fluid to keep your urine clear or pale yellow.  Be physically active every day. This includes: ? Aerobic exercise like running or swimming. ? Leisure activities like gardening, walking, or housework.  Get 7-8 hours of sleep each night.  If recommended by your health care provider, drink red wine in moderation. This means 1 glass a day for nonpregnant women and 2 glasses a day for men. A glass of wine equals 5 oz (150 mL). Reading food labels  Check the serving size of packaged foods. For foods such as rice and pasta, the serving size refers to the amount of cooked product, not dry.  Check the total fat in packaged foods. Avoid foods that have saturated fat or trans fats.  Check the ingredients list for  added sugars, such as corn syrup.   Shopping  At the grocery store, buy most of your food from the areas near the walls of the store. This includes: ? Fresh fruits and vegetables (produce). ? Grains, beans, nuts, and seeds. Some of these may be available in unpackaged forms or large amounts (in bulk). ? Fresh seafood. ? Poultry and eggs. ? Low-fat dairy products.  Buy whole ingredients instead of prepackaged foods.  Buy fresh fruits and vegetables in-season from local farmers markets.  Buy frozen fruits and vegetables in resealable bags.  If you do not have access to quality fresh seafood, buy precooked frozen shrimp or canned fish, such as tuna, salmon, or sardines.  Buy small amounts of raw or cooked vegetables, salads, or olives from the deli or salad bar at your store.  Stock your pantry so you always have certain foods on hand, such as olive oil, canned tuna, canned tomatoes, rice, pasta, and beans. Cooking  Cook foods with extra-virgin olive oil instead of using butter or other vegetable oils.  Have meat as a side dish, and have vegetables or grains as your main dish. This means having meat in small portions or adding small amounts of meat to foods like pasta or stew.  Use beans or vegetables instead of meat in common dishes like chili or lasagna.  Experiment with different cooking methods. Try roasting or broiling vegetables instead of steaming or sauteing them.  Add frozen vegetables to soups, stews, pasta, or rice.  Add nuts or seeds for added healthy fat at  each meal. You can add these to yogurt, salads, or vegetable dishes.  Marinate fish or vegetables using olive oil, lemon juice, garlic, and fresh herbs. Meal planning  Plan to eat 1 vegetarian meal one day each week. Try to work up to 2 vegetarian meals, if possible.  Eat seafood 2 or more times a week.  Have healthy snacks readily available, such as: ? Vegetable sticks with hummus. ? [place] yogurt. ? Fruit  and nut trail mix.  Eat balanced meals throughout the week. This includes: ? Fruit: 2-3 servings a day ? Vegetables: 4-5 servings a day ? Low-fat dairy: 2 servings a day ? Fish, poultry, or lean meat: 1 serving a day ? Beans and legumes: 2 or more servings a week ? Nuts and seeds: 1-2 servings a day ? Whole grains: 6-8 servings a day ? Extra-virgin olive oil: 3-4 servings a day  Limit red meat and sweets to only a few servings a month   What are my food choices?  Mediterranean diet ? Recommended  Grains: Whole-grain pasta. Brown rice. Bulgar wheat. Polenta. Couscous. Whole-wheat bread. [place].  Vegetables: Artichokes. Beets. Broccoli. Cabbage. Carrots. Eggplant. Green beans. Chard. Kale. Spinach. Onions. Leeks. Peas. Squash. Tomatoes. Peppers. Radishes.  Fruits: Apples. Apricots. Avocado. Berries. Bananas. Cherries. Dates. Figs. Grapes. Lemons. Melon. Oranges. Peaches. Plums. Pomegranate.  Meats and other protein foods: Beans. Almonds. Sunflower seeds. Pine nuts. Peanuts. [place]. Salmon. Scallops. Shrimp. [place]. Tilapia. Clams. Oysters. Eggs.  Dairy: Low-fat milk. Cheese. Greek yogurt.  Beverages: Water. Red wine. Herbal tea.  Fats and oils: Extra virgin olive oil. Avocado oil. Grape seed oil.  Sweets and desserts: [place] yogurt with honey. Baked apples. Poached pears. Trail mix.  Seasoning and other foods: Basil. Cilantro. Coriander. Cumin. Mint. Parsley. Sage. Rosemary. Tarragon. Garlic. Oregano. Thyme. Pepper. Balsalmic vinegar. Tahini. Hummus. Tomato sauce. Olives. Mushrooms. ? Limit these  Grains: Prepackaged pasta or rice dishes. Prepackaged cereal with added sugar.  Vegetables: Deep fried potatoes (french fries).  Fruits: Fruit canned in syrup.  Meats and other protein foods: Beef. Pork. Lamb. Poultry with skin. Hot dogs. [place] Salines.  Dairy: Ice cream. Sour cream. Whole milk.  Beverages: Juice. Sugar-sweetened soft drinks. Beer. Liquor and spirits.  Fats and  oils: Butter. Canola oil. Vegetable oil. Beef fat (tallow). Lard.  Sweets and desserts: Cookies. Cakes. Pies. Candy.  Seasoning and other foods: Mayonnaise. Premade sauces and marinades. The items listed may not be a complete list. Talk with your dietitian about what dietary choices are right for you. Summary  The Mediterranean diet includes both food and lifestyle choices.  Eat a variety of fresh fruits and vegetables, beans, nuts, seeds, and whole grains.  Limit the amount of red meat and sweets that you eat.  Talk with your health care provider about whether it is safe for you to drink red wine in moderation. This means 1 glass a day for nonpregnant women and 2 glasses a day for men. A glass of wine equals 5 oz (150 mL). This information is not intended to replace advice given to you by your health care provider. Make sure you discuss any questions you have with your health care provider. Document Revised: 11/03/2015 Document Reviewed: 10/27/2015 Elsevier Patient Education  2020 [employer].     Why follow it? Research shows. . Those who follow the Mediterranean diet have a reduced risk of heart disease  . The diet is associated with a reduced incidence of Parkinson's and Alzheimer's diseases . People following the diet may  have longer life expectancies and lower rates of chronic diseases  . The Dietary Guidelines for Americans recommends the Mediterranean diet as an eating plan to promote health and prevent disease  What Is the Mediterranean Diet?  . Healthy eating plan based on typical foods and recipes of Mediterranean-style cooking . The diet is primarily a plant based diet; these foods should make up a majority of meals   Starches - Plant based foods should make up a majority of meals - They are an important sources of vitamins, minerals, energy, antioxidants, and fiber - Choose whole grains, foods high in fiber and minimally processed items  - Typical grain sources include  wheat, oats, barley, corn, brown rice, bulgar, farro, millet, polenta, couscous  - Various types of beans include chickpeas, lentils, fava beans, black beans, white beans   Fruits  Veggies - Large quantities of antioxidant rich fruits & veggies; 6 or more servings  - Vegetables can be eaten raw or lightly drizzled with oil and cooked  - Vegetables common to the traditional Mediterranean Diet include: artichokes, arugula, beets, broccoli, brussel sprouts, cabbage, carrots, celery, collard greens, cucumbers, eggplant, kale, leeks, lemons, lettuce, mushrooms, okra, onions, peas, peppers, potatoes, pumpkin, radishes, rutabaga, shallots, spinach, sweet potatoes, turnips, zucchini - Fruits common to the Mediterranean Diet include: apples, apricots, avocados, cherries, clementines, dates, figs, grapefruits, grapes, melons, nectarines, oranges, peaches, pears, pomegranates, strawberries, tangerines  Fats - Replace butter and margarine with healthy oils, such as olive oil, canola oil, and tahini  - Limit nuts to no more than a handful a day  - Nuts include walnuts, almonds, pecans, pistachios, pine nuts  - Limit or avoid candied, honey roasted or heavily salted nuts - Olives are central to the [employer] - can be eaten whole or used in a variety of dishes   Meats Protein - Limiting red meat: no more than a few times a month - When eating red meat: choose lean cuts and keep the portion to the size of deck of cards - Eggs: approx. 0 to 4 times a week  - Fish and lean poultry: at least 2 a week  - Healthy protein sources include, chicken, [place], lean beef, lamb - Increase intake of seafood such as tuna, salmon, trout, mackerel, shrimp, scallops - Avoid or limit high fat processed meats such as sausage and bacon  Dairy - Include moderate amounts of low fat dairy products  - Focus on healthy dairy such as fat free yogurt, skim milk, low or reduced fat cheese - Limit dairy products higher in fat such  as whole or 2% milk, cheese, ice cream  Alcohol - Moderate amounts of red wine is ok  - No more than 5 oz daily for women (all ages) and men older than age 100  - No more than 10 oz of wine daily for men younger than 35  Other - Limit sweets and other desserts  - Use herbs and spices instead of salt to flavor foods  - Herbs and spices common to the traditional Mediterranean Diet include: basil, bay leaves, chives, cloves, cumin, fennel, garlic, lavender, marjoram, mint, oregano, parsley, pepper, rosemary, sage, savory, sumac, tarragon, thyme   It's not just a diet, it's a lifestyle:  . The Mediterranean diet includes lifestyle factors typical of those in the region  . Foods, drinks and meals are best eaten with others and savored . Daily physical activity is important for overall good health . This could be strenuous exercise like running  and aerobics . This could also be more leisurely activities such as walking, housework, yard-work, or taking the stairs . Moderation is the key; a balanced and healthy diet accommodates most foods and drinks . Consider portion sizes and frequency of consumption of certain foods   Meal Ideas & Options:  . Breakfast:  o Whole wheat toast or whole wheat English muffins with peanut butter & hard boiled egg o Steel cut oats topped with apples & cinnamon and skim milk  o Fresh fruit: banana, strawberries, melon, berries, peaches  o Smoothies: strawberries, bananas, greek yogurt, peanut butter o Low fat greek yogurt with blueberries and granola  o Egg white omelet with spinach and mushrooms o Breakfast couscous: whole wheat couscous, apricots, skim milk, cranberries  . Sandwiches:  o Hummus and grilled vegetables (peppers, zucchini, squash) on whole wheat bread   o Grilled chicken on whole wheat pita with lettuce, tomatoes, cucumbers or tzatziki  o Tuna salad on whole wheat bread: tuna salad made with greek yogurt, olives, red peppers, capers, green  onions o Garlic rosemary lamb pita: lamb sauted with garlic, rosemary, salt & pepper; add lettuce, cucumber, greek yogurt to pita - flavor with lemon juice and black pepper  . Seafood:  o Mediterranean grilled salmon, seasoned with garlic, basil, parsley, lemon juice and black pepper o Shrimp, lemon, and spinach whole-grain pasta salad made with low fat greek yogurt  o Seared scallops with lemon orzo  o Seared tuna steaks seasoned salt, pepper, coriander topped with tomato mixture of olives, tomatoes, olive oil, minced garlic, parsley, green onions and cappers  . Meats:  o Herbed greek chicken salad with kalamata olives, cucumber, feta  o Red bell peppers stuffed with spinach, bulgur, lean ground beef (or lentils) & topped with feta   o Kebabs: skewers of chicken, tomatoes, onions, zucchini, squash  o [place] burgers: made with red onions, mint, dill, lemon juice, feta cheese topped with roasted red peppers . Vegetarian o Cucumber salad: cucumbers, artichoke hearts, celery, red onion, feta cheese, tossed in olive oil & lemon juice  o Hummus and whole grain pita points with a greek salad (lettuce, tomato, feta, olives, cucumbers, red onion) o Lentil soup with celery, carrots made with vegetable broth, garlic, salt and pepper  o Tabouli salad: parsley, bulgur, mint, scallions, cucumbers, tomato, radishes, lemon juice, olive oil, salt and pepper.

## 2020-05-30 NOTE — Progress Notes (Signed)
Cardiology Office Note:    Date:  05/30/2020   ID:  Sheryl Cox, DOB 02-08-1971, MRN [id number]  PCP:  Lucianne Lei, MD  Cardiologist:  Elouise Munroe, MD  Electrophysiologist:  None   Referring MD: Lucianne Lei, MD   Chief Complaint/Reason for Referral: Palpitations, HTN  History of Present Illness:    Sheryl Cox is a 50 y.o. female with a history of asthma, breast cancer currently on tamoxifen, anemia, diabetes mellitus, migraine, hypertension.  She presents today for follow up of palpitations, HTN.   Gaining weight, unclear to her if this is fluid weight versus weight gain.  She has not been able to be as active as she would like to be, and since June of last year she has gained approximately 10 pounds per our records.  She is otherwise feeling well however.  We discussed in detail today exercise recommendations and healthy eating habits.  I recommend a Mediterranean diet.  She is also going to try to use her air Rolly Salter, prep meals, and I have suggested switching from canned gravy with her dinners to possibly [place] yogurt dressing.  The patient denies chest pain, chest pressure, dyspnea at rest or with exertion, palpitations, PND, orthopnea, or leg swelling. Denies cough, fever, chills. Denies nausea, vomiting. Denies syncope or presyncope. Denies dizziness or lightheadedness.  Past Medical History:  Diagnosis Date  . Anemia    history of blood transfusion-no abnormal reaction noted  . Asthma    Albuterol as needed  . 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 Diabetes mellitus without complication ([place])    takes Metformin daily  . History of migraine    several yrs ago  . Hypertension    takes Tribenzor daily    Past Surgical History:  Procedure Laterality Date  . BIOPSY BREAST Left 12/05/12   fibroadenoma  . BREAST LUMPECTOMY     2014  . BREAST SURGERY Right   . CESAREAN SECTION  01/05/2009  . CHOLECYSTECTOMY N/A 01/31/2016    Procedure: LAPAROSCOPIC CHOLECYSTECTOMY;  Surgeon: Erroll Luna, MD;  Location: [facility];  Service: General;  Laterality: N/A;  . DILITATION & CURRETTAGE/HYSTROSCOPY WITH HYDROTHERMAL ABLATION N/A 08/12/2013   Procedure: DILATATION & CURETTAGE/HYSTEROSCOPY WITH HYDROTHERMAL ABLATION;  Surgeon: Frederico Hamman, MD;  Location: [facility];  Service: Gynecology;  Laterality: N/A;  . ROBOTIC ASSISTED TOTAL HYSTERECTOMY N/A 06/18/2017   Procedure: XI ROBOTIC ASSISTED TOTAL HYSTERECTOMY;  Surgeon: Lavonia Drafts, MD;  Location: [facility];  Service: Gynecology;  Laterality: N/A;  . SALPINGOOPHORECTOMY Bilateral 06/18/2017   Procedure: BILATERAL SALPINGO OOPHORECTOMY;  Surgeon: Lavonia Drafts, MD;  Location: [facility];  Service: Gynecology;  Laterality: Bilateral;    Current Medications: Current Meds  Medication Sig  . acetaminophen (TYLENOL) 325 MG tablet Take 650 mg by mouth daily as needed for headache.  . albuterol (VENTOLIN HFA) 108 (90 Base) MCG/ACT inhaler Inhale 1-2 puffs into the lungs every 6 (six) hours as needed for wheezing or shortness of breath.  . ALPRAZolam (XANAX) 0.25 MG tablet TAKE 1 TABLET BY MOUTH TWICE A DAY AS NEEDED FOR ANXIETY  . amLODipine (NORVASC) 10 MG tablet Take 10 mg by mouth daily.  . Ascorbic Acid (VITAMIN C PO) Take by mouth.  . benzonatate (TESSALON) 100 MG capsule Take 1 capsule (100 mg total) by mouth every 8 (eight) hours.  . celecoxib (CELEBREX) 200 MG capsule Take 1 capsule (200 mg total) by mouth 2 (two) times daily.  . cyclobenzaprine (FLEXERIL)  10 MG tablet Take 0.5-1 tablets (5-10 mg total) by mouth 2 (two) times daily as needed for muscle spasms.  Marland Kitchen ELDERBERRY PO Take by mouth.  . fluticasone (FLONASE) 50 MCG/ACT nasal spray Place 2 sprays into both nostrils daily.  . hydrochlorothiazide (HYDRODIURIL) 12.5 MG tablet Take 2 tablets (25 mg total) by mouth daily. (Patient taking differently: Take 12.5 mg by mouth daily.)  . meloxicam (MOBIC) 7.5 MG  tablet Take 1 tablet (7.5 mg total) by mouth daily.  . metFORMIN (GLUCOPHAGE) 500 MG tablet Take 1 tablet (500 mg total) by mouth 2 (two) times daily with a meal. (Patient taking differently: Take 500 mg by mouth daily.)  . Multiple Vitamins-Minerals (MULTIVITAMIN PO) Take 1 tablet by mouth daily.  . ondansetron (ZOFRAN) 4 MG tablet Take 1 tablet (4 mg total) by mouth every 6 (six) hours.  . pantoprazole (PROTONIX) 20 MG tablet TAKE 1 TABLET BY MOUTH EVERY DAY  . promethazine-dextromethorphan (PROMETHAZINE-DM) 6.25-15 MG/5ML syrup Take 5 mLs by mouth 4 (four) times daily as needed for cough.  . Semaglutide (RYBELSUS) 7 MG TABS Take by mouth.  . tamoxifen (NOLVADEX) 20 MG tablet Take 1 tablet (20 mg total) by mouth daily.  Marland Kitchen tiZANidine (ZANAFLEX) 4 MG tablet Take 1 tablet (4 mg total) by mouth at bedtime.  . traMADol (ULTRAM) 50 MG tablet SMARTSIG:1 Tablet(s) By Mouth Every 12 Hours PRN  . Vitamin D, Ergocalciferol, (DRISDOL) 1.25 MG (50000 UNIT) CAPS capsule Take 50,000 Units by mouth once a week.     Allergies:   Shrimp [shellfish allergy] and Aleve [naproxen]   Social History   Tobacco Use  . Smoking status: Never Smoker  . Smokeless tobacco: Never Used  Vaping Use  . Vaping Use: Never used  Substance Use Topics  . Alcohol use: Yes    Alcohol/week: 1.0 standard drink    Types: 1 Cans of beer per week    Comment: occa  . Drug use: No     Family History: The patient's family history includes Breast cancer in her paternal aunt, paternal grandmother, and another family member; COPD in her mother; Diabetes in her mother; Hypertension in her mother; Prostate cancer in her paternal grandfather and paternal uncle; Prostate cancer (age of onset: 11) in her father. There is no history of Anesthesia problems, Amblyopia, Blindness, Cataracts, Glaucoma, Macular degeneration, Retinal detachment, Strabismus, or Retinitis pigmentosa.  ROS:   Please see the history of present illness.    All  other systems reviewed and are negative.  EKGs/Labs/Other Studies Reviewed:    The following studies were reviewed today:  EKG:  Sinus tachycardia, rate 108. Anterior infarct pattern is unchanged from prior.    Recent Labs: 08/04/2019: ALT 16; BUN 19; Creatinine, Ser 0.85; Hemoglobin 11.5; Platelets 349; Potassium 4.0; Sodium 139  Recent Lipid Panel    Component Value Date/Time   CHOL 161 05/08/2018 1216   TRIG 207 (H) 05/08/2018 1216   HDL 30 (L) 05/08/2018 1216   CHOLHDL 5.4 (H) 05/08/2018 1216   LDLCALC 90 05/08/2018 1216    Physical Exam:    VS:  BP (!) 122/92   Pulse (!) 108   Ht 5\' 3"  (1.6 m)   Wt 263 lb (119.3 kg)   LMP 05/27/2017 (Approximate)   SpO2 97%   BMI 46.59 kg/m     Wt Readings from Last 5 Encounters:  05/30/20 263 lb (119.3 kg)  12/01/19 258 lb 6.4 oz (117.2 kg)  11/02/19 251 lb 6.4 oz (114 kg)  10/20/19 250 lb (113.4 kg)  09/21/19 245 lb (111.1 kg)    Constitutional: No acute distress Eyes: sclera non-icteric, normal conjunctiva and lids ENMT: normal dentition, moist mucous membranes Cardiovascular: regular rhythm, tachycardic rate, no murmurs. S1 and S2 normal. Radial pulses normal bilaterally. No jugular venous distention.  Respiratory: clear to auscultation bilaterally GI : normal bowel sounds, soft and nontender. No distention.   MSK: extremities warm, well perfused. No edema.  NEURO: grossly nonfocal exam, moves all extremities. PSYCH: alert and oriented x 3, normal mood and affect.   ASSESSMENT:    1. Essential hypertension   2. Palpitations   3. Class 3 severe obesity with body mass index (BMI) of 45.0 to 49.9 in adult, unspecified obesity type, unspecified whether serious comorbidity present ([place])   4. Obstructive sleep apnea (adult) (pediatric)   5. Mixed hyperlipidemia    PLAN:    Essential hypertension - Plan: EKG 12-Lead -Blood pressure with overall good control today.  Diastolic blood pressure is mildly elevated.  She is going  to focus on diet lifestyle changes which may help with this prior to adding further pharmacologic therapy.  Palpitations-occasional palpitations.  Low risk monitor performed 1 year ago.  Class 3 severe obesity with body mass index (BMI) of 45.0 to 49.9 in adult, unspecified obesity type, unspecified whether serious comorbidity present ([place]) -We discussed in detail today diet lifestyle modifications.  I have given her specific recommendations for meal prep and Mediterranean diet.  She will try these and we will reassess in 6 months.  Obstructive sleep apnea (adult)  -Encourage compliance with CPAP  Hyperlipidemia-last lipid panel obtained 1 year ago shows LDL of 100 and triglycerides 122.  LDL is above goal, we participated in shared decision making and she is going to do diet lifestyle modifications first, at her next physical she will have her lipids checked again and we will review these at follow-up.  Total time of encounter: 35 minutes total time of encounter, including 25 minutes spent in face-to-face patient care on the date of this encounter. This time includes coordination of care and counseling regarding above mentioned problem list. Remainder of non-face-to-face time involved reviewing chart documents/testing relevant to the patient encounter and documentation in the medical record. I have independently reviewed documentation from referring provider.   Cherlynn Kaiser, MD, [facility]    Medication Adjustments/Labs and Tests Ordered: Current medicines are reviewed at length with the patient today.  Concerns regarding medicines are outlined above.   Orders Placed This Encounter  Procedures  . EKG 12-Lead      No orders of the defined types were placed in this encounter.   Patient Instructions   Medication Instructions:  No Changes In Medications at this time.  *If you need a refill on your cardiac medications before your next appointment, please call  your pharmacy*  Follow-Up: At [facility], you and your health needs are our priority.  As part of our continuing mission to provide you with exceptional heart care, we have created designated Provider Care Teams.  These Care Teams include your primary Cardiologist (physician) and Advanced Practice Providers (APPs -  Physician Assistants and Nurse Practitioners) who all work together to provide you with the care you need, when you need it.  Your next appointment:   6 month(s)  The format for your next appointment:   In Person  Provider:   Cherlynn Kaiser, MD  Other Instructions Mediterranean Diet A Mediterranean diet refers to food  and lifestyle choices that are based on the traditions of countries located on the [employer]. This way of eating has been shown to help prevent certain conditions and improve outcomes for people who have chronic diseases, like kidney disease and heart disease. What are tips for following this plan? Lifestyle  Cook and eat meals together with your family, when possible.  Drink enough fluid to keep your urine clear or pale yellow.  Be physically active every day. This includes: ? Aerobic exercise like running or swimming. ? Leisure activities like gardening, walking, or housework.  Get 7-8 hours of sleep each night.  If recommended by your health care provider, drink red wine in moderation. This means 1 glass a day for nonpregnant women and 2 glasses a day for men. A glass of wine equals 5 oz (150 mL). Reading food labels  Check the serving size of packaged foods. For foods such as rice and pasta, the serving size refers to the amount of cooked product, not dry.  Check the total fat in packaged foods. Avoid foods that have saturated fat or trans fats.  Check the ingredients list for added sugars, such as corn syrup.   Shopping  At the grocery store, buy most of your food from the areas near the walls of the store. This includes: ? Fresh  fruits and vegetables (produce). ? Grains, beans, nuts, and seeds. Some of these may be available in unpackaged forms or large amounts (in bulk). ? Fresh seafood. ? Poultry and eggs. ? Low-fat dairy products.  Buy whole ingredients instead of prepackaged foods.  Buy fresh fruits and vegetables in-season from local farmers markets.  Buy frozen fruits and vegetables in resealable bags.  If you do not have access to quality fresh seafood, buy precooked frozen shrimp or canned fish, such as tuna, salmon, or sardines.  Buy small amounts of raw or cooked vegetables, salads, or olives from the deli or salad bar at your store.  Stock your pantry so you always have certain foods on hand, such as olive oil, canned tuna, canned tomatoes, rice, pasta, and beans. Cooking  Cook foods with extra-virgin olive oil instead of using butter or other vegetable oils.  Have meat as a side dish, and have vegetables or grains as your main dish. This means having meat in small portions or adding small amounts of meat to foods like pasta or stew.  Use beans or vegetables instead of meat in common dishes like chili or lasagna.  Experiment with different cooking methods. Try roasting or broiling vegetables instead of steaming or sauteing them.  Add frozen vegetables to soups, stews, pasta, or rice.  Add nuts or seeds for added healthy fat at each meal. You can add these to yogurt, salads, or vegetable dishes.  Marinate fish or vegetables using olive oil, lemon juice, garlic, and fresh herbs. Meal planning  Plan to eat 1 vegetarian meal one day each week. Try to work up to 2 vegetarian meals, if possible.  Eat seafood 2 or more times a week.  Have healthy snacks readily available, such as: ? Vegetable sticks with hummus. ? [place] yogurt. ? Fruit and nut trail mix.  Eat balanced meals throughout the week. This includes: ? Fruit: 2-3 servings a day ? Vegetables: 4-5 servings a day ? Low-fat dairy: 2  servings a day ? Fish, poultry, or lean meat: 1 serving a day ? Beans and legumes: 2 or more servings a week ? Nuts and seeds: 1-2 servings a  day ? Whole grains: 6-8 servings a day ? Extra-virgin olive oil: 3-4 servings a day  Limit red meat and sweets to only a few servings a month   What are my food choices?  Mediterranean diet ? Recommended  Grains: Whole-grain pasta. Brown rice. Bulgar wheat. Polenta. Couscous. Whole-wheat bread. [place].  Vegetables: Artichokes. Beets. Broccoli. Cabbage. Carrots. Eggplant. Green beans. Chard. Kale. Spinach. Onions. Leeks. Peas. Squash. Tomatoes. Peppers. Radishes.  Fruits: Apples. Apricots. Avocado. Berries. Bananas. Cherries. Dates. Figs. Grapes. Lemons. Melon. Oranges. Peaches. Plums. Pomegranate.  Meats and other protein foods: Beans. Almonds. Sunflower seeds. Pine nuts. Peanuts. [place]. Salmon. Scallops. Shrimp. Verdel. Tilapia. Clams. Oysters. Eggs.  Dairy: Low-fat milk. Cheese. Greek yogurt.  Beverages: Water. Red wine. Herbal tea.  Fats and oils: Extra virgin olive oil. Avocado oil. Grape seed oil.  Sweets and desserts: [place] yogurt with honey. Baked apples. Poached pears. Trail mix.  Seasoning and other foods: Basil. Cilantro. Coriander. Cumin. Mint. Parsley. Sage. Rosemary. Tarragon. Garlic. Oregano. Thyme. Pepper. Balsalmic vinegar. Tahini. Hummus. Tomato sauce. Olives. Mushrooms. ? Limit these  Grains: Prepackaged pasta or rice dishes. Prepackaged cereal with added sugar.  Vegetables: Deep fried potatoes (french fries).  Fruits: Fruit canned in syrup.  Meats and other protein foods: Beef. Pork. Lamb. Poultry with skin. Hot dogs. Berniece Salines.  Dairy: Ice cream. Sour cream. Whole milk.  Beverages: Juice. Sugar-sweetened soft drinks. Beer. Liquor and spirits.  Fats and oils: Butter. Canola oil. Vegetable oil. Beef fat (tallow). Lard.  Sweets and desserts: Cookies. Cakes. Pies. Candy.  Seasoning and other foods: Mayonnaise.  Premade sauces and marinades. The items listed may not be a complete list. Talk with your dietitian about what dietary choices are right for you. Summary  The Mediterranean diet includes both food and lifestyle choices.  Eat a variety of fresh fruits and vegetables, beans, nuts, seeds, and whole grains.  Limit the amount of red meat and sweets that you eat.  Talk with your health care provider about whether it is safe for you to drink red wine in moderation. This means 1 glass a day for nonpregnant women and 2 glasses a day for men. A glass of wine equals 5 oz (150 mL). This information is not intended to replace advice given to you by your health care provider. Make sure you discuss any questions you have with your health care provider. Document Revised: 11/03/2015 Document Reviewed: 10/27/2015 Elsevier Patient Education  2020 [employer].     Why follow it? Research shows. . Those who follow the Mediterranean diet have a reduced risk of heart disease  . The diet is associated with a reduced incidence of Parkinson's and Alzheimer's diseases . People following the diet may have longer life expectancies and lower rates of chronic diseases  . The Dietary Guidelines for Americans recommends the Mediterranean diet as an eating plan to promote health and prevent disease  What Is the Mediterranean Diet?  . Healthy eating plan based on typical foods and recipes of Mediterranean-style cooking . The diet is primarily a plant based diet; these foods should make up a majority of meals   Starches - Plant based foods should make up a majority of meals - They are an important sources of vitamins, minerals, energy, antioxidants, and fiber - Choose whole grains, foods high in fiber and minimally processed items  - Typical grain sources include wheat, oats, barley, corn, brown rice, bulgar, farro, millet, polenta, couscous  - Various types of beans include chickpeas, lentils, fava beans,  black beans,  white beans   Fruits  Veggies - Large quantities of antioxidant rich fruits & veggies; 6 or more servings  - Vegetables can be eaten raw or lightly drizzled with oil and cooked  - Vegetables common to the traditional Mediterranean Diet include: artichokes, arugula, beets, broccoli, brussel sprouts, cabbage, carrots, celery, collard greens, cucumbers, eggplant, kale, leeks, lemons, lettuce, mushrooms, okra, onions, peas, peppers, potatoes, pumpkin, radishes, rutabaga, shallots, spinach, sweet potatoes, turnips, zucchini - Fruits common to the Mediterranean Diet include: apples, apricots, avocados, cherries, clementines, dates, figs, grapefruits, grapes, melons, nectarines, oranges, peaches, pears, pomegranates, strawberries, tangerines  Fats - Replace butter and margarine with healthy oils, such as olive oil, canola oil, and tahini  - Limit nuts to no more than a handful a day  - Nuts include walnuts, almonds, pecans, pistachios, pine nuts  - Limit or avoid candied, honey roasted or heavily salted nuts - Olives are central to the [employer] - can be eaten whole or used in a variety of dishes   Meats Protein - Limiting red meat: no more than a few times a month - When eating red meat: choose lean cuts and keep the portion to the size of deck of cards - Eggs: approx. 0 to 4 times a week  - Fish and lean poultry: at least 2 a week  - Healthy protein sources include, chicken, [place], lean beef, lamb - Increase intake of seafood such as tuna, salmon, trout, mackerel, shrimp, scallops - Avoid or limit high fat processed meats such as sausage and bacon  Dairy - Include moderate amounts of low fat dairy products  - Focus on healthy dairy such as fat free yogurt, skim milk, low or reduced fat cheese - Limit dairy products higher in fat such as whole or 2% milk, cheese, ice cream  Alcohol - Moderate amounts of red wine is ok  - No more than 5 oz daily for women (all ages) and men older than age 51   - No more than 10 oz of wine daily for men younger than 64  Other - Limit sweets and other desserts  - Use herbs and spices instead of salt to flavor foods  - Herbs and spices common to the traditional Mediterranean Diet include: basil, bay leaves, chives, cloves, cumin, fennel, garlic, lavender, marjoram, mint, oregano, parsley, pepper, rosemary, sage, savory, sumac, tarragon, thyme   It's not just a diet, it's a lifestyle:  . The Mediterranean diet includes lifestyle factors typical of those in the region  . Foods, drinks and meals are best eaten with others and savored . Daily physical activity is important for overall good health . This could be strenuous exercise like running and aerobics . This could also be more leisurely activities such as walking, housework, yard-work, or taking the stairs . Moderation is the key; a balanced and healthy diet accommodates most foods and drinks . Consider portion sizes and frequency of consumption of certain foods   Meal Ideas & Options:  . Breakfast:  o Whole wheat toast or whole wheat English muffins with peanut butter & hard boiled egg o Steel cut oats topped with apples & cinnamon and skim milk  o Fresh fruit: banana, strawberries, melon, berries, peaches  o Smoothies: strawberries, bananas, greek yogurt, peanut butter o Low fat greek yogurt with blueberries and granola  o Egg white omelet with spinach and mushrooms o Breakfast couscous: whole wheat couscous, apricots, skim milk, cranberries  . Sandwiches:  o Hummus and  grilled vegetables (peppers, zucchini, squash) on whole wheat bread   o Grilled chicken on whole wheat pita with lettuce, tomatoes, cucumbers or tzatziki  o Tuna salad on whole wheat bread: tuna salad made with greek yogurt, olives, red peppers, capers, green onions o Garlic rosemary lamb pita: lamb sauted with garlic, rosemary, salt & pepper; add lettuce, cucumber, greek yogurt to pita - flavor with lemon juice and black  pepper  . Seafood:  o Mediterranean grilled salmon, seasoned with garlic, basil, parsley, lemon juice and black pepper o Shrimp, lemon, and spinach whole-grain pasta salad made with low fat greek yogurt  o Seared scallops with lemon orzo  o Seared tuna steaks seasoned salt, pepper, coriander topped with tomato mixture of olives, tomatoes, olive oil, minced garlic, parsley, green onions and cappers  . Meats:  o Herbed greek chicken salad with kalamata olives, cucumber, feta  o Red bell peppers stuffed with spinach, bulgur, lean ground beef (or lentils) & topped with feta   o Kebabs: skewers of chicken, tomatoes, onions, zucchini, squash  o [place] burgers: made with red onions, mint, dill, lemon juice, feta cheese topped with roasted red peppers . Vegetarian o Cucumber salad: cucumbers, artichoke hearts, celery, red onion, feta cheese, tossed in olive oil & lemon juice  o Hummus and whole grain pita points with a greek salad (lettuce, tomato, feta, olives, cucumbers, red onion) o Lentil soup with celery, carrots made with vegetable broth, garlic, salt and pepper  o Tabouli salad: parsley, bulgur, mint, scallions, cucumbers, tomato, radishes, lemon juice, olive oil, salt and pepper.

## 2020-05-31 ENCOUNTER — Telehealth: Payer: Self-pay | Admitting: Licensed Clinical Social Worker

## 2020-05-31 NOTE — Telephone Encounter (Signed)
Received referral from Belinda Block, RN w/ Dr. Margaretann Loveless inquiring about gym membership/exercise program. I have passed this referral on to exercise physiologist Kristen.   Westley Hummer, MSW, [place]  [phone]

## 2020-06-14 ENCOUNTER — Telehealth: Payer: Self-pay | Admitting: *Deleted

## 2020-06-14 DIAGNOSIS — I10 Essential (primary) hypertension: Secondary | ICD-10-CM

## 2020-06-14 DIAGNOSIS — E66813 Obesity, class 3: Secondary | ICD-10-CM

## 2020-06-14 DIAGNOSIS — Z6841 Body Mass Index (BMI) 40.0 and over, adult: Secondary | ICD-10-CM

## 2020-06-14 NOTE — Addendum Note (Signed)
Addended by: Rexanne Mano B on: 06/14/2020 03:45 PM   Modules accepted: Orders

## 2020-06-14 NOTE — Telephone Encounter (Signed)
Sheryl Cox, could you please place referral? Rip Harbour is cc'ed for help.

## 2020-06-14 NOTE — Telephone Encounter (Signed)
Email sent to Zacarias Pontes and [employer]

## 2020-06-14 NOTE — Telephone Encounter (Signed)
Referral to PREP exercise program has been placed.

## 2020-06-14 NOTE — Telephone Encounter (Signed)
Patient referred to Clinical Exercise Physiologist by CSW of [facility] for guidance and discussion about safe home exercises and/or starting an exercise program. Exercise programs and options were discussed in detail. Patient expressed interest in a structured exercise program, although she has been a member of [employer] in the past. She would like to increase her physical activity to enhance her weight loss, while tuning up her diet/nutrition knowledge. We both agreed the 12-week PREP program at the [facility] sounds like the best fit. Will reach out to cardiologist for referral. Patient was provided with my contact information, verbally and via email as a resource to bridge her into an exercise program.     Landis Martins, MS, ACSM, [facility] Clinical Exercise Physiologist/ Health and Wellness Coach

## 2020-07-26 ENCOUNTER — Telehealth: Payer: Self-pay

## 2020-07-26 NOTE — Telephone Encounter (Signed)
Call to pt reference next PREP class starting on 08/09/20 T/TH 615p-730p at [facility] ([place])  Confirmed interest Intake scheduled for 08/03/20 at 6pm Text above info to pt per request

## 2020-07-30 ENCOUNTER — Emergency Department (HOSPITAL_COMMUNITY)
Admission: EM | Admit: 2020-07-30 | Discharge: 2020-07-30 | Disposition: A | Discharge status: Home / Self Care | Payer: Medicaid Other | Attending: Emergency Medicine | Admitting: Emergency Medicine

## 2020-07-30 ENCOUNTER — Emergency Department (HOSPITAL_COMMUNITY): Payer: Medicaid Other

## 2020-07-30 DIAGNOSIS — J45909 Unspecified asthma, uncomplicated: Secondary | ICD-10-CM | POA: Insufficient documentation

## 2020-07-30 DIAGNOSIS — E119 Type 2 diabetes mellitus without complications: Secondary | ICD-10-CM | POA: Insufficient documentation

## 2020-07-30 DIAGNOSIS — I10 Essential (primary) hypertension: Secondary | ICD-10-CM | POA: Diagnosis not present

## 2020-07-30 DIAGNOSIS — S7002XA Contusion of left hip, initial encounter: Secondary | ICD-10-CM | POA: Diagnosis not present

## 2020-07-30 DIAGNOSIS — Z7984 Long term (current) use of oral hypoglycemic drugs: Secondary | ICD-10-CM | POA: Insufficient documentation

## 2020-07-30 DIAGNOSIS — Z79899 Other long term (current) drug therapy: Secondary | ICD-10-CM | POA: Diagnosis not present

## 2020-07-30 DIAGNOSIS — Z853 Personal history of malignant neoplasm of breast: Secondary | ICD-10-CM | POA: Insufficient documentation

## 2020-07-30 DIAGNOSIS — Y92414 Local residential or business street as the place of occurrence of the external cause: Secondary | ICD-10-CM | POA: Insufficient documentation

## 2020-07-30 DIAGNOSIS — S79912A Unspecified injury of left hip, initial encounter: Secondary | ICD-10-CM | POA: Diagnosis present

## 2020-07-30 MED ORDER — OXYCODONE-ACETAMINOPHEN 5-325 MG PO TABS
1.0000 | ORAL_TABLET | Freq: Once | ORAL | Status: AC
  Administered 2020-07-30: 1 via ORAL
  Filled 2020-07-30: qty 1

## 2020-07-30 MED ORDER — DICLOFENAC SODIUM 1 % EX GEL: 4.0000 g | Freq: Four times a day (QID) | CUTANEOUS | 0 refills | Status: DC

## 2020-07-30 MED ORDER — METHOCARBAMOL 500 MG PO TABS: 500.0000 mg | ORAL_TABLET | Freq: Two times a day (BID) | ORAL | 0 refills | Status: DC
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
## 2020-08-03 ENCOUNTER — Other Ambulatory Visit: Payer: Self-pay | Admitting: Nurse Practitioner

## 2020-08-03 ENCOUNTER — Ambulatory Visit
Admission: RE | Admit: 2020-08-03 | Discharge: 2020-08-03 | Disposition: A | Discharge status: Home / Self Care | Payer: Medicaid Other | Source: Ambulatory Visit | Attending: Nurse Practitioner | Admitting: Nurse Practitioner

## 2020-08-03 DIAGNOSIS — M542 Cervicalgia: Secondary | ICD-10-CM

## 2020-08-04 ENCOUNTER — Other Ambulatory Visit: Payer: Self-pay

## 2020-08-04 ENCOUNTER — Ambulatory Visit (INDEPENDENT_AMBULATORY_CARE_PROVIDER_SITE_OTHER): Payer: Medicaid Other

## 2020-08-04 ENCOUNTER — Encounter (HOSPITAL_COMMUNITY): Payer: Self-pay

## 2020-08-04 ENCOUNTER — Ambulatory Visit (HOSPITAL_COMMUNITY)
Admission: EM | Admit: 2020-08-04 | Discharge: 2020-08-04 | Disposition: A | Discharge status: Home / Self Care | Payer: Medicaid Other | Attending: Internal Medicine | Admitting: Internal Medicine

## 2020-08-04 DIAGNOSIS — M25562 Pain in left knee: Secondary | ICD-10-CM

## 2020-08-04 DIAGNOSIS — S8002XD Contusion of left knee, subsequent encounter: Secondary | ICD-10-CM | POA: Diagnosis not present
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
## 2020-08-04 NOTE — Discharge Instructions (Addendum)
As we discussed your x-ray of your knee was negative today.  In addition, your x-rays from previous visits were also negative.  You are likely to be sore for the next week or so.  Continue to take medication as prescribed in emergency department.  Warm soaks and gentle stretching may also be helpful.
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
## 2020-09-08 NOTE — Progress Notes (Signed)
Call from patient Has appts today that would interfere with coming to 1p-215pm class Transport cancelled Pt returned call saying she had days confused will be able to do 1pm-215pm class afterall. Will start her on 7/30pm

## 2020-09-12 ENCOUNTER — Inpatient Hospital Stay: Payer: Medicaid Other | Attending: Hematology and Oncology | Admitting: Hematology and Oncology

## 2020-09-12 NOTE — Assessment & Plan Note (Deleted)
Right breast DCIS ER/PR positive status post lumpectomy at [facility] currently on tamoxifen 10 mg daily March 2015  Tamoxifen Toxicities: Denies side effects of tamoxifen therapy. Patient wishes to stay on antiestrogen therapy with tamoxifen for 10 years even though data for DCIS is only up to 5. Patient understands risks and benefits.  Hypertension: On blood pressure medication. Follows with her primary care physician. 2019total hysterectomy with bilateral salpingo-oophorectomy.  Surve[facility] Benign.   Breast exam  09/12/2020: Benign  Return to clinic in 1 year for follow-up

## 2020-09-13 NOTE — Progress Notes (Signed)
[facility] PREP Weekly Session   Patient Details  Name: Sheryl Cox MRN: [id number] Date of Birth: 1970/04/30 Age: 50 y.o. PCP: Lucianne Lei, MD  There were no vitals filed for this visit.   [name] Weekly seesion - 09/13/20 1500       Weekly Session   Topic Discussed Importance of resistance training;Other ways to be active    Classes attended to date 1            Class at [facility] 09/13/2020, 3:16 PM

## 2020-09-20 NOTE — Progress Notes (Signed)
[facility] PREP Weekly Session   Patient Details  Name: Sheryl Cox MRN: [id number] Date of Birth: May 21, 1970 Age: 49 y.o. PCP: Lucianne Lei, MD  There were no vitals filed for this visit.   [name] Weekly seesion - 09/20/20 1500       Weekly Session   Topic Discussed Healthy eating tips    Minutes exercised this week --   none charted but worked out + 30 min last week   Classes attended to date 3            Class held at [facility] Additional topic: keep added sugar below 24 g per day   Barnett Hatter 09/20/2020, 3:27 PM

## 2020-09-27 NOTE — Progress Notes (Signed)
[facility] PREP Weekly Session   Patient Details  Name: Sheryl Cox MRN: [id number] Date of Birth: 30-Nov-1970 Age: 50 y.o. PCP: Lucianne Lei, MD  There were no vitals filed for this visit.   [name] Weekly seesion - 09/27/20 1500       Weekly Session   Topic Discussed Health habits;Water    Minutes exercised this week --   not charted   Classes attended to date 5            Class at [facility] C/O soreness in legs today even after taking meloxicam-advised to notify MD to discuss and avoid exercises that aggravate   Barnett Hatter 09/27/2020, 3:07 PM

## 2020-10-04 NOTE — Progress Notes (Signed)
[facility] PREP Weekly Session   Patient Details  Name: Sheryl Cox MRN: [id number] Date of Birth: 09-05-1970 Age: 50 y.o. PCP: Lucianne Lei, MD  There were no vitals filed for this visit.   [name] Weekly seesion - 10/04/20 1500       Weekly Session   Topic Discussed Restaurant Eating   sugar demo   Minutes exercised this week --   not charted-explained process for tracking   Classes attended to date 6            Class held at [facility] 10/04/2020, 3:55 PM

## 2020-10-11 NOTE — Progress Notes (Signed)
[facility] PREP Weekly Session   Patient Details  Name: Sheryl Cox MRN: [id number] Date of Birth: 02/27/1971 Age: 50 y.o. PCP: Lucianne Lei, MD  Vitals:   10/11/20 1538  Weight: 267 lb (121.1 kg)     [name] Weekly seesion - 10/11/20 1500       Weekly Session   Topic Discussed Stress management and problem solving   Meditation, chair yoga   Minutes exercised this week --   not charted   Classes attended to date 8            Class held at [facility] 10/11/2020, 3:39 PM

## 2020-10-18 NOTE — Progress Notes (Signed)
[facility] PREP Weekly Session   Patient Details  Name: Sheryl Cox MRN: [id number] Date of Birth: [birth year] Age: 50 y.o. PCP: Lucianne Lei, MD  Vitals:   10/18/20 1547  Weight: 269 lb 12.8 oz (122.4 kg)     [name] Weekly seesion - 10/18/20 1500       Weekly Session   Topic Discussed Expectations and non-scale victories    Minutes exercised this week 240 minutes    Classes attended to date 9            Salt demo done Class held at [facility] 10/18/2020, 3:48 PM

## 2020-11-01 NOTE — Progress Notes (Signed)
[facility] PREP Weekly Session   Patient Details  Name: Sheryl Cox MRN: [id number] Date of Birth: 31-Dec-1970 Age: 50 y.o. PCP: Lucianne Lei, MD  Vitals:   11/01/20 1700  Weight: 267 lb 6.4 oz (121.3 kg)     [name] Weekly seesion - 11/01/20 1700       Weekly Session   Topic Discussed Finding support    Minutes exercised this week 270 minutes    Classes attended to date 11            Class at [facility] Discussed ways patient could recommit to program. She is at week 9 and end of class is 9/13 Reminded of lifestyle habits she has been taught to implement.   Barnett Hatter 11/01/2020, 5:01 PM

## 2020-11-08 NOTE — Progress Notes (Signed)
[facility] PREP Weekly Session   Patient Details  Name: Sheryl Cox MRN: [id number] Date of Birth: 23-May-1970 Age: 50 y.o. PCP: Lucianne Lei, MD  There were no vitals filed for this visit.   [name] Weekly seesion - 11/08/20 1500       Weekly Session   Topic Discussed Calorie breakdown    Minutes exercised this week 270 minutes    Classes attended to date 13            Class held at [facility] 11/08/2020, 3:21 PM

## 2020-11-29 NOTE — Progress Notes (Signed)
[facility] PREP Progress Report   Patient Details  Name: Sheryl Cox MRN: [id number] Date of Birth: 12/04/1970 Age: 50 y.o. PCP: Lucianne Lei, MD  Vitals:   11/28/20 1820  BP: 118/82  Pulse: 75  SpO2: 98%  Weight: 267 lb 3.2 oz (121.2 kg)      [name] Eval - 11/29/20 1500       Referral    Referring Provider [facility] Start Date --   PREP start 09/06/20 end date 11/29/20     Measurement   Waist Circumference 53.5 inches    Hip Circumference 59 inches    Body fat --   too high to count     Information for Trainer   Goals --   walk 20 min per day, keep sugars below 24 gms, strength train 3-4 days, sleep 7-9 hr a night, 1/2 body wt in ounces of water, no sodas     Mobility and Daily Activities   I find it easy to walk up or down two or more flights of stairs. 1    I have no trouble taking out the trash. 3    I do housework such as vacuuming and dusting on my own without difficulty. 2    I can easily lift a gallon of milk (8lbs). 2    I can easily walk a mile. 3    I have no trouble reaching into high cupboards or reaching down to pick up something from the floor. 2    I do not have trouble doing out-door work such as Armed forces logistics/support/administrative officer, raking leaves, or gardening. 1      Mobility and Daily Activities   I feel younger than my age. 1    I feel independent. 4    I feel energetic. 1    I live an active life.  2    I feel strong. 2    I feel healthy. 2    I feel active as other people my age. 1      How fit and strong are you.   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+, PR+.Takes Tamoxifen daily   Diabetes mellitus without complication ([place])    takes Metformin daily   History of migraine    several yrs ago   Hypertension    takes Tribenzor daily   Past Surgical  History:  Procedure Laterality Date   BIOPSY BREAST Left 12/05/12   fibroadenoma   BREAST LUMPECTOMY     2014   BREAST SURGERY Right    CESAREAN SECTION  01/05/2009   CHOLECYSTECTOMY N/A 01/31/2016   Procedure: LAPAROSCOPIC CHOLECYSTECTOMY;  Surgeon: Erroll Luna, MD;  Location: [facility];  Service: General;  Laterality: N/A;   DILITATION & CURRETTAGE/HYSTROSCOPY WITH HYDROTHERMAL ABLATION N/A 08/12/2013   Procedure: DILATATION & CURETTAGE/HYSTEROSCOPY WITH HYDROTHERMAL ABLATION;  Surgeon: Frederico Hamman, MD;  Location: [facility];  Service: Gynecology;  Laterality: N/A;   ROBOTIC ASSISTED TOTAL HYSTERECTOMY N/A 06/18/2017   Procedure: XI ROBOTIC ASSISTED TOTAL HYSTERECTOMY;  Surgeon: Lavonia Drafts, MD;  Location: [facility];  Service: Gynecology;  Laterality: N/A;   SALPINGOOPHORECTOMY Bilateral 06/18/2017   Procedure: BILATERAL SALPINGO OOPHORECTOMY;  Surgeon: Lavonia Drafts, MD;  Location:  [facility];  Service: Gynecology;  Laterality: Bilateral;   Social History   Tobacco Use  Smoking Status Never  Smokeless Tobacco Never   [facility] Weight loss not achieved Cardio march: 200 to 243 Sit to stand: 9 to 13 Bicep curl: 21 to 28 Balance on 1 leg much improved  Has a new job and new car for transportation. May benefit from repeating 12 wks. Reviewed necessity to continue to pursue health goals of weight loss, reduction of biomarkers.    Pam Tally Joe 11/29/2020, 3:10 PM

## 2021-01-10 ENCOUNTER — Other Ambulatory Visit: Payer: Self-pay

## 2021-01-10 ENCOUNTER — Encounter (HOSPITAL_COMMUNITY): Payer: Self-pay | Admitting: Emergency Medicine

## 2021-01-10 ENCOUNTER — Ambulatory Visit (HOSPITAL_COMMUNITY)
Admission: EM | Admit: 2021-01-10 | Discharge: 2021-01-10 | Disposition: A | Discharge status: Home / Self Care | Payer: Medicaid Other | Attending: Emergency Medicine | Admitting: Emergency Medicine

## 2021-01-10 DIAGNOSIS — S70312A Abrasion, left thigh, initial encounter: Secondary | ICD-10-CM

## 2021-01-10 DIAGNOSIS — T148XXA Other injury of unspecified body region, initial encounter: Secondary | ICD-10-CM

## 2021-01-10 LAB — POCT URINALYSIS DIPSTICK, ED / UC
Bilirubin Urine: NEGATIVE
Glucose, UA: NEGATIVE mg/dL
Hgb urine dipstick: NEGATIVE
Ketones, ur: NEGATIVE mg/dL
Leukocytes,Ua: NEGATIVE
Nitrite: NEGATIVE
Protein, ur: NEGATIVE mg/dL
Specific Gravity, Urine: 1.02 (ref 1.005–1.030)
Urobilinogen, UA: 0.2 mg/dL (ref 0.0–1.0)
pH: 5.5 (ref 5.0–8.0)

## 2021-01-10 NOTE — Discharge Instructions (Signed)
You have a small abrasion on your left upper thigh.   Return or go to the Emergency Department if symptoms worsen or do not improve in the next few days.

## 2021-01-10 NOTE — ED Triage Notes (Signed)
Pt is present today with vaginal bleeding. Pt noticed the bleeding today Pt states that she had a hysterectomy.

## 2021-01-10 NOTE — ED Provider Notes (Signed)
[facility]    CSN: [id number] Arrival date & time: 01/10/21  4098      History   Chief Complaint Chief Complaint  Patient presents with   Vaginal Bleeding    HPI Sheryl Cox is a 50 y.o. female.   Patient here for evaluation of bleeding that she first noticed this afternoon.  Reports she went to the bathroom noticed some bleeding and is unsure where the bleeding is coming from.  Reports having a hysterectomy in 2019.  Denies any dysuria, urgency, or frequency.  Denies any vaginal pain or irritation.  Denies any trauma, injury, or other precipitating event.  Denies any specific alleviating or aggravating factors.  Denies any fevers, chest pain, shortness of breath, N/V/D, numbness, tingling, weakness, abdominal pain, or headaches.    The history is provided by the patient.  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+, PR+.Takes Tamoxifen daily   Diabetes mellitus without complication ([place])    takes Metformin daily   History of migraine    several yrs ago   Hypertension    takes Tribenzor daily    Patient Active Problem List   Diagnosis Date Noted   Chronic pain of both knees 05/21/2018   Chronic right shoulder pain 05/21/2018   BMI 40.0-44.9, adult ([place]) 05/21/2018   Anemia 06/05/2013   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   Nabothian cyst 12/07/2011   Cervical mass 11/20/2011   Fibroid uterus 11/20/2011    Past Surgical History:  Procedure Laterality Date   BIOPSY BREAST Left 12/05/12   fibroadenoma   BREAST LUMPECTOMY     2014   BREAST SURGERY Right    CESAREAN SECTION  01/05/2009   CHOLECYSTECTOMY N/A 01/31/2016   Procedure: LAPAROSCOPIC CHOLECYSTECTOMY;  Surgeon: Erroll Luna, MD;  Location: [facility];  Service: General;  Laterality:  N/A;   DILITATION & CURRETTAGE/HYSTROSCOPY WITH HYDROTHERMAL ABLATION N/A 08/12/2013   Procedure: DILATATION & CURETTAGE/HYSTEROSCOPY WITH HYDROTHERMAL ABLATION;  Surgeon: Frederico Hamman, MD;  Location: [facility];  Service: Gynecology;  Laterality: N/A;   ROBOTIC ASSISTED TOTAL HYSTERECTOMY N/A 06/18/2017   Procedure: XI ROBOTIC ASSISTED TOTAL HYSTERECTOMY;  Surgeon: Lavonia Drafts, MD;  Location: [facility];  Service: Gynecology;  Laterality: N/A;   SALPINGOOPHORECTOMY Bilateral 06/18/2017   Procedure: BILATERAL SALPINGO OOPHORECTOMY;  Surgeon: Lavonia Drafts, MD;  Location: [facility];  Service: Gynecology;  Laterality: Bilateral;    OB History     Gravida  1   Para  1   Term  1   Preterm  0   AB  0   Living  1      SAB  0   IAB  0   Ectopic  0   Multiple  0   Live Births  1            Home Medications    Prior to Admission medications   Medication Sig Start Date End Date Taking? Authorizing Provider  acetaminophen (TYLENOL) 325 MG tablet Take 650 mg by mouth daily as needed for headache.    [provider]  albuterol (VENTOLIN HFA) 108 (90 Base) MCG/ACT inhaler Inhale 1-2 puffs into the lungs every 6 (six) hours as needed for wheezing or shortness of breath. 09/21/19   Wieters,  Hallie C, PA-C  ALPRAZolam (XANAX) 0.25 MG tablet TAKE 1 TABLET BY MOUTH TWICE A DAY AS NEEDED FOR ANXIETY 08/25/18   Lucianne Lei, MD  amLODipine (NORVASC) 10 MG tablet Take 10 mg by mouth daily. 02/23/17   [provider]  Ascorbic Acid (VITAMIN C PO) Take by mouth.    [provider]  benzonatate (TESSALON) 100 MG capsule Take 1 capsule (100 mg total) by mouth every 8 (eight) hours. 04/06/20   Hughie Closs, PA-C  celecoxib (CELEBREX) 200 MG capsule Take 1 capsule (200 mg total) by mouth 2 (two) times daily. 10/20/19   Margarita Mail, PA-C  cyclobenzaprine (FLEXERIL) 10 MG tablet Take 0.5-1 tablets (5-10 mg total) by mouth 2 (two) times daily as needed  for muscle spasms. 10/20/19   Margarita Mail, PA-C  diclofenac Sodium (VOLTAREN) 1 % GEL Apply 4 g topically 4 (four) times daily. 07/30/20   Fondaw, Wylder S, PA  ELDERBERRY PO Take by mouth.    [provider]  fluticasone (FLONASE) 50 MCG/ACT nasal spray Place 2 sprays into both nostrils daily. 05/12/18   Tasia Catchings, Amy V, PA-C  hydrochlorothiazide (HYDRODIURIL) 12.5 MG tablet Take 2 tablets (25 mg total) by mouth daily. Patient taking differently: Take 12.5 mg by mouth daily. 05/14/18   Scot Jun, FNP  meloxicam (MOBIC) 7.5 MG tablet Take 1 tablet (7.5 mg total) by mouth daily. 04/19/20   Jaynee Eagles, PA-C  metFORMIN (GLUCOPHAGE) 500 MG tablet Take 1 tablet (500 mg total) by mouth 2 (two) times daily with a meal. Patient taking differently: Take 500 mg by mouth daily. 06/19/17   Lavonia Drafts, MD  methocarbamol (ROBAXIN) 500 MG tablet Take 1 tablet (500 mg total) by mouth 2 (two) times daily. 07/30/20   Tedd Sias, PA  Multiple Vitamins-Minerals (MULTIVITAMIN PO) Take 1 tablet by mouth daily.    [provider]  ondansetron (ZOFRAN) 4 MG tablet Take 1 tablet (4 mg total) by mouth every 6 (six) hours. 04/06/20   Hughie Closs, PA-C  pantoprazole (PROTONIX) 20 MG tablet TAKE 1 TABLET BY MOUTH EVERY DAY 12/29/19   Nicholas Lose, MD  promethazine-dextromethorphan (PROMETHAZINE-DM) 6.25-15 MG/5ML syrup Take 5 mLs by mouth 4 (four) times daily as needed for cough. 01/25/20   Enrique Sack, FNP  Semaglutide (RYBELSUS) 7 MG TABS Take by mouth.    [provider]  tamoxifen (NOLVADEX) 20 MG tablet Take 1 tablet (20 mg total) by mouth daily. 09/11/19   Nicholas Lose, MD  tiZANidine (ZANAFLEX) 4 MG tablet Take 1 tablet (4 mg total) by mouth at bedtime. 04/19/20   Jaynee Eagles, PA-C  traMADol (ULTRAM) 50 MG tablet SMARTSIG:1 Tablet(s) By Mouth Every 12 Hours PRN 10/26/19   [provider]  Vitamin D, Ergocalciferol, (DRISDOL) 1.25 MG (50000 UNIT) CAPS capsule  Take 50,000 Units by mouth once a week. 10/26/19   [provider]    Family History Family History  Problem Relation Age of Onset   Hypertension Mother    Diabetes Mother    COPD Mother    Breast cancer Paternal Grandmother        dx in her 43s   Prostate cancer Paternal Grandfather    Prostate cancer Father 3   Prostate cancer Paternal Uncle    Breast cancer Other        paternal grandmother's sister   Breast cancer Paternal Aunt    Anesthesia problems Neg Hx    Amblyopia Neg Hx    Blindness  Neg Hx    Cataracts Neg Hx    Glaucoma Neg Hx    Macular degeneration Neg Hx    Retinal detachment Neg Hx    Strabismus Neg Hx    Retinitis pigmentosa Neg Hx     Social History Social History   Tobacco Use   Smoking status: Never   Smokeless tobacco: Never  Vaping Use   Vaping Use: Never used  Substance Use Topics   Alcohol use: Yes    Alcohol/week: 1.0 standard drink    Types: 1 Cans of beer per week    Comment: occa   Drug use: No     Allergies   Shrimp [shellfish allergy] and Aleve [naproxen]   Review of Systems Review of Systems  Genitourinary:  Positive for vaginal bleeding. Negative for dysuria, frequency and urgency.  All other systems reviewed and are negative.   Physical Exam Triage Vital Signs ED Triage Vitals  Enc Vitals Group     BP 01/10/21 1921 (!) 141/91     Pulse Rate 01/10/21 1921 (!) 116     Resp 01/10/21 1921 18     Temp 01/10/21 1924 97.6 F (36.4 C)     Temp Source 01/10/21 1924 Tympanic     SpO2 01/10/21 1921 100 %     Weight --      Height --      Head Circumference --      Peak Flow --      Pain Score 01/10/21 1922 0     Pain Loc --      Pain Edu? --      Excl. in [place]? --    No data found.  Updated Vital Signs BP (!) 141/91   Pulse (!) 116   Temp 97.6 F (36.4 C) (Tympanic)   Resp 18   LMP 05/27/2017 (Approximate)   SpO2 100%   Visual Acuity Right Eye Distance:   Left Eye Distance:   Bilateral Distance:     Right Eye Near:   Left Eye Near:    Bilateral Near:     Physical Exam Vitals and nursing note reviewed. Chaperone present: declined chaperone.  Constitutional:      General: She is not in acute distress.    Appearance: Normal appearance. She is not ill-appearing, toxic-appearing or diaphoretic.  HENT:     Head: Normocephalic and atraumatic.  Eyes:     Conjunctiva/sclera: Conjunctivae normal.  Cardiovascular:     Rate and Rhythm: Normal rate.     Pulses: Normal pulses.  Pulmonary:     Effort: Pulmonary effort is normal.  Abdominal:     General: Abdomen is flat.  Genitourinary:    Exam position: Lithotomy position.     Pubic Area: No rash or pubic lice.      Labia:        Right: No rash, tenderness, lesion or injury.        Left: No rash, tenderness, lesion or injury.      Urethra: No prolapse, urethral pain, urethral swelling or urethral lesion.     Vagina: No bleeding.     Cervix: No cervical bleeding.     Rectum: Normal.  Musculoskeletal:        General: Normal range of motion.     Cervical back: Normal range of motion.  Skin:    General: Skin is warm and dry.     Findings: Abrasion (approximately 1 cm abrasion to left upper thigh) present.  Neurological:  General: No focal deficit present.     Mental Status: She is alert and oriented to person, place, and time.  Psychiatric:        Mood and Affect: Mood normal.     UC Treatments / Results  Labs (all labs ordered are listed, but only abnormal results are displayed) Labs Reviewed  POCT URINALYSIS DIPSTICK, ED / UC    EKG   Radiology No results found.  Procedures Procedures (including critical care time)  Medications Ordered in UC Medications - No data to display  Initial Impression / Assessment and Plan / UC Course  I have reviewed the triage vital signs and the nursing notes.  Pertinent labs & imaging results that were available during my care of the patient were reviewed by me and considered  in my medical decision making (see chart for details).    Assessment negative for red flags or concerns.  Small abrasion to left upper thigh that is likely the source of the bleeding.  No active bleeding noticed in vagina or rectum.  Urinalysis negative for hematuria.  Follow-up with primary care for reevaluation as needed. Final Clinical Impressions(s) / UC Diagnoses   Final diagnoses:  Abrasion     Discharge Instructions      You have a small abrasion on your left upper thigh.   Return or go to the Emergency Department if symptoms worsen or do not improve in the next few days.      ED Prescriptions   None    PDMP not reviewed this encounter.   Pearson Forster, NP 01/10/21 2051

## 2021-02-08 ENCOUNTER — Other Ambulatory Visit: Payer: Self-pay | Admitting: Hematology and Oncology

## 2021-02-08 ENCOUNTER — Other Ambulatory Visit: Payer: Self-pay | Admitting: *Deleted

## 2021-02-08 MED ORDER — TAMOXIFEN CITRATE 20 MG PO TABS: 20.0000 mg | ORAL_TABLET | Freq: Every day | ORAL | 0 refills | Status: DC

## 2021-02-10 ENCOUNTER — Telehealth: Payer: Self-pay | Admitting: Hematology and Oncology

## 2021-02-10 NOTE — Telephone Encounter (Signed)
Scheduled appointment per 11/23 scheduling message. Patient is aware.

## 2021-03-25 ENCOUNTER — Encounter (HOSPITAL_COMMUNITY): Payer: Self-pay | Admitting: *Deleted

## 2021-03-25 ENCOUNTER — Other Ambulatory Visit: Payer: Self-pay

## 2021-03-25 ENCOUNTER — Ambulatory Visit (HOSPITAL_COMMUNITY)
Admission: EM | Admit: 2021-03-25 | Discharge: 2021-03-25 | Disposition: A | Discharge status: Home / Self Care | Payer: Medicaid Other | Attending: Internal Medicine | Admitting: Internal Medicine

## 2021-03-25 DIAGNOSIS — R42 Dizziness and giddiness: Secondary | ICD-10-CM | POA: Diagnosis not present

## 2021-03-25 DIAGNOSIS — Z20822 Contact with and (suspected) exposure to covid-19: Secondary | ICD-10-CM | POA: Insufficient documentation

## 2021-03-25 DIAGNOSIS — R509 Fever, unspecified: Secondary | ICD-10-CM | POA: Diagnosis not present

## 2021-03-25 LAB — CBG MONITORING, ED: Glucose-Capillary: 88 mg/dL (ref 70–99)

## 2021-03-25 MED ORDER — MECLIZINE HCL 12.5 MG PO TABS: 12.5000 mg | ORAL_TABLET | Freq: Three times a day (TID) | ORAL | 0 refills | Status: DC | PRN

## 2021-03-25 NOTE — ED Triage Notes (Signed)
Pt reports she felt dizzy this morning while cooking pancakes. Pt reports she is a NIDDM  and does not check her BS. Pt denies any N/V/D.

## 2021-03-25 NOTE — ED Provider Notes (Signed)
[facility]    CSN: [id number] Arrival date & time: 03/25/21  1018      History   Chief Complaint Chief Complaint  Patient presents with   Dizziness    HPI Sheryl Cox is a 51 y.o. female.   HPI  Dizziness: Patient states that this morning she felt dizzy while cooking pancakes. She reports that she turned around fast and the room appeared to be spinning. Symptoms reduced when she stopped moving but she was still having some dizziness despite sitting down for about 15 minutes. No she reports that she she does large head movements the spinning returned. No head injury. She has not had anything to eat or drink today. She denies LOC, headache, chest pain, SOB, neuro weakness, no dysuria.   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   Nabothian cyst 12/07/2011   Cervical mass 11/20/2011   Fibroid uterus 11/20/2011    Past Surgical History:  Procedure Laterality Date   BIOPSY BREAST Left 12/05/12   fibroadenoma   BREAST LUMPECTOMY     2014   BREAST SURGERY Right    CESAREAN SECTION  01/05/2009   CHOLECYSTECTOMY N/A 01/31/2016   Procedure: LAPAROSCOPIC CHOLECYSTECTOMY;  Surgeon: Erroll Luna, MD;  Location: [place] [facility];  Service: General;  Laterality: N/A;   DILITATION & CURRETTAGE/HYSTROSCOPY WITH HYDROTHERMAL ABLATION N/A 08/12/2013   Procedure: DILATATION & CURETTAGE/HYSTEROSCOPY WITH  HYDROTHERMAL ABLATION;  Surgeon: Frederico Hamman, MD;  Location: [facility];  Service: Gynecology;  Laterality: N/A;   ROBOTIC ASSISTED TOTAL HYSTERECTOMY N/A 06/18/2017   Procedure: XI ROBOTIC ASSISTED TOTAL HYSTERECTOMY;  Surgeon: Lavonia Drafts, MD;  Location: [facility];  Service: Gynecology;  Laterality: N/A;   SALPINGOOPHORECTOMY Bilateral 06/18/2017   Procedure: BILATERAL SALPINGO OOPHORECTOMY;  Surgeon: Lavonia Drafts, MD;  Location: [facility];  Service: Gynecology;  Laterality: Bilateral;    OB History     Gravida  1   Para  1   Term  1   Preterm  0   AB  0   Living  1      SAB  0   IAB  0   Ectopic  0   Multiple  0   Live Births  1            Home Medications    Prior to Admission medications   Medication Sig Start Date End Date Taking? Authorizing Provider  meclizine (ANTIVERT) 12.5 MG tablet Take 1 tablet (12.5 mg total) by mouth 3 (three) times daily as needed for dizziness. 03/25/21  Yes Daune Colgate M, PA-C  acetaminophen (TYLENOL) 325 MG tablet Take 650 mg by mouth daily as needed for headache.    [provider]  albuterol (VENTOLIN HFA) 108 (90 Base) MCG/ACT inhaler Inhale 1-2 puffs into the lungs every 6 (six) hours as needed for  wheezing or shortness of breath. 09/21/19   Wieters, Hallie C, PA-C  ALPRAZolam (XANAX) 0.25 MG tablet TAKE 1 TABLET BY MOUTH TWICE A DAY AS NEEDED FOR ANXIETY 08/25/18   Lucianne Lei, MD  amLODipine (NORVASC) 10 MG tablet Take 10 mg by mouth daily. 02/23/17   [provider]  Ascorbic Acid (VITAMIN C PO) Take by mouth.    [provider]  benzonatate (TESSALON) 100 MG capsule Take 1 capsule (100 mg total) by mouth every 8 (eight) hours. 04/06/20   Hughie Closs, PA-C  celecoxib (CELEBREX) 200 MG capsule Take 1 capsule (200 mg total) by mouth 2 (two) times daily. 10/20/19   Margarita Mail, PA-C  cyclobenzaprine (FLEXERIL) 10 MG tablet Take 0.5-1 tablets (5-10 mg total) by mouth 2 (two)  times daily as needed for muscle spasms. 10/20/19   Margarita Mail, PA-C  diclofenac Sodium (VOLTAREN) 1 % GEL Apply 4 g topically 4 (four) times daily. 07/30/20   Fondaw, Wylder S, PA  ELDERBERRY PO Take by mouth.    [provider]  fluticasone (FLONASE) 50 MCG/ACT nasal spray Place 2 sprays into both nostrils daily. 05/12/18   Tasia Catchings, Amy V, PA-C  hydrochlorothiazide (HYDRODIURIL) 12.5 MG tablet Take 2 tablets (25 mg total) by mouth daily. Patient taking differently: Take 12.5 mg by mouth daily. 05/14/18   Scot Jun, FNP  meloxicam (MOBIC) 7.5 MG tablet Take 1 tablet (7.5 mg total) by mouth daily. 04/19/20   Jaynee Eagles, PA-C  metFORMIN (GLUCOPHAGE) 500 MG tablet Take 1 tablet (500 mg total) by mouth 2 (two) times daily with a meal. Patient taking differently: Take 500 mg by mouth daily. 06/19/17   Lavonia Drafts, MD  methocarbamol (ROBAXIN) 500 MG tablet Take 1 tablet (500 mg total) by mouth 2 (two) times daily. 07/30/20   Tedd Sias, PA  Multiple Vitamins-Minerals (MULTIVITAMIN PO) Take 1 tablet by mouth daily.    [provider]  ondansetron (ZOFRAN) 4 MG tablet Take 1 tablet (4 mg total) by mouth every 6 (six) hours. 04/06/20   Hughie Closs, PA-C  pantoprazole (PROTONIX) 20 MG tablet TAKE 1 TABLET BY MOUTH EVERY DAY 12/29/19   Nicholas Lose, MD  promethazine-dextromethorphan (PROMETHAZINE-DM) 6.25-15 MG/5ML syrup Take 5 mLs by mouth 4 (four) times daily as needed for cough. 01/25/20   Enrique Sack, FNP  Semaglutide (RYBELSUS) 7 MG TABS Take by mouth.    [provider]  tamoxifen (NOLVADEX) 20 MG tablet Take 1 tablet (20 mg total) by mouth daily. 02/08/21   Nicholas Lose, MD  tiZANidine (ZANAFLEX) 4 MG tablet Take 1 tablet (4 mg total) by mouth at bedtime. 04/19/20   Jaynee Eagles, PA-C  traMADol (ULTRAM) 50 MG tablet SMARTSIG:1 Tablet(s) By Mouth Every 12 Hours PRN 10/26/19   [provider]  Vitamin D, Ergocalciferol, (DRISDOL) 1.25 MG  (50000 UNIT) CAPS capsule Take 50,000 Units by mouth once a week. 10/26/19   [provider]    Family History Family History  Problem Relation Age of Onset   Hypertension Mother    Diabetes Mother    COPD Mother    Breast cancer Paternal Grandmother        dx in her 41s   Prostate cancer Paternal Grandfather    Prostate cancer Father 35   Prostate cancer Paternal Uncle    Breast cancer Other        paternal grandmother's sister   Breast cancer Paternal Aunt    Anesthesia problems Neg Hx  Amblyopia Neg Hx    Blindness Neg Hx    Cataracts Neg Hx    Glaucoma Neg Hx    Macular degeneration Neg Hx    Retinal detachment Neg Hx    Strabismus Neg Hx    Retinitis pigmentosa Neg Hx     Social History Social History   Tobacco Use   Smoking status: Never   Smokeless tobacco: Never  Vaping Use   Vaping Use: Never used  Substance Use Topics   Alcohol use: Yes    Alcohol/week: 1.0 standard drink    Types: 1 Cans of beer per week    Comment: occa   Drug use: No     Allergies   Shrimp [shellfish allergy] and Aleve [naproxen]   Review of Systems Review of Systems  As stated above in HPI Physical Exam Triage Vital Signs ED Triage Vitals  Enc Vitals Group     BP 03/25/21 1130 (!) 173/115     Pulse Rate 03/25/21 1130 92     Resp 03/25/21 1130 20     Temp 03/25/21 1130 99.1 F (37.3 C)     Temp src --      SpO2 03/25/21 1130 100 %     Weight --      Height --      Head Circumference --      Peak Flow --      Pain Score 03/25/21 1134 0     Pain Loc --      Pain Edu? --      Excl. in [place]? --    No data found.  Updated Vital Signs BP (!) 173/115    Pulse 92    Temp 99.1 F (37.3 C)    Resp 20    LMP 05/27/2017 (Approximate)    SpO2 100%   Physical Exam Vitals and nursing note reviewed.  Constitutional:      General: She is not in acute distress.    Appearance: Normal appearance. She is obese. She is not ill-appearing, toxic-appearing or diaphoretic.   HENT:     Head: Normocephalic and atraumatic.     Right Ear: Tympanic membrane normal.     Left Ear: Tympanic membrane normal.     Ears:     Comments: Mild clear middle ear effusion bilaterally     Nose: Congestion (scant) present. No rhinorrhea.     Mouth/Throat:     Mouth: Mucous membranes are moist.     Pharynx: Oropharynx is clear. No oropharyngeal exudate or posterior oropharyngeal erythema.  Eyes:     Extraocular Movements: Extraocular movements intact.     Pupils: Pupils are equal, round, and reactive to light.     Comments: Mild horizontal nystagmus of the left eye to EOMs  Neck:     Vascular: No carotid bruit.  Cardiovascular:     Rate and Rhythm: Normal rate and regular rhythm.     Heart sounds: Normal heart sounds.  Pulmonary:     Effort: Pulmonary effort is normal.     Breath sounds: Normal breath sounds.  Musculoskeletal:     Cervical back: Normal range of motion and neck supple.  Lymphadenopathy:     Cervical: No cervical adenopathy.  Skin:    General: Skin is warm.     Coloration: Skin is not pale.  Neurological:     General: No focal deficit present.     Mental Status: She is alert and oriented to Cox, place, and time.  Cranial Nerves: No cranial nerve deficit.     Motor: No weakness.     Coordination: Coordination normal.     Gait: Gait normal.     Deep Tendon Reflexes: Reflexes normal.  Psychiatric:        Mood and Affect: Mood normal.        Behavior: Behavior normal.        Thought Content: Thought content normal.        Judgment: Judgment normal.     UC Treatments / Results  Labs (all labs ordered are listed, but only abnormal results are displayed) Labs Reviewed  CBG MONITORING, ED    EKG   Radiology No results found.  Procedures Procedures (including critical care time)  Medications Ordered in UC Medications - No data to display  Initial Impression / Assessment and Plan / UC Course  I have reviewed the triage vital signs  and the nursing notes.  Pertinent labs & imaging results that were available during my care of the patient were reviewed by me and considered in my medical decision making (see chart for details).     New.  Glucose normal in office today.  NO neck stiffness or other sign of meningitis. Appears to be BPPV potentially related to viral etiology.  COVID-19 testing pending given her nine 9.1 degree temperature.  For now we will treat with meclizine, fluids and rest.  She will avoid large head movements and I have included information about the Epley maneuver.  Discussed red flag signs and symptoms. BP improved with recheck. She would benefit from hydration with water and low sodium diet.  Final Clinical Impressions(s) / UC Diagnoses   Final diagnoses:  Vertigo  Low grade fever   Discharge Instructions   None    ED Prescriptions     Medication Sig Dispense Auth. Provider   meclizine (ANTIVERT) 12.5 MG tablet Take 1 tablet (12.5 mg total) by mouth 3 (three) times daily as needed for dizziness. 30 tablet Hughie Closs, [place]      PDMP not reviewed this encounter.   Hughie Closs, [place] 03/25/21 1225

## 2021-03-26 LAB — SARS CORONAVIRUS 2 (TAT 6-24 HRS): SARS Coronavirus 2: NEGATIVE

## 2021-05-12 NOTE — Progress Notes (Signed)
Patient Care Team: Lucianne Lei, MD as PCP - General (Family Medicine) Elouise Munroe, MD as PCP - Cardiology (Cardiology)  DIAGNOSIS:    ICD-10-CM   1. Ductal carcinoma in situ (DCIS) of right breast  D05.11       SUMMARY OF ONCOLOGIC HISTORY: Oncology History  Ductal carcinoma in situ (DCIS) of right breast  11/06/2012 Mammogram    Numerous calcifications appear both linear in morphology and linear in distribution and extend over a large portion of the breast.  The longest area is 6.0 x 1.2 cm.  A second area is 6.0 x 1.4 cm.  Also closer to the nipple, two discrete calcifications   11/21/2012 Initial Diagnosis   Breast cancer of upper-outer quadrant of right female breast; ER 100% PR 100% DCIS with calcifications based on biopsy of the right breast.   11/26/2012 Breast MRI   upper inner quadrant of the posterior left breast, there is an 8 x 7 x 7 mm circumscribed enhancing mass;    03/25/2013 - 05/02/2013 Radiation Therapy   50 Gy to the right breast    05/19/2013 -  Anti-estrogen oral therapy   Tamoxifen 20 mg daily   02/23/2014 Surgery   Right Lumpectomy: At [facility]; DCIS, ER/PR Positive   06/26/2017 Surgery   Hysterectomy with BSO     CHIEF COMPLIANT: Follow-up of right breast DCIS on tamoxifen  INTERVAL HISTORY: Sheryl Cox is a 51 y.o. with above-mentioned history of right breast DCIS who underwent lumpectomy and is currently on tamoxifen. She presents to the clinic today for follow-up.   ALLERGIES:  is allergic to shrimp [shellfish allergy] and aleve [naproxen].  MEDICATIONS:  Current Outpatient Medications  Medication Sig Dispense Refill   acetaminophen (TYLENOL) 325 MG tablet Take 650 mg by mouth daily as needed for headache.     albuterol (VENTOLIN HFA) 108 (90 Base) MCG/ACT inhaler Inhale 1-2 puffs into the lungs every 6 (six) hours as needed for wheezing or shortness of breath. 18 g 0   ALPRAZolam (XANAX) 0.25 MG tablet TAKE 1 TABLET BY MOUTH  TWICE A DAY AS NEEDED FOR ANXIETY     amLODipine (NORVASC) 10 MG tablet Take 10 mg by mouth daily.  3   Ascorbic Acid (VITAMIN C PO) Take by mouth.     benzonatate (TESSALON) 100 MG capsule Take 1 capsule (100 mg total) by mouth every 8 (eight) hours. 21 capsule 0   celecoxib (CELEBREX) 200 MG capsule Take 1 capsule (200 mg total) by mouth 2 (two) times daily. 20 capsule 0   cyclobenzaprine (FLEXERIL) 10 MG tablet Take 0.5-1 tablets (5-10 mg total) by mouth 2 (two) times daily as needed for muscle spasms. 20 tablet 0   diclofenac Sodium (VOLTAREN) 1 % GEL Apply 4 g topically 4 (four) times daily. 100 g 0   ELDERBERRY PO Take by mouth.     fluticasone (FLONASE) 50 MCG/ACT nasal spray Place 2 sprays into both nostrils daily. 1 g 0   hydrochlorothiazide (HYDRODIURIL) 12.5 MG tablet Take 2 tablets (25 mg total) by mouth daily. (Patient taking differently: Take 12.5 mg by mouth daily.) 30 tablet 3   meclizine (ANTIVERT) 12.5 MG tablet Take 1 tablet (12.5 mg total) by mouth 3 (three) times daily as needed for dizziness. 30 tablet 0   meloxicam (MOBIC) 7.5 MG tablet Take 1 tablet (7.5 mg total) by mouth daily. 30 tablet 0   metFORMIN (GLUCOPHAGE) 500 MG tablet Take 1 tablet (500 mg total) by  mouth 2 (two) times daily with a meal. (Patient taking differently: Take 500 mg by mouth daily.) 60 tablet 2   methocarbamol (ROBAXIN) 500 MG tablet Take 1 tablet (500 mg total) by mouth 2 (two) times daily. 20 tablet 0   Multiple Vitamins-Minerals (MULTIVITAMIN PO) Take 1 tablet by mouth daily.     ondansetron (ZOFRAN) 4 MG tablet Take 1 tablet (4 mg total) by mouth every 6 (six) hours. 12 tablet 0   pantoprazole (PROTONIX) 20 MG tablet TAKE 1 TABLET BY MOUTH EVERY DAY 30 tablet 0   promethazine-dextromethorphan (PROMETHAZINE-DM) 6.25-15 MG/5ML syrup Take 5 mLs by mouth 4 (four) times daily as needed for cough. 90 mL 0   Semaglutide (RYBELSUS) 7 MG TABS Take by mouth.     tamoxifen (NOLVADEX) 20 MG tablet Take 1  tablet (20 mg total) by mouth daily. 90 tablet 0   tiZANidine (ZANAFLEX) 4 MG tablet Take 1 tablet (4 mg total) by mouth at bedtime. 30 tablet 0   traMADol (ULTRAM) 50 MG tablet SMARTSIG:1 Tablet(s) By Mouth Every 12 Hours PRN     Vitamin D, Ergocalciferol, (DRISDOL) 1.25 MG (50000 UNIT) CAPS capsule Take 50,000 Units by mouth once a week.     No current facility-administered medications for this visit.    PHYSICAL EXAMINATION: ECOG PERFORMANCE STATUS: 1 - Symptomatic but completely ambulatory  Vitals:   05/15/21 0926 05/15/21 0927  BP: (!) 165/103 (!) 162/107  Pulse: (!) 104   Resp: 17   Temp: (!) 97.5 F (36.4 C)   SpO2: 100%    Filed Weights   05/15/21 0926  Weight: 271 lb 12.8 oz (123.3 kg)    BREAST: No palpable masses or nodules in either right or left breasts. No palpable axillary supraclavicular or infraclavicular adenopathy no breast tenderness or nipple discharge. (exam performed in the presence of a chaperone)  LABORATORY DATA:  I have reviewed the data as listed CMP Latest Ref Rng & Units 08/04/2019 04/16/2019 08/13/2018  Glucose 70 - 99 mg/dL 114(H) 105(H) 118(H)  BUN 6 - 20 mg/dL 19 12 15   Creatinine 0.44 - 1.00 mg/dL 0.85 0.72 0.78  Sodium 135 - 145 mmol/L 139 139 136  Potassium 3.5 - 5.1 mmol/L 4.0 3.6 3.4(L)  Chloride 98 - 111 mmol/L 100 102 99  CO2 22 - 32 mmol/L 26 27 26   Calcium 8.9 - 10.3 mg/dL 9.8 9.3 9.9  Total Protein 6.5 - 8.1 g/dL 8.0 - -  Total Bilirubin 0.3 - 1.2 mg/dL 0.3 - -  Alkaline Phos 38 - 126 U/L 37(L) - -  AST 15 - 41 U/L 15 - -  ALT 0 - 44 U/L 16 - -    Lab Results  Component Value Date   WBC 7.5 08/04/2019   HGB 11.5 (L) 08/04/2019   HCT 37.6 08/04/2019   MCV 78.8 (L) 08/04/2019   PLT 349 08/04/2019   NEUTROABS 4.6 08/04/2019    ASSESSMENT & PLAN:  Ductal carcinoma in situ (DCIS) of right breast Right breast DCIS ER/PR positive status post lumpectomy at [facility] currently on tamoxifen 10 mg daily March 2015    Tamoxifen Toxicities: Denies side effects of tamoxifen therapy. Patient wishes to stay on antiestrogen therapy with tamoxifen for 10 years even though data for DCIS is only up to 5. Patient understands risks and benefits.   Hypertension: On blood pressure medication. Follows with her primary care physician. 2019 total hysterectomy with bilateral salpingo-oophorectomy.   Surveillance:  Mammogram at [facility] 04/20/2021:  Benign. She also follows up with her surgeon and with radiation oncologist.  Bilateral reduction mammoplasty changes increased coarse dystrophic calcifications right breast related to fat necrosis. Breast exam  05/15/2021: Benign   History of mild microcytic anemia: She follows with her primary care physician. Obesity:  Once again we discussed about obesity and risk factor for breast cancer.  Encouraged her to stay more active and watch her diet.   Return to clinic in 1 year for follow-up    No orders of the defined types were placed in this encounter.  The patient has a good understanding of the overall plan. she agrees with it. she will call with any problems that may develop before the next visit here.  Total time spent: 20 mins including face to face time and time spent for planning, charting and coordination of care  Rulon Eisenmenger, MD, MPH 05/15/2021  I, Thana Ates, am acting as scribe for Dr. Nicholas Lose.  I have reviewed the above documentation for accuracy and completeness, and I agree with the above.

## 2021-05-13 ENCOUNTER — Other Ambulatory Visit: Payer: Self-pay | Admitting: Hematology and Oncology

## 2021-05-15 ENCOUNTER — Inpatient Hospital Stay: Payer: Medicaid Other | Attending: Hematology and Oncology | Admitting: Hematology and Oncology

## 2021-05-15 ENCOUNTER — Other Ambulatory Visit: Payer: Self-pay

## 2021-05-15 DIAGNOSIS — Z79899 Other long term (current) drug therapy: Secondary | ICD-10-CM | POA: Insufficient documentation

## 2021-05-15 DIAGNOSIS — I10 Essential (primary) hypertension: Secondary | ICD-10-CM | POA: Insufficient documentation

## 2021-05-15 DIAGNOSIS — Z79810 Long term (current) use of selective estrogen receptor modulators (SERMs): Secondary | ICD-10-CM | POA: Insufficient documentation

## 2021-05-15 DIAGNOSIS — E669 Obesity, unspecified: Secondary | ICD-10-CM | POA: Diagnosis not present

## 2021-05-15 DIAGNOSIS — Z862 Personal history of diseases of the blood and blood-forming organs and certain disorders involving the immune mechanism: Secondary | ICD-10-CM | POA: Diagnosis not present

## 2021-05-15 DIAGNOSIS — D0511 Intraductal carcinoma in situ of right breast: Secondary | ICD-10-CM | POA: Insufficient documentation

## 2021-05-15 MED ORDER — TAMOXIFEN CITRATE 20 MG PO TABS: 20.0000 mg | ORAL_TABLET | Freq: Every day | ORAL | 3 refills | Status: DC

## 2021-05-15 NOTE — Assessment & Plan Note (Addendum)
Right breast DCIS ER/PR positive status post lumpectomy at [facility] currently on tamoxifen 10 mg daily March 2015  Tamoxifen Toxicities: Denies side effects of tamoxifen therapy. Patient wishes to stay on antiestrogen therapy with tamoxifen for 10 years even though data for DCIS is only up to 5. Patient understands risks and benefits.  Hypertension: On blood pressure medication. Follows with her primary care physician. 2019total hysterectomy with bilateral salpingo-oophorectomy.  Surveillance:  [facility]: Benign. She also follows up with her surgeon and with radiation oncologist.  Bilateral reduction mammoplasty changes increased coarse dystrophic calcifications right breast related to fat necrosis. Breast exam  05/15/2021: Benign  History of microcytic anemia:She follows with her primary care physician. Obesity: Once again we discussed about obesity and risk factor for breast cancer.  Encouraged her to stay more active and watch her diet.  Return to clinic in 1 year for follow-up

## 2021-05-16 ENCOUNTER — Telehealth: Payer: Self-pay | Admitting: Hematology and Oncology

## 2021-05-16 NOTE — Telephone Encounter (Signed)
Scheduled appointment per 2/27 los. Originally left patient a message about the appointment that was made. During the charting process, the patient called back and we over the made appointment. The patient is aware.

## 2021-05-24 ENCOUNTER — Encounter (HOSPITAL_COMMUNITY): Payer: Self-pay

## 2021-05-24 ENCOUNTER — Ambulatory Visit (INDEPENDENT_AMBULATORY_CARE_PROVIDER_SITE_OTHER): Payer: Medicaid Other

## 2021-05-24 ENCOUNTER — Ambulatory Visit (HOSPITAL_COMMUNITY)
Admission: EM | Admit: 2021-05-24 | Discharge: 2021-05-24 | Disposition: A | Discharge status: Home / Self Care | Payer: Medicaid Other | Attending: Family Medicine | Admitting: Family Medicine

## 2021-05-24 ENCOUNTER — Other Ambulatory Visit: Payer: Self-pay

## 2021-05-24 DIAGNOSIS — R0989 Other specified symptoms and signs involving the circulatory and respiratory systems: Secondary | ICD-10-CM

## 2021-05-24 DIAGNOSIS — R0600 Dyspnea, unspecified: Secondary | ICD-10-CM

## 2021-05-24 DIAGNOSIS — R059 Cough, unspecified: Secondary | ICD-10-CM

## 2021-05-24 DIAGNOSIS — J45909 Unspecified asthma, uncomplicated: Secondary | ICD-10-CM | POA: Diagnosis not present

## 2021-05-24 DIAGNOSIS — J069 Acute upper respiratory infection, unspecified: Secondary | ICD-10-CM | POA: Insufficient documentation

## 2021-05-24 DIAGNOSIS — U071 COVID-19: Secondary | ICD-10-CM | POA: Insufficient documentation

## 2021-05-24 LAB — POC INFLUENZA A AND B ANTIGEN (URGENT CARE ONLY)
INFLUENZA A ANTIGEN, POC: NEGATIVE
INFLUENZA B ANTIGEN, POC: NEGATIVE

## 2021-05-24 MED ORDER — BENZONATATE 100 MG PO CAPS: 100.0000 mg | ORAL_CAPSULE | Freq: Three times a day (TID) | ORAL | 0 refills | Status: DC | PRN

## 2021-05-24 NOTE — ED Triage Notes (Addendum)
Pt c/o cough, congestion and chills that began 3 days ago . At home Covid test was negative per patient.  ?

## 2021-05-24 NOTE — ED Provider Notes (Addendum)
[facility]    CSN: [id number] Arrival date & time: 05/24/21  1929      History   Chief Complaint Chief Complaint  Patient presents with   Cough   Nasal Congestion   Chills    HPI Sheryl Cox is a 51 y.o. female.    Cough Here with cough, congestion, and chills since March 5.  She is unsure if she has had fever, but she has had lots of myalgia and fatigue.  She is also felt short of breath some.  No history of asthma noted.  No vomiting or diarrhea.  Past medical history is significant for hypertension and diabetes.  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+, PR+.Takes Tamoxifen daily   Diabetes mellitus without complication ([place])    takes Metformin daily   History of migraine    several yrs ago   Hypertension    takes Tribenzor daily    Patient Active Problem List   Diagnosis Date Noted   Chronic pain of both knees 05/21/2018   Chronic right shoulder pain 05/21/2018   BMI 40.0-44.9, adult ([place]) 05/21/2018   Anemia 06/05/2013   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   Nabothian cyst 12/07/2011   Cervical mass 11/20/2011   Fibroid uterus 11/20/2011    Past Surgical History:  Procedure Laterality Date   BIOPSY BREAST Left 12/05/12   fibroadenoma   BREAST LUMPECTOMY     2014   BREAST SURGERY Right    CESAREAN SECTION  01/05/2009   CHOLECYSTECTOMY N/A 01/31/2016   Procedure: LAPAROSCOPIC CHOLECYSTECTOMY;  Surgeon: Erroll Luna, MD;  Location: [facility];  Service: General;  Laterality: N/A;   DILITATION & CURRETTAGE/HYSTROSCOPY WITH HYDROTHERMAL ABLATION N/A 08/12/2013   Procedure: DILATATION & CURETTAGE/HYSTEROSCOPY WITH HYDROTHERMAL ABLATION;  Surgeon: Frederico Hamman, MD;  Location: [facility];  Service:  Gynecology;  Laterality: N/A;   ROBOTIC ASSISTED TOTAL HYSTERECTOMY N/A 06/18/2017   Procedure: XI ROBOTIC ASSISTED TOTAL HYSTERECTOMY;  Surgeon: Lavonia Drafts, MD;  Location: [facility];  Service: Gynecology;  Laterality: N/A;   SALPINGOOPHORECTOMY Bilateral 06/18/2017   Procedure: BILATERAL SALPINGO OOPHORECTOMY;  Surgeon: Lavonia Drafts, MD;  Location: [facility];  Service: Gynecology;  Laterality: Bilateral;    OB History     Gravida  1   Para  1   Term  1   Preterm  0   AB  0   Living  1      SAB  0   IAB  0   Ectopic  0   Multiple  0   Live Births  1            Home Medications    Prior to Admission medications   Medication Sig Start Date End Date Taking? Authorizing Provider  benzonatate (TESSALON) 100 MG capsule Take 1 capsule (100 mg total) by mouth 3 (three) times daily as needed for cough. 05/24/21  Yes Barrett Henle, MD  acetaminophen (TYLENOL) 325 MG tablet Take 650 mg by mouth daily as needed for headache. Patient not taking: Reported on 05/15/2021    [provider]  albuterol (VENTOLIN HFA) 108 (90 Base) MCG/ACT inhaler Inhale 1-2 puffs into the lungs every 6 (six) hours as needed for  wheezing or shortness of breath. Patient not taking: Reported on 05/15/2021 09/21/19   Wieters, Madelynn Done C, PA-C  ALPRAZolam (XANAX) 0.25 MG tablet TAKE 1 TABLET BY MOUTH TWICE A DAY AS NEEDED FOR ANXIETY 08/25/18   Lucianne Lei, MD  amLODipine (NORVASC) 10 MG tablet Take 10 mg by mouth daily. 02/23/17   [provider]  Ascorbic Acid (VITAMIN C PO) Take by mouth.    [provider]  celecoxib (CELEBREX) 200 MG capsule Take 1 capsule (200 mg total) by mouth 2 (two) times daily. 10/20/19   Margarita Mail, PA-C  cyclobenzaprine (FLEXERIL) 10 MG tablet Take 0.5-1 tablets (5-10 mg total) by mouth 2 (two) times daily as needed for muscle spasms. 10/20/19   Margarita Mail, PA-C  diclofenac Sodium (VOLTAREN) 1 % GEL Apply 4 g topically 4 (four)  times daily. 07/30/20   Fondaw, Wylder S, PA  ELDERBERRY PO Take by mouth. Patient not taking: Reported on 05/15/2021    [provider]  fluticasone (FLONASE) 50 MCG/ACT nasal spray Place 2 sprays into both nostrils daily. Patient not taking: Reported on 05/15/2021 05/12/18   Ok Edwards, PA-C  hydrochlorothiazide (HYDRODIURIL) 12.5 MG tablet Take 2 tablets (25 mg total) by mouth daily. Patient taking differently: Take 12.5 mg by mouth daily. 05/14/18   Scot Jun, FNP  meclizine (ANTIVERT) 12.5 MG tablet Take 1 tablet (12.5 mg total) by mouth 3 (three) times daily as needed for dizziness. Patient not taking: Reported on 05/15/2021 03/25/21   Hughie Closs, PA-C  meloxicam (MOBIC) 7.5 MG tablet Take 1 tablet (7.5 mg total) by mouth daily. Patient not taking: Reported on 05/15/2021 04/19/20   Jaynee Eagles, PA-C  metFORMIN (GLUCOPHAGE) 500 MG tablet Take 1 tablet (500 mg total) by mouth 2 (two) times daily with a meal. Patient taking differently: Take 500 mg by mouth daily. 06/19/17   Lavonia Drafts, MD  methocarbamol (ROBAXIN) 500 MG tablet Take 1 tablet (500 mg total) by mouth 2 (two) times daily. 07/30/20   Tedd Sias, PA  Multiple Vitamins-Minerals (MULTIVITAMIN PO) Take 1 tablet by mouth daily. Patient not taking: Reported on 05/15/2021    [provider]  ondansetron (ZOFRAN) 4 MG tablet Take 1 tablet (4 mg total) by mouth every 6 (six) hours. 04/06/20   Hughie Closs, PA-C  pantoprazole (PROTONIX) 20 MG tablet TAKE 1 TABLET BY MOUTH EVERY DAY 12/29/19   Nicholas Lose, MD  Semaglutide (RYBELSUS) 7 MG TABS Take by mouth.    [provider]  tamoxifen (NOLVADEX) 20 MG tablet TAKE 1 TABLET BY MOUTH EVERY DAY 05/15/21   Nicholas Lose, MD  tamoxifen (NOLVADEX) 20 MG tablet Take 1 tablet (20 mg total) by mouth daily. Patient not taking: Reported on 05/15/2021 05/15/21   Nicholas Lose, MD  tiZANidine (ZANAFLEX) 4 MG tablet Take 1 tablet (4 mg total) by mouth  at bedtime. 04/19/20   Jaynee Eagles, PA-C  traMADol (ULTRAM) 50 MG tablet SMARTSIG:1 Tablet(s) By Mouth Every 12 Hours PRN 10/26/19   [provider]  Vitamin D, Ergocalciferol, (DRISDOL) 1.25 MG (50000 UNIT) CAPS capsule Take 50,000 Units by mouth once a week. 10/26/19   [provider]    Family History Family History  Problem Relation Age of Onset   Hypertension Mother    Diabetes Mother    COPD Mother    Breast cancer Paternal Grandmother        dx in her 42s   Prostate cancer Paternal Grandfather  Prostate cancer Father 25   Prostate cancer Paternal Uncle    Breast cancer Other        paternal grandmother's sister   Breast cancer Paternal Aunt    Anesthesia problems Neg Hx    Amblyopia Neg Hx    Blindness Neg Hx    Cataracts Neg Hx    Glaucoma Neg Hx    Macular degeneration Neg Hx    Retinal detachment Neg Hx    Strabismus Neg Hx    Retinitis pigmentosa Neg Hx     Social History Social History   Tobacco Use   Smoking status: Never   Smokeless tobacco: Never  Vaping Use   Vaping Use: Never used  Substance Use Topics   Alcohol use: Yes    Alcohol/week: 1.0 standard drink    Types: 1 Cans of beer per week    Comment: occa   Drug use: No     Allergies   Shrimp extract allergy skin test, Shrimp [shellfish allergy], and Aleve [naproxen]   Review of Systems Review of Systems  Respiratory:  Positive for cough.     Physical Exam Triage Vital Signs ED Triage Vitals  Enc Vitals Group     BP 05/24/21 2000 (!) 126/95     Pulse Rate 05/24/21 2000 (!) 104     Resp 05/24/21 2000 20     Temp 05/24/21 2000 98.4 F (36.9 C)     Temp Source 05/24/21 2000 Oral     SpO2 05/24/21 2000 95 %     Weight --      Height --      Head Circumference --      Peak Flow --      Pain Score 05/24/21 1958 8     Pain Loc --      Pain Edu? --      Excl. in [place]? --    No data found.  Updated Vital Signs BP (!) 126/95 (BP Location: Right Arm)    Pulse (!) 104     Temp 98.4 F (36.9 C) (Oral)    Resp 20    LMP 05/27/2017 (Approximate)    SpO2 95%   Visual Acuity Right Eye Distance:   Left Eye Distance:   Bilateral Distance:    Right Eye Near:   Left Eye Near:    Bilateral Near:     Physical Exam Vitals reviewed.  Constitutional:      General: She is not in acute distress.    Appearance: She is not ill-appearing, toxic-appearing or diaphoretic.  HENT:     Right Ear: Tympanic membrane normal.     Left Ear: Tympanic membrane normal.     Nose: Nose normal.     Mouth/Throat:     Mouth: Mucous membranes are moist.     Pharynx: No oropharyngeal exudate or posterior oropharyngeal erythema.  Eyes:     Extraocular Movements: Extraocular movements intact.     Conjunctiva/sclera: Conjunctivae normal.     Pupils: Pupils are equal, round, and reactive to light.  Cardiovascular:     Rate and Rhythm: Normal rate and regular rhythm.     Heart sounds: No murmur heard. Pulmonary:     Effort: Pulmonary effort is normal.     Breath sounds: Normal breath sounds. No stridor. No wheezing, rhonchi or rales.  Musculoskeletal:     Cervical back: Neck supple.  Lymphadenopathy:     Cervical: No cervical adenopathy.  Skin:    Capillary Refill: Capillary refill  takes less than 2 seconds.     Coloration: Skin is not jaundiced or pale.  Neurological:     Mental Status: She is oriented to person, place, and time.  Psychiatric:        Behavior: Behavior normal.     UC Treatments / Results  Labs (all labs ordered are listed, but only abnormal results are displayed) Labs Reviewed  SARS CORONAVIRUS 2 (TAT 6-24 HRS)  POC INFLUENZA A AND B ANTIGEN (URGENT CARE ONLY)    EKG   Radiology DG Chest 2 View  Result Date: 05/24/2021 CLINICAL DATA:  Dyspnea and cough. Congestion and chills for 3 days. EXAM: CHEST - 2 VIEW COMPARISON:  Radiographs 01/25/2020 and 04/16/2019. FINDINGS: The heart size and mediastinal contours are normal. The lungs are clear. There is  no pleural effusion or pneumothorax. No acute osseous findings are identified. Mild thoracic spondylosis noted. IMPRESSION: Stable chest.  No evidence of active cardiopulmonary process. Electronically Signed   By: Richardean Sale M.D.   On: 05/24/2021 20:30    Procedures Procedures (including critical care time)  Medications Ordered in UC Medications - No data to display  Initial Impression / Assessment and Plan / UC Course  I have reviewed the triage vital signs and the nursing notes.  Pertinent labs & imaging results that were available during my care of the patient were reviewed by me and considered in my medical decision making (see chart for details).     Chest x-ray is negative for infiltrate or fluid.  Flu test is negative. Patient will allow [place] to swab her, though she states "I just do not think that is what this is"  If she is positive she has diabetes and obesity, and would be at high risk for severe COVID disease.  Last renal function in 2021 was normal and receive Paxlovid prescription if her COVID test is positive Final Clinical Impressions(s) / UC Diagnoses   Final diagnoses:  Viral URI with cough     Discharge Instructions      Your chest x-ray did not show a pneumonia or fluid  Flu test was negative  You can take benzonatate 100 mg 1 capsule 3 times daily as needed for cough  You have been swabbed for COVID, and the test will result in the next 24 hours. Our staff will call you if positive. If the test is positive, you should quarantine for 5 days.   Rapid COVID test have a chance of being falsely negative      ED Prescriptions     Medication Sig Dispense Auth. Provider   benzonatate (TESSALON) 100 MG capsule Take 1 capsule (100 mg total) by mouth 3 (three) times daily as needed for cough. 21 capsule Barrett Henle, MD      PDMP not reviewed this encounter.   Barrett Henle, MD 05/24/21 2043    Barrett Henle, MD 05/24/21 [phone]

## 2021-05-24 NOTE — Discharge Instructions (Addendum)
Your chest x-ray did not show a pneumonia or fluid ? ?Flu test was negative ? ?You can take benzonatate 100 mg 1 capsule 3 times daily as needed for cough ? ?You have been swabbed for COVID, and the test will result in the next 24 hours. Our staff will call you if positive. If the test is positive, you should quarantine for 5 days.  ? ?Rapid COVID test have a chance of being falsely negative ? ?

## 2021-05-25 ENCOUNTER — Telehealth (HOSPITAL_COMMUNITY): Payer: Self-pay | Admitting: Emergency Medicine

## 2021-05-25 LAB — SARS CORONAVIRUS 2 (TAT 6-24 HRS): SARS Coronavirus 2: POSITIVE — AB

## 2021-05-25 MED ORDER — NIRMATRELVIR/RITONAVIR (PAXLOVID)TABLET: 3.0000 | ORAL_TABLET | Freq: Two times a day (BID) | ORAL | 0 refills | Status: AC

## 2021-05-25 MED ORDER — NIRMATRELVIR/RITONAVIR (PAXLOVID)TABLET: 3.0000 | ORAL_TABLET | Freq: Two times a day (BID) | ORAL | 0 refills | Status: DC

## 2021-05-25 NOTE — Telephone Encounter (Signed)
Error with previous prescription, resending ?

## 2021-09-18 ENCOUNTER — Encounter (HOSPITAL_COMMUNITY): Payer: Self-pay | Admitting: Emergency Medicine

## 2021-09-18 ENCOUNTER — Ambulatory Visit (HOSPITAL_COMMUNITY)
Admission: EM | Admit: 2021-09-18 | Discharge: 2021-09-18 | Disposition: A | Discharge status: Home / Self Care | Payer: Medicaid Other | Attending: Physician Assistant | Admitting: Physician Assistant

## 2021-09-18 DIAGNOSIS — H00015 Hordeolum externum left lower eyelid: Secondary | ICD-10-CM

## 2021-09-18 DIAGNOSIS — J069 Acute upper respiratory infection, unspecified: Secondary | ICD-10-CM | POA: Diagnosis not present

## 2021-09-18 MED ORDER — ERYTHROMYCIN 5 MG/GM OP OINT: TOPICAL_OINTMENT | OPHTHALMIC | 0 refills | Status: DC

## 2021-09-18 NOTE — ED Provider Notes (Signed)
[facility]    CSN: [id number] Arrival date & time: 09/18/21  1634      History   Chief Complaint Chief Complaint  Patient presents with   Eye Problem    HPI Sheryl Cox is a 51 y.o. female.   Pt complains of left eye redness and pain that started three days ago.  Denies injury.  Does not wear contacts or glasses.  She has also been experiencing a mild intermittent cough.  She has applied wet paper towel to her eye with minimal relief.  She reports mild drainage.     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   Nabothian cyst 12/07/2011   Cervical mass 11/20/2011   Fibroid uterus 11/20/2011    Past Surgical History:  Procedure Laterality Date   BIOPSY BREAST Left 12/05/12   fibroadenoma   BREAST LUMPECTOMY     2014   BREAST SURGERY Right    CESAREAN SECTION  01/05/2009   CHOLECYSTECTOMY N/A 01/31/2016   Procedure: LAPAROSCOPIC CHOLECYSTECTOMY;  Surgeon: Erroll Luna, MD;  Location: [facility];  Service: General;  Laterality: N/A;   DILITATION & CURRETTAGE/HYSTROSCOPY WITH HYDROTHERMAL ABLATION N/A 08/12/2013   Procedure: DILATATION & CURETTAGE/HYSTEROSCOPY WITH HYDROTHERMAL ABLATION;  Surgeon: Frederico Hamman, MD;  Location: [facility];  Service: Gynecology;  Laterality: N/A;   ROBOTIC ASSISTED TOTAL HYSTERECTOMY N/A 06/18/2017   Procedure: XI ROBOTIC ASSISTED TOTAL HYSTERECTOMY;   Surgeon: Lavonia Drafts, MD;  Location: [facility];  Service: Gynecology;  Laterality: N/A;   SALPINGOOPHORECTOMY Bilateral 06/18/2017   Procedure: BILATERAL SALPINGO OOPHORECTOMY;  Surgeon: Lavonia Drafts, MD;  Location: [facility];  Service: Gynecology;  Laterality: Bilateral;    OB History     Gravida  1   Para  1   Term  1   Preterm  0   AB  0   Living  1      SAB  0   IAB  0   Ectopic  0   Multiple  0   Live Births  1            Home Medications    Prior to Admission medications   Medication Sig Start Date End Date Taking? Authorizing Provider  ALPRAZolam (XANAX) 0.25 MG tablet TAKE 1 TABLET BY MOUTH TWICE A DAY AS NEEDED FOR ANXIETY 08/25/18  Yes Lucianne Lei, MD  amLODipine (NORVASC) 10 MG tablet Take 10 mg by mouth daily. 02/23/17  Yes [provider]  Ascorbic Acid (VITAMIN C PO) Take by mouth.   Yes [provider]  erythromycin ophthalmic ointment Place a 1/2 inch ribbon of ointment into the lower eyelid. 09/18/21  Yes Ward, Lenise Arena, PA-C  hydrochlorothiazide (HYDRODIURIL) 12.5 MG tablet Take 2 tablets (25 mg total) by mouth daily. Patient taking differently: Take 12.5 mg by mouth daily. 05/14/18  Yes Scot Jun, FNP  ondansetron (ZOFRAN) 4 MG tablet Take 1 tablet (4 mg total) by mouth every 6 (six) hours. 04/06/20  Yes Covington, Sarah M, PA-C  tamoxifen (NOLVADEX) 20 MG tablet TAKE 1 TABLET BY MOUTH EVERY DAY 05/15/21  Yes Nicholas Lose, MD  tiZANidine (ZANAFLEX) 4 MG tablet Take 1 tablet (4 mg total) by mouth at bedtime. 04/19/20  Yes Jaynee Eagles, PA-C  Vitamin D, Ergocalciferol, (DRISDOL) 1.25 MG (50000 UNIT) CAPS capsule Take 50,000 Units by mouth once a week. 10/26/19  Yes [provider]  acetaminophen (TYLENOL) 325 MG tablet Take 650 mg by mouth daily as needed for headache. Patient not taking: Reported on 05/15/2021    [provider]  albuterol (VENTOLIN HFA) 108 (90 Base) MCG/ACT inhaler Inhale 1-2  puffs into the lungs every 6 (six) hours as needed for wheezing or shortness of breath. Patient not taking: Reported on 05/15/2021 09/21/19   Wieters, Hallie C, PA-C  benzonatate (TESSALON) 100 MG capsule Take 1 capsule (100 mg total) by mouth 3 (three) times daily as needed for cough. 05/24/21   Barrett Henle, MD  celecoxib (CELEBREX) 200 MG capsule Take 1 capsule (200 mg total) by mouth 2 (two) times daily. 10/20/19   Margarita Mail, PA-C  cyclobenzaprine (FLEXERIL) 10 MG tablet Take 0.5-1 tablets (5-10 mg total) by mouth 2 (two) times daily as needed for muscle spasms. 10/20/19   Margarita Mail, PA-C  diclofenac Sodium (VOLTAREN) 1 % GEL Apply 4 g topically 4 (four) times daily. 07/30/20   Fondaw, Wylder S, PA  ELDERBERRY PO Take by mouth. Patient not taking: Reported on 05/15/2021    [provider]  fluticasone (FLONASE) 50 MCG/ACT nasal spray Place 2 sprays into both nostrils daily. Patient not taking: Reported on 05/15/2021 05/12/18   Ok Edwards, PA-C  meclizine (ANTIVERT) 12.5 MG tablet Take 1 tablet (12.5 mg total) by mouth 3 (three) times daily as needed for dizziness. Patient not taking: Reported on 05/15/2021 03/25/21   Hughie Closs, PA-C  meloxicam (MOBIC) 7.5 MG tablet Take 1 tablet (7.5 mg total) by mouth daily. Patient not taking: Reported on 05/15/2021 04/19/20   Jaynee Eagles, PA-C  metFORMIN (GLUCOPHAGE) 500 MG tablet Take 1 tablet (500 mg total) by mouth 2 (two) times daily with a meal. Patient taking differently: Take 500 mg by mouth daily. 06/19/17   Lavonia Drafts, MD  methocarbamol (ROBAXIN) 500 MG tablet Take 1 tablet (500 mg total) by mouth 2 (two) times daily. 07/30/20   Tedd Sias, PA  Multiple Vitamins-Minerals (MULTIVITAMIN PO) Take 1 tablet by mouth daily. Patient not taking: Reported on 05/15/2021    [provider]  pantoprazole (PROTONIX) 20 MG tablet TAKE 1 TABLET BY MOUTH EVERY DAY 12/29/19   Nicholas Lose, MD  Semaglutide (RYBELSUS) 7 MG  TABS Take by mouth.    [provider]  tamoxifen (NOLVADEX) 20 MG tablet Take 1 tablet (20 mg total) by mouth daily. Patient not taking: Reported on 05/15/2021 05/15/21   Nicholas Lose, MD  traMADol Veatrice Bourbon) 50 MG tablet SMARTSIG:1 Tablet(s) By Mouth Every 12 Hours PRN 10/26/19   [provider]    Family History Family History  Problem Relation Age of Onset   Hypertension Mother    Diabetes Mother    COPD Mother    Breast cancer Paternal Grandmother        dx in her 92s   Prostate cancer Paternal Grandfather    Prostate cancer Father 83  Prostate cancer Paternal Uncle    Breast cancer Other        paternal grandmother's sister   Breast cancer Paternal Aunt    Anesthesia problems Neg Hx    Amblyopia Neg Hx    Blindness Neg Hx    Cataracts Neg Hx    Glaucoma Neg Hx    Macular degeneration Neg Hx    Retinal detachment Neg Hx    Strabismus Neg Hx    Retinitis pigmentosa Neg Hx     Social History Social History   Tobacco Use   Smoking status: Never   Smokeless tobacco: Never  Vaping Use   Vaping Use: Never used  Substance Use Topics   Alcohol use: Yes    Alcohol/week: 1.0 standard drink of alcohol    Types: 1 Cans of beer per week    Comment: occa   Drug use: No     Allergies   Shrimp extract allergy skin test, Shrimp [shellfish allergy], and Aleve [naproxen]   Review of Systems Review of Systems  Constitutional:  Negative for chills and fever.  HENT:  Negative for ear pain and sore throat.   Eyes:  Positive for pain and redness. Negative for visual disturbance.  Respiratory:  Positive for cough. Negative for shortness of breath.   Cardiovascular:  Negative for chest pain and palpitations.  Gastrointestinal:  Negative for abdominal pain and vomiting.  Genitourinary:  Negative for dysuria and hematuria.  Musculoskeletal:  Negative for arthralgias and back pain.  Skin:  Negative for color change and rash.  Neurological:  Negative for seizures  and syncope.  All other systems reviewed and are negative.    Physical Exam Triage Vital Signs ED Triage Vitals  Enc Vitals Group     BP 09/18/21 1722 (!) 157/98     Pulse Rate 09/18/21 1722 (!) 104     Resp 09/18/21 1722 18     Temp 09/18/21 1722 100.1 F (37.8 C)     Temp Source 09/18/21 1722 Oral     SpO2 09/18/21 1722 91 %     Weight 09/18/21 1724 262 lb (118.8 kg)     Height 09/18/21 1724 '5\' 3"'$  (1.6 m)     Head Circumference --      Peak Flow --      Pain Score 09/18/21 1724 9     Pain Loc --      Pain Edu? --      Excl. in [place]? --    No data found.  Updated Vital Signs BP (!) 157/98 (BP Location: Left Arm)   Pulse (!) 104   Temp 100.1 F (37.8 C) (Oral)   Resp 18   Ht '5\' 3"'$  (1.6 m)   Wt 262 lb (118.8 kg)   LMP 05/27/2017 (Approximate)   SpO2 91%   BMI 46.41 kg/m   Visual Acuity Right Eye Distance:   Left Eye Distance:   Bilateral Distance:    Right Eye Near:   Left Eye Near:    Bilateral Near:     Physical Exam Vitals and nursing note reviewed.  Constitutional:      General: She is not in acute distress.    Appearance: She is well-developed.  HENT:     Head: Normocephalic and atraumatic.  Eyes:     Conjunctiva/sclera: Conjunctivae normal.   Cardiovascular:     Rate and Rhythm: Normal rate and regular rhythm.     Heart sounds: No murmur heard. Pulmonary:     Effort:  Pulmonary effort is normal. No respiratory distress.     Breath sounds: Normal breath sounds.  Abdominal:     Palpations: Abdomen is soft.     Tenderness: There is no abdominal tenderness.  Musculoskeletal:        General: No swelling.     Cervical back: Neck supple.  Skin:    General: Skin is warm and dry.     Capillary Refill: Capillary refill takes less than 2 seconds.  Neurological:     Mental Status: She is alert.  Psychiatric:        Mood and Affect: Mood normal.      UC Treatments / Results  Labs (all labs ordered are listed, but only abnormal results are  displayed) Labs Reviewed - No data to display  EKG   Radiology No results found.  Procedures Procedures (including critical care time)  Medications Ordered in UC Medications - No data to display  Initial Impression / Assessment and Plan / UC Course  I have reviewed the triage vital signs and the nursing notes.  Pertinent labs & imaging results that were available during my care of the patient were reviewed by me and considered in my medical decision making (see chart for details).     External hordeolum.  Pt does have a mild cough, viral illness sx.  She has had some drainage and conjunctivitis.  Will send in antibiotic ointment to use if bacterial conjunctivitis as well.  Supportive care discussed. Return precautions given.  Final Clinical Impressions(s) / UC Diagnoses   Final diagnoses:  Hordeolum externum of left lower eyelid  Viral URI with cough     Discharge Instructions      Recommend Delsym for cough and Mucinex Can apply warm compress to left eye for relief Drink plenty of fluids, rest.  Return if symptoms become worse.    ED Prescriptions     Medication Sig Dispense Auth. Provider   erythromycin ophthalmic ointment Place a 1/2 inch ribbon of ointment into the lower eyelid. 3.5 g Ward, Lenise Arena, PA-C      PDMP not reviewed this encounter.   Ward, Lenise Arena, PA-C 09/18/21 [phone]

## 2021-09-18 NOTE — Discharge Instructions (Addendum)
Recommend Delsym for cough and Mucinex Can apply warm compress to left eye for relief Drink plenty of fluids, rest.  Return if symptoms become worse.

## 2021-09-18 NOTE — ED Triage Notes (Signed)
Patient c/o left eye redness and pain x 3 days.  No apparent injury and does not wear any contact lenses.  Patient has been applying wet paper towel to eye.

## 2021-09-22 ENCOUNTER — Emergency Department (HOSPITAL_BASED_OUTPATIENT_CLINIC_OR_DEPARTMENT_OTHER): Payer: Medicaid Other

## 2021-09-22 ENCOUNTER — Other Ambulatory Visit: Payer: Self-pay

## 2021-09-22 ENCOUNTER — Encounter (HOSPITAL_BASED_OUTPATIENT_CLINIC_OR_DEPARTMENT_OTHER): Payer: Self-pay

## 2021-09-22 ENCOUNTER — Emergency Department (HOSPITAL_BASED_OUTPATIENT_CLINIC_OR_DEPARTMENT_OTHER)
Admission: EM | Admit: 2021-09-22 | Discharge: 2021-09-22 | Disposition: A | Discharge status: Home / Self Care | Payer: Medicaid Other | Attending: Emergency Medicine | Admitting: Emergency Medicine

## 2021-09-22 DIAGNOSIS — M79604 Pain in right leg: Secondary | ICD-10-CM

## 2021-09-22 DIAGNOSIS — M79661 Pain in right lower leg: Secondary | ICD-10-CM | POA: Diagnosis not present

## 2021-09-22 NOTE — ED Provider Notes (Signed)
[facility] Provider Note   CSN: [id number] Arrival date & time: 09/22/21  1026     History  Chief Complaint  Patient presents with   Insect Bite    [facility] is a 51 y.o. female.  Patient here with pain in the right hamstring since getting out this morning.  She is not sure if she was bit by a bug or something.  She is not sure if area is swollen.  She has been feeling a sharpness in the right hamstring since this morning.  On and off.  Feels slightly point tender with a constant stinging feeling.  Nothing has made it worse or better.  No significant medical history.  No recent surgery or travel.  No history of blood clots.  Denies any numbness, weakness, chills.  The history is provided by the patient.       Home Medications Prior to Admission medications   Medication Sig Start Date End Date Taking? Authorizing Provider  acetaminophen (TYLENOL) 325 MG tablet Take 650 mg by mouth daily as needed for headache. Patient not taking: Reported on 05/15/2021    [provider]  albuterol (VENTOLIN HFA) 108 (90 Base) MCG/ACT inhaler Inhale 1-2 puffs into the lungs every 6 (six) hours as needed for wheezing or shortness of breath. Patient not taking: Reported on 05/15/2021 09/21/19   Wieters, Madelynn Done C, PA-C  ALPRAZolam (XANAX) 0.25 MG tablet TAKE 1 TABLET BY MOUTH TWICE A DAY AS NEEDED FOR ANXIETY 08/25/18   Lucianne Lei, MD  amLODipine (NORVASC) 10 MG tablet Take 10 mg by mouth daily. 02/23/17   [provider]  Ascorbic Acid (VITAMIN C PO) Take by mouth.    [provider]  benzonatate (TESSALON) 100 MG capsule Take 1 capsule (100 mg total) by mouth 3 (three) times daily as needed for cough. 05/24/21   Barrett Henle, MD  celecoxib (CELEBREX) 200 MG capsule Take 1 capsule (200 mg total) by mouth 2 (two) times daily. 10/20/19   Margarita Mail, PA-C  cyclobenzaprine (FLEXERIL) 10 MG tablet Take 0.5-1 tablets (5-10 mg total) by mouth  2 (two) times daily as needed for muscle spasms. 10/20/19   Margarita Mail, PA-C  diclofenac Sodium (VOLTAREN) 1 % GEL Apply 4 g topically 4 (four) times daily. 07/30/20   Fondaw, Wylder S, PA  ELDERBERRY PO Take by mouth. Patient not taking: Reported on 05/15/2021    [provider]  erythromycin ophthalmic ointment Place a 1/2 inch ribbon of ointment into the lower eyelid. 09/18/21   Ward, Lenise Arena, PA-C  fluticasone (FLONASE) 50 MCG/ACT nasal spray Place 2 sprays into both nostrils daily. Patient not taking: Reported on 05/15/2021 05/12/18   Ok Edwards, PA-C  hydrochlorothiazide (HYDRODIURIL) 12.5 MG tablet Take 2 tablets (25 mg total) by mouth daily. Patient taking differently: Take 12.5 mg by mouth daily. 05/14/18   Scot Jun, FNP  meclizine (ANTIVERT) 12.5 MG tablet Take 1 tablet (12.5 mg total) by mouth 3 (three) times daily as needed for dizziness. Patient not taking: Reported on 05/15/2021 03/25/21   Hughie Closs, PA-C  meloxicam (MOBIC) 7.5 MG tablet Take 1 tablet (7.5 mg total) by mouth daily. Patient not taking: Reported on 05/15/2021 04/19/20   Jaynee Eagles, PA-C  metFORMIN (GLUCOPHAGE) 500 MG tablet Take 1 tablet (500 mg total) by mouth 2 (two) times daily with a meal. Patient taking differently: Take 500 mg by mouth daily. 06/19/17   Lavonia Drafts, MD  methocarbamol (ROBAXIN)  500 MG tablet Take 1 tablet (500 mg total) by mouth 2 (two) times daily. 07/30/20   Tedd Sias, PA  Multiple Vitamins-Minerals (MULTIVITAMIN PO) Take 1 tablet by mouth daily. Patient not taking: Reported on 05/15/2021    [provider]  ondansetron (ZOFRAN) 4 MG tablet Take 1 tablet (4 mg total) by mouth every 6 (six) hours. 04/06/20   Hughie Closs, PA-C  pantoprazole (PROTONIX) 20 MG tablet TAKE 1 TABLET BY MOUTH EVERY DAY 12/29/19   Nicholas Lose, MD  Semaglutide (RYBELSUS) 7 MG TABS Take by mouth.    [provider]  tamoxifen (NOLVADEX) 20 MG tablet TAKE 1  TABLET BY MOUTH EVERY DAY 05/15/21   Nicholas Lose, MD  tamoxifen (NOLVADEX) 20 MG tablet Take 1 tablet (20 mg total) by mouth daily. Patient not taking: Reported on 05/15/2021 05/15/21   Nicholas Lose, MD  tiZANidine (ZANAFLEX) 4 MG tablet Take 1 tablet (4 mg total) by mouth at bedtime. 04/19/20   Jaynee Eagles, PA-C  traMADol (ULTRAM) 50 MG tablet SMARTSIG:1 Tablet(s) By Mouth Every 12 Hours PRN 10/26/19   [provider]  Vitamin D, Ergocalciferol, (DRISDOL) 1.25 MG (50000 UNIT) CAPS capsule Take 50,000 Units by mouth once a week. 10/26/19   [provider]      Allergies    Shrimp extract allergy skin test, Shrimp [shellfish allergy], and Aleve [naproxen]    Review of Systems   Review of Systems  Physical Exam Updated Vital Signs BP (!) 164/102   Pulse (!) 103   Temp 98.4 F (36.9 C)   Resp 16   LMP 05/27/2017 (Approximate)   SpO2 100%  Physical Exam Vitals and nursing note reviewed.  Constitutional:      General: She is not in acute distress.    Appearance: She is well-developed.  HENT:     Head: Normocephalic and atraumatic.  Eyes:     Conjunctiva/sclera: Conjunctivae normal.  Cardiovascular:     Rate and Rhythm: Normal rate and regular rhythm.     Pulses: Normal pulses.     Heart sounds: No murmur heard. Pulmonary:     Effort: Pulmonary effort is normal. No respiratory distress.     Breath sounds: Normal breath sounds.  Abdominal:     Palpations: Abdomen is soft.     Tenderness: There is no abdominal tenderness.  Musculoskeletal:        General: Tenderness present. No swelling.     Cervical back: Neck supple.     Comments: Tender in the right posterior hamstring  Skin:    General: Skin is warm and dry.     Capillary Refill: Capillary refill takes less than 2 seconds.     Findings: No erythema.  Neurological:     General: No focal deficit present.     Mental Status: She is alert.     Sensory: No sensory deficit.     Motor: No weakness.  Psychiatric:         Mood and Affect: Mood normal.     ED Results / Procedures / Treatments   Labs (all labs ordered are listed, but only abnormal results are displayed) Labs Reviewed - No data to display  EKG None  Radiology US Venous Img Lower Unilateral Right  Result Date: 09/22/2021 CLINICAL DATA:  51 year old female with hip pain posterior thigh EXAM: RIGHT LOWER EXTREMITY VENOUS DOPPLER ULTRASOUND TECHNIQUE: Gray-scale sonography with graded compression, as well as color Doppler and duplex ultrasound were performed to evaluate the lower  extremity deep venous systems from the level of the common femoral vein and including the common femoral, femoral, profunda femoral, popliteal and calf veins including the posterior tibial, peroneal and gastrocnemius veins when visible. The superficial great saphenous vein was also interrogated. Spectral Doppler was utilized to evaluate flow at rest and with distal augmentation maneuvers in the common femoral, femoral and popliteal veins. COMPARISON:  None Available. FINDINGS: Contralateral Common Femoral Vein: Respiratory phasicity is normal and symmetric with the symptomatic side. No evidence of thrombus. Normal compressibility. Common Femoral Vein: No evidence of thrombus. Normal compressibility, respiratory phasicity and response to augmentation. Saphenofemoral Junction: No evidence of thrombus. Normal compressibility and flow on color Doppler imaging. Profunda Femoral Vein: No evidence of thrombus. Normal compressibility and flow on color Doppler imaging. Femoral Vein: No evidence of thrombus. Normal compressibility, respiratory phasicity and response to augmentation. Popliteal Vein: No evidence of thrombus. Normal compressibility, respiratory phasicity and response to augmentation. Calf Veins: Limited visualization of posterior tibial vein and peroneal vein Superficial Great Saphenous Vein: No evidence of thrombus. Normal compressibility and flow on color Doppler  imaging. Other Findings:  None. IMPRESSION: Directed duplex of the right lower extremity negative for DVT Signed, Dulcy Fanny. Nadene Rubins, RPVI [facility] Radiology Electronically Signed   By: Corrie Mckusick D.O.   On: 09/22/2021 13:45    Procedures Procedures    Medications Ordered in ED Medications - No data to display  ED Course/ Medical Decision Making/ A&P                           Medical Decision Making  DAYLAN BOGGESS is here with pain in her right hamstring.  She thinks that she was stung by an insect.  However I do not see any redness or swelling or irritation.  She has normal vitals.  No fever.  Neurovascular neurovascularly she is intact.  She is got good pulses in her lower extremities.  No obvious leg swelling.  Have low suspicion for DVT but will get ultrasound to rule that out.  Overall suspect that this is likely musculoskeletal.  I have no concern for infectious process at this time.  DVT study is negative.  Suspect muscular issue.  Discharged in good condition.  Recommend follow-up with primary care doctor.  This chart was dictated using voice recognition software.  Despite best efforts to proofread,  errors can occur which can change the documentation meaning.         Final Clinical Impression(s) / ED Diagnoses Final diagnoses:  Pain of right lower extremity    Rx / DC Orders ED Discharge Orders     None         Lennice Sites, DO 09/22/21 1349

## 2021-09-22 NOTE — Discharge Instructions (Signed)
There is no evidence of blood clot in your leg.  Overall suspect that this is a muscular issue.  Suspect that this will resolve on its own.

## 2021-09-22 NOTE — ED Triage Notes (Signed)
Pt presents POV for a possible bug bite, states she felt a sting in the back of her Right poster thigh while getting out of bed. Pt reports a constant sting, denies any itching. Unsure if its swollen or red d/t the location
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
## 2021-12-04 ENCOUNTER — Ambulatory Visit
Admission: RE | Admit: 2021-12-04 | Discharge: 2021-12-04 | Disposition: A | Discharge status: Home / Self Care | Payer: Medicaid Other | Source: Ambulatory Visit | Attending: Family Medicine | Admitting: Family Medicine

## 2021-12-04 ENCOUNTER — Other Ambulatory Visit: Payer: Self-pay | Admitting: Family Medicine

## 2021-12-04 DIAGNOSIS — M79672 Pain in left foot: Secondary | ICD-10-CM

## 2022-01-02 ENCOUNTER — Ambulatory Visit: Payer: Medicaid Other | Admitting: Podiatry

## 2022-01-02 ENCOUNTER — Ambulatory Visit (INDEPENDENT_AMBULATORY_CARE_PROVIDER_SITE_OTHER): Payer: Medicaid Other

## 2022-01-02 DIAGNOSIS — B353 Tinea pedis: Secondary | ICD-10-CM | POA: Diagnosis not present

## 2022-01-02 DIAGNOSIS — M722 Plantar fascial fibromatosis: Secondary | ICD-10-CM

## 2022-01-02 DIAGNOSIS — M7672 Peroneal tendinitis, left leg: Secondary | ICD-10-CM

## 2022-01-02 MED ORDER — METHYLPREDNISOLONE 4 MG PO TBPK: ORAL_TABLET | ORAL | 0 refills | Status: DC

## 2022-01-02 MED ORDER — FLUCONAZOLE 150 MG PO TABS: 150.0000 mg | ORAL_TABLET | ORAL | 0 refills | Status: DC

## 2022-01-02 NOTE — Patient Instructions (Addendum)
Call to schedule physical therapy: [facility] [address]  [phone]  Plantar Fasciitis (Heel Spur Syndrome) with Rehab The plantar fascia is a fibrous, ligament-like, soft-tissue structure that spans the bottom of the foot. Plantar fasciitis is a condition that causes pain in the foot due to inflammation of the tissue. SYMPTOMS  Pain and tenderness on the underneath side of the foot. Pain that worsens with standing or walking. CAUSES  Plantar fasciitis is caused by irritation and injury to the plantar fascia on the underneath side of the foot. Common mechanisms of injury include: Direct trauma to bottom of the foot. Damage to a small nerve that runs under the foot where the main fascia attaches to the heel bone. Stress placed on the plantar fascia due to bone spurs. RISK INCREASES WITH:  Activities that place stress on the plantar fascia (running, jumping, pivoting, or cutting). Poor strength and flexibility. Improperly fitted shoes. Tight calf muscles. Flat feet. Failure to warm-up properly before activity. Obesity. PREVENTION Warm up and stretch properly before activity. Allow for adequate recovery between workouts. Maintain physical fitness: Strength, flexibility, and endurance. Cardiovascular fitness. Maintain a health body weight. Avoid stress on the plantar fascia. Wear properly fitted shoes, including arch supports for individuals who have flat feet.  PROGNOSIS  If treated properly, then the symptoms of plantar fasciitis usually resolve without surgery. However, occasionally surgery is necessary.  RELATED COMPLICATIONS  Recurrent symptoms that may result in a chronic condition. Problems of the lower back that are caused by compensating for the injury, such as limping. Pain or weakness of the foot during push-off following surgery. Chronic inflammation, scarring, and partial or complete fascia  tear, occurring more often from repeated injections.  TREATMENT  Treatment initially involves the use of ice and medication to help reduce pain and inflammation. The use of strengthening and stretching exercises may help reduce pain with activity, especially stretches of the Achilles tendon. These exercises may be performed at home or with a therapist. Your caregiver may recommend that you use heel cups of arch supports to help reduce stress on the plantar fascia. Occasionally, corticosteroid injections are given to reduce inflammation. If symptoms persist for greater than 6 months despite non-surgical (conservative), then surgery may be recommended.   MEDICATION  If pain medication is necessary, then nonsteroidal anti-inflammatory medications, such as aspirin and ibuprofen, or other minor pain relievers, such as acetaminophen, are often recommended. Do not take pain medication within 7 days before surgery. Prescription pain relievers may be given if deemed necessary by your caregiver. Use only as directed and only as much as you need. Corticosteroid injections may be given by your caregiver. These injections should be reserved for the most serious cases, because they may only be given a certain number of times.  HEAT AND COLD Cold treatment (icing) relieves pain and reduces inflammation. Cold treatment should be applied for 10 to 15 minutes every 2 to 3 hours for inflammation and pain and immediately after any activity that aggravates your symptoms. Use ice packs or massage the area with a piece of ice (ice massage). Heat treatment may be used prior to performing the stretching and strengthening activities prescribed by your caregiver, physical therapist, or athletic trainer. Use a heat pack or soak the injury in warm water.  SEEK IMMEDIATE MEDICAL CARE IF: Treatment seems to offer no benefit, or the condition worsens. Any medications produce adverse side effects.  EXERCISES- RANGE OF MOTION  (  ROM) AND STRETCHING EXERCISES - Plantar Fasciitis (Heel Spur Syndrome) These exercises may help you when beginning to rehabilitate your injury. Your symptoms may resolve with or without further involvement from your physician, physical therapist or athletic trainer. While completing these exercises, remember:  Restoring tissue flexibility helps normal motion to return to the joints. This allows healthier, less painful movement and activity. An effective stretch should be held for at least 30 seconds. A stretch should never be painful. You should only feel a gentle lengthening or release in the stretched tissue.  RANGE OF MOTION - Toe Extension, Flexion Sit with your right / left leg crossed over your opposite knee. Grasp your toes and gently pull them back toward the top of your foot. You should feel a stretch on the bottom of your toes and/or foot. Hold this stretch for 10 seconds. Now, gently pull your toes toward the bottom of your foot. You should feel a stretch on the top of your toes and or foot. Hold this stretch for 10 seconds. Repeat  times. Complete this stretch 3 times per day.   RANGE OF MOTION - Ankle Dorsiflexion, Active Assisted Remove shoes and sit on a chair that is preferably not on a carpeted surface. Place right / left foot under knee. Extend your opposite leg for support. Keeping your heel down, slide your right / left foot back toward the chair until you feel a stretch at your ankle or calf. If you do not feel a stretch, slide your bottom forward to the edge of the chair, while still keeping your heel down. Hold this stretch for 10 seconds. Repeat 3 times. Complete this stretch 2 times per day.   STRETCH  Gastroc, Standing Place hands on wall. Extend right / left leg, keeping the front knee somewhat bent. Slightly point your toes inward on your back foot. Keeping your right / left heel on the floor and your knee straight, shift your weight toward the wall, not allowing  your back to arch. You should feel a gentle stretch in the right / left calf. Hold this position for 10 seconds. Repeat 3 times. Complete this stretch 2 times per day.  STRETCH  Soleus, Standing Place hands on wall. Extend right / left leg, keeping the other knee somewhat bent. Slightly point your toes inward on your back foot. Keep your right / left heel on the floor, bend your back knee, and slightly shift your weight over the back leg so that you feel a gentle stretch deep in your back calf. Hold this position for 10 seconds. Repeat 3 times. Complete this stretch 2 times per day.  STRETCH  Gastrocsoleus, Standing  Note: This exercise can place a lot of stress on your foot and ankle. Please complete this exercise only if specifically instructed by your caregiver.  Place the ball of your right / left foot on a step, keeping your other foot firmly on the same step. Hold on to the wall or a rail for balance. Slowly lift your other foot, allowing your body weight to press your heel down over the edge of the step. You should feel a stretch in your right / left calf. Hold this position for 10 seconds. Repeat this exercise with a slight bend in your right / left knee. Repeat 3 times. Complete this stretch 2 times per day.   STRENGTHENING EXERCISES - Plantar Fasciitis (Heel Spur Syndrome)  These exercises may help you when beginning to rehabilitate your injury. They may resolve your   symptoms with or without further involvement from your physician, physical therapist or athletic trainer. While completing these exercises, remember:  Muscles can gain both the endurance and the strength needed for everyday activities through controlled exercises. Complete these exercises as instructed by your physician, physical therapist or athletic trainer. Progress the resistance and repetitions only as guided.  STRENGTH - Towel Curls Sit in a chair positioned on a non-carpeted surface. Place your foot on a  towel, keeping your heel on the floor. Pull the towel toward your heel by only curling your toes. Keep your heel on the floor. Repeat 3 times. Complete this exercise 2 times per day.  STRENGTH - Ankle Inversion Secure one end of a rubber exercise band/tubing to a fixed object (table, pole). Loop the other end around your foot just before your toes. Place your fists between your knees. This will focus your strengthening at your ankle. Slowly, pull your big toe up and in, making sure the band/tubing is positioned to resist the entire motion. Hold this position for 10 seconds. Have your muscles resist the band/tubing as it slowly pulls your foot back to the starting position. Repeat 3 times. Complete this exercises 2 times per day.  Document Released: 03/05/2005 Document Revised: 05/28/2011 Document Reviewed: 06/17/2008 [employer] Patient Information 2014 [employer].  

## 2022-01-03 NOTE — Progress Notes (Signed)
  Subjective:  Patient ID: [facility], female    DOB: November 19, 1970,  MRN: [id number]  Chief Complaint  Patient presents with   Foot Pain    Room 2  Pt states she's had pain in both feet for a long time , she states the pain is mainly on the heels and the sides of the feet    51 y.o. female presents with the above complaint. History confirmed with patient.  Most the pain is in the bottom of the heel on both sides and on the left side it radiates to the outside of the ankle.  She also has dry itching scaling skin  Objective:  Physical Exam: warm, good capillary refill, no trophic changes or ulcerative lesions, normal DP and PT pulses, normal sensory exam, tinea pedis, and bilateral heel pain and peroneal pain on the left side, pain with resisted eversion.  Radiographs: Multiple views x-ray of both feet: no fracture, dislocation, swelling or degenerative changes noted, plantar calcaneal spur, and pes planus Assessment:   1. Plantar fasciitis, bilateral   2. Peroneal tendinitis, left   3. Tinea pedis of both feet      Plan:  Patient was evaluated and treated and all questions answered.  Discussed the etiology and treatment options for plantar fasciitis and peroneal tendinitis including stretching, formal physical therapy, supportive shoegears such as a running shoe or sneaker, pre fabricated orthoses, injection therapy, and oral medications. We also discussed the role of surgical treatment of this for patients who do not improve after exhausting non-surgical treatment options.   -XR reviewed with patient -Educated patient on stretching and icing of the affected limb -Rx for medrol pack. Educated on use, risks, and benefits of the medication -We discussed the option of corticosteroid injection for the heel which she declined -Recommended physical therapy and referral sent to [facility]   Discussed the etiology and treatment options for tinea pedis.  Discussed topical and oral  treatment.  Recommended oral treatment with Diflucan for 1 month.  This was sent to the patient's pharmacy.  Also discussed appropriate foot hygiene, use of antifungal spray such as Tinactin in shoes, as well as cleaning her foot surfaces such as showers and bathroom floors with bleach.   Return in about 8 weeks (around 02/27/2022) for recheck plantar fasciitis, re-check peroneal tendinitis.

## 2022-01-16 NOTE — Therapy (Signed)
OUTPATIENT PHYSICAL THERAPY LOWER EXTREMITY EVALUATION   Patient Name: Sheryl Cox MRN: [id number] DOB:Aug 10, 1970, 51 y.o., female Today's Date: 01/18/2022   PT End of Session - 01/17/22 1705     Visit Number 1    Number of Visits 9    Date for PT Re-Evaluation 03/17/22    Authorization Type UHC MCD; 27 VL    PT Start Time 1705    PT Stop Time 1741    PT Time Calculation (min) 36 min    Activity Tolerance Patient tolerated treatment well    Behavior During Therapy WFL for tasks assessed/performed             Past Medical History:  Diagnosis Date   Anemia    history of blood transfusion-no abnormal reaction noted   Asthma    Albuterol as needed   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+, PR+.Takes Tamoxifen daily   Diabetes mellitus without complication ([place])    takes Metformin daily   History of migraine    several yrs ago   Hypertension    takes Tribenzor daily   Past Surgical History:  Procedure Laterality Date   BIOPSY BREAST Left 12/05/12   fibroadenoma   BREAST LUMPECTOMY     2014   BREAST SURGERY Right    CESAREAN SECTION  01/05/2009   CHOLECYSTECTOMY N/A 01/31/2016   Procedure: LAPAROSCOPIC CHOLECYSTECTOMY;  Surgeon: Erroll Luna, MD;  Location: [facility];  Service: General;  Laterality: N/A;   DILITATION & CURRETTAGE/HYSTROSCOPY WITH HYDROTHERMAL ABLATION N/A 08/12/2013   Procedure: DILATATION & CURETTAGE/HYSTEROSCOPY WITH HYDROTHERMAL ABLATION;  Surgeon: Frederico Hamman, MD;  Location: [facility];  Service: Gynecology;  Laterality: N/A;   ROBOTIC ASSISTED TOTAL HYSTERECTOMY N/A 06/18/2017   Procedure: XI ROBOTIC ASSISTED TOTAL HYSTERECTOMY;  Surgeon: Lavonia Drafts, MD;  Location: [facility];  Service: Gynecology;  Laterality: N/A;   SALPINGOOPHORECTOMY Bilateral 06/18/2017   Procedure: BILATERAL SALPINGO OOPHORECTOMY;  Surgeon: Lavonia Drafts, MD;  Location: [facility];  Service: Gynecology;  Laterality: Bilateral;   Patient Active Problem List    Diagnosis Date Noted   Chronic pain of both knees 05/21/2018   Chronic right shoulder pain 05/21/2018   BMI 40.0-44.9, adult ([place]) 05/21/2018   Anemia 06/05/2013   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   Nabothian cyst 12/07/2011   Cervical mass 11/20/2011   Fibroid uterus 11/20/2011    PCP: Lucianne Lei, MD  REFERRING PROVIDER: Criselda Peaches, DPM  REFERRING DIAG:  M72.2 (ICD-10-CM) - Plantar fasciitis, bilateral  M76.72 (ICD-10-CM) - Peroneal tendinitis, left    THERAPY DIAG:  Pain in left ankle and joints of left foot  Pain in right ankle and joints of right foot  Muscle weakness (generalized)  Other abnormalities of gait and mobility  Rationale for Evaluation and Treatment: Rehabilitation  ONSET DATE: chronic  SUBJECTIVE:   SUBJECTIVE STATEMENT: "I have been having heel spurs for forever." She reports bilateral foot pain for at least 3 years of insidious onset. She reports the pain has worsened. She has tried various shoes that have not helped with her pain. She has received injections with short-term relief. She reports the pain is intermittent about the plantar aspect of her feet, having more pain at night and while on her feet at work. She denies previous injury to the feet.   PERTINENT HISTORY: Breast cancer Diabetes Hypertension  PAIN:  Are you having pain? Yes: NPRS scale: 9/10 Pain location: bilateral plantar aspect of the feet( worst on  the Lt currently) Pain description: shooting, stabbing Aggravating factors: walking, standing Relieving factors: heat, rest   PRECAUTIONS: None  WEIGHT BEARING RESTRICTIONS: No  FALLS:  Has patient fallen in last 6 months? 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)   PLOF: Independent  PATIENT GOALS: get rid of this pain    OBJECTIVE:    DIAGNOSTIC FINDINGS:  Multiple views x-ray of both feet: no fracture, dislocation, swelling or degenerative changes noted, plantar calcaneal spur, and pes planus  PATIENT SURVEYS:  LEFS 20/80  COGNITION: Overall cognitive status: Within functional limits for tasks assessed     SENSATION: WFL  EDEMA:  No obvious swelling     POSTURE: Pes planus in standing  PALPATION: TTP bilateral plantar fascia, Lt peroneal tendons   LOWER EXTREMITY ROM:  Active ROM Right eval Left eval  Hip flexion    Hip extension    Hip abduction    Hip adduction    Hip internal rotation    Hip external rotation    Knee flexion    Knee extension    Ankle dorsiflexion 0 Lacking 5   Ankle plantarflexion WNL WNL  Ankle inversion WNL WNL  Ankle eversion WNL WNL   (Blank rows = not tested)  LOWER EXTREMITY MMT:  MMT Right eval Left eval  Hip flexion 5 5  Hip extension    Hip abduction 3+ 3+  Hip adduction    Hip internal rotation    Hip external rotation    Knee flexion    Knee extension    Ankle dorsiflexion 5 5  Ankle plantarflexion DL partial calf raise DL partial calf raise   Ankle inversion 4 4  Ankle eversion 4 4   (Blank rows = not tested) *Pain with all MMT bilaterally*  LOWER EXTREMITY SPECIAL TESTS:  Windlass (+)   FUNCTIONAL TESTS:  SLS 1 second bilateral   GAIT: Distance walked: 10 ft  Assistive device utilized: None Level of assistance: Complete Independence Comments: maintain supination during stance, foot flat initial contact, limited push-off, WBOS, limited hip/knee flexion during gait cycle.  [facility] Adult PT Treatment:                                                DATE: 01/17/22 Therapeutic Exercise: Demonstrated and issue initial HEP.    Therapeutic Activity: Education on assessment findings that will be addressed throughout duration of POC.       PATIENT EDUCATION:  Education details: see treatment Person educated: Patient Education method:  Explanation, Demonstration, Tactile cues, Verbal cues, and Handouts Education comprehension: verbalized understanding, returned demonstration, verbal cues required, tactile cues required, and needs further education  HOME EXERCISE PROGRAM: Access Code: [email or web address] URL: [email or web address] Date: 01/18/2022 Prepared by: Gwendolyn Grant  Exercises - Seated Calf Stretch with Strap  - 2 x daily - 7 x weekly - 3 sets - 30 sec  hold - Seated Great Toe Extension  - 2 x daily - 7 x weekly - 2 sets - 10 reps - Arch Lifting  - 2 x daily - 7 x weekly - 2 sets - 10 reps  ASSESSMENT:  CLINICAL IMPRESSION: Patient is a 51 y.o. female who was seen today for physical therapy evaluation and treatment  for chronic bilateral foot pain. Her signs and symptoms are consistent with plantar fasciitis.  She is noted to have limited bilateral ankle dorsiflexion AROM, ankle weakness, gait abnormalities, and balance deficits. She will benefit from skilled PT with addition of aquatic therapy in order to optimize her function and assist in overall pain reduction.   OBJECTIVE IMPAIRMENTS: Abnormal gait, decreased activity tolerance, decreased balance, decreased knowledge of condition, difficulty walking, decreased ROM, decreased strength, increased fascial restrictions, improper body mechanics, postural dysfunction, and pain.   ACTIVITY LIMITATIONS: carrying, lifting, bending, standing, squatting, stairs, transfers, and locomotion level  PARTICIPATION LIMITATIONS: meal prep, cleaning, laundry, shopping, community activity, and occupation  PERSONAL FACTORS: Age, Fitness, Profession, Time since onset of injury/illness/exacerbation, and 3+ comorbidities: see PMH above  are also affecting patient's functional outcome.   REHAB POTENTIAL: Fair chronicity of injury  CLINICAL DECISION MAKING: Stable/uncomplicated  EVALUATION COMPLEXITY: Low   GOALS: Goals reviewed with patient? Yes  SHORT TERM GOALS: Target  date: 02/15/22 Patient will be independent and compliant with initial HEP.   Baseline: issued at eval  Goal status: INITIAL  2.  Patient will improve bilateral ankle DF AROM by at least 5 degrees to improve gait mechanics.  Baseline: see above  Goal status: INITIAL  3.  Patient will be able to perform full range DL calf raise to improve push-off during gait cycle.  Baseline: see above  Goal status: INITIAL   LONG TERM GOALS: Target date: 03/15/22  Patient will maintain SLS for at least 5 seconds to improve gait stability.  Baseline: see above  Goal status: INITIAL  2.  Patient will demonstrate 5/5 bilateral ankle strength to improve stability when navigating on uneven terrain.  Baseline: see above  Goal status: INITIAL  3.  Patient will demonstrate at least 4+/5 bilateral hip abductor strength to improve stability about the chain with prolonged walking/standing activity.  Baseline: see above  Goal status: INITIAL  4.  Patient will score at least 40/80 on the LEFS to signify clinically meaningful improvement in functional abilities.  Baseline: see above  Goal status: INITIAL  5.  Patient will report pain at worst rated as </= 5/10 to reduce her current functional limitations.  Baseline: see above  Goal status: INITIAL    PLAN:  PT FREQUENCY: 1x/week  PT DURATION: 8 weeks  PLANNED INTERVENTIONS: Therapeutic exercises, Therapeutic activity, Neuromuscular re-education, Balance training, Gait training, Patient/Family education, Self Care, Joint mobilization, Aquatic Therapy, Dry Needling, Cryotherapy, Moist heat, Manual therapy, and Re-evaluation  PLAN FOR NEXT SESSION: review and progress HEP, foot intrinsics, ankle strengthening, calf stretching, consider TPDN.   Gwendolyn Grant, PT, DPT, ATC 01/18/22 8:27 AM

## 2022-01-17 ENCOUNTER — Ambulatory Visit: Payer: Medicaid Other | Attending: Podiatry

## 2022-01-17 ENCOUNTER — Other Ambulatory Visit: Payer: Self-pay

## 2022-01-17 DIAGNOSIS — M7672 Peroneal tendinitis, left leg: Secondary | ICD-10-CM | POA: Diagnosis not present

## 2022-01-17 DIAGNOSIS — R2689 Other abnormalities of gait and mobility: Secondary | ICD-10-CM | POA: Diagnosis present

## 2022-01-17 DIAGNOSIS — M25571 Pain in right ankle and joints of right foot: Secondary | ICD-10-CM | POA: Insufficient documentation

## 2022-01-17 DIAGNOSIS — M25572 Pain in left ankle and joints of left foot: Secondary | ICD-10-CM | POA: Insufficient documentation

## 2022-01-17 DIAGNOSIS — M6281 Muscle weakness (generalized): Secondary | ICD-10-CM | POA: Diagnosis present

## 2022-01-17 DIAGNOSIS — M722 Plantar fascial fibromatosis: Secondary | ICD-10-CM | POA: Insufficient documentation

## 2022-01-24 ENCOUNTER — Ambulatory Visit: Payer: Medicaid Other

## 2022-01-24 DIAGNOSIS — M25572 Pain in left ankle and joints of left foot: Secondary | ICD-10-CM | POA: Diagnosis not present

## 2022-01-24 DIAGNOSIS — R2689 Other abnormalities of gait and mobility: Secondary | ICD-10-CM

## 2022-01-24 DIAGNOSIS — M6281 Muscle weakness (generalized): Secondary | ICD-10-CM

## 2022-01-24 DIAGNOSIS — M25571 Pain in right ankle and joints of right foot: Secondary | ICD-10-CM

## 2022-01-24 NOTE — Patient Instructions (Signed)
Aquatic Therapy at [facility]-  What to Expect!  Where:   [facility] [address] [place], [place] [address] Rehab phone [phone]  NOTE:  You will receive an automated phone message reminding you of your appt and it will say the appointment is at the [address] [facility] [address] [facility] clinic.          How to Prepare: Please make sure you drink 8 ounces of water about one hour prior to your pool session A caregiver may attend if needed with the patient to help assist as needed. A caregiver can sit in the pool room on chair. Please arrive IN YOUR SUIT and 15 minutes prior to your appointment - this helps to avoid delays in starting your session. Please make sure to attend to any toileting needs prior to entering the pool Locker rooms for changing are provided.   There is direct access to the pool deck form the locker room.  You can lock your belongings in a locker with lock provided. Once on the pool deck your therapist will ask if you have signed the Patient  Consent and Assignment of Benefits form before beginning treatment Your therapist may take your blood pressure prior to, during and after your session if indicated We usually try and create a home exercise program based on activities we do in the pool.  Please be thinking about who might be able to assist you in the pool should you need to participate in an aquatic home exercise program at the time of discharge if you need assistance.  Some patients do not want to or do not have the ability to participate in an aquatic home program - this is not a barrier in any way to you participating in aquatic therapy as part of your current therapy plan! After Discharge from PT, you can continue using home program at  the [facility]/, there is a drop-in fee for $5 ($45 a month)or for 60 years  or older $4.00 ($40 a month for seniors ) or any local [employer] pool.  Memberships for purchase are  available for gym/pool at [facility]  IT IS VERY IMPORTANT THAT YOUR LAST VISIT BE IN THE CLINIC AT [address] AFTER YOUR LAST AQUATIC VISIT.  PLEASE MAKE SURE THAT YOU HAVE A [address]  APPOINTMENT SCHEDULED.   About the pool: Pool is located approximately 500 FT from the entrance of the building.  Please bring a support person if you need assistance traveling this      distance.   Your therapist will assist you in entering the water; there are two ways to           enter: stairs with railings, and a mechanical lift. Your therapist will determine the most appropriate way for you.  Water temperature is usually between 88-90 degrees  There may be up to 2 other swimmers in the pool at the same time  The pool deck is tile, please wear shoes with good traction if you prefer not to be barefoot.    Contact Info:  For appointment scheduling and cancellations:         Please call the [facility]  PH:[phone]              Aquatic Therapy  Outpatient [facility] @ [facility]       All sessions are 45 minutes                                                    

## 2022-01-24 NOTE — Therapy (Signed)
OUTPATIENT PHYSICAL THERAPY TREATMENT NOTE   Patient Name: Sheryl Cox MRN: [id number] DOB:Feb 10, 1971, 51 y.o., female Today's Date: 01/24/2022  PCP: Lucianne Lei, MD  REFERRING PROVIDER: Criselda Peaches, DPM   END OF SESSION:   PT End of Session - 01/24/22 1452     Visit Number 2    Number of Visits 9    Date for PT Re-Evaluation 03/17/22    Authorization Type UHC MCD; 27 VL    PT Start Time 4765    PT Stop Time 1530    PT Time Calculation (min) 39 min    Activity Tolerance Patient tolerated treatment well    Behavior During Therapy WFL for tasks assessed/performed             Past Medical History:  Diagnosis Date   Anemia    history of blood transfusion-no abnormal reaction noted   Asthma    Albuterol as needed   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+, PR+.Takes Tamoxifen daily   Diabetes mellitus without complication ([place])    takes Metformin daily   History of migraine    several yrs ago   Hypertension    takes Tribenzor daily   Past Surgical History:  Procedure Laterality Date   BIOPSY BREAST Left 12/05/12   fibroadenoma   BREAST LUMPECTOMY     2014   BREAST SURGERY Right    CESAREAN SECTION  01/05/2009   CHOLECYSTECTOMY N/A 01/31/2016   Procedure: LAPAROSCOPIC CHOLECYSTECTOMY;  Surgeon: Erroll Luna, MD;  Location: [facility];  Service: General;  Laterality: N/A;   DILITATION & CURRETTAGE/HYSTROSCOPY WITH HYDROTHERMAL ABLATION N/A 08/12/2013   Procedure: DILATATION & CURETTAGE/HYSTEROSCOPY WITH HYDROTHERMAL ABLATION;  Surgeon: Frederico Hamman, MD;  Location: [facility];  Service: Gynecology;  Laterality: N/A;   ROBOTIC ASSISTED TOTAL HYSTERECTOMY N/A 06/18/2017   Procedure: XI ROBOTIC ASSISTED TOTAL HYSTERECTOMY;  Surgeon: Lavonia Drafts, MD;  Location: [facility];  Service: Gynecology;  Laterality: N/A;   SALPINGOOPHORECTOMY Bilateral 06/18/2017   Procedure: BILATERAL SALPINGO OOPHORECTOMY;  Surgeon: Lavonia Drafts, MD;  Location: [facility];   Service: Gynecology;  Laterality: Bilateral;   Patient Active Problem List   Diagnosis Date Noted   Chronic pain of both knees 05/21/2018   Chronic right shoulder pain 05/21/2018   BMI 40.0-44.9, adult ([place]) 05/21/2018   Anemia 06/05/2013   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   Nabothian cyst 12/07/2011   Cervical mass 11/20/2011   Fibroid uterus 11/20/2011    REFERRING DIAG:  M72.2 (ICD-10-CM) - Plantar fasciitis, bilateral  M76.72 (ICD-10-CM) - Peroneal tendinitis, left    THERAPY DIAG:  Pain in left ankle and joints of left foot  Pain in right ankle and joints of right foot  Muscle weakness (generalized)  Other abnormalities of gait and mobility  Rationale for Evaluation and Treatment Rehabilitation  PERTINENT HISTORY:  Breast cancer Diabetes Hypertension   PRECAUTIONS: none   SUBJECTIVE:  SUBJECTIVE STATEMENT:  Patient reports her feet are feeling the same.    PAIN:  Are you having pain? Yes: NPRS scale: 9/10 Pain location: bilateral plantar aspect of feet Pain description: sharp Aggravating factors: walking,standing Relieving factors: rest    OBJECTIVE: (objective measures completed at initial evaluation unless otherwise dated)  DIAGNOSTIC FINDINGS:  Multiple views x-ray of both feet: no fracture, dislocation, swelling or degenerative changes noted, plantar calcaneal spur, and pes planus   PATIENT SURVEYS:  LEFS 20/80   COGNITION: Overall cognitive status: Within functional limits for tasks assessed                         SENSATION: WFL   EDEMA:  No obvious swelling        POSTURE: Pes planus in standing   PALPATION: TTP bilateral plantar fascia, Lt peroneal tendons    LOWER EXTREMITY ROM:   Active ROM Right eval Left eval  Hip  flexion      Hip extension      Hip abduction      Hip adduction      Hip internal rotation      Hip external rotation      Knee flexion      Knee extension      Ankle dorsiflexion 0 Lacking 5   Ankle plantarflexion WNL WNL  Ankle inversion WNL WNL  Ankle eversion WNL WNL   (Blank rows = not tested)   LOWER EXTREMITY MMT:   MMT Right eval Left eval  Hip flexion 5 5  Hip extension      Hip abduction 3+ 3+  Hip adduction      Hip internal rotation      Hip external rotation      Knee flexion      Knee extension      Ankle dorsiflexion 5 5  Ankle plantarflexion DL partial calf raise DL partial calf raise   Ankle inversion 4 4  Ankle eversion 4 4   (Blank rows = not tested) *Pain with all MMT bilaterally*   LOWER EXTREMITY SPECIAL TESTS:  Windlass (+)    FUNCTIONAL TESTS:  SLS 1 second bilateral    GAIT: Distance walked: 10 ft  Assistive device utilized: None Level of assistance: Complete Independence Comments: maintain supination during stance, foot flat initial contact, limited push-off, WBOS, limited hip/knee flexion during gait cycle.  [facility] Adult PT Treatment:                                                DATE: 01/24/22 Therapeutic Exercise: NuStep level 5 x 5 minutes LE  4 way ankle green band 2 x 10  Seated calf raise 10#; 2 x 15; on step to allow more eccentric work Updated HEP  Manual Therapy: STM bilateral gastroc, plantar fascia Passive calf and great toe flexor stretching bilaterally    [facility] Adult PT Treatment:                                                DATE: 01/17/22 Therapeutic Exercise: Demonstrated and issue initial HEP.      Therapeutic Activity: Education on assessment findings that will be addressed throughout duration of POC.  PATIENT EDUCATION:  Education details: see treatment Person educated: Patient Education method: Explanation, Demonstration, Tactile cues, Verbal cues, and Handouts Education comprehension:  verbalized understanding, returned demonstration, verbal cues required, tactile cues required, and needs further education   HOME EXERCISE PROGRAM: Access Code: [email or web address] URL: [email or web address] Date: 01/18/2022 Prepared by: Gwendolyn Grant   Exercises - Seated Calf Stretch with Strap  - 2 x daily - 7 x weekly - 3 sets - 30 sec  hold - Seated Great Toe Extension  - 2 x daily - 7 x weekly - 2 sets - 10 reps - Arch Lifting  - 2 x daily - 7 x weekly - 2 sets - 10 reps   ASSESSMENT:   CLINICAL IMPRESSION: Patient arrives with high pain levels, though in NAD. She has difficulty performing isolated inversion/eversion strengthening having tendency to compensate up the chain with ability to occasionally correct with cues. With seated calf raises she has difficulty controlling eccentric phase of motion. No reports of increased foot pain throughout session, though quickly fatigues with majority of strengthening. HEP updated to include further strengthening.     OBJECTIVE IMPAIRMENTS: Abnormal gait, decreased activity tolerance, decreased balance, decreased knowledge of condition, difficulty walking, decreased ROM, decreased strength, increased fascial restrictions, improper body mechanics, postural dysfunction, and pain.    ACTIVITY LIMITATIONS: carrying, lifting, bending, standing, squatting, stairs, transfers, and locomotion level   PARTICIPATION LIMITATIONS: meal prep, cleaning, laundry, shopping, community activity, and occupation   PERSONAL FACTORS: Age, Fitness, Profession, Time since onset of injury/illness/exacerbation, and 3+ comorbidities: see PMH above  are also affecting patient's functional outcome.    REHAB POTENTIAL: Fair chronicity of injury   CLINICAL DECISION MAKING: Stable/uncomplicated   EVALUATION COMPLEXITY: Low     GOALS: Goals reviewed with patient? Yes   SHORT TERM GOALS: Target date: 02/15/22 Patient will be independent and compliant with initial  HEP.    Baseline: issued at eval  Goal status: INITIAL   2.  Patient will improve bilateral ankle DF AROM by at least 5 degrees to improve gait mechanics.  Baseline: see above  Goal status: INITIAL   3.  Patient will be able to perform full range DL calf raise to improve push-off during gait cycle.  Baseline: see above  Goal status: INITIAL     LONG TERM GOALS: Target date: 03/15/22   Patient will maintain SLS for at least 5 seconds to improve gait stability.  Baseline: see above  Goal status: INITIAL   2.  Patient will demonstrate 5/5 bilateral ankle strength to improve stability when navigating on uneven terrain.  Baseline: see above  Goal status: INITIAL   3.  Patient will demonstrate at least 4+/5 bilateral hip abductor strength to improve stability about the chain with prolonged walking/standing activity.  Baseline: see above  Goal status: INITIAL   4.  Patient will score at least 40/80 on the LEFS to signify clinically meaningful improvement in functional abilities.  Baseline: see above  Goal status: INITIAL   5.  Patient will report pain at worst rated as </= 5/10 to reduce her current functional limitations.  Baseline: see above  Goal status: INITIAL       PLAN:   PT FREQUENCY: 1x/week   PT DURATION: 8 weeks   PLANNED INTERVENTIONS: Therapeutic exercises, Therapeutic activity, Neuromuscular re-education, Balance training, Gait training, Patient/Family education, Self Care, Joint mobilization, Aquatic Therapy, Dry Needling, Cryotherapy, Moist heat, Manual therapy, and Re-evaluation   PLAN FOR NEXT SESSION: review and progress HEP, foot intrinsics, ankle  strengthening, calf stretching, consider TPDN.   Gwendolyn Grant, PT, DPT, ATC 01/24/22 3:31 PM

## 2022-02-01 NOTE — Therapy (Signed)
OUTPATIENT PHYSICAL THERAPY TREATMENT NOTE   Patient Name: Sheryl Cox MRN: [id number] DOB:1971-03-17, 51 y.o., female Today's Date: 02/02/2022  PCP: Lucianne Lei, MD  REFERRING PROVIDER: Criselda Peaches, DPM   END OF SESSION:   PT End of Session - 02/02/22 1611     Visit Number 3    Number of Visits 9    Date for PT Re-Evaluation 03/17/22    Authorization Type UHC MCD; 27 VL    PT Start Time 6294    PT Stop Time 1655    PT Time Calculation (min) 45 min    Activity Tolerance Patient tolerated treatment well    Behavior During Therapy WFL for tasks assessed/performed              Past Medical History:  Diagnosis Date   Anemia    history of blood transfusion-no abnormal reaction noted   Asthma    Albuterol as needed   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+, PR+.Takes Tamoxifen daily   Diabetes mellitus without complication ([place])    takes Metformin daily   History of migraine    several yrs ago   Hypertension    takes Tribenzor daily   Past Surgical History:  Procedure Laterality Date   BIOPSY BREAST Left 12/05/12   fibroadenoma   BREAST LUMPECTOMY     2014   BREAST SURGERY Right    CESAREAN SECTION  01/05/2009   CHOLECYSTECTOMY N/A 01/31/2016   Procedure: LAPAROSCOPIC CHOLECYSTECTOMY;  Surgeon: Erroll Luna, MD;  Location: [place];  Service: General;  Laterality: N/A;   DILITATION & CURRETTAGE/HYSTROSCOPY WITH HYDROTHERMAL ABLATION N/A 08/12/2013   Procedure: DILATATION & CURETTAGE/HYSTEROSCOPY WITH HYDROTHERMAL ABLATION;  Surgeon: Frederico Hamman, MD;  Location: [facility];  Service: Gynecology;  Laterality: N/A;   ROBOTIC ASSISTED TOTAL HYSTERECTOMY N/A 06/18/2017   Procedure: XI ROBOTIC ASSISTED TOTAL HYSTERECTOMY;  Surgeon: Lavonia Drafts, MD;  Location: [facility];  Service: Gynecology;  Laterality: N/A;   SALPINGOOPHORECTOMY Bilateral 06/18/2017   Procedure: BILATERAL SALPINGO OOPHORECTOMY;  Surgeon: Lavonia Drafts, MD;  Location: [facility];   Service: Gynecology;  Laterality: Bilateral;   Patient Active Problem List   Diagnosis Date Noted   Chronic pain of both knees 05/21/2018   Chronic right shoulder pain 05/21/2018   BMI 40.0-44.9, adult ([place]) 05/21/2018   Anemia 06/05/2013   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   Nabothian cyst 12/07/2011   Cervical mass 11/20/2011   Fibroid uterus 11/20/2011    REFERRING DIAG:  M72.2 (ICD-10-CM) - Plantar fasciitis, bilateral  M76.72 (ICD-10-CM) - Peroneal tendinitis, left    THERAPY DIAG:  Pain in left ankle and joints of left foot  Pain in right ankle and joints of right foot  Muscle weakness (generalized)  Other abnormalities of gait and mobility  Rationale for Evaluation and Treatment Rehabilitation  PERTINENT HISTORY:  Breast cancer Diabetes Hypertension   PRECAUTIONS: none   SUBJECTIVE:  SUBJECTIVE STATEMENT:  Patient reports she took 2 Aleve before coming to therapy.    PAIN:  Are you having pain? Yes: NPRS scale: 9/10 Pain location: bilateral plantar aspect of feet Pain description: sharp Aggravating factors: walking,standing Relieving factors: rest    OBJECTIVE: (objective measures completed at initial evaluation unless otherwise dated)  DIAGNOSTIC FINDINGS:  Multiple views x-ray of both feet: no fracture, dislocation, swelling or degenerative changes noted, plantar calcaneal spur, and pes planus   PATIENT SURVEYS:  LEFS 20/80   COGNITION: Overall cognitive status: Within functional limits for tasks assessed                         SENSATION: WFL   EDEMA:  No obvious swelling        POSTURE: Pes planus in standing   PALPATION: TTP bilateral plantar fascia, Lt peroneal tendons    LOWER EXTREMITY ROM:   Active ROM Right eval Left eval   Hip flexion      Hip extension      Hip abduction      Hip adduction      Hip internal rotation      Hip external rotation      Knee flexion      Knee extension      Ankle dorsiflexion 0 Lacking 5   Ankle plantarflexion WNL WNL  Ankle inversion WNL WNL  Ankle eversion WNL WNL   (Blank rows = not tested)   LOWER EXTREMITY MMT:   MMT Right eval Left eval  Hip flexion 5 5  Hip extension      Hip abduction 3+ 3+  Hip adduction      Hip internal rotation      Hip external rotation      Knee flexion      Knee extension      Ankle dorsiflexion 5 5  Ankle plantarflexion DL partial calf raise DL partial calf raise   Ankle inversion 4 4  Ankle eversion 4 4   (Blank rows = not tested) *Pain with all MMT bilaterally*   LOWER EXTREMITY SPECIAL TESTS:  Windlass (+)    FUNCTIONAL TESTS:  SLS 1 second bilateral    GAIT: Distance walked: 10 ft  Assistive device utilized: None Level of assistance: Complete Independence Comments: maintain supination during stance, foot flat initial contact, limited push-off, WBOS, limited hip/knee flexion during gait cycle.   [facility] Adult PT Treatment:                                                DATE: 02/02/2022 Aquatic therapy at [address] - therapeutic pool temp 92 degrees Pt enters building ambulating independently  Treatment took place in water 3.8 to  4 ft 8 in.feet deep depending upon activity.  Pt entered and exited the pool via stair and handrails independently  Pt pain level 9/10 at initiation of water walking. Patient entered water for aquatic therapy for first time and was introduced to principles and therapeutic effects of water as they ambulated and acclimated to pool.  Therapeutic Exercise: Walking forward/backwards/side stepping x 2 laps each Runners stretch on bottom step x30" BIL Hamstring stretch on bottom step x30" BIL Calf stretch lunge position 2x45" BIL Soleus stretch lunge position 2x45" BIL Calf  stretch edge of bottom step x1' At edge of  pool, pt performed LE exercise: Heel raise 2x20 Single leg heel raise x20 BIL Toe raises 2x20 Hip abd/add x20 BIL  Pt requires the buoyancy of water for active assisted exercises with buoyancy supported for strengthening and AROM exercises. Hydrostatic pressure also supports joints by unweighting joint load by at least 50 % in 3-4 feet depth water. 80% in chest to neck deep water. Water will provide assistance with movement using the current and laminar flow while the buoyancy reduces weight bearing. Pt requires the viscosity of the water for resistance with strengthening exercises.   [facility] Adult PT Treatment:                                                DATE: 01/24/22 Therapeutic Exercise: NuStep level 5 x 5 minutes LE  4 way ankle green band 2 x 10  Seated calf raise 10#; 2 x 15; on step to allow more eccentric work Updated HEP  Manual Therapy: STM bilateral gastroc, plantar fascia Passive calf and great toe flexor stretching bilaterally    [facility] Adult PT Treatment:                                                DATE: 01/17/22 Therapeutic Exercise: Demonstrated and issue initial HEP.      Therapeutic Activity: Education on assessment findings that will be addressed throughout duration of POC.            PATIENT EDUCATION:  Education details: see treatment Person educated: Patient Education method: Explanation, Demonstration, Tactile cues, Verbal cues, and Handouts Education comprehension: verbalized understanding, returned demonstration, verbal cues required, tactile cues required, and needs further education   HOME EXERCISE PROGRAM: Access Code: [email or web address] URL: [email or web address] Date: 01/18/2022 Prepared by: Gwendolyn Grant   Exercises - Seated Calf Stretch with Strap  - 2 x daily - 7 x weekly - 3 sets - 30 sec  hold - Seated Great Toe Extension  - 2 x daily - 7 x weekly - 2 sets - 10 reps - Arch Lifting  - 2 x  daily - 7 x weekly - 2 sets - 10 reps   ASSESSMENT:   CLINICAL IMPRESSION: Patient presents to first aquatic PT session 10 minutes late and initially frustrated due to not knowing where to go. She reports 9/10 pain in BIL feet at beginning of session. Session today focused on proximal hip and distal LE strengthening and stretching in the aquatic environment for use of buoyancy to offload joints and the viscosity of water as resistance during therapeutic exercise. Patient was able to tolerate all prescribed exercises in the aquatic environment with no adverse effects and reports 9/10 pain at the end of the session. Patient continues to benefit from skilled PT services on land and aquatic based and should be progressed as able to improve functional independence.    OBJECTIVE IMPAIRMENTS: Abnormal gait, decreased activity tolerance, decreased balance, decreased knowledge of condition, difficulty walking, decreased ROM, decreased strength, increased fascial restrictions, improper body mechanics, postural dysfunction, and pain.    ACTIVITY LIMITATIONS: carrying, lifting, bending, standing, squatting, stairs, transfers, and locomotion level   PARTICIPATION LIMITATIONS: meal prep, cleaning, laundry, shopping, community activity, and occupation   PERSONAL FACTORS: Age, Fitness, Profession, Time  since onset of injury/illness/exacerbation, and 3+ comorbidities: see PMH above  are also affecting patient's functional outcome.    REHAB POTENTIAL: Fair chronicity of injury   CLINICAL DECISION MAKING: Stable/uncomplicated   EVALUATION COMPLEXITY: Low     GOALS: Goals reviewed with patient? Yes   SHORT TERM GOALS: Target date: 02/15/22 Patient will be independent and compliant with initial HEP.    Baseline: issued at eval  Goal status: INITIAL   2.  Patient will improve bilateral ankle DF AROM by at least 5 degrees to improve gait mechanics.  Baseline: see above  Goal status: INITIAL   3.   Patient will be able to perform full range DL calf raise to improve push-off during gait cycle.  Baseline: see above  Goal status: INITIAL     LONG TERM GOALS: Target date: 03/15/22   Patient will maintain SLS for at least 5 seconds to improve gait stability.  Baseline: see above  Goal status: INITIAL   2.  Patient will demonstrate 5/5 bilateral ankle strength to improve stability when navigating on uneven terrain.  Baseline: see above  Goal status: INITIAL   3.  Patient will demonstrate at least 4+/5 bilateral hip abductor strength to improve stability about the chain with prolonged walking/standing activity.  Baseline: see above  Goal status: INITIAL   4.  Patient will score at least 40/80 on the LEFS to signify clinically meaningful improvement in functional abilities.  Baseline: see above  Goal status: INITIAL   5.  Patient will report pain at worst rated as </= 5/10 to reduce her current functional limitations.  Baseline: see above  Goal status: INITIAL       PLAN:   PT FREQUENCY: 1x/week   PT DURATION: 8 weeks   PLANNED INTERVENTIONS: Therapeutic exercises, Therapeutic activity, Neuromuscular re-education, Balance training, Gait training, Patient/Family education, Self Care, Joint mobilization, Aquatic Therapy, Dry Needling, Cryotherapy, Moist heat, Manual therapy, and Re-evaluation   PLAN FOR NEXT SESSION: review and progress HEP, foot intrinsics, ankle strengthening, calf stretching, consider TPDN.   Margarette Canada, PTA 02/02/22 4:11 PM

## 2022-02-02 ENCOUNTER — Ambulatory Visit: Payer: Medicaid Other

## 2022-02-02 DIAGNOSIS — M25572 Pain in left ankle and joints of left foot: Secondary | ICD-10-CM

## 2022-02-02 DIAGNOSIS — M6281 Muscle weakness (generalized): Secondary | ICD-10-CM

## 2022-02-02 DIAGNOSIS — M25571 Pain in right ankle and joints of right foot: Secondary | ICD-10-CM

## 2022-02-02 DIAGNOSIS — R2689 Other abnormalities of gait and mobility: Secondary | ICD-10-CM

## 2022-02-12 ENCOUNTER — Telehealth (INDEPENDENT_AMBULATORY_CARE_PROVIDER_SITE_OTHER): Payer: Self-pay

## 2022-02-12 NOTE — Telephone Encounter (Signed)
Patient called complaining of a stye OS. She has been dismissed from the practice but did speak with Dr. Coralyn Pear and he recommended to have her call a general ophthalmologist to treat the stye. Called patient twice left a voicemail relating the message.

## 2022-02-13 DIAGNOSIS — M19019 Primary osteoarthritis, unspecified shoulder: Secondary | ICD-10-CM | POA: Insufficient documentation

## 2022-02-15 ENCOUNTER — Ambulatory Visit: Payer: Medicaid Other

## 2022-02-15 ENCOUNTER — Ambulatory Visit: Payer: Medicaid Other | Admitting: Physical Therapy

## 2022-02-15 DIAGNOSIS — M25572 Pain in left ankle and joints of left foot: Secondary | ICD-10-CM

## 2022-02-15 DIAGNOSIS — M25571 Pain in right ankle and joints of right foot: Secondary | ICD-10-CM

## 2022-02-15 DIAGNOSIS — R2689 Other abnormalities of gait and mobility: Secondary | ICD-10-CM

## 2022-02-15 DIAGNOSIS — M6281 Muscle weakness (generalized): Secondary | ICD-10-CM

## 2022-02-15 NOTE — Therapy (Signed)
OUTPATIENT PHYSICAL THERAPY TREATMENT NOTE   Patient Name: Sheryl Cox MRN: [id number] DOB:Mar 02, 1971, 51 y.o., female Today's Date: 02/15/2022  PCP: Lucianne Lei, MD  REFERRING PROVIDER: Criselda Peaches, DPM   END OF SESSION:   PT End of Session - 02/15/22 1705     Visit Number 4    Number of Visits 9    Date for PT Re-Evaluation 03/17/22    Authorization Type UHC MCD; 27 VL    PT Start Time 1705    PT Stop Time 1745    PT Time Calculation (min) 40 min    Activity Tolerance Patient tolerated treatment well    Behavior During Therapy WFL for tasks assessed/performed               Past Medical History:  Diagnosis Date   Anemia    history of blood transfusion-no abnormal reaction noted   Asthma    Albuterol as needed   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+, PR+.Takes Tamoxifen daily   Diabetes mellitus without complication ([place])    takes Metformin daily   History of migraine    several yrs ago   Hypertension    takes Tribenzor daily   Past Surgical History:  Procedure Laterality Date   BIOPSY BREAST Left 12/05/12   fibroadenoma   BREAST LUMPECTOMY     2014   BREAST SURGERY Right    CESAREAN SECTION  01/05/2009   CHOLECYSTECTOMY N/A 01/31/2016   Procedure: LAPAROSCOPIC CHOLECYSTECTOMY;  Surgeon: Erroll Luna, MD;  Location: [place];  Service: General;  Laterality: N/A;   DILITATION & CURRETTAGE/HYSTROSCOPY WITH HYDROTHERMAL ABLATION N/A 08/12/2013   Procedure: DILATATION & CURETTAGE/HYSTEROSCOPY WITH HYDROTHERMAL ABLATION;  Surgeon: Frederico Hamman, MD;  Location: [facility];  Service: Gynecology;  Laterality: N/A;   ROBOTIC ASSISTED TOTAL HYSTERECTOMY N/A 06/18/2017   Procedure: XI ROBOTIC ASSISTED TOTAL HYSTERECTOMY;  Surgeon: Lavonia Drafts, MD;  Location: [facility];  Service: Gynecology;  Laterality: N/A;   SALPINGOOPHORECTOMY Bilateral 06/18/2017   Procedure: BILATERAL SALPINGO OOPHORECTOMY;  Surgeon: Lavonia Drafts, MD;  Location[facility];  Service: Gynecology;  Laterality: Bilateral;   Patient Active Problem List   Diagnosis Date Noted   Chronic pain of both knees 05/21/2018   Chronic right shoulder pain 05/21/2018   BMI 40.0-44.9, adult ([place]) 05/21/2018   Anemia 06/05/2013   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   Nabothian cyst 12/07/2011   Cervical mass 11/20/2011   Fibroid uterus 11/20/2011    REFERRING DIAG:  M72.2 (ICD-10-CM) - Plantar fasciitis, bilateral  M76.72 (ICD-10-CM) - Peroneal tendinitis, left    THERAPY DIAG:  Pain in left ankle and joints of left foot  Pain in right ankle and joints of right foot  Muscle weakness (generalized)  Other abnormalities of gait and mobility  Rationale for Evaluation and Treatment Rehabilitation  PERTINENT HISTORY:  Breast cancer Diabetes Hypertension   PRECAUTIONS: none   SUBJECTIVE:  SUBJECTIVE STATEMENT:  "My feet are hurting so bad."    PAIN:  Are you having pain? Yes: NPRS scale: 10/10 Pain location: bilateral plantar aspect of feet Pain description: sharp Aggravating factors: walking,standing Relieving factors: rest    OBJECTIVE: (objective measures completed at initial evaluation unless otherwise dated)  DIAGNOSTIC FINDINGS:  Multiple views x-ray of both feet: no fracture, dislocation, swelling or degenerative changes noted, plantar calcaneal spur, and pes planus   PATIENT SURVEYS:  LEFS 20/80   COGNITION: Overall cognitive status: Within functional limits for tasks assessed                         SENSATION: WFL   EDEMA:  No obvious swelling        POSTURE: Pes planus in standing   PALPATION: TTP bilateral plantar fascia, Lt peroneal tendons    LOWER EXTREMITY ROM:   Active ROM Right eval Left eval 02/15/22:  Hip  flexion       Hip extension       Hip abduction       Hip adduction       Hip internal rotation       Hip external rotation       Knee flexion       Knee extension       Ankle dorsiflexion 0 Lacking 5  Lt: 0 Rt: 5   Ankle plantarflexion WNL WNL   Ankle inversion WNL WNL   Ankle eversion WNL WNL    (Blank rows = not tested)   LOWER EXTREMITY MMT:   MMT Right eval Left eval 02/15/22  Hip flexion 5 5   Hip extension       Hip abduction 3+ 3+   Hip adduction       Hip internal rotation       Hip external rotation       Knee flexion       Knee extension       Ankle dorsiflexion 5 5   Ankle plantarflexion DL partial calf raise DL partial calf raise  DL partial calf raise  Ankle inversion 4 4   Ankle eversion 4 4    (Blank rows = not tested) *Pain with all MMT bilaterally*   LOWER EXTREMITY SPECIAL TESTS:  Windlass (+)    FUNCTIONAL TESTS:  SLS 1 second bilateral    GAIT: Distance walked: 10 ft  Assistive device utilized: None Level of assistance: Complete Independence Comments: maintain supination during stance, foot flat initial contact, limited push-off, WBOS, limited hip/knee flexion during gait cycle.   [facility] Adult PT Treatment:                                                DATE: 02/15/22 Therapeutic Exercise: NuStep level 6 x 5 minutes  Calf stretch on wedge x 60 seconds each gastroc/soleus  Ankle rockerboard 2 x 10; A/P and lateral  Sidelying hip abduction 2 x 10  Hip bridge 2 x 10  SLR 2 x 10  Updated HEP   [facility] Adult PT Treatment:  DATE: 02/02/2022 Aquatic therapy at [address] - therapeutic pool temp 92 degrees Pt enters building ambulating independently  Treatment took place in water 3.8 to  4 ft 8 in.feet deep depending upon activity.  Pt entered and exited the pool via stair and handrails independently  Pt pain level 9/10 at initiation of water walking. Patient entered water for aquatic  therapy for first time and was introduced to principles and therapeutic effects of water as they ambulated and acclimated to pool.  Therapeutic Exercise: Walking forward/backwards/side stepping x 2 laps each Runners stretch on bottom step x30" BIL Hamstring stretch on bottom step x30" BIL Calf stretch lunge position 2x45" BIL Soleus stretch lunge position 2x45" BIL Calf stretch edge of bottom step x1' At edge of pool, pt performed LE exercise: Heel raise 2x20 Single leg heel raise x20 BIL Toe raises 2x20 Hip abd/add x20 BIL  Pt requires the buoyancy of water for active assisted exercises with buoyancy supported for strengthening and AROM exercises. Hydrostatic pressure also supports joints by unweighting joint load by at least 50 % in 3-4 feet depth water. 80% in chest to neck deep water. Water will provide assistance with movement using the current and laminar flow while the buoyancy reduces weight bearing. Pt requires the viscosity of the water for resistance with strengthening exercises.   [facility] Adult PT Treatment:                                                DATE: 01/24/22 Therapeutic Exercise: NuStep level 5 x 5 minutes LE  4 way ankle green band 2 x 10  Seated calf raise 10#; 2 x 15; on step to allow more eccentric work Updated HEP  Manual Therapy: STM bilateral gastroc, plantar fascia Passive calf and great toe flexor stretching bilaterally          PATIENT EDUCATION:  Education details: see treatment Person educated: Patient Education method: Explanation, Demonstration, Tactile cues, Verbal cues, and Handouts Education comprehension: verbalized understanding, returned demonstration, verbal cues required, tactile cues required, and needs further education   HOME EXERCISE PROGRAM: Access Code: [email or web address] URL: [email or web address] Date: 01/18/2022 Prepared by: Gwendolyn Grant   Exercises - Seated Calf Stretch with Strap  - 2 x daily - 7 x weekly - 3 sets -  30 sec  hold - Seated Great Toe Extension  - 2 x daily - 7 x weekly - 2 sets - 10 reps - Arch Lifting  - 2 x daily - 7 x weekly - 2 sets - 10 reps   ASSESSMENT:   CLINICAL IMPRESSION: Patient arrives with high pain levels, though in NAD. Her DF AROM has improved bilaterally having met this STG. Session focused on improving ankle mobility as well as incorporating proximal hip strengthening to improve distal stability. She quickly fatigues with hip strengthening.She reported reduction in her foot pain at conclusion of session rated as 7/10.     OBJECTIVE IMPAIRMENTS: Abnormal gait, decreased activity tolerance, decreased balance, decreased knowledge of condition, difficulty walking, decreased ROM, decreased strength, increased fascial restrictions, improper body mechanics, postural dysfunction, and pain.    ACTIVITY LIMITATIONS: carrying, lifting, bending, standing, squatting, stairs, transfers, and locomotion level   PARTICIPATION LIMITATIONS: meal prep, cleaning, laundry, shopping, community activity, and occupation   PERSONAL FACTORS: Age, Fitness, Profession, Time since onset of injury/illness/exacerbation, and 3+ comorbidities: see PMH  above  are also affecting patient's functional outcome.    REHAB POTENTIAL: Fair chronicity of injury   CLINICAL DECISION MAKING: Stable/uncomplicated   EVALUATION COMPLEXITY: Low     GOALS: Goals reviewed with patient? Yes   SHORT TERM GOALS: Target date: 02/15/22 Patient will be independent and compliant with initial HEP.    Baseline: issued at eval  Goal status: met   2.  Patient will improve bilateral ankle DF AROM by at least 5 degrees to improve gait mechanics.  Baseline: see above  Goal status: met   3.  Patient will be able to perform full range DL calf raise to improve push-off during gait cycle.  Baseline: see above  Goal status: ongoing      LONG TERM GOALS: Target date: 03/15/22   Patient will maintain SLS for at least 5  seconds to improve gait stability.  Baseline: see above  Goal status: INITIAL   2.  Patient will demonstrate 5/5 bilateral ankle strength to improve stability when navigating on uneven terrain.  Baseline: see above  Goal status: INITIAL   3.  Patient will demonstrate at least 4+/5 bilateral hip abductor strength to improve stability about the chain with prolonged walking/standing activity.  Baseline: see above  Goal status: INITIAL   4.  Patient will score at least 40/80 on the LEFS to signify clinically meaningful improvement in functional abilities.  Baseline: see above  Goal status: INITIAL   5.  Patient will report pain at worst rated as </= 5/10 to reduce her current functional limitations.  Baseline: see above  Goal status: INITIAL       PLAN:   PT FREQUENCY: 1x/week   PT DURATION: 8 weeks   PLANNED INTERVENTIONS: Therapeutic exercises, Therapeutic activity, Neuromuscular re-education, Balance training, Gait training, Patient/Family education, Self Care, Joint mobilization, Aquatic Therapy, Dry Needling, Cryotherapy, Moist heat, Manual therapy, and Re-evaluation   PLAN FOR NEXT SESSION: review and progress HEP, foot intrinsics, ankle/hip strengthening, calf stretching, consider TPDN.   Gwendolyn Grant, PT, DPT, ATC 02/15/22 5:46 PM

## 2022-02-22 ENCOUNTER — Ambulatory Visit: Payer: Medicaid Other | Attending: Podiatry | Admitting: Physical Therapy

## 2022-02-22 ENCOUNTER — Encounter: Payer: Self-pay | Admitting: Physical Therapy

## 2022-02-22 DIAGNOSIS — M25571 Pain in right ankle and joints of right foot: Secondary | ICD-10-CM

## 2022-02-22 DIAGNOSIS — M25572 Pain in left ankle and joints of left foot: Secondary | ICD-10-CM | POA: Diagnosis present

## 2022-02-22 DIAGNOSIS — M6281 Muscle weakness (generalized): Secondary | ICD-10-CM

## 2022-02-22 DIAGNOSIS — R2689 Other abnormalities of gait and mobility: Secondary | ICD-10-CM | POA: Diagnosis present

## 2022-02-22 NOTE — Therapy (Signed)
OUTPATIENT PHYSICAL THERAPY TREATMENT NOTE   Patient Name: Sheryl Cox MRN: [id number] DOB:01-31-1971, 51 y.o., female Today's Date: 02/23/2022  PCP: Lucianne Lei, MD  REFERRING PROVIDER: Criselda Peaches, DPM   END OF SESSION:   PT End of Session - 02/22/22 1623     Visit Number 5    Number of Visits 9    Date for PT Re-Evaluation 03/17/22    Authorization Type [employer] MCD; 27 VL    PT Start Time 1622    PT Stop Time 1700    PT Time Calculation (min) 38 min    Activity Tolerance Patient tolerated treatment well    Behavior During Therapy WFL for tasks assessed/performed               Past Medical History:  Diagnosis Date   Anemia    history of blood transfusion-no abnormal reaction noted   Asthma    Albuterol as needed   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+, PR+.Takes Tamoxifen daily   Diabetes mellitus without complication ([place])    takes Metformin daily   History of migraine    several yrs ago   Hypertension    takes Tribenzor daily   Past Surgical History:  Procedure Laterality Date   BIOPSY BREAST Left 12/05/12   fibroadenoma   BREAST LUMPECTOMY     2014   BREAST SURGERY Right    CESAREAN SECTION  01/05/2009   CHOLECYSTECTOMY N/A 01/31/2016   Procedure: LAPAROSCOPIC CHOLECYSTECTOMY;  Surgeon: Erroll Luna, MD;  Location: [facility];  Service: General;  Laterality: N/A;   DILITATION & CURRETTAGE/HYSTROSCOPY WITH HYDROTHERMAL ABLATION N/A 08/12/2013   Procedure: DILATATION & CURETTAGE/HYSTEROSCOPY WITH HYDROTHERMAL ABLATION;  Surgeon: Frederico Hamman, MD;  Location: [facility];  Service: Gynecology;  Laterality: N/A;   ROBOTIC ASSISTED TOTAL HYSTERECTOMY N/A 06/18/2017   Procedure: XI ROBOTIC ASSISTED TOTAL HYSTERECTOMY;  Surgeon: Lavonia Drafts, MD;  Location: [facility];  Service: Gynecology;  Laterality: N/A;   SALPINGOOPHORECTOMY Bilateral 06/18/2017   Procedure: BILATERAL SALPINGO OOPHORECTOMY;  Surgeon: Lavonia Drafts, MD;  Location: [facility];   Service: Gynecology;  Laterality: Bilateral;   Patient Active Problem List   Diagnosis Date Noted   Chronic pain of both knees 05/21/2018   Chronic right shoulder pain 05/21/2018   BMI 40.0-44.9, adult ([place]) 05/21/2018   Anemia 06/05/2013   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   Nabothian cyst 12/07/2011   Cervical mass 11/20/2011   Fibroid uterus 11/20/2011    REFERRING DIAG:  M72.2 (ICD-10-CM) - Plantar fasciitis, bilateral  M76.72 (ICD-10-CM) - Peroneal tendinitis, left    THERAPY DIAG:  Pain in left ankle and joints of left foot  Pain in right ankle and joints of right foot  Muscle weakness (generalized)  Other abnormalities of gait and mobility  Rationale for Evaluation and Treatment Rehabilitation  PERTINENT HISTORY:  Breast cancer Diabetes Hypertension   PRECAUTIONS: none   SUBJECTIVE:  SUBJECTIVE STATEMENT:  Pt states that her L foot and her L knee are very painful today   PAIN:  Are you having pain? Yes: NPRS scale: 9/10 Pain location: bilateral plantar aspect of feet Pain description: sharp Aggravating factors: walking,standing Relieving factors: rest    OBJECTIVE: (objective measures completed at initial evaluation unless otherwise dated)  DIAGNOSTIC FINDINGS:  Multiple views x-ray of both feet: no fracture, dislocation, swelling or degenerative changes noted, plantar calcaneal spur, and pes planus   PATIENT SURVEYS:  LEFS 20/80   COGNITION: Overall cognitive status: Within functional limits for tasks assessed                         SENSATION: WFL   EDEMA:  No obvious swelling        POSTURE: Pes planus in standing   PALPATION: TTP bilateral plantar fascia, Lt peroneal tendons    LOWER EXTREMITY ROM:   Active ROM Right eval  Left eval 02/15/22:  Hip flexion       Hip extension       Hip abduction       Hip adduction       Hip internal rotation       Hip external rotation       Knee flexion       Knee extension       Ankle dorsiflexion 0 Lacking 5  Lt: 0 Rt: 5   Ankle plantarflexion WNL WNL   Ankle inversion WNL WNL   Ankle eversion WNL WNL    (Blank rows = not tested)   LOWER EXTREMITY MMT:   MMT Right eval Left eval 02/15/22  Hip flexion 5 5   Hip extension       Hip abduction 3+ 3+   Hip adduction       Hip internal rotation       Hip external rotation       Knee flexion       Knee extension       Ankle dorsiflexion 5 5   Ankle plantarflexion DL partial calf raise DL partial calf raise  DL partial calf raise  Ankle inversion 4 4   Ankle eversion 4 4    (Blank rows = not tested) *Pain with all MMT bilaterally*   LOWER EXTREMITY SPECIAL TESTS:  Windlass (+)    FUNCTIONAL TESTS:  SLS 1 second bilateral    GAIT: Distance walked: 10 ft  Assistive device utilized: None Level of assistance: Complete Independence Comments: maintain supination during stance, foot flat initial contact, limited push-off, WBOS, limited hip/knee flexion during gait cycle.   [facility] Adult PT Treatment:                                                DATE: 02/22/2022 Aquatic therapy at [address] - therapeutic pool temp 92 degrees Pt enters building ambulating independently  Treatment took place in water 3.8 to  4 ft 8 in.feet deep depending upon activity.  Pt entered and exited the pool via stair and handrails independently  Pt pain level 9/10 at initiation of water walking. Patient entered water for aquatic therapy for first time and was introduced to principles and therapeutic effects of water as they ambulated and acclimated to pool.  Therapeutic Exercise: Walking forward/backwards/side stepping x 2 laps each Runners  stretch on bottom step x30" BIL Calf stretch edge of bottom step  30''x3 Walking march Walking march with PF in SLS At edge of pool, pt performed LE exercise: Single leg heel raise 2x20 BIL SLS with rotations - yellow dumbells Step up on first step with rails 2x10 fwd x10 lat Hip abd/add x20 BIL  Pt requires the buoyancy of water for active assisted exercises with buoyancy supported for strengthening and AROM exercises. Hydrostatic pressure also supports joints by unweighting joint load by at least 50 % in 3-4 feet depth water. 80% in chest to neck deep water. Water will provide assistance with movement using the current and laminar flow while the buoyancy reduces weight bearing. Pt requires the viscosity of the water for resistance with strengthening exercises.  [facility] Adult PT Treatment:                                                DATE: 02/15/22 Therapeutic Exercise: NuStep level 6 x 5 minutes  Calf stretch on wedge x 60 seconds each gastroc/soleus  Ankle rockerboard 2 x 10; A/P and lateral  Sidelying hip abduction 2 x 10  Hip bridge 2 x 10  SLR 2 x 10  Updated HEP   [facility] Adult PT Treatment:                                                DATE: 02/02/2022 Aquatic therapy at [address] - therapeutic pool temp 92 degrees Pt enters building ambulating independently  Treatment took place in water 3.8 to  4 ft 8 in.feet deep depending upon activity.  Pt entered and exited the pool via stair and handrails independently  Pt pain level 9/10 at initiation of water walking. Patient entered water for aquatic therapy for first time and was introduced to principles and therapeutic effects of water as they ambulated and acclimated to pool.  Therapeutic Exercise: Walking forward/backwards/side stepping x 2 laps each Runners stretch on bottom step x30" BIL Hamstring stretch on bottom step x30" BIL Calf stretch lunge position 2x45" BIL Soleus stretch lunge position 2x45" BIL Calf stretch edge of bottom step x1' At edge of pool, pt performed  LE exercise: Heel raise 2x20 Single leg heel raise x20 BIL Toe raises 2x20 Hip abd/add x20 BIL  Pt requires the buoyancy of water for active assisted exercises with buoyancy supported for strengthening and AROM exercises. Hydrostatic pressure also supports joints by unweighting joint load by at least 50 % in 3-4 feet depth water. 80% in chest to neck deep water. Water will provide assistance with movement using the current and laminar flow while the buoyancy reduces weight bearing. Pt requires the viscosity of the water for resistance with strengthening exercises.   [facility] Adult PT Treatment:                                                DATE: 01/24/22 Therapeutic Exercise: NuStep level 5 x 5 minutes LE  4 way ankle green band 2 x 10  Seated calf raise 10#; 2 x 15; on step to allow  more eccentric work Updated HEP  Manual Therapy: STM bilateral gastroc, plantar fascia Passive calf and great toe flexor stretching bilaterally          PATIENT EDUCATION:  Education details: see treatment Person educated: Patient Education method: Explanation, Demonstration, Tactile cues, Verbal cues, and Handouts Education comprehension: verbalized understanding, returned demonstration, verbal cues required, tactile cues required, and needs further education   HOME EXERCISE PROGRAM: Access Code: [email or web address] URL: [email or web address] Date: 01/18/2022 Prepared by: Gwendolyn Grant   Exercises - Seated Calf Stretch with Strap  - 2 x daily - 7 x weekly - 3 sets - 30 sec  hold - Seated Great Toe Extension  - 2 x daily - 7 x weekly - 2 sets - 10 reps - Arch Lifting  - 2 x daily - 7 x weekly - 2 sets - 10 reps   ASSESSMENT:   CLINICAL IMPRESSION: Session today focused on improving comfort with SLS as well as general LE strengthening/endurance in the aquatic environment for use of buoyancy to offload joints and the viscosity of water as resistance during therapeutic exercise.  Renee tolerates  session well and reduced w/b in water helps to normalize gait and comfort during ambulation.  She shows overall endurance, local (ankle and R knee) and global strength deficit, and balance deficit L>R.  Patient was able to tolerate all prescribed exercises in the aquatic environment with no adverse effects and reports 8/10 pain at the end of the session. Patient continues to benefit from skilled PT services on land and aquatic based and should be progressed as able to improve functional independence.       OBJECTIVE IMPAIRMENTS: Abnormal gait, decreased activity tolerance, decreased balance, decreased knowledge of condition, difficulty walking, decreased ROM, decreased strength, increased fascial restrictions, improper body mechanics, postural dysfunction, and pain.    ACTIVITY LIMITATIONS: carrying, lifting, bending, standing, squatting, stairs, transfers, and locomotion level   PARTICIPATION LIMITATIONS: meal prep, cleaning, laundry, shopping, community activity, and occupation   PERSONAL FACTORS: Age, Fitness, Profession, Time since onset of injury/illness/exacerbation, and 3+ comorbidities: see PMH above  are also affecting patient's functional outcome.    REHAB POTENTIAL: Fair chronicity of injury   CLINICAL DECISION MAKING: Stable/uncomplicated   EVALUATION COMPLEXITY: Low     GOALS: Goals reviewed with patient? Yes   SHORT TERM GOALS: Target date: 02/15/22 Patient will be independent and compliant with initial HEP.    Baseline: issued at eval  Goal status: met   2.  Patient will improve bilateral ankle DF AROM by at least 5 degrees to improve gait mechanics.  Baseline: see above  Goal status: met   3.  Patient will be able to perform full range DL calf raise to improve push-off during gait cycle.  Baseline: see above  Goal status: ongoing      LONG TERM GOALS: Target date: 03/15/22   Patient will maintain SLS for at least 5 seconds to improve gait stability.  Baseline:  see above  Goal status: INITIAL   2.  Patient will demonstrate 5/5 bilateral ankle strength to improve stability when navigating on uneven terrain.  Baseline: see above  Goal status: INITIAL   3.  Patient will demonstrate at least 4+/5 bilateral hip abductor strength to improve stability about the chain with prolonged walking/standing activity.  Baseline: see above  Goal status: INITIAL   4.  Patient will score at least 40/80 on the LEFS to signify clinically meaningful improvement in functional abilities.  Baseline: see above  Goal status:  INITIAL   5.  Patient will report pain at worst rated as </= 5/10 to reduce her current functional limitations.  Baseline: see above  Goal status: INITIAL       PLAN:   PT FREQUENCY: 1x/week   PT DURATION: 8 weeks   PLANNED INTERVENTIONS: Therapeutic exercises, Therapeutic activity, Neuromuscular re-education, Balance training, Gait training, Patient/Family education, Self Care, Joint mobilization, Aquatic Therapy, Dry Needling, Cryotherapy, Moist heat, Manual therapy, and Re-evaluation   PLAN FOR NEXT SESSION: review and progress HEP, foot intrinsics, ankle/hip strengthening, calf stretching, consider TPDN.   Kevan Ny Jamye Balicki PT 02/23/22 7:31 AM

## 2022-02-27 ENCOUNTER — Ambulatory Visit (INDEPENDENT_AMBULATORY_CARE_PROVIDER_SITE_OTHER): Payer: Medicaid Other | Admitting: Podiatry

## 2022-02-27 DIAGNOSIS — M7672 Peroneal tendinitis, left leg: Secondary | ICD-10-CM | POA: Diagnosis not present

## 2022-02-27 DIAGNOSIS — M722 Plantar fascial fibromatosis: Secondary | ICD-10-CM | POA: Diagnosis not present

## 2022-02-27 NOTE — Therapy (Signed)
OUTPATIENT PHYSICAL THERAPY TREATMENT NOTE   Patient Name: Sheryl Cox MRN: [id number] DOB:01-31-1971, 51 y.o., female Today's Date: 02/23/2022  PCP: Lucianne Lei, MD  REFERRING PROVIDER: Criselda Peaches, DPM   END OF SESSION:   PT End of Session - 02/22/22 1623     Visit Number 5    Number of Visits 9    Date for PT Re-Evaluation 03/17/22    Authorization Type [employer] MCD; 27 VL    PT Start Time 1622    PT Stop Time 1700    PT Time Calculation (min) 38 min    Activity Tolerance Patient tolerated treatment well    Behavior During Therapy WFL for tasks assessed/performed               Past Medical History:  Diagnosis Date   Anemia    history of blood transfusion-no abnormal reaction noted   Asthma    Albuterol as needed   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+, PR+.Takes Tamoxifen daily   Diabetes mellitus without complication ([place])    takes Metformin daily   History of migraine    several yrs ago   Hypertension    takes Tribenzor daily   Past Surgical History:  Procedure Laterality Date   BIOPSY BREAST Left 12/05/12   fibroadenoma   BREAST LUMPECTOMY     2014   BREAST SURGERY Right    CESAREAN SECTION  01/05/2009   CHOLECYSTECTOMY N/A 01/31/2016   Procedure: LAPAROSCOPIC CHOLECYSTECTOMY;  Surgeon: Erroll Luna, MD;  Location: [facility];  Service: General;  Laterality: N/A;   DILITATION & CURRETTAGE/HYSTROSCOPY WITH HYDROTHERMAL ABLATION N/A 08/12/2013   Procedure: DILATATION & CURETTAGE/HYSTEROSCOPY WITH HYDROTHERMAL ABLATION;  Surgeon: Frederico Hamman, MD;  Location: [facility];  Service: Gynecology;  Laterality: N/A;   ROBOTIC ASSISTED TOTAL HYSTERECTOMY N/A 06/18/2017   Procedure: XI ROBOTIC ASSISTED TOTAL HYSTERECTOMY;  Surgeon: Lavonia Drafts, MD;  Location: [facility];  Service: Gynecology;  Laterality: N/A;   SALPINGOOPHORECTOMY Bilateral 06/18/2017   Procedure: BILATERAL SALPINGO OOPHORECTOMY;  Surgeon: Lavonia Drafts, MD;  Location: [facility];   Service: Gynecology;  Laterality: Bilateral;   Patient Active Problem List   Diagnosis Date Noted   Chronic pain of both knees 05/21/2018   Chronic right shoulder pain 05/21/2018   BMI 40.0-44.9, adult ([place]) 05/21/2018   Anemia 06/05/2013   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   Nabothian cyst 12/07/2011   Cervical mass 11/20/2011   Fibroid uterus 11/20/2011    REFERRING DIAG:  M72.2 (ICD-10-CM) - Plantar fasciitis, bilateral  M76.72 (ICD-10-CM) - Peroneal tendinitis, left    THERAPY DIAG:  Pain in left ankle and joints of left foot  Pain in right ankle and joints of right foot  Muscle weakness (generalized)  Other abnormalities of gait and mobility  Rationale for Evaluation and Treatment Rehabilitation  PERTINENT HISTORY:  Breast cancer Diabetes Hypertension   PRECAUTIONS: none   SUBJECTIVE:  SUBJECTIVE STATEMENT:  Pt states that her L foot and her L knee are very painful today   PAIN:  Are you having pain? Yes: NPRS scale: 9/10 Pain location: bilateral plantar aspect of feet Pain description: sharp Aggravating factors: walking,standing Relieving factors: rest    OBJECTIVE: (objective measures completed at initial evaluation unless otherwise dated)  DIAGNOSTIC FINDINGS:  Multiple views x-ray of both feet: no fracture, dislocation, swelling or degenerative changes noted, plantar calcaneal spur, and pes planus   PATIENT SURVEYS:  LEFS 20/80   COGNITION: Overall cognitive status: Within functional limits for tasks assessed                         SENSATION: WFL   EDEMA:  No obvious swelling        POSTURE: Pes planus in standing   PALPATION: TTP bilateral plantar fascia, Lt peroneal tendons    LOWER EXTREMITY ROM:   Active ROM Right eval  Left eval 02/15/22:  Hip flexion       Hip extension       Hip abduction       Hip adduction       Hip internal rotation       Hip external rotation       Knee flexion       Knee extension       Ankle dorsiflexion 0 Lacking 5  Lt: 0 Rt: 5   Ankle plantarflexion WNL WNL   Ankle inversion WNL WNL   Ankle eversion WNL WNL    (Blank rows = not tested)   LOWER EXTREMITY MMT:   MMT Right eval Left eval 02/15/22  Hip flexion 5 5   Hip extension       Hip abduction 3+ 3+   Hip adduction       Hip internal rotation       Hip external rotation       Knee flexion       Knee extension       Ankle dorsiflexion 5 5   Ankle plantarflexion DL partial calf raise DL partial calf raise  DL partial calf raise  Ankle inversion 4 4   Ankle eversion 4 4    (Blank rows = not tested) *Pain with all MMT bilaterally*   LOWER EXTREMITY SPECIAL TESTS:  Windlass (+)    FUNCTIONAL TESTS:  SLS 1 second bilateral    GAIT: Distance walked: 10 ft  Assistive device utilized: None Level of assistance: Complete Independence Comments: maintain supination during stance, foot flat initial contact, limited push-off, WBOS, limited hip/knee flexion during gait cycle.  [facility] Adult PT Treatment:                                                DATE: 02-28-22      [facility] Adult PT Treatment:                                                DATE: 02/22/2022 Aquatic therapy at [address] - therapeutic pool temp 92 degrees Pt enters building ambulating independently  Treatment took place in water 3.8 to  4 ft 8 in.feet deep depending upon activity.  Pt  entered and exited the pool via stair and handrails independently  Pt pain level 9/10 at initiation of water walking. Patient entered water for aquatic therapy for first time and was introduced to principles and therapeutic effects of water as they ambulated and acclimated to pool.  Therapeutic Exercise: Walking forward/backwards/side stepping x 2  laps each Runners stretch on bottom step x30" BIL Calf stretch edge of bottom step 30''x3 Walking march Walking march with PF in SLS At edge of pool, pt performed LE exercise: Single leg heel raise 2x20 BIL SLS with rotations - yellow dumbells Step up on first step with rails 2x10 fwd x10 lat Hip abd/add x20 BIL  Pt requires the buoyancy of water for active assisted exercises with buoyancy supported for strengthening and AROM exercises. Hydrostatic pressure also supports joints by unweighting joint load by at least 50 % in 3-4 feet depth water. 80% in chest to neck deep water. Water will provide assistance with movement using the current and laminar flow while the buoyancy reduces weight bearing. Pt requires the viscosity of the water for resistance with strengthening exercises.  [facility] Adult PT Treatment:                                                DATE: 02/15/22 Therapeutic Exercise: NuStep level 6 x 5 minutes  Calf stretch on wedge x 60 seconds each gastroc/soleus  Ankle rockerboard 2 x 10; A/P and lateral  Sidelying hip abduction 2 x 10  Hip bridge 2 x 10  SLR 2 x 10  Updated HEP   [facility] Adult PT Treatment:                                                DATE: 02/02/2022 Aquatic therapy at [address] - therapeutic pool temp 92 degrees Pt enters building ambulating independently  Treatment took place in water 3.8 to  4 ft 8 in.feet deep depending upon activity.  Pt entered and exited the pool via stair and handrails independently  Pt pain level 9/10 at initiation of water walking. Patient entered water for aquatic therapy for first time and was introduced to principles and therapeutic effects of water as they ambulated and acclimated to pool.  Therapeutic Exercise: Walking forward/backwards/side stepping x 2 laps each Runners stretch on bottom step x30" BIL Hamstring stretch on bottom step x30" BIL Calf stretch lunge position 2x45" BIL Soleus stretch lunge  position 2x45" BIL Calf stretch edge of bottom step x1' At edge of pool, pt performed LE exercise: Heel raise 2x20 Single leg heel raise x20 BIL Toe raises 2x20 Hip abd/add x20 BIL  Pt requires the buoyancy of water for active assisted exercises with buoyancy supported for strengthening and AROM exercises. Hydrostatic pressure also supports joints by unweighting joint load by at least 50 % in 3-4 feet depth water. 80% in chest to neck deep water. Water will provide assistance with movement using the current and laminar flow while the buoyancy reduces weight bearing. Pt requires the viscosity of the water for resistance with strengthening exercises.   [facility] Adult PT Treatment:  DATE: 01/24/22 Therapeutic Exercise: NuStep level 5 x 5 minutes LE  4 way ankle green band 2 x 10  Seated calf raise 10#; 2 x 15; on step to allow more eccentric work Updated HEP  Manual Therapy: STM bilateral gastroc, plantar fascia Passive calf and great toe flexor stretching bilaterally          PATIENT EDUCATION:  Education details: see treatment Person educated: Patient Education method: Explanation, Demonstration, Tactile cues, Verbal cues, and Handouts Education comprehension: verbalized understanding, returned demonstration, verbal cues required, tactile cues required, and needs further education   HOME EXERCISE PROGRAM: Access Code: [email or web address] URL: [email or web address] Date: 01/18/2022 Prepared by: Gwendolyn Grant   Exercises - Seated Calf Stretch with Strap  - 2 x daily - 7 x weekly - 3 sets - 30 sec  hold - Seated Great Toe Extension  - 2 x daily - 7 x weekly - 2 sets - 10 reps - Arch Lifting  - 2 x daily - 7 x weekly - 2 sets - 10 reps   ASSESSMENT:   CLINICAL IMPRESSION: Session today focused on improving comfort with SLS as well as general LE strengthening/endurance in the aquatic environment for use of buoyancy to offload joints  and the viscosity of water as resistance during therapeutic exercise.  Renee tolerates session well and reduced w/b in water helps to normalize gait and comfort during ambulation.  She shows overall endurance, local (ankle and R knee) and global strength deficit, and balance deficit L>R.  Patient was able to tolerate all prescribed exercises in the aquatic environment with no adverse effects and reports 8/10 pain at the end of the session. Patient continues to benefit from skilled PT services on land and aquatic based and should be progressed as able to improve functional independence.       OBJECTIVE IMPAIRMENTS: Abnormal gait, decreased activity tolerance, decreased balance, decreased knowledge of condition, difficulty walking, decreased ROM, decreased strength, increased fascial restrictions, improper body mechanics, postural dysfunction, and pain.    ACTIVITY LIMITATIONS: carrying, lifting, bending, standing, squatting, stairs, transfers, and locomotion level   PARTICIPATION LIMITATIONS: meal prep, cleaning, laundry, shopping, community activity, and occupation   PERSONAL FACTORS: Age, Fitness, Profession, Time since onset of injury/illness/exacerbation, and 3+ comorbidities: see PMH above  are also affecting patient's functional outcome.    REHAB POTENTIAL: Fair chronicity of injury   CLINICAL DECISION MAKING: Stable/uncomplicated   EVALUATION COMPLEXITY: Low     GOALS: Goals reviewed with patient? Yes   SHORT TERM GOALS: Target date: 02/15/22 Patient will be independent and compliant with initial HEP.    Baseline: issued at eval  Goal status: met   2.  Patient will improve bilateral ankle DF AROM by at least 5 degrees to improve gait mechanics.  Baseline: see above  Goal status: met   3.  Patient will be able to perform full range DL calf raise to improve push-off during gait cycle.  Baseline: see above  Goal status: ongoing      LONG TERM GOALS: Target date: 03/15/22    Patient will maintain SLS for at least 5 seconds to improve gait stability.  Baseline: see above  Goal status: INITIAL   2.  Patient will demonstrate 5/5 bilateral ankle strength to improve stability when navigating on uneven terrain.  Baseline: see above  Goal status: INITIAL   3.  Patient will demonstrate at least 4+/5 bilateral hip abductor strength to improve stability about the chain with prolonged walking/standing activity.  Baseline: see above  Goal status: INITIAL   4.  Patient will score at least 40/80 on the LEFS to signify clinically meaningful improvement in functional abilities.  Baseline: see above  Goal status: INITIAL   5.  Patient will report pain at worst rated as </= 5/10 to reduce her current functional limitations.  Baseline: see above  Goal status: INITIAL       PLAN:   PT FREQUENCY: 1x/week   PT DURATION: 8 weeks   PLANNED INTERVENTIONS: Therapeutic exercises, Therapeutic activity, Neuromuscular re-education, Balance training, Gait training, Patient/Family education, Self Care, Joint mobilization, Aquatic Therapy, Dry Needling, Cryotherapy, Moist heat, Manual therapy, and Re-evaluation   PLAN FOR NEXT SESSION: review and progress HEP, foot intrinsics, ankle/hip strengthening, calf stretching, consider TPDN.   ***

## 2022-02-27 NOTE — Patient Instructions (Signed)
Shoes I like: Lorenza Chick

## 2022-02-28 ENCOUNTER — Encounter: Payer: Self-pay | Admitting: Physical Therapy

## 2022-02-28 ENCOUNTER — Ambulatory Visit: Payer: Medicaid Other | Admitting: Physical Therapy

## 2022-02-28 DIAGNOSIS — M25572 Pain in left ankle and joints of left foot: Secondary | ICD-10-CM

## 2022-02-28 DIAGNOSIS — M6281 Muscle weakness (generalized): Secondary | ICD-10-CM

## 2022-02-28 DIAGNOSIS — M25571 Pain in right ankle and joints of right foot: Secondary | ICD-10-CM

## 2022-02-28 DIAGNOSIS — R2689 Other abnormalities of gait and mobility: Secondary | ICD-10-CM

## 2022-03-01 ENCOUNTER — Ambulatory Visit: Payer: Medicaid Other | Admitting: Physical Therapy

## 2022-03-01 NOTE — Progress Notes (Signed)
  Subjective:  Patient ID: Sheryl Cox, female    DOB: 1970-08-21,  MRN: [id number]  Chief Complaint  Patient presents with   Tendonitis    8 week check plantar fasciitis, re-check peroneal tendinitis.    51 y.o. female presents with the above complaint. History confirmed with patient.  She has had quite a bit of improvement Objective:  Physical Exam: warm, good capillary refill, no trophic changes or ulcerative lesions, normal DP and PT pulses, normal sensory exam, tinea pedis, and little to no heel pain or perineal pain today Radiographs: Multiple views x-ray of both feet: no fracture, dislocation, swelling or degenerative changes noted, plantar calcaneal spur, and pes planus Assessment:   1. Plantar fasciitis, bilateral   2. Peroneal tendinitis, left       Plan:  Patient was evaluated and treated and all questions answered.  Overall she is doing much better.  She will continue her physical therapy.  I will see her back in 3 months for final follow-up.  Return in about 3 months (around 05/29/2022) for re-check peroneal tendinitis, recheck plantar fasciitis.

## 2022-03-08 ENCOUNTER — Encounter: Payer: Self-pay | Admitting: Physical Therapy

## 2022-03-08 ENCOUNTER — Ambulatory Visit: Payer: Medicaid Other | Admitting: Physical Therapy

## 2022-03-08 DIAGNOSIS — M6281 Muscle weakness (generalized): Secondary | ICD-10-CM

## 2022-03-08 DIAGNOSIS — M25572 Pain in left ankle and joints of left foot: Secondary | ICD-10-CM

## 2022-03-08 DIAGNOSIS — M25571 Pain in right ankle and joints of right foot: Secondary | ICD-10-CM

## 2022-03-08 NOTE — Therapy (Signed)
OUTPATIENT PHYSICAL THERAPY TREATMENT NOTE   Patient Name: Sheryl Cox MRN: [id number] DOB:February 08, 1971, 51 y.o., female Today's Date: 03/08/2022  PCP: Lucianne Lei, MD  REFERRING PROVIDER: Criselda Peaches, DPM   END OF SESSION:   PT End of Session - 03/08/22 1618     Visit Number 7    Number of Visits 9    Date for PT Re-Evaluation 03/17/22    Authorization Type [employer] MCD; 27 VL    PT Start Time 0415    PT Stop Time 0456    PT Time Calculation (min) 41 min    Activity Tolerance Patient tolerated treatment well    Behavior During Therapy [facility] for tasks assessed/performed               Past Medical History:  Diagnosis Date   Anemia    history of blood transfusion-no abnormal reaction noted   Asthma    Albuterol as needed   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+, PR+.Takes Tamoxifen daily   Diabetes mellitus without complication ([place])    takes Metformin daily   History of migraine    several yrs ago   Hypertension    takes Tribenzor daily   Past Surgical History:  Procedure Laterality Date   BIOPSY BREAST Left 12/05/12   fibroadenoma   BREAST LUMPECTOMY     2014   BREAST SURGERY Right    CESAREAN SECTION  01/05/2009   CHOLECYSTECTOMY N/A 01/31/2016   Procedure: LAPAROSCOPIC CHOLECYSTECTOMY;  Surgeon: Erroll Luna, MD;  Location: [place];  Service: General;  Laterality: N/A;   DILITATION & CURRETTAGE/HYSTROSCOPY WITH HYDROTHERMAL ABLATION N/A 08/12/2013   Procedure: DILATATION & CURETTAGE/HYSTEROSCOPY WITH HYDROTHERMAL ABLATION;  Surgeon: Frederico Hamman, MD;  Location: [facility];  Service: Gynecology;  Laterality: N/A;   ROBOTIC ASSISTED TOTAL HYSTERECTOMY N/A 06/18/2017   Procedure: XI ROBOTIC ASSISTED TOTAL HYSTERECTOMY;  Surgeon: Lavonia Drafts, MD;  Location: [facility];  Service: Gynecology;  Laterality: N/A;   SALPINGOOPHORECTOMY Bilateral 06/18/2017   Procedure: BILATERAL SALPINGO OOPHORECTOMY;  Surgeon: Lavonia Drafts, MD;  Location: [facility];  Service: Gynecology;  Laterality: Bilateral;   Patient Active Problem List   Diagnosis Date Noted   Chronic pain of both knees 05/21/2018   Chronic right shoulder pain 05/21/2018   BMI 40.0-44.9, adult ([place]) 05/21/2018   Anemia 06/05/2013   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   Nabothian cyst 12/07/2011   Cervical mass 11/20/2011   Fibroid uterus 11/20/2011    REFERRING DIAG:  M72.2 (ICD-10-CM) - Plantar fasciitis, bilateral  M76.72 (ICD-10-CM) - Peroneal tendinitis, left    THERAPY DIAG:  Pain in left ankle and joints of left foot  Pain in right ankle and joints of right foot  Muscle weakness (generalized)  Rationale for Evaluation and Treatment Rehabilitation  PERTINENT HISTORY:  Breast cancer Diabetes Hypertension   PRECAUTIONS: none   SUBJECTIVE:  SUBJECTIVE STATEMENT:  Pt reports that aquatic therapy has been helpful.  She wonders if dry needling might be helpful.  She is now able to bend over and pick up objects.   PAIN:  Are you having pain? Yes: NPRS scale: 8/10 Pain location: bilateral plantar aspect of feet Pain description: sharp Aggravating factors: walking,standing Relieving factors: rest    OBJECTIVE: (objective measures completed at initial evaluation unless otherwise dated)  DIAGNOSTIC FINDINGS:  Multiple views x-ray of both feet: no fracture, dislocation, swelling or degenerative changes noted, plantar calcaneal spur, and pes planus   PATIENT SURVEYS:  LEFS 20/80   COGNITION: Overall cognitive status: Within functional limits for tasks assessed                         SENSATION: WFL   EDEMA:  No obvious swelling        POSTURE: Pes planus in standing   PALPATION: TTP bilateral plantar fascia, Lt peroneal tendons    LOWER  EXTREMITY ROM:   Active ROM Right eval Left eval 02/15/22:  Hip flexion       Hip extension       Hip abduction       Hip adduction       Hip internal rotation       Hip external rotation       Knee flexion       Knee extension       Ankle dorsiflexion 0 Lacking 5  Lt: 0 Rt: 5   Ankle plantarflexion WNL WNL   Ankle inversion WNL WNL   Ankle eversion WNL WNL    (Blank rows = not tested)   LOWER EXTREMITY MMT:   MMT Right eval Left eval 02/15/22  Hip flexion 5 5   Hip extension       Hip abduction 3+ 3+   Hip adduction       Hip internal rotation       Hip external rotation       Knee flexion       Knee extension       Ankle dorsiflexion 5 5   Ankle plantarflexion DL partial calf raise DL partial calf raise  DL partial calf raise  Ankle inversion 4 4   Ankle eversion 4 4    (Blank rows = not tested) *Pain with all MMT bilaterally*   LOWER EXTREMITY SPECIAL TESTS:  Windlass (+)    FUNCTIONAL TESTS:  SLS 1 second bilateral    GAIT: Distance walked: 10 ft  Assistive device utilized: None Level of assistance: Complete Independence Comments: maintain supination during stance, foot flat initial contact, limited push-off, WBOS, limited hip/knee flexion during gait cycle.      [facility] Adult PT Treatment:                                                DATE: 03/08/2022 Aquatic therapy at [address] - therapeutic pool temp 92 degrees Pt enters building ambulating independently  Treatment took place in water 3.8 to  4 ft 8 in.feet deep depending upon activity.  Pt entered and exited the pool via stair and handrails independently  Patient entered water for aquatic therapy for first time and was introduced to principles and therapeutic effects of water as they ambulated and acclimated to pool.  Therapeutic  Exercise: Walking forward/backwards/side stepping x 2 laps each Lateral cross over walking Hip abd with rainbow dumbells Runners stretch on bottom  step x30" BIL Calf stretch edge of bottom step 30''x3 Walking march Walking march with PF in SLS Single leg heel raise 2x20 BIL Water bell alternating shoulder flexion/ext and bil Holding dumbell with hip ext/flex with ankle resistance SLS with rotations - yellow dumbells Step up on first step with rails 2x10 fwd x10 lat Hip abd/add x20 BIL  Pt requires the buoyancy of water for active assisted exercises with buoyancy supported for strengthening and AROM exercises. Hydrostatic pressure also supports joints by unweighting joint load by at least 50 % in 3-4 feet depth water. 80% in chest to neck deep water. Water will provide assistance with movement using the current and laminar flow while the buoyancy reduces weight bearing. Pt requires the viscosity of the water for resistance with strengthening exercises.  [facility] Adult PT Treatment:                                                DATE: 02/15/22 Therapeutic Exercise: NuStep level 6 x 5 minutes  Calf stretch on wedge x 60 seconds each gastroc/soleus  Ankle rockerboard 2 x 10; A/P and lateral  Sidelying hip abduction 2 x 10  Hip bridge 2 x 10  SLR 2 x 10  Updated HEP   [facility] Adult PT Treatment:                                                DATE: 02/02/2022 Aquatic therapy at [address] - therapeutic pool temp 92 degrees Pt enters building ambulating independently  Treatment took place in water 3.8 to  4 ft 8 in.feet deep depending upon activity.  Pt entered and exited the pool via stair and handrails independently  Pt pain level 9/10 at initiation of water walking. Patient entered water for aquatic therapy for first time and was introduced to principles and therapeutic effects of water as they ambulated and acclimated to pool.  Therapeutic Exercise: Walking forward/backwards/side stepping x 2 laps each Runners stretch on bottom step x30" BIL Hamstring stretch on bottom step x30" BIL Calf stretch lunge position 2x45"  BIL Soleus stretch lunge position 2x45" BIL Calf stretch edge of bottom step x1' At edge of pool, pt performed LE exercise: Heel raise 2x20 Single leg heel raise x20 BIL Toe raises 2x20 Hip abd/add x20 BIL  Pt requires the buoyancy of water for active assisted exercises with buoyancy supported for strengthening and AROM exercises. Hydrostatic pressure also supports joints by unweighting joint load by at least 50 % in 3-4 feet depth water. 80% in chest to neck deep water. Water will provide assistance with movement using the current and laminar flow while the buoyancy reduces weight bearing. Pt requires the viscosity of the water for resistance with strengthening exercises.   [facility] Adult PT Treatment:                                                DATE: 01/24/22 Therapeutic Exercise: NuStep level 5  x 5 minutes LE  4 way ankle green band 2 x 10  Seated calf raise 10#; 2 x 15; on step to allow more eccentric work Updated HEP  Manual Therapy: STM bilateral gastroc, plantar fascia Passive calf and great toe flexor stretching bilaterally          PATIENT EDUCATION:  Education details: see treatment Person educated: Patient Education method: Explanation, Demonstration, Tactile cues, Verbal cues, and Handouts Education comprehension: verbalized understanding, returned demonstration, verbal cues required, tactile cues required, and needs further education   HOME EXERCISE PROGRAM: Access Code: [email or web address] URL: [email or web address] Date: 01/18/2022 Prepared by: Gwendolyn Grant   Exercises - Seated Calf Stretch with Strap  - 2 x daily - 7 x weekly - 3 sets - 30 sec  hold - Seated Great Toe Extension  - 2 x daily - 7 x weekly - 2 sets - 10 reps - Arch Lifting  - 2 x daily - 7 x weekly - 2 sets - 10 reps   ASSESSMENT:   CLINICAL IMPRESSION:  Session today focused on improving comfort with SLS as well as general LE strengthening/endurance in the aquatic environment for use of  buoyancy to offload joints and the viscosity of water as resistance during therapeutic exercise.  Renee presents with lower pain and improved balance in today's session.  She reports she is more confident in bending forward at home to pick up objects and feels her endurance is improving.  Patient was able to tolerate all prescribed exercises in the aquatic environment with no adverse effects and reports 6/10 pain at the end of the session. Patient continues to benefit from skilled PT services on land and aquatic based and should be progressed as able to improve functional independence.         OBJECTIVE IMPAIRMENTS: Abnormal gait, decreased activity tolerance, decreased balance, decreased knowledge of condition, difficulty walking, decreased ROM, decreased strength, increased fascial restrictions, improper body mechanics, postural dysfunction, and pain.    ACTIVITY LIMITATIONS: carrying, lifting, bending, standing, squatting, stairs, transfers, and locomotion level   PARTICIPATION LIMITATIONS: meal prep, cleaning, laundry, shopping, community activity, and occupation   PERSONAL FACTORS: Age, Fitness, Profession, Time since onset of injury/illness/exacerbation, and 3+ comorbidities: see PMH above  are also affecting patient's functional outcome.    REHAB POTENTIAL: Fair chronicity of injury   CLINICAL DECISION MAKING: Stable/uncomplicated   EVALUATION COMPLEXITY: Low     GOALS: Goals reviewed with patient? Yes   SHORT TERM GOALS: Target date: 02/15/22 Patient will be independent and compliant with initial HEP.    Baseline: issued at eval  Goal status: met   2.  Patient will improve bilateral ankle DF AROM by at least 5 degrees to improve gait mechanics.  Baseline: see above  Goal status: met   3.  Patient will be able to perform full range DL calf raise to improve push-off during gait cycle.  Baseline: see above  Goal status: ongoing      LONG TERM GOALS: Target date: 03/15/22    Patient will maintain SLS for at least 5 seconds to improve gait stability.  Baseline: see above  Goal status: INITIAL   2.  Patient will demonstrate 5/5 bilateral ankle strength to improve stability when navigating on uneven terrain.  Baseline: see above  Goal status: INITIAL   3.  Patient will demonstrate at least 4+/5 bilateral hip abductor strength to improve stability about the chain with prolonged walking/standing activity.  Baseline: see above  Goal status: INITIAL  4.  Patient will score at least 40/80 on the LEFS to signify clinically meaningful improvement in functional abilities.  Baseline: see above  Goal status: INITIAL   5.  Patient will report pain at worst rated as </= 5/10 to reduce her current functional limitations.  Baseline: see above  Goal status: INITIAL       PLAN:   PT FREQUENCY: 1x/week   PT DURATION: 8 weeks   PLANNED INTERVENTIONS: Therapeutic exercises, Therapeutic activity, Neuromuscular re-education, Balance training, Gait training, Patient/Family education, Self Care, Joint mobilization, Aquatic Therapy, Dry Needling, Cryotherapy, Moist heat, Manual therapy, and Re-evaluation   PLAN FOR NEXT SESSION: review and progress HEP, foot intrinsics, ankle/hip strengthening, calf stretching, consider TPDN.   Kevan Ny Atlee Kluth PT 03/08/22 5:01 PM Phone: [phone] Fax: [phone]

## 2022-03-15 ENCOUNTER — Ambulatory Visit: Payer: Medicaid Other | Admitting: Physical Therapy

## 2022-03-15 ENCOUNTER — Ambulatory Visit: Payer: Medicaid Other

## 2022-03-17 ENCOUNTER — Ambulatory Visit (HOSPITAL_COMMUNITY)
Admission: EM | Admit: 2022-03-17 | Discharge: 2022-03-17 | Disposition: A | Discharge status: Home / Self Care | Payer: Medicaid Other | Attending: Physician Assistant | Admitting: Physician Assistant

## 2022-03-17 ENCOUNTER — Encounter (HOSPITAL_COMMUNITY): Payer: Self-pay

## 2022-03-17 DIAGNOSIS — U071 COVID-19: Secondary | ICD-10-CM | POA: Diagnosis not present

## 2022-03-17 DIAGNOSIS — R051 Acute cough: Secondary | ICD-10-CM

## 2022-03-17 DIAGNOSIS — J069 Acute upper respiratory infection, unspecified: Secondary | ICD-10-CM

## 2022-03-17 LAB — POC INFLUENZA A AND B ANTIGEN (URGENT CARE ONLY)
INFLUENZA A ANTIGEN, POC: NEGATIVE
INFLUENZA B ANTIGEN, POC: NEGATIVE

## 2022-03-17 LAB — SARS CORONAVIRUS 2 (TAT 6-24 HRS): SARS Coronavirus 2: POSITIVE — AB

## 2022-03-17 MED ORDER — FLUTICASONE PROPIONATE 50 MCG/ACT NA SUSP: 1.0000 | Freq: Every day | NASAL | 0 refills | Status: DC

## 2022-03-17 MED ORDER — PROMETHAZINE-DM 6.25-15 MG/5ML PO SYRP: 5.0000 mL | ORAL_SOLUTION | Freq: Two times a day (BID) | ORAL | 0 refills | Status: DC | PRN

## 2022-03-17 NOTE — Discharge Instructions (Signed)
You tested negative for flu.  We will contact you if you are positive for COVID.  Please monitor your MyChart for these results.  Use over-the-counter medication including Mucinex, Flonase, Tylenol, ibuprofen as needed for your symptoms.  I recommend a humidifier and nasal saline.  Use Promethazine DM for cough.  This make you sleepy so do not drive or drink alcohol while taking it.  If your symptoms are proving within a week please return for reevaluation.  If anything worsens you should be seen immediately.

## 2022-03-17 NOTE — ED Triage Notes (Signed)
Pt is here for cough, runny nose, nasal congestion, sore throat body aches, chills, low energy x 3days

## 2022-03-17 NOTE — ED Notes (Signed)
Provider d/c order for covid

## 2022-03-17 NOTE — ED Provider Notes (Signed)
[place]    CSN: [id number] Arrival date & time: 03/17/22  1003      History   Chief Complaint Chief Complaint  Patient presents with   Cough    HPI Sheryl Cox is a 51 y.o. female.   Patient presents today with a 2-day history of URI symptoms including cough, runny nose, nasal congestion, sore throat, body aches, chills, fatigue.  Denies any chest pain, shortness of breath, nausea, vomiting, diarrhea.  She has been taking emergency without improvement of symptoms.  Denies known sick contacts but does not know what they were ultimately diagnosed with.  She is up-to-date on her COVID vaccines as well as influenza vaccine.  She has had COVID several years ago.  She denies any recent antibiotics or steroids.  She does have a history of malignancy but is not currently receiving chemotherapy.  She does report history of diabetes but reports her blood sugars adequately controlled (last A1c was 10/20/2021 and 6.6%).  She has a history of asthma but has not required her albuterol inhaler more frequently since symptoms began.  Denies history of smoking.    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   Nabothian cyst 12/07/2011   Cervical mass 11/20/2011   Fibroid uterus 11/20/2011    Past Surgical History:  Procedure Laterality Date   BIOPSY BREAST Left 12/05/12   fibroadenoma   BREAST  LUMPECTOMY     2014   BREAST SURGERY Right    CESAREAN SECTION  01/05/2009   CHOLECYSTECTOMY N/A 01/31/2016   Procedure: LAPAROSCOPIC CHOLECYSTECTOMY;  Surgeon: Erroll Luna, MD;  Location: [facility];  Service: General;  Laterality: N/A;   DILITATION & CURRETTAGE/HYSTROSCOPY WITH HYDROTHERMAL ABLATION N/A 08/12/2013   Procedure: DILATATION & CURETTAGE/HYSTEROSCOPY WITH HYDROTHERMAL ABLATION;  Surgeon: Frederico Hamman, MD;  Location: [facility];  Service: Gynecology;  Laterality: N/A;   ROBOTIC ASSISTED TOTAL HYSTERECTOMY N/A 06/18/2017   Procedure: XI ROBOTIC ASSISTED TOTAL HYSTERECTOMY;  Surgeon: Lavonia Drafts, MD;  Location: [facility];  Service: Gynecology;  Laterality: N/A;   SALPINGOOPHORECTOMY Bilateral 06/18/2017   Procedure: BILATERAL SALPINGO OOPHORECTOMY;  Surgeon: Lavonia Drafts, MD;  Location: [facility];  Service: Gynecology;  Laterality: Bilateral;    OB History     Gravida  1   Para  1   Term  1   Preterm  0   AB  0   Living  1      SAB  0   IAB  0   Ectopic  0   Multiple  0   Live Births  1            Home Medications    Prior to Admission medications   Medication Sig Start Date End Date Taking? Authorizing Provider  fluticasone (FLONASE) 50 MCG/ACT nasal spray Place 1 spray into both  nostrils daily. 03/17/22  Yes Eliceo Gladu K, PA-C  promethazine-dextromethorphan (PROMETHAZINE-DM) 6.25-15 MG/5ML syrup Take 5 mLs by mouth 2 (two) times daily as needed for cough. 03/17/22  Yes Kamon Fahr, Derry Skill, PA-C  acetaminophen (TYLENOL) 325 MG tablet Take 650 mg by mouth daily as needed for headache. Patient not taking: Reported on 05/15/2021    [provider]  acetaminophen-codeine 120-12 MG/5ML solution TAKE 5ML BY MOUTH AT BEDTIME AS NEEDED    [provider]  albuterol (VENTOLIN HFA) 108 (90 Base) MCG/ACT inhaler Inhale 1-2 puffs into the lungs every 6 (six) hours as needed for wheezing or shortness of breath. Patient not taking:  Reported on 05/15/2021 09/21/19   Wieters, Madelynn Done C, PA-C  ALPRAZolam (XANAX) 0.25 MG tablet TAKE 1 TABLET BY MOUTH TWICE A DAY AS NEEDED FOR ANXIETY 08/25/18   Lucianne Lei, MD  amLODipine (NORVASC) 10 MG tablet Take 10 mg by mouth daily. 02/23/17   [provider]  Ascorbic Acid (VITAMIN C PO) Take by mouth.    [provider]  benzonatate (TESSALON) 100 MG capsule Take 1 capsule (100 mg total) by mouth 3 (three) times daily as needed for cough. 05/24/21   Barrett Henle, MD  celecoxib (CELEBREX) 200 MG capsule Take 1 capsule (200 mg total) by mouth 2 (two) times daily. 10/20/19   Margarita Mail, PA-C  cyclobenzaprine (FLEXERIL) 10 MG tablet Take 0.5-1 tablets (5-10 mg total) by mouth 2 (two) times daily as needed for muscle spasms. 10/20/19   Margarita Mail, PA-C  diclofenac Sodium (VOLTAREN) 1 % GEL Apply 4 g topically 4 (four) times daily. 07/30/20   Fondaw, Wylder S, PA  ELDERBERRY PO Take by mouth. Patient not taking: Reported on 05/15/2021    [provider]  erythromycin ophthalmic ointment Place a 1/2 inch ribbon of ointment into the lower eyelid. 09/18/21   Ward, Lenise Arena, PA-C  hydrochlorothiazide (HYDRODIURIL) 12.5 MG tablet Take 2 tablets (25 mg total) by mouth daily. Patient taking differently: Take 12.5 mg by mouth daily. 05/14/18   Scot Jun, FNP  meclizine (ANTIVERT) 12.5 MG tablet Take 1 tablet (12.5 mg total) by mouth 3 (three) times daily as needed for dizziness. Patient not taking: Reported on 05/15/2021 03/25/21   Hughie Closs, PA-C  metFORMIN (GLUCOPHAGE) 500 MG tablet Take 1 tablet (500 mg total) by mouth 2 (two) times daily with a meal. Patient taking differently: Take 500 mg by mouth daily. 06/19/17   Lavonia Drafts, MD  methocarbamol (ROBAXIN) 500 MG tablet Take 1 tablet (500 mg total) by mouth 2 (two) times daily. 07/30/20   Tedd Sias, PA  Multiple Vitamins-Minerals (MULTIVITAMIN PO) Take 1 tablet by mouth daily. Patient not  taking: Reported on 05/15/2021    [provider]  ondansetron (ZOFRAN) 4 MG tablet Take 1 tablet (4 mg total) by mouth every 6 (six) hours. 04/06/20   Hughie Closs, PA-C  pantoprazole (PROTONIX) 20 MG tablet TAKE 1 TABLET BY MOUTH EVERY DAY 12/29/19   Nicholas Lose, MD  Semaglutide (RYBELSUS) 7 MG TABS Take by mouth.    [provider]  tamoxifen (NOLVADEX) 20 MG tablet TAKE 1 TABLET BY MOUTH EVERY DAY 05/15/21   Nicholas Lose, MD  tamoxifen (NOLVADEX) 20 MG tablet Take 1 tablet (20 mg total) by mouth daily. Patient not taking: Reported on 05/15/2021 05/15/21   Nicholas Lose, MD  tiZANidine (ZANAFLEX) 4 MG tablet Take 1 tablet (4 mg total) by mouth at bedtime. 04/19/20   Bess Harvest,  Freida Busman, PA-C  traMADol (ULTRAM) 50 MG tablet SMARTSIG:1 Tablet(s) By Mouth Every 12 Hours PRN 10/26/19   [provider]  Vitamin D, Ergocalciferol, (DRISDOL) 1.25 MG (50000 UNIT) CAPS capsule Take 50,000 Units by mouth once a week. 10/26/19   [provider]    Family History Family History  Problem Relation Age of Onset   Hypertension Mother    Diabetes Mother    COPD Mother    Breast cancer Paternal Grandmother        dx in her 39s   Prostate cancer Paternal Grandfather    Prostate cancer Father 81   Prostate cancer Paternal Uncle    Breast cancer Other        paternal grandmother's sister   Breast cancer Paternal Aunt    Anesthesia problems Neg Hx    Amblyopia Neg Hx    Blindness Neg Hx    Cataracts Neg Hx    Glaucoma Neg Hx    Macular degeneration Neg Hx    Retinal detachment Neg Hx    Strabismus Neg Hx    Retinitis pigmentosa Neg Hx     Social History Social History   Tobacco Use   Smoking status: Never   Smokeless tobacco: Never  Vaping Use   Vaping Use: Never used  Substance Use Topics   Alcohol use: Yes    Alcohol/week: 1.0 standard drink of alcohol    Types: 1 Cans of beer per week    Comment: occa   Drug use: No     Allergies   Shrimp extract  allergy skin test, Shrimp [shellfish allergy], and Aleve [naproxen]   Review of Systems Review of Systems  Constitutional:  Positive for activity change and chills. Negative for appetite change, fatigue and fever.  HENT:  Positive for congestion, sinus pressure and sore throat. Negative for sneezing.   Respiratory:  Positive for cough. Negative for shortness of breath.   Cardiovascular:  Negative for chest pain.  Gastrointestinal:  Negative for abdominal pain, diarrhea, nausea and vomiting.  Musculoskeletal:  Negative for arthralgias and myalgias.  Neurological:  Positive for headaches. Negative for dizziness and light-headedness.     Physical Exam Triage Vital Signs ED Triage Vitals  Enc Vitals Group     BP 03/17/22 1024 (!) 156/93     Pulse Rate 03/17/22 1024 (!) 103     Resp 03/17/22 1024 12     Temp 03/17/22 1024 98.4 F (36.9 C)     Temp Source 03/17/22 1024 Oral     SpO2 03/17/22 1024 96 %     Weight --      Height --      Head Circumference --      Peak Flow --      Pain Score 03/17/22 1022 7     Pain Loc --      Pain Edu? --      Excl. in [place]? --    No data found.  Updated Vital Signs BP (!) 156/93 (BP Location: Right Arm)   Pulse (!) 103   Temp 98.4 F (36.9 C) (Oral)   Resp 12   LMP 05/27/2017 (Approximate)   SpO2 96%   Visual Acuity Right Eye Distance:   Left Eye Distance:   Bilateral Distance:    Right Eye Near:   Left Eye Near:    Bilateral Near:     Physical Exam Vitals reviewed.  Constitutional:      General: She is awake. She is not in  acute distress.    Appearance: Normal appearance. She is well-developed. She is not ill-appearing.     Comments: Very pleasant female appears stated age in no acute distress sitting comfortably in exam room  HENT:     Head: Normocephalic and atraumatic.     Right Ear: Tympanic membrane, ear canal and external ear normal. Tympanic membrane is not erythematous or bulging.     Left Ear: Tympanic membrane, ear  canal and external ear normal. Tympanic membrane is not erythematous or bulging.     Nose:     Right Sinus: Maxillary sinus tenderness present. No frontal sinus tenderness.     Left Sinus: Maxillary sinus tenderness present. No frontal sinus tenderness.     Mouth/Throat:     Pharynx: Uvula midline. Posterior oropharyngeal erythema present. No oropharyngeal exudate.  Cardiovascular:     Rate and Rhythm: Normal rate and regular rhythm.     Heart sounds: Normal heart sounds, S1 normal and S2 normal. No murmur heard. Pulmonary:     Effort: Pulmonary effort is normal.     Breath sounds: Normal breath sounds. No wheezing, rhonchi or rales.     Comments: Clear to auscultation bilaterally Psychiatric:        Behavior: Behavior is cooperative.      UC Treatments / Results  Labs (all labs ordered are listed, but only abnormal results are displayed) Labs Reviewed  SARS CORONAVIRUS 2 (TAT 6-24 HRS)  POC INFLUENZA A AND B ANTIGEN (URGENT CARE ONLY)    EKG   Radiology No results found.  Procedures Procedures (including critical care time)  Medications Ordered in UC Medications - No data to display  Initial Impression / Assessment and Plan / UC Course  I have reviewed the triage vital signs and the nursing notes.  Pertinent labs & imaging results that were available during my care of the patient were reviewed by me and considered in my medical decision making (see chart for details).     No evidence of acute infection on physical exam that would warrant initiation of antibiotics.  Discussed likely viral etiology.  Influenza testing was negative in clinic today.  COVID testing was obtained and is pending.  Given her history and age she is a candidate for antiviral therapy.  Because she takes alprazolam and tramadol daily she is not a candidate for Paxlovid and would recommend molnupiravir instead.  She was prescribed Promethazine DM for cough.  Discussed this can be sedating and she is  not to drive or drink alcohol while taking it.  Recommended that she rest and drink plenty of fluid.  She can use over-the-counter medication including Mucinex, Flonase, Tylenol for symptom relief.  Discussed that if her symptoms are improving by next week she should return for reevaluation.  If she has any worsening symptoms including chest pain, shortness of breath, fever not responding to medication, weakness, nausea/vomiting she needs to be seen immediately.  Strict return precautions given.  Work excuse note with current CDC return to work guidelines based on COVID test result provided during visit today.  Final Clinical Impressions(s) / UC Diagnoses   Final diagnoses:  Upper respiratory tract infection, unspecified type  Acute cough     Discharge Instructions      You tested negative for flu.  We will contact you if you are positive for COVID.  Please monitor your MyChart for these results.  Use over-the-counter medication including Mucinex, Flonase, Tylenol, ibuprofen as needed for your symptoms.  I recommend a  humidifier and nasal saline.  Use Promethazine DM for cough.  This make you sleepy so do not drive or drink alcohol while taking it.  If your symptoms are proving within a week please return for reevaluation.  If anything worsens you should be seen immediately.     ED Prescriptions     Medication Sig Dispense Auth. Provider   promethazine-dextromethorphan (PROMETHAZINE-DM) 6.25-15 MG/5ML syrup Take 5 mLs by mouth 2 (two) times daily as needed for cough. 118 mL Camela Wich K, PA-C   fluticasone (FLONASE) 50 MCG/ACT nasal spray Place 1 spray into both nostrils daily. 16 g Wave Calzada K, PA-C      PDMP not reviewed this encounter.   Terrilee Croak, PA-C 03/17/22 1114

## 2022-03-18 ENCOUNTER — Telehealth (HOSPITAL_COMMUNITY): Payer: Self-pay | Admitting: Emergency Medicine

## 2022-03-18 ENCOUNTER — Ambulatory Visit: Payer: Self-pay

## 2022-03-18 MED ORDER — MOLNUPIRAVIR EUA 200MG CAPSULE: 4.0000 | ORAL_CAPSULE | Freq: Two times a day (BID) | ORAL | 0 refills | Status: AC

## 2022-03-20 ENCOUNTER — Ambulatory Visit: Payer: Medicaid Other | Attending: Podiatry

## 2022-03-20 ENCOUNTER — Telehealth: Payer: Self-pay

## 2022-03-20 DIAGNOSIS — M25571 Pain in right ankle and joints of right foot: Secondary | ICD-10-CM | POA: Insufficient documentation

## 2022-03-20 DIAGNOSIS — M25572 Pain in left ankle and joints of left foot: Secondary | ICD-10-CM | POA: Insufficient documentation

## 2022-03-20 DIAGNOSIS — M6281 Muscle weakness (generalized): Secondary | ICD-10-CM | POA: Insufficient documentation

## 2022-03-20 DIAGNOSIS — R2689 Other abnormalities of gait and mobility: Secondary | ICD-10-CM | POA: Insufficient documentation

## 2022-03-20 NOTE — Telephone Encounter (Signed)
Spoke with patient regarding missed PT appointment. She was recently diagnosed with COVID. Appointment was rescheduled.   Gwendolyn Grant, PT, DPT, ATC 03/20/22 1:17 PM

## 2022-03-20 NOTE — Therapy (Incomplete)
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+, PR+.Takes Tamoxifen daily   Diabetes mellitus without complication ([place])    takes Metformin daily   History of migraine    several yrs ago   Hypertension    takes Tribenzor daily   Past Surgical History:  Procedure Laterality Date   BIOPSY BREAST Left 12/05/12   fibroadenoma   BREAST LUMPECTOMY     2014   BREAST SURGERY Right    CESAREAN SECTION  01/05/2009   CHOLECYSTECTOMY N/A 01/31/2016   Procedure: LAPAROSCOPIC CHOLECYSTECTOMY;  Surgeon: Erroll Luna, MD;  Location: [facility];  Service: General;  Laterality: N/A;   DILITATION & CURRETTAGE/HYSTROSCOPY WITH HYDROTHERMAL ABLATION N/A 08/12/2013   Procedure: DILATATION & CURETTAGE/HYSTEROSCOPY WITH HYDROTHERMAL ABLATION;  Surgeon: Frederico Hamman, MD;  Location: [facility];  Service: Gynecology;  Laterality: N/A;   ROBOTIC ASSISTED TOTAL HYSTERECTOMY N/A 06/18/2017   Procedure: XI ROBOTIC ASSISTED TOTAL HYSTERECTOMY;  Surgeon: Lavonia Drafts, MD;  Location: [facility];  Service: Gynecology;  Laterality: N/A;   SALPINGOOPHORECTOMY Bilateral 06/18/2017   Procedure: BILATERAL SALPINGO OOPHORECTOMY;  Surgeon: Lavonia Drafts, MD;  Location: [facility];  Service: Gynecology;  Laterality: Bilateral;   Patient Active Problem List   Diagnosis Date Noted   Chronic pain of both knees 05/21/2018   Chronic right shoulder pain 05/21/2018   BMI 40.0-44.9, adult ([place]) 05/21/2018   Anemia 06/05/2013   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   Nabothian cyst 12/07/2011   Cervical mass 11/20/2011    Fibroid uterus 11/20/2011    REFERRING DIAG:  M72.2 (ICD-10-CM) - Plantar fasciitis, bilateral  M76.72 (ICD-10-CM) - Peroneal tendinitis, left    THERAPY DIAG:  No diagnosis found.  Rationale for Evaluation and Treatment Rehabilitation  PERTINENT HISTORY:  Breast cancer Diabetes Hypertension   PRECAUTIONS: none   SUBJECTIVE:                                                                                                                                                                                      SUBJECTIVE STATEMENT:  Pt reports that aquatic therapy has been helpful.  She wonders if dry needling might be helpful.  She is now able to bend over and pick up objects.  PAIN:  Are you having pain? Yes: NPRS scale: 8/10 Pain location: bilateral plantar aspect of feet Pain description: sharp Aggravating factors: walking,standing Relieving factors: rest    OBJECTIVE: (objective measures completed at initial evaluation unless otherwise dated)  DIAGNOSTIC FINDINGS:  Multiple views x-ray of both feet: no fracture, dislocation, swelling or degenerative changes noted, plantar calcaneal spur, and pes planus   PATIENT SURVEYS:  LEFS 20/80   COGNITION: Overall cognitive status: Within functional limits for tasks assessed                         SENSATION: WFL   EDEMA:  No obvious swelling        POSTURE: Pes planus in standing   PALPATION: TTP bilateral plantar fascia, Lt peroneal tendons    LOWER EXTREMITY ROM:   Active ROM Right eval Left eval 02/15/22:  Hip flexion       Hip extension       Hip abduction       Hip adduction       Hip internal rotation       Hip external rotation       Knee flexion       Knee extension       Ankle dorsiflexion 0 Lacking 5  Lt: 0 Rt: 5   Ankle plantarflexion WNL WNL   Ankle inversion WNL WNL   Ankle eversion WNL WNL    (Blank rows = not tested)   LOWER EXTREMITY MMT:   MMT Right eval Left eval 02/15/22  Hip  flexion 5 5   Hip extension       Hip abduction 3+ 3+   Hip adduction       Hip internal rotation       Hip external rotation       Knee flexion       Knee extension       Ankle dorsiflexion 5 5   Ankle plantarflexion DL partial calf raise DL partial calf raise  DL partial calf raise  Ankle inversion 4 4   Ankle eversion 4 4    (Blank rows = not tested) *Pain with all MMT bilaterally*   LOWER EXTREMITY SPECIAL TESTS:  Windlass (+)    FUNCTIONAL TESTS:  SLS 1 second bilateral    GAIT: Distance walked: 10 ft  Assistive device utilized: None Level of assistance: Complete Independence Comments: maintain supination during stance, foot flat initial contact, limited push-off, WBOS, limited hip/knee flexion during gait cycle.      [facility] Adult PT Treatment:                                                DATE: 03/08/2022 Aquatic therapy at [address] - therapeutic pool temp 92 degrees Pt enters building ambulating independently  Treatment took place in water 3.8 to  4 ft 8 in.feet deep depending upon activity.  Pt entered and exited the pool via stair and handrails independently  Patient entered water for aquatic therapy for first time and was introduced to principles and therapeutic effects of water as they ambulated and acclimated to pool.  Therapeutic Exercise: Walking forward/backwards/side stepping x 2 laps each Lateral cross over walking Hip abd with rainbow dumbells Runners stretch on bottom step x30" BIL Calf stretch edge of bottom step 30''x3 Walking march Walking  march with PF in SLS Single leg heel raise 2x20 BIL Water bell alternating shoulder flexion/ext and bil Holding dumbell with hip ext/flex with ankle resistance SLS with rotations - yellow dumbells Step up on first step with rails 2x10 fwd x10 lat Hip abd/add x20 BIL  Pt requires the buoyancy of water for active assisted exercises with buoyancy supported for strengthening and AROM exercises.  Hydrostatic pressure also supports joints by unweighting joint load by at least 50 % in 3-4 feet depth water. 80% in chest to neck deep water. Water will provide assistance with movement using the current and laminar flow while the buoyancy reduces weight bearing. Pt requires the viscosity of the water for resistance with strengthening exercises.  [facility] Adult PT Treatment:                                                DATE: 02/15/22 Therapeutic Exercise: NuStep level 6 x 5 minutes  Calf stretch on wedge x 60 seconds each gastroc/soleus  Ankle rockerboard 2 x 10; A/P and lateral  Sidelying hip abduction 2 x 10  Hip bridge 2 x 10  SLR 2 x 10  Updated HEP   [facility] Adult PT Treatment:                                                DATE: 02/02/2022 Aquatic therapy at [address] - therapeutic pool temp 92 degrees Pt enters building ambulating independently  Treatment took place in water 3.8 to  4 ft 8 in.feet deep depending upon activity.  Pt entered and exited the pool via stair and handrails independently  Pt pain level 9/10 at initiation of water walking. Patient entered water for aquatic therapy for first time and was introduced to principles and therapeutic effects of water as they ambulated and acclimated to pool.  Therapeutic Exercise: Walking forward/backwards/side stepping x 2 laps each Runners stretch on bottom step x30" BIL Hamstring stretch on bottom step x30" BIL Calf stretch lunge position 2x45" BIL Soleus stretch lunge position 2x45" BIL Calf stretch edge of bottom step x1' At edge of pool, pt performed LE exercise: Heel raise 2x20 Single leg heel raise x20 BIL Toe raises 2x20 Hip abd/add x20 BIL  Pt requires the buoyancy of water for active assisted exercises with buoyancy supported for strengthening and AROM exercises. Hydrostatic pressure also supports joints by unweighting joint load by at least 50 % in 3-4 feet depth water. 80% in chest to neck deep  water. Water will provide assistance with movement using the current and laminar flow while the buoyancy reduces weight bearing. Pt requires the viscosity of the water for resistance with strengthening exercises.   [facility] Adult PT Treatment:                                                DATE: 01/24/22 Therapeutic Exercise: NuStep level 5 x 5 minutes LE  4 way ankle green band 2 x 10  Seated calf raise 10#; 2 x 15; on step to allow more eccentric work Updated HEP  Manual Therapy: STM  bilateral gastroc, plantar fascia Passive calf and great toe flexor stretching bilaterally          PATIENT EDUCATION:  Education details: see treatment Person educated: Patient Education method: Explanation, Demonstration, Tactile cues, Verbal cues, and Handouts Education comprehension: verbalized understanding, returned demonstration, verbal cues required, tactile cues required, and needs further education   HOME EXERCISE PROGRAM: Access Code: [email or web address] URL: [email or web address] Date: 01/18/2022 Prepared by: Gwendolyn Grant   Exercises - Seated Calf Stretch with Strap  - 2 x daily - 7 x weekly - 3 sets - 30 sec  hold - Seated Great Toe Extension  - 2 x daily - 7 x weekly - 2 sets - 10 reps - Arch Lifting  - 2 x daily - 7 x weekly - 2 sets - 10 reps   ASSESSMENT:   CLINICAL IMPRESSION:  Session today focused on improving comfort with SLS as well as general LE strengthening/endurance in the aquatic environment for use of buoyancy to offload joints and the viscosity of water as resistance during therapeutic exercise.  Sheryl Cox presents with lower pain and improved balance in today's session.  She reports she is more confident in bending forward at home to pick up objects and feels her endurance is improving.  Patient was able to tolerate all prescribed exercises in the aquatic environment with no adverse effects and reports 6/10 pain at the end of the session. Patient continues to benefit from  skilled PT services on land and aquatic based and should be progressed as able to improve functional independence.         OBJECTIVE IMPAIRMENTS: Abnormal gait, decreased activity tolerance, decreased balance, decreased knowledge of condition, difficulty walking, decreased ROM, decreased strength, increased fascial restrictions, improper body mechanics, postural dysfunction, and pain.    ACTIVITY LIMITATIONS: carrying, lifting, bending, standing, squatting, stairs, transfers, and locomotion level   PARTICIPATION LIMITATIONS: meal prep, cleaning, laundry, shopping, community activity, and occupation   PERSONAL FACTORS: Age, Fitness, Profession, Time since onset of injury/illness/exacerbation, and 3+ comorbidities: see PMH above  are also affecting patient's functional outcome.    REHAB POTENTIAL: Fair chronicity of injury   CLINICAL DECISION MAKING: Stable/uncomplicated   EVALUATION COMPLEXITY: Low     GOALS: Goals reviewed with patient? Yes   SHORT TERM GOALS: Target date: 02/15/22 Patient will be independent and compliant with initial HEP.    Baseline: issued at eval  Goal status: met   2.  Patient will improve bilateral ankle DF AROM by at least 5 degrees to improve gait mechanics.  Baseline: see above  Goal status: met   3.  Patient will be able to perform full range DL calf raise to improve push-off during gait cycle.  Baseline: see above  Goal status: ongoing      LONG TERM GOALS: Target date: 03/15/22   Patient will maintain SLS for at least 5 seconds to improve gait stability.  Baseline: see above  Goal status: INITIAL   2.  Patient will demonstrate 5/5 bilateral ankle strength to improve stability when navigating on uneven terrain.  Baseline: see above  Goal status: INITIAL   3.  Patient will demonstrate at least 4+/5 bilateral hip abductor strength to improve stability about the chain with prolonged walking/standing activity.  Baseline: see above  Goal  status: INITIAL   4.  Patient will score at least 40/80 on the LEFS to signify clinically meaningful improvement in functional abilities.  Baseline: see above  Goal status: INITIAL   5.  Patient will report  pain at worst rated as </= 5/10 to reduce her current functional limitations.  Baseline: see above  Goal status: INITIAL       PLAN:   PT FREQUENCY: 1x/week   PT DURATION: 8 weeks   PLANNED INTERVENTIONS: Therapeutic exercises, Therapeutic activity, Neuromuscular re-education, Balance training, Gait training, Patient/Family education, Self Care, Joint mobilization, Aquatic Therapy, Dry Needling, Cryotherapy, Moist heat, Manual therapy, and Re-evaluation   PLAN FOR NEXT SESSION: review and progress HEP, foot intrinsics, ankle/hip strengthening, calf stretching, consider TPDN.   Crissie Figures Jhane Lorio PT 03/20/22 8:39 AM Phone: [phone] Fax: [phone]

## 2022-03-22 ENCOUNTER — Ambulatory Visit: Payer: Medicaid Other | Admitting: Physical Therapy

## 2022-03-29 ENCOUNTER — Ambulatory Visit: Payer: Medicaid Other | Admitting: Physical Therapy

## 2022-03-30 ENCOUNTER — Telehealth: Payer: Self-pay

## 2022-03-30 ENCOUNTER — Ambulatory Visit: Payer: Medicaid Other

## 2022-03-30 NOTE — Telephone Encounter (Signed)
Spoke with patient regarding missed PT appointment. She is at work and forgot about appointment. She has plans to call back and reschedule for a later date.   Gwendolyn Grant, PT, DPT, ATC 03/30/22 12:12 PM

## 2022-04-11 ENCOUNTER — Ambulatory Visit: Payer: Medicaid Other

## 2022-04-11 DIAGNOSIS — M25572 Pain in left ankle and joints of left foot: Secondary | ICD-10-CM

## 2022-04-11 DIAGNOSIS — M6281 Muscle weakness (generalized): Secondary | ICD-10-CM | POA: Diagnosis present

## 2022-04-11 DIAGNOSIS — M25571 Pain in right ankle and joints of right foot: Secondary | ICD-10-CM | POA: Diagnosis present

## 2022-04-11 DIAGNOSIS — R2689 Other abnormalities of gait and mobility: Secondary | ICD-10-CM | POA: Diagnosis present

## 2022-04-11 NOTE — Therapy (Signed)
OUTPATIENT PHYSICAL THERAPY TREATMENT NOTE/RE-CERTIFICATION   Patient Name: Sheryl Cox MRN: [id number] DOB:23-Jun-1970, 52 y.o., female Today's Date: 04/12/2022  PCP: Lucianne Lei, MD  REFERRING PROVIDER: Criselda Peaches, DPM   END OF SESSION:   PT End of Session - 04/11/22 1703     Visit Number 8    Number of Visits 14    Date for PT Re-Evaluation 05/26/22    Authorization Type [employer] MCD; 27 VL    PT Start Time 1703    PT Stop Time 4098    PT Time Calculation (min) 40 min    Activity Tolerance Patient tolerated treatment well    Behavior During Therapy WFL for tasks assessed/performed                Past Medical History:  Diagnosis Date   Anemia    history of blood transfusion-no abnormal reaction noted   Asthma    Albuterol as needed   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+, PR+.Takes Tamoxifen daily   Diabetes mellitus without complication ([place])    takes Metformin daily   History of migraine    several yrs ago   Hypertension    takes Tribenzor daily   Past Surgical History:  Procedure Laterality Date   BIOPSY BREAST Left 12/05/12   fibroadenoma   BREAST LUMPECTOMY     2014   BREAST SURGERY Right    CESAREAN SECTION  01/05/2009   CHOLECYSTECTOMY N/A 01/31/2016   Procedure: LAPAROSCOPIC CHOLECYSTECTOMY;  Surgeon: Erroll Luna, MD;  Location: [place];  Service: General;  Laterality: N/A;   DILITATION & CURRETTAGE/HYSTROSCOPY WITH HYDROTHERMAL ABLATION N/A 08/12/2013   Procedure: DILATATION & CURETTAGE/HYSTEROSCOPY WITH HYDROTHERMAL ABLATION;  Surgeon: Frederico Hamman, MD;  Location: [facility];  Service: Gynecology;  Laterality: N/A;   ROBOTIC ASSISTED TOTAL HYSTERECTOMY N/A 06/18/2017   Procedure: XI ROBOTIC ASSISTED TOTAL HYSTERECTOMY;  Surgeon: Lavonia Drafts, MD;  Location: [facility];  Service: Gynecology;  Laterality: N/A;   SALPINGOOPHORECTOMY Bilateral 06/18/2017   Procedure: BILATERAL SALPINGO OOPHORECTOMY;  Surgeon: Lavonia Drafts,  MD;  Location: [facility];  Service: Gynecology;  Laterality: Bilateral;   Patient Active Problem List   Diagnosis Date Noted   Chronic pain of both knees 05/21/2018   Chronic right shoulder pain 05/21/2018   BMI 40.0-44.9, adult ([place]) 05/21/2018   Anemia 06/05/2013   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   Nabothian cyst 12/07/2011   Cervical mass 11/20/2011   Fibroid uterus 11/20/2011    REFERRING DIAG:  M72.2 (ICD-10-CM) - Plantar fasciitis, bilateral  M76.72 (ICD-10-CM) - Peroneal tendinitis, left    THERAPY DIAG:  Pain in left ankle and joints of left foot  Pain in right ankle and joints of right foot  Muscle weakness (generalized)  Other abnormalities of gait and mobility  Rationale for Evaluation and Treatment Rehabilitation  PERTINENT HISTORY:  Breast cancer Diabetes Hypertension   PRECAUTIONS: none   SUBJECTIVE:  SUBJECTIVE STATEMENT:  Patient reports the aquatic PT was helping, but then she went back to work last week and now her feet are hurting a lot.    PAIN:  Are you having pain? Yes: NPRS scale: 10/10 Pain location: bilateral plantar aspect of feet Pain description: sharp Aggravating factors: walking,standing Relieving factors: rest    OBJECTIVE: (objective measures completed at initial evaluation unless otherwise dated)  DIAGNOSTIC FINDINGS:  Multiple views x-ray of both feet: no fracture, dislocation, swelling or degenerative changes noted, plantar calcaneal spur, and pes planus   PATIENT SURVEYS:  LEFS 20/80 04/11/22: LEFS 39/80   COGNITION: Overall cognitive status: Within functional limits for tasks assessed                         SENSATION: WFL   EDEMA:  No obvious swelling        POSTURE: Pes planus in standing   PALPATION: TTP  bilateral plantar fascia, Lt peroneal tendons    LOWER EXTREMITY ROM:   Active ROM Right eval Left eval 02/15/22: 04/11/22  Hip flexion        Hip extension        Hip abduction        Hip adduction        Hip internal rotation        Hip external rotation        Knee flexion        Knee extension        Ankle dorsiflexion 0 Lacking 5  Lt: 0 Rt: 5  Lt: 3; Rt: 5   Ankle plantarflexion WNL WNL    Ankle inversion WNL WNL    Ankle eversion WNL WNL     (Blank rows = not tested)   LOWER EXTREMITY MMT:   MMT Right eval Left eval 02/15/22 04/11/22  Hip flexion 5 5    Hip extension        Hip abduction 3+ 3+  4-/5 bilateral  Hip adduction        Hip internal rotation        Hip external rotation        Knee flexion        Knee extension        Ankle dorsiflexion 5 5    Ankle plantarflexion DL partial calf raise DL partial calf raise  DL partial calf raise Able to complete DL calf raise   Ankle inversion 4 4  Lt: 5; Rt: 4  Ankle eversion 4 4  Lt: 4; Lt: 5    (Blank rows = not tested) *Pain with all MMT bilaterally*   LOWER EXTREMITY SPECIAL TESTS:  Windlass (+)    FUNCTIONAL TESTS:  SLS 1 second bilateral  04/11/22: 1 seconds RLE; 2 seconds LLE   GAIT: Distance walked: 10 ft  Assistive device utilized: None Level of assistance: Complete Independence Comments: maintain supination during stance, foot flat initial contact, limited push-off, WBOS, limited hip/knee flexion during gait cycle.     [facility] Adult PT Treatment:                                                DATE: 04/11/22 Therapeutic Exercise: NuStep level 6 LE only x 5 minutes  Toe raises 2 x 10  Calf raises 2 x 10  Hooklying resisted hip abduction  black band 2 x 10  Updated HEP  Neuromuscular re-ed: Tandem balance 3 x 30 sec each  Therapeutic Activity: Re-assessment to determine overall progress, educating patient on progress towards goals.    [facility] Adult PT Treatment:                                                 DATE: 03/08/2022 Aquatic therapy at [address] - therapeutic pool temp 92 degrees Pt enters building ambulating independently  Treatment took place in water 3.8 to  4 ft 8 in.feet deep depending upon activity.  Pt entered and exited the pool via stair and handrails independently  Patient entered water for aquatic therapy for first time and was introduced to principles and therapeutic effects of water as they ambulated and acclimated to pool.  Therapeutic Exercise: Walking forward/backwards/side stepping x 2 laps each Lateral cross over walking Hip abd with rainbow dumbells Runners stretch on bottom step x30" BIL Calf stretch edge of bottom step 30''x3 Walking march Walking march with PF in SLS Single leg heel raise 2x20 BIL Water bell alternating shoulder flexion/ext and bil Holding dumbell with hip ext/flex with ankle resistance SLS with rotations - yellow dumbells Step up on first step with rails 2x10 fwd x10 lat Hip abd/add x20 BIL  Pt requires the buoyancy of water for active assisted exercises with buoyancy supported for strengthening and AROM exercises. Hydrostatic pressure also supports joints by unweighting joint load by at least 50 % in 3-4 feet depth water. 80% in chest to neck deep water. Water will provide assistance with movement using the current and laminar flow while the buoyancy reduces weight bearing. Pt requires the viscosity of the water for resistance with strengthening exercises.  [facility] Adult PT Treatment:                                                DATE: 02/15/22 Therapeutic Exercise: NuStep level 6 x 5 minutes  Calf stretch on wedge x 60 seconds each gastroc/soleus  Ankle rockerboard 2 x 10; A/P and lateral  Sidelying hip abduction 2 x 10  Hip bridge 2 x 10  SLR 2 x 10  Updated HEP          PATIENT EDUCATION:  Education details: see treatment Person educated: Patient Education method: Explanation, Demonstration, Tactile cues,  Verbal cues, and Handouts Education comprehension: verbalized understanding, returned demonstration, verbal cues required, tactile cues required, and needs further education   HOME EXERCISE PROGRAM: Access Code: [email or web address] URL: [email or web address] Date: 01/18/2022 Prepared by: Gwendolyn Grant   Exercises - Seated Calf Stretch with Strap  - 2 x daily - 7 x weekly - 3 sets - 30 sec  hold - Seated Great Toe Extension  - 2 x daily - 7 x weekly - 2 sets - 10 reps - Arch Lifting  - 2 x daily - 7 x weekly - 2 sets - 10 reps   ASSESSMENT:   CLINICAL IMPRESSION:  Patient returns to PT after 1 month lapse in care reporting that she was noting an improvement in her pain with aquatic therapy, but since she has been out of PT her pain has worsened. She demonstrates improved ankle strength and ROM, but  continues to have limited ankle DF and weakness about the Rt invertors and Lt evertors as well as hip weakness and balance and gait impairments. She will benefit from consistent skilled PT 1 x week for 6 weeks to address the above lingering deficits in order to optimize her function.         OBJECTIVE IMPAIRMENTS: Abnormal gait, decreased activity tolerance, decreased balance, decreased knowledge of condition, difficulty walking, decreased ROM, decreased strength, increased fascial restrictions, improper body mechanics, postural dysfunction, and pain.    ACTIVITY LIMITATIONS: carrying, lifting, bending, standing, squatting, stairs, transfers, and locomotion level   PARTICIPATION LIMITATIONS: meal prep, cleaning, laundry, shopping, community activity, and occupation   PERSONAL FACTORS: Age, Fitness, Profession, Time since onset of injury/illness/exacerbation, and 3+ comorbidities: see PMH above  are also affecting patient's functional outcome.    REHAB POTENTIAL: Fair chronicity of injury   CLINICAL DECISION MAKING: Stable/uncomplicated   EVALUATION COMPLEXITY: Low     GOALS: Goals  reviewed with patient? Yes   SHORT TERM GOALS: Target date: 02/15/22 Patient will be independent and compliant with initial HEP.    Baseline: issued at eval  Goal status: met   2.  Patient will improve bilateral ankle DF AROM by at least 5 degrees to improve gait mechanics.  Baseline: see above  Goal status: met   3.  Patient will be able to perform full range DL calf raise to improve push-off during gait cycle.  Baseline: see above  Goal status: met      LONG TERM GOALS: Target date: 03/15/22   Patient will maintain SLS for at least 5 seconds to improve gait stability.  Baseline: see above  Goal status: ongoing    2.  Patient will demonstrate 5/5 bilateral ankle strength to improve stability when navigating on uneven terrain.  Baseline: see above  Goal status: partially met    3.  Patient will demonstrate at least 4+/5 bilateral hip abductor strength to improve stability about the chain with prolonged walking/standing activity.  Baseline: see above  Goal status: ongoing    4.  Patient will score at least 40/80 on the LEFS to signify clinically meaningful improvement in functional abilities.  Baseline: see above  Goal status: nearly met   5.  Patient will report pain at worst rated as </= 5/10 to reduce her current functional limitations.  Baseline: see above  Goal status: ongoing        PLAN:   PT FREQUENCY: 1x/week   PT DURATION: 6 weeks   PLANNED INTERVENTIONS: Therapeutic exercises, Therapeutic activity, Neuromuscular re-education, Balance training, Gait training, Patient/Family education, Self Care, Joint mobilization, Aquatic Therapy, Dry Needling, Cryotherapy, Moist heat, Manual therapy, and Re-evaluation   PLAN FOR NEXT SESSION: review and progress HEP, foot intrinsics, ankle/hip strengthening, calf stretching, consider TPDN.   Crissie Figures Kambrea Carrasco PT 04/12/22 8:49 AM Phone: [phone] Fax: [phone]

## 2022-04-17 ENCOUNTER — Ambulatory Visit: Payer: Medicaid Other | Admitting: Physical Therapy

## 2022-04-17 ENCOUNTER — Encounter: Payer: Self-pay | Admitting: Physical Therapy

## 2022-04-17 DIAGNOSIS — M25572 Pain in left ankle and joints of left foot: Secondary | ICD-10-CM

## 2022-04-17 DIAGNOSIS — M6281 Muscle weakness (generalized): Secondary | ICD-10-CM

## 2022-04-17 DIAGNOSIS — R2689 Other abnormalities of gait and mobility: Secondary | ICD-10-CM

## 2022-04-17 DIAGNOSIS — M25571 Pain in right ankle and joints of right foot: Secondary | ICD-10-CM

## 2022-04-17 NOTE — Therapy (Signed)
OUTPATIENT PHYSICAL THERAPY TREATMENT NOTE/RE-CERTIFICATION   Patient Name: Sheryl Cox MRN: [id number] DOB:[birth year], 52 y.o., female Today's Date: 04/17/2022  PCP: Lucianne Lei, MD  REFERRING PROVIDER: Criselda Peaches, DPM   END OF SESSION:   PT End of Session - 04/17/22 1658     Visit Number 9    Number of Visits 14    Date for PT Re-Evaluation 05/26/22    Authorization Type [employer] MCD; 27 VL    PT Start Time 1700    PT Stop Time 1739    PT Time Calculation (min) 39 min    Activity Tolerance Patient tolerated treatment well    Behavior During Therapy WFL for tasks assessed/performed                Past Medical History:  Diagnosis Date   Anemia    history of blood transfusion-no abnormal reaction noted   Asthma    Albuterol as needed   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+, PR+.Takes Tamoxifen daily   Diabetes mellitus without complication ([place])    takes Metformin daily   History of migraine    several yrs ago   Hypertension    takes Tribenzor daily   Past Surgical History:  Procedure Laterality Date   BIOPSY BREAST Left 12/05/12   fibroadenoma   BREAST LUMPECTOMY     2014   BREAST SURGERY Right    CESAREAN SECTION  01/05/2009   CHOLECYSTECTOMY N/A 01/31/2016   Procedure: LAPAROSCOPIC CHOLECYSTECTOMY;  Surgeon: Erroll Luna, MD;  Location: [facility];  Service: General;  Laterality: N/A;   DILITATION & CURRETTAGE/HYSTROSCOPY WITH HYDROTHERMAL ABLATION N/A 08/12/2013   Procedure: DILATATION & CURETTAGE/HYSTEROSCOPY WITH HYDROTHERMAL ABLATION;  Surgeon: Frederico Hamman, MD;  Location: [facility];  Service: Gynecology;  Laterality: N/A;   ROBOTIC ASSISTED TOTAL HYSTERECTOMY N/A 06/18/2017   Procedure: XI ROBOTIC ASSISTED TOTAL HYSTERECTOMY;  Surgeon: Lavonia Drafts, MD;  Location: [facility];  Service: Gynecology;  Laterality: N/A;   SALPINGOOPHORECTOMY Bilateral 06/18/2017   Procedure: BILATERAL SALPINGO OOPHORECTOMY;  Surgeon: Lavonia Drafts,  MD;  Location: [facility];  Service: Gynecology;  Laterality: Bilateral;   Patient Active Problem List   Diagnosis Date Noted   Chronic pain of both knees 05/21/2018   Chronic right shoulder pain 05/21/2018   BMI 40.0-44.9, adult ([place]) 05/21/2018   Anemia 06/05/2013   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   Nabothian cyst 12/07/2011   Cervical mass 11/20/2011   Fibroid uterus 11/20/2011    REFERRING DIAG:  M72.2 (ICD-10-CM) - Plantar fasciitis, bilateral  M76.72 (ICD-10-CM) - Peroneal tendinitis, left    THERAPY DIAG:  Pain in left ankle and joints of left foot  Pain in right ankle and joints of right foot  Muscle weakness (generalized)  Other abnormalities of gait and mobility  Rationale for Evaluation and Treatment Rehabilitation  PERTINENT HISTORY:  Breast cancer Diabetes Hypertension   PRECAUTIONS: none   SUBJECTIVE:  SUBJECTIVE STATEMENT:  Patient reports the aquatic PT was helping, but then she went back to work last week and now her feet are hurting a lot.    PAIN:  Are you having pain? Yes: NPRS scale: 10/10 Pain location: bilateral plantar aspect of feet Pain description: sharp Aggravating factors: walking,standing Relieving factors: rest    OBJECTIVE: (objective measures completed at initial evaluation unless otherwise dated)  DIAGNOSTIC FINDINGS:  Multiple views x-ray of both feet: no fracture, dislocation, swelling or degenerative changes noted, plantar calcaneal spur, and pes planus   PATIENT SURVEYS:  LEFS 20/80 04/11/22: LEFS 39/80   COGNITION: Overall cognitive status: Within functional limits for tasks assessed                         SENSATION: WFL   EDEMA:  No obvious swelling        POSTURE: Pes planus in standing   PALPATION: TTP  bilateral plantar fascia, Lt peroneal tendons    LOWER EXTREMITY ROM:   Active ROM Right eval Left eval 02/15/22: 04/11/22  Hip flexion        Hip extension        Hip abduction        Hip adduction        Hip internal rotation        Hip external rotation        Knee flexion        Knee extension        Ankle dorsiflexion 0 Lacking 5  Lt: 0 Rt: 5  Lt: 3; Rt: 5   Ankle plantarflexion WNL WNL    Ankle inversion WNL WNL    Ankle eversion WNL WNL     (Blank rows = not tested)   LOWER EXTREMITY MMT:   MMT Right eval Left eval 02/15/22 04/11/22  Hip flexion 5 5    Hip extension        Hip abduction 3+ 3+  4-/5 bilateral  Hip adduction        Hip internal rotation        Hip external rotation        Knee flexion        Knee extension        Ankle dorsiflexion 5 5    Ankle plantarflexion DL partial calf raise DL partial calf raise  DL partial calf raise Able to complete DL calf raise   Ankle inversion 4 4  Lt: 5; Rt: 4  Ankle eversion 4 4  Lt: 4; Lt: 5    (Blank rows = not tested) *Pain with all MMT bilaterally*   LOWER EXTREMITY SPECIAL TESTS:  Windlass (+)    FUNCTIONAL TESTS:  SLS 1 second bilateral  04/11/22: 1 seconds RLE; 2 seconds LLE   GAIT: Distance walked: 10 ft  Assistive device utilized: None Level of assistance: Complete Independence Comments: maintain supination during stance, foot flat initial contact, limited push-off, WBOS, limited hip/knee flexion during gait cycle.     [facility] Adult PT Treatment:                                                DATE: 04/17/22 Therapeutic Exercise: NuStep level 6 LE only x 5 minutes  Toe raises 2 x 10 - on airex Calf raises 2 x 10 - on airex  Hooklying resisted hip abduction black band 2 x 10  Towel scrunch bil  Neuromuscular re-ed: Tandem balance - 30'' bouts Tandem on foam - 30'' bouts Blue rocker board - DF/PF - 1  Manual Therapy - STM plantar surface bil feet   [facility] Adult PT Treatment:                                                 DATE: 03/08/2022 Aquatic therapy at [address] - therapeutic pool temp 92 degrees Pt enters building ambulating independently  Treatment took place in water 3.8 to  4 ft 8 in.feet deep depending upon activity.  Pt entered and exited the pool via stair and handrails independently  Patient entered water for aquatic therapy for first time and was introduced to principles and therapeutic effects of water as they ambulated and acclimated to pool.  Therapeutic Exercise: Walking forward/backwards/side stepping x 2 laps each Lateral cross over walking Hip abd with rainbow dumbells Runners stretch on bottom step x30" BIL Calf stretch edge of bottom step 30''x3 Walking march Walking march with PF in SLS Single leg heel raise 2x20 BIL Water bell alternating shoulder flexion/ext and bil Holding dumbell with hip ext/flex with ankle resistance SLS with rotations - yellow dumbells Step up on first step with rails 2x10 fwd x10 lat Hip abd/add x20 BIL  Pt requires the buoyancy of water for active assisted exercises with buoyancy supported for strengthening and AROM exercises. Hydrostatic pressure also supports joints by unweighting joint load by at least 50 % in 3-4 feet depth water. 80% in chest to neck deep water. Water will provide assistance with movement using the current and laminar flow while the buoyancy reduces weight bearing. Pt requires the viscosity of the water for resistance with strengthening exercises.  [facility] Adult PT Treatment:                                                DATE: 02/15/22 Therapeutic Exercise: NuStep level 6 x 5 minutes  Calf stretch on wedge x 60 seconds each gastroc/soleus  Ankle rockerboard 2 x 10; A/P and lateral  Sidelying hip abduction 2 x 10  Hip bridge 2 x 10  SLR 2 x 10  Updated HEP          PATIENT EDUCATION:  Education details: see treatment Person educated: Patient Education method: Explanation,  Demonstration, Tactile cues, Verbal cues, and Handouts Education comprehension: verbalized understanding, returned demonstration, verbal cues required, tactile cues required, and needs further education   HOME EXERCISE PROGRAM: Access Code: [email or web address] URL: [email or web address] Date: 01/18/2022 Prepared by: Gwendolyn Grant   Exercises - Seated Calf Stretch with Strap  - 2 x daily - 7 x weekly - 3 sets - 30 sec  hold - Seated Great Toe Extension  - 2 x daily - 7 x weekly - 2 sets - 10 reps - Arch Lifting  - 2 x daily - 7 x weekly - 2 sets - 10 reps   ASSESSMENT:   CLINICAL IMPRESSION:  Jaydalynn tolerated session well with no adverse reaction.  She is limited in higher level standing exercises d/t plantar pain in bil feet.  She tolerates activities on airex better than on hard  surfaces.  Foot intrinsics tire quickly with towel scrunch.  Pt reports roughly 50% reduction in pain following manual.         OBJECTIVE IMPAIRMENTS: Abnormal gait, decreased activity tolerance, decreased balance, decreased knowledge of condition, difficulty walking, decreased ROM, decreased strength, increased fascial restrictions, improper body mechanics, postural dysfunction, and pain.    ACTIVITY LIMITATIONS: carrying, lifting, bending, standing, squatting, stairs, transfers, and locomotion level   PARTICIPATION LIMITATIONS: meal prep, cleaning, laundry, shopping, community activity, and occupation   PERSONAL FACTORS: Age, Fitness, Profession, Time since onset of injury/illness/exacerbation, and 3+ comorbidities: see PMH above  are also affecting patient's functional outcome.    REHAB POTENTIAL: Fair chronicity of injury   CLINICAL DECISION MAKING: Stable/uncomplicated   EVALUATION COMPLEXITY: Low     GOALS: Goals reviewed with patient? Yes   SHORT TERM GOALS: Target date: 02/15/22 Patient will be independent and compliant with initial HEP.    Baseline: issued at eval  Goal status: met    2.  Patient will improve bilateral ankle DF AROM by at least 5 degrees to improve gait mechanics.  Baseline: see above  Goal status: met   3.  Patient will be able to perform full range DL calf raise to improve push-off during gait cycle.  Baseline: see above  Goal status: met      LONG TERM GOALS: Target date: 03/15/22   Patient will maintain SLS for at least 5 seconds to improve gait stability.  Baseline: see above  Goal status: ongoing    2.  Patient will demonstrate 5/5 bilateral ankle strength to improve stability when navigating on uneven terrain.  Baseline: see above  Goal status: partially met    3.  Patient will demonstrate at least 4+/5 bilateral hip abductor strength to improve stability about the chain with prolonged walking/standing activity.  Baseline: see above  Goal status: ongoing    4.  Patient will score at least 40/80 on the LEFS to signify clinically meaningful improvement in functional abilities.  Baseline: see above  Goal status: nearly met   5.  Patient will report pain at worst rated as </= 5/10 to reduce her current functional limitations.  Baseline: see above  Goal status: ongoing        PLAN:   PT FREQUENCY: 1x/week   PT DURATION: 6 weeks   PLANNED INTERVENTIONS: Therapeutic exercises, Therapeutic activity, Neuromuscular re-education, Balance training, Gait training, Patient/Family education, Self Care, Joint mobilization, Aquatic Therapy, Dry Needling, Cryotherapy, Moist heat, Manual therapy, and Re-evaluation   PLAN FOR NEXT SESSION: review and progress HEP, foot intrinsics, ankle/hip strengthening, calf stretching, consider TPDN.   Kevan Ny Tunya Held PT 04/17/22 5:42 PM Phone: [phone] Fax: [phone]

## 2022-04-19 ENCOUNTER — Other Ambulatory Visit: Payer: Self-pay | Admitting: Hematology and Oncology

## 2022-04-19 ENCOUNTER — Other Ambulatory Visit: Payer: Self-pay | Admitting: Podiatry

## 2022-04-24 ENCOUNTER — Ambulatory Visit: Payer: Medicaid Other | Admitting: Physical Therapy

## 2022-05-01 ENCOUNTER — Telehealth: Payer: Self-pay | Admitting: Physical Therapy

## 2022-05-01 ENCOUNTER — Ambulatory Visit: Payer: Medicaid Other | Attending: Podiatry | Admitting: Physical Therapy

## 2022-05-01 DIAGNOSIS — M6281 Muscle weakness (generalized): Secondary | ICD-10-CM | POA: Insufficient documentation

## 2022-05-01 DIAGNOSIS — M25572 Pain in left ankle and joints of left foot: Secondary | ICD-10-CM | POA: Insufficient documentation

## 2022-05-01 DIAGNOSIS — R2689 Other abnormalities of gait and mobility: Secondary | ICD-10-CM | POA: Insufficient documentation

## 2022-05-01 DIAGNOSIS — M25571 Pain in right ankle and joints of right foot: Secondary | ICD-10-CM | POA: Insufficient documentation

## 2022-05-01 NOTE — Telephone Encounter (Signed)
Called and informed patient of missed visit and provided reminder of next appt and attendance policy. (2nd N/S or late cancel recently, will have to cancel remaining appts if another appt is missed)

## 2022-05-03 ENCOUNTER — Ambulatory Visit: Payer: Medicaid Other | Admitting: Physical Therapy

## 2022-05-03 ENCOUNTER — Encounter: Payer: Self-pay | Admitting: Physical Therapy

## 2022-05-03 DIAGNOSIS — M6281 Muscle weakness (generalized): Secondary | ICD-10-CM | POA: Diagnosis present

## 2022-05-03 DIAGNOSIS — R2689 Other abnormalities of gait and mobility: Secondary | ICD-10-CM

## 2022-05-03 DIAGNOSIS — M25572 Pain in left ankle and joints of left foot: Secondary | ICD-10-CM | POA: Diagnosis present

## 2022-05-03 DIAGNOSIS — M25571 Pain in right ankle and joints of right foot: Secondary | ICD-10-CM

## 2022-05-03 NOTE — Therapy (Signed)
OUTPATIENT PHYSICAL THERAPY TREATMENT NOTE/RE-CERTIFICATION   Patient Name: Sheryl Cox MRN: [id number] DOB:06/10/1970, 52 y.o., female Today's Date: 05/04/2022  PCP: Lucianne Lei, MD  REFERRING PROVIDER: Criselda Peaches, DPM   END OF SESSION:   PT End of Session - 05/03/22 1536     Visit Number 10    Number of Visits 14    Date for PT Re-Evaluation 05/26/22    Authorization Type [employer] MCD; 27 VL    PT Start Time 0340    PT Stop Time 0422    PT Time Calculation (min) 42 min    Activity Tolerance Patient tolerated treatment well    Behavior During Therapy WFL for tasks assessed/performed                Past Medical History:  Diagnosis Date   Anemia    history of blood transfusion-no abnormal reaction noted   Asthma    Albuterol as needed   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+, PR+.Takes Tamoxifen daily   Diabetes mellitus without complication ([place])    takes Metformin daily   History of migraine    several yrs ago   Hypertension    takes Tribenzor daily   Past Surgical History:  Procedure Laterality Date   BIOPSY BREAST Left 12/05/12   fibroadenoma   BREAST LUMPECTOMY     2014   BREAST SURGERY Right    CESAREAN SECTION  01/05/2009   CHOLECYSTECTOMY N/A 01/31/2016   Procedure: LAPAROSCOPIC CHOLECYSTECTOMY;  Surgeon: Erroll Luna, MD;  Location: [facility];  Service: General;  Laterality: N/A;   DILITATION & CURRETTAGE/HYSTROSCOPY WITH HYDROTHERMAL ABLATION N/A 08/12/2013   Procedure: DILATATION & CURETTAGE/HYSTEROSCOPY WITH HYDROTHERMAL ABLATION;  Surgeon: Frederico Hamman, MD;  Location: [facility];  Service: Gynecology;  Laterality: N/A;   ROBOTIC ASSISTED TOTAL HYSTERECTOMY N/A 06/18/2017   Procedure: XI ROBOTIC ASSISTED TOTAL HYSTERECTOMY;  Surgeon: Lavonia Drafts, MD;  Location: [facility];  Service: Gynecology;  Laterality: N/A;   SALPINGOOPHORECTOMY Bilateral 06/18/2017   Procedure: BILATERAL SALPINGO OOPHORECTOMY;  Surgeon: Lavonia Drafts,  MD;  Location: [facility];  Service: Gynecology;  Laterality: Bilateral;   Patient Active Problem List   Diagnosis Date Noted   Chronic pain of both knees 05/21/2018   Chronic right shoulder pain 05/21/2018   BMI 40.0-44.9, adult ([place]) 05/21/2018   Anemia 06/05/2013   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   Nabothian cyst 12/07/2011   Cervical mass 11/20/2011   Fibroid uterus 11/20/2011    REFERRING DIAG:  M72.2 (ICD-10-CM) - Plantar fasciitis, bilateral  M76.72 (ICD-10-CM) - Peroneal tendinitis, left    THERAPY DIAG:  Pain in left ankle and joints of left foot  Pain in right ankle and joints of right foot  Muscle weakness (generalized)  Other abnormalities of gait and mobility  Rationale for Evaluation and Treatment Rehabilitation  PERTINENT HISTORY:  Breast cancer Diabetes Hypertension   PRECAUTIONS: none   SUBJECTIVE:  SUBJECTIVE STATEMENT:   Pt reports that her feet continue to hurt, particularly after work.  She reports that PT has been helpful in the amount she can do before onset of pain.   PAIN:  Are you having pain? Yes: NPRS scale: 10/10 Pain location: bilateral plantar aspect of feet Pain description: sharp Aggravating factors: walking,standing Relieving factors: rest    OBJECTIVE: (objective measures completed at initial evaluation unless otherwise dated)  DIAGNOSTIC FINDINGS:  Multiple views x-ray of both feet: no fracture, dislocation, swelling or degenerative changes noted, plantar calcaneal spur, and pes planus   PATIENT SURVEYS:  LEFS 20/80 04/11/22: LEFS 39/80   COGNITION: Overall cognitive status: Within functional limits for tasks assessed                         SENSATION: WFL   EDEMA:  No obvious swelling        POSTURE: Pes planus  in standing   PALPATION: TTP bilateral plantar fascia, Lt peroneal tendons    LOWER EXTREMITY ROM:   Active ROM Right eval Left eval 02/15/22: 04/11/22  Hip flexion        Hip extension        Hip abduction        Hip adduction        Hip internal rotation        Hip external rotation        Knee flexion        Knee extension        Ankle dorsiflexion 0 Lacking 5  Lt: 0 Rt: 5  Lt: 3; Rt: 5   Ankle plantarflexion WNL WNL    Ankle inversion WNL WNL    Ankle eversion WNL WNL     (Blank rows = not tested)   LOWER EXTREMITY MMT:   MMT Right eval Left eval 02/15/22 04/11/22  Hip flexion 5 5    Hip extension        Hip abduction 3+ 3+  4-/5 bilateral  Hip adduction        Hip internal rotation        Hip external rotation        Knee flexion        Knee extension        Ankle dorsiflexion 5 5    Ankle plantarflexion DL partial calf raise DL partial calf raise  DL partial calf raise Able to complete DL calf raise   Ankle inversion 4 4  Lt: 5; Rt: 4  Ankle eversion 4 4  Lt: 4; Lt: 5    (Blank rows = not tested) *Pain with all MMT bilaterally*   LOWER EXTREMITY SPECIAL TESTS:  Windlass (+)    FUNCTIONAL TESTS:  SLS 1 second bilateral  04/11/22: 1 seconds RLE; 2 seconds LLE   GAIT: Distance walked: 10 ft  Assistive device utilized: None Level of assistance: Complete Independence Comments: maintain supination during stance, foot flat initial contact, limited push-off, WBOS, limited hip/knee flexion during gait cycle.   [facility] Adult PT Treatment:                                                DATE: 05/03/2021 Aquatic therapy at [address] - therapeutic pool temp 92 degrees Pt enters building ambulating independently  Treatment took place in water  3.8 to  4 ft 8 in.feet deep depending upon activity.  Pt entered and exited the pool via stair and handrails independently  Patient entered water for aquatic therapy for first time and was introduced to principles  and therapeutic effects of water as they ambulated and acclimated to pool.   Therapeutic Exercise: Walking forward/backwards/side stepping x 2 laps each Lateral cross over walking Walking march - 2 laps Tandem walking - 2 laps Walking march with plantar flexion - 2 laps Yellow pool noodle stomp Tandem stance with water bell shoulder ext bil and alternating Alternating hip abd - 2x20 ea Squats - 3x10 Calf stretch on wall of pool Sit to stand - [id number]   Pt requires the buoyancy of water for active assisted exercises with buoyancy supported for strengthening and AROM exercises. Hydrostatic pressure also supports joints by unweighting joint load by at least 50 % in 3-4 feet depth water. 80% in chest to neck deep water. Water will provide assistance with movement using the current and laminar flow while the buoyancy reduces weight bearing. Pt requires the viscosity of the water for resistance with strengthening exercises.  [facility] Adult PT Treatment:                                                DATE: 04/17/22 Therapeutic Exercise: NuStep level 6 LE only x 5 minutes  Toe raises 2 x 10 - on airex Calf raises 2 x 10 - on airex Hooklying resisted hip abduction black band 2 x 10  Towel scrunch bil  Neuromuscular re-ed: Tandem balance - 30'' bouts Tandem on foam - 30'' bouts Blue rocker board - DF/PF - 1  Manual Therapy - STM plantar surface bil feet   [facility] Adult PT Treatment:                                                DATE: 03/08/2022 Aquatic therapy at [address] - therapeutic pool temp 92 degrees Pt enters building ambulating independently  Treatment took place in water 3.8 to  4 ft 8 in.feet deep depending upon activity.  Pt entered and exited the pool via stair and handrails independently  Patient entered water for aquatic therapy for first time and was introduced to principles and therapeutic effects of water as they ambulated and acclimated to  pool.  Therapeutic Exercise: Walking forward/backwards/side stepping x 2 laps each Lateral cross over walking Hip abd with rainbow dumbells Runners stretch on bottom step x30" BIL Calf stretch edge of bottom step 30''x3 Walking march Walking march with PF in SLS Single leg heel raise 2x20 BIL Water bell alternating shoulder flexion/ext and bil Holding dumbell with hip ext/flex with ankle resistance SLS with rotations - yellow dumbells Step up on first step with rails 2x10 fwd x10 lat Hip abd/add x20 BIL  Pt requires the buoyancy of water for active assisted exercises with buoyancy supported for strengthening and AROM exercises. Hydrostatic pressure also supports joints by unweighting joint load by at least 50 % in 3-4 feet depth water. 80% in chest to neck deep water. Water will provide assistance with movement using the current and laminar flow while the buoyancy reduces weight bearing. Pt requires the viscosity of the  water for resistance with strengthening exercises.  [facility] Adult PT Treatment:                                                DATE: 02/15/22 Therapeutic Exercise: NuStep level 6 x 5 minutes  Calf stretch on wedge x 60 seconds each gastroc/soleus  Ankle rockerboard 2 x 10; A/P and lateral  Sidelying hip abduction 2 x 10  Hip bridge 2 x 10  SLR 2 x 10  Updated HEP          PATIENT EDUCATION:  Education details: see treatment Person educated: Patient Education method: Explanation, Demonstration, Tactile cues, Verbal cues, and Handouts Education comprehension: verbalized understanding, returned demonstration, verbal cues required, tactile cues required, and needs further education   HOME EXERCISE PROGRAM: Access Code: [email or web address] URL: [email or web address] Date: 01/18/2022 Prepared by: Gwendolyn Grant   Exercises - Seated Calf Stretch with Strap  - 2 x daily - 7 x weekly - 3 sets - 30 sec  hold - Seated Great Toe Extension  - 2 x daily - 7 x weekly - 2  sets - 10 reps - Arch Lifting  - 2 x daily - 7 x weekly - 2 sets - 10 reps   ASSESSMENT:   CLINICAL IMPRESSION: Session today focused on improving comfort with SLS as well as general LE strengthening/endurance in the aquatic environment for use of buoyancy to offload joints and the viscosity of water as resistance during therapeutic exercise.  Pt with clear balance deficit with narrow base of support, particularly while walking.  Pt fatigues with standing exercises but is able to complete with good form with cuing.  Patient was able to tolerate all prescribed exercises in the aquatic environment with no adverse effects and reports 7/10 pain at the end of the session. Patient continues to benefit from skilled PT services on land and aquatic based and should be progressed as able to improve functional independence.      OBJECTIVE IMPAIRMENTS: Abnormal gait, decreased activity tolerance, decreased balance, decreased knowledge of condition, difficulty walking, decreased ROM, decreased strength, increased fascial restrictions, improper body mechanics, postural dysfunction, and pain.    ACTIVITY LIMITATIONS: carrying, lifting, bending, standing, squatting, stairs, transfers, and locomotion level   PARTICIPATION LIMITATIONS: meal prep, cleaning, laundry, shopping, community activity, and occupation   PERSONAL FACTORS: Age, Fitness, Profession, Time since onset of injury/illness/exacerbation, and 3+ comorbidities: see PMH above  are also affecting patient's functional outcome.    REHAB POTENTIAL: Fair chronicity of injury   CLINICAL DECISION MAKING: Stable/uncomplicated   EVALUATION COMPLEXITY: Low     GOALS: Goals reviewed with patient? Yes   SHORT TERM GOALS: Target date: 02/15/22 Patient will be independent and compliant with initial HEP.    Baseline: issued at eval  Goal status: met   2.  Patient will improve bilateral ankle DF AROM by at least 5 degrees to improve gait mechanics.   Baseline: see above  Goal status: met   3.  Patient will be able to perform full range DL calf raise to improve push-off during gait cycle.  Baseline: see above  Goal status: met      LONG TERM GOALS: Target date: 03/15/22   Patient will maintain SLS for at least 5 seconds to improve gait stability.  Baseline: see above  Goal status: ongoing    2.  Patient will  demonstrate 5/5 bilateral ankle strength to improve stability when navigating on uneven terrain.  Baseline: see above  Goal status: partially met    3.  Patient will demonstrate at least 4+/5 bilateral hip abductor strength to improve stability about the chain with prolonged walking/standing activity.  Baseline: see above  Goal status: ongoing    4.  Patient will score at least 40/80 on the LEFS to signify clinically meaningful improvement in functional abilities.  Baseline: see above  Goal status: nearly met   5.  Patient will report pain at worst rated as </= 5/10 to reduce her current functional limitations.  Baseline: see above  Goal status: ongoing        PLAN:   PT FREQUENCY: 1x/week   PT DURATION: 6 weeks   PLANNED INTERVENTIONS: Therapeutic exercises, Therapeutic activity, Neuromuscular re-education, Balance training, Gait training, Patient/Family education, Self Care, Joint mobilization, Aquatic Therapy, Dry Needling, Cryotherapy, Moist heat, Manual therapy, and Re-evaluation   PLAN FOR NEXT SESSION: review and progress HEP, foot intrinsics, ankle/hip strengthening, calf stretching, consider TPDN.   Kevan Ny Florentino Laabs PT 05/04/22 6:59 AM Phone: [phone] Fax: [phone]

## 2022-05-08 ENCOUNTER — Ambulatory Visit: Payer: Medicaid Other | Admitting: Physical Therapy

## 2022-05-10 ENCOUNTER — Ambulatory Visit: Payer: Medicaid Other | Admitting: Physical Therapy

## 2022-05-10 NOTE — Therapy (Deleted)
OUTPATIENT PHYSICAL THERAPY TREATMENT NOTE/RE-CERTIFICATION   Patient Name: Sheryl Cox MRN: [id number] DOB:[birth year], 52 y.o., female Today's Date: 05/10/2022  PCP: Lucianne Lei, MD  REFERRING PROVIDER: Criselda Peaches, DPM   END OF SESSION:        Past Medical History:  Diagnosis Date   Anemia    history of blood transfusion-no abnormal reaction noted   Asthma    Albuterol as needed   Breast cancer ([place]) 11/2012   DCIS-ER+, PR+.Takes Tamoxifen daily   Diabetes mellitus without complication ([place])    takes Metformin daily   History of migraine    several yrs ago   Hypertension    takes Tribenzor daily   Past Surgical History:  Procedure Laterality Date   BIOPSY BREAST Left 12/05/12   fibroadenoma   BREAST LUMPECTOMY     2014   BREAST SURGERY Right    CESAREAN SECTION  01/05/2009   CHOLECYSTECTOMY N/A 01/31/2016   Procedure: LAPAROSCOPIC CHOLECYSTECTOMY;  Surgeon: Erroll Luna, MD;  Location: [facility];  Service: General;  Laterality: N/A;   DILITATION & CURRETTAGE/HYSTROSCOPY WITH HYDROTHERMAL ABLATION N/A 08/12/2013   Procedure: DILATATION & CURETTAGE/HYSTEROSCOPY WITH HYDROTHERMAL ABLATION;  Surgeon: Frederico Hamman, MD;  Location: [facility];  Service: Gynecology;  Laterality: N/A;   ROBOTIC ASSISTED TOTAL HYSTERECTOMY N/A 06/18/2017   Procedure: XI ROBOTIC ASSISTED TOTAL HYSTERECTOMY;  Surgeon: Lavonia Drafts, MD;  Location: [facility];  Service: Gynecology;  Laterality: N/A;   SALPINGOOPHORECTOMY Bilateral 06/18/2017   Procedure: BILATERAL SALPINGO OOPHORECTOMY;  Surgeon: Lavonia Drafts, MD;  Location: [facility];  Service: Gynecology;  Laterality: Bilateral;   Patient Active Problem List   Diagnosis Date Noted   Chronic pain of both knees 05/21/2018   Chronic right shoulder pain 05/21/2018   BMI 40.0-44.9, adult ([place]) 05/21/2018   Anemia 06/05/2013   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   Nabothian cyst 12/07/2011   Cervical  mass 11/20/2011   Fibroid uterus 11/20/2011    REFERRING DIAG:  M72.2 (ICD-10-CM) - Plantar fasciitis, bilateral  M76.72 (ICD-10-CM) - Peroneal tendinitis, left    THERAPY DIAG:  No diagnosis found.  Rationale for Evaluation and Treatment Rehabilitation  PERTINENT HISTORY:  Breast cancer Diabetes Hypertension   PRECAUTIONS: none   SUBJECTIVE:                                                                                                                                                                                      SUBJECTIVE STATEMENT:   ***   PAIN:  Are you having pain? Yes: NPRS scale: 10/10 Pain location: bilateral plantar aspect of feet Pain description: sharp Aggravating factors: walking,standing Relieving  factors: rest    OBJECTIVE: (objective measures completed at initial evaluation unless otherwise dated)  DIAGNOSTIC FINDINGS:  Multiple views x-ray of both feet: no fracture, dislocation, swelling or degenerative changes noted, plantar calcaneal spur, and pes planus   PATIENT SURVEYS:  LEFS 20/80 04/11/22: LEFS 39/80   COGNITION: Overall cognitive status: Within functional limits for tasks assessed                         SENSATION: WFL   EDEMA:  No obvious swelling        POSTURE: Pes planus in standing   PALPATION: TTP bilateral plantar fascia, Lt peroneal tendons    LOWER EXTREMITY ROM:   Active ROM Right eval Left eval 02/15/22: 04/11/22  Hip flexion        Hip extension        Hip abduction        Hip adduction        Hip internal rotation        Hip external rotation        Knee flexion        Knee extension        Ankle dorsiflexion 0 Lacking 5  Lt: 0 Rt: 5  Lt: 3; Rt: 5   Ankle plantarflexion WNL WNL    Ankle inversion WNL WNL    Ankle eversion WNL WNL     (Blank rows = not tested)   LOWER EXTREMITY MMT:   MMT Right eval Left eval 02/15/22 04/11/22  Hip flexion 5 5    Hip extension        Hip abduction 3+ 3+   4-/5 bilateral  Hip adduction        Hip internal rotation        Hip external rotation        Knee flexion        Knee extension        Ankle dorsiflexion 5 5    Ankle plantarflexion DL partial calf raise DL partial calf raise  DL partial calf raise Able to complete DL calf raise   Ankle inversion 4 4  Lt: 5; Rt: 4  Ankle eversion 4 4  Lt: 4; Lt: 5    (Blank rows = not tested) *Pain with all MMT bilaterally*   LOWER EXTREMITY SPECIAL TESTS:  Windlass (+)    FUNCTIONAL TESTS:  SLS 1 second bilateral  04/11/22: 1 seconds RLE; 2 seconds LLE   GAIT: Distance walked: 10 ft  Assistive device utilized: None Level of assistance: Complete Independence Comments: maintain supination during stance, foot flat initial contact, limited push-off, WBOS, limited hip/knee flexion during gait cycle.   [facility] Adult PT Treatment:                                                DATE: 05/10/2021 Aquatic therapy at [address] - therapeutic pool temp 92 degrees Pt enters building ambulating independently  Treatment took place in water 3.8 to  4 ft 8 in.feet deep depending upon activity.  Pt entered and exited the pool via stair and handrails independently  Patient entered water for aquatic therapy for first time and was introduced to principles and therapeutic effects of water as they ambulated and acclimated to pool.   Therapeutic Exercise: Walking forward/backwards/side stepping x 2  laps each Lateral cross over walking Walking march - 2 laps Tandem walking - 2 laps Walking march with plantar flexion - 2 laps Yellow pool noodle stomp Tandem stance with water bell shoulder ext bil and alternating Alternating hip abd - 2x20 ea Squats - 3x10 Calf stretch on wall of pool Sit to stand - [id number]   Pt requires the buoyancy of water for active assisted exercises with buoyancy supported for strengthening and AROM exercises. Hydrostatic pressure also supports joints by unweighting joint load  by at least 50 % in 3-4 feet depth water. 80% in chest to neck deep water. Water will provide assistance with movement using the current and laminar flow while the buoyancy reduces weight bearing. Pt requires the viscosity of the water for resistance with strengthening exercises.  [facility] Adult PT Treatment:                                                DATE: 04/17/22 Therapeutic Exercise: NuStep level 6 LE only x 5 minutes  Toe raises 2 x 10 - on airex Calf raises 2 x 10 - on airex Hooklying resisted hip abduction black band 2 x 10  Towel scrunch bil  Neuromuscular re-ed: Tandem balance - 30'' bouts Tandem on foam - 30'' bouts Blue rocker board - DF/PF - 1  Manual Therapy - STM plantar surface bil feet   [facility] Adult PT Treatment:                                                DATE: 03/08/2022 Aquatic therapy at [address] - therapeutic pool temp 92 degrees Pt enters building ambulating independently  Treatment took place in water 3.8 to  4 ft 8 in.feet deep depending upon activity.  Pt entered and exited the pool via stair and handrails independently  Patient entered water for aquatic therapy for first time and was introduced to principles and therapeutic effects of water as they ambulated and acclimated to pool.  Therapeutic Exercise: Walking forward/backwards/side stepping x 2 laps each Lateral cross over walking Hip abd with rainbow dumbells Runners stretch on bottom step x30" BIL Calf stretch edge of bottom step 30''x3 Walking march Walking march with PF in SLS Single leg heel raise 2x20 BIL Water bell alternating shoulder flexion/ext and bil Holding dumbell with hip ext/flex with ankle resistance SLS with rotations - yellow dumbells Step up on first step with rails 2x10 fwd x10 lat Hip abd/add x20 BIL  Pt requires the buoyancy of water for active assisted exercises with buoyancy supported for strengthening and AROM exercises. Hydrostatic pressure also  supports joints by unweighting joint load by at least 50 % in 3-4 feet depth water. 80% in chest to neck deep water. Water will provide assistance with movement using the current and laminar flow while the buoyancy reduces weight bearing. Pt requires the viscosity of the water for resistance with strengthening exercises.  [facility] Adult PT Treatment:                                                DATE:  02/15/22 Therapeutic Exercise: NuStep level 6 x 5 minutes  Calf stretch on wedge x 60 seconds each gastroc/soleus  Ankle rockerboard 2 x 10; A/P and lateral  Sidelying hip abduction 2 x 10  Hip bridge 2 x 10  SLR 2 x 10  Updated HEP          PATIENT EDUCATION:  Education details: see treatment Person educated: Patient Education method: Explanation, Demonstration, Tactile cues, Verbal cues, and Handouts Education comprehension: verbalized understanding, returned demonstration, verbal cues required, tactile cues required, and needs further education   HOME EXERCISE PROGRAM: Access Code: [email or web address] URL: [email or web address] Date: 01/18/2022 Prepared by: Gwendolyn Grant   Exercises - Seated Calf Stretch with Strap  - 2 x daily - 7 x weekly - 3 sets - 30 sec  hold - Seated Great Toe Extension  - 2 x daily - 7 x weekly - 2 sets - 10 reps - Arch Lifting  - 2 x daily - 7 x weekly - 2 sets - 10 reps   ASSESSMENT:   CLINICAL IMPRESSION: Session today focused on improving comfort with SLS as well as general LE strengthening/endurance in the aquatic environment for use of buoyancy to offload joints and the viscosity of water as resistance during therapeutic exercise.  ***.  Patient was able to tolerate all prescribed exercises in the aquatic environment with no adverse effects and reports ***/10 pain at the end of the session. Patient continues to benefit from skilled PT services on land and aquatic based and should be progressed as able to improve functional independence.       OBJECTIVE IMPAIRMENTS: Abnormal gait, decreased activity tolerance, decreased balance, decreased knowledge of condition, difficulty walking, decreased ROM, decreased strength, increased fascial restrictions, improper body mechanics, postural dysfunction, and pain.    ACTIVITY LIMITATIONS: carrying, lifting, bending, standing, squatting, stairs, transfers, and locomotion level   PARTICIPATION LIMITATIONS: meal prep, cleaning, laundry, shopping, community activity, and occupation   PERSONAL FACTORS: Age, Fitness, Profession, Time since onset of injury/illness/exacerbation, and 3+ comorbidities: see PMH above  are also affecting patient's functional outcome.    REHAB POTENTIAL: Fair chronicity of injury   CLINICAL DECISION MAKING: Stable/uncomplicated   EVALUATION COMPLEXITY: Low     GOALS: Goals reviewed with patient? Yes   SHORT TERM GOALS: Target date: 02/15/22 Patient will be independent and compliant with initial HEP.    Baseline: issued at eval  Goal status: met   2.  Patient will improve bilateral ankle DF AROM by at least 5 degrees to improve gait mechanics.  Baseline: see above  Goal status: met   3.  Patient will be able to perform full range DL calf raise to improve push-off during gait cycle.  Baseline: see above  Goal status: met      LONG TERM GOALS: Target date: 03/15/22   Patient will maintain SLS for at least 5 seconds to improve gait stability.  Baseline: see above  Goal status: ongoing    2.  Patient will demonstrate 5/5 bilateral ankle strength to improve stability when navigating on uneven terrain.  Baseline: see above  Goal status: partially met    3.  Patient will demonstrate at least 4+/5 bilateral hip abductor strength to improve stability about the chain with prolonged walking/standing activity.  Baseline: see above  Goal status: ongoing    4.  Patient will score at least 40/80 on the LEFS to signify clinically meaningful improvement in  functional abilities.  Baseline: see above  Goal status: nearly met  5.  Patient will report pain at worst rated as </= 5/10 to reduce her current functional limitations.  Baseline: see above  Goal status: ongoing        PLAN:   PT FREQUENCY: 1x/week   PT DURATION: 6 weeks   PLANNED INTERVENTIONS: Therapeutic exercises, Therapeutic activity, Neuromuscular re-education, Balance training, Gait training, Patient/Family education, Self Care, Joint mobilization, Aquatic Therapy, Dry Needling, Cryotherapy, Moist heat, Manual therapy, and Re-evaluation   PLAN FOR NEXT SESSION: review and progress HEP, foot intrinsics, ankle/hip strengthening, calf stretching, consider TPDN.   Kevan Ny Hassan [place] PT 05/10/22 8:08 AM Phone: [phone] Fax: [phone]

## 2022-05-14 NOTE — Progress Notes (Signed)
Patient Care Team: Renaye Rakers, MD as PCP - General (Family Medicine) Parke Poisson, MD as PCP - Cardiology (Cardiology)  DIAGNOSIS:  Encounter Diagnosis  Name Primary?   Ductal carcinoma in situ (DCIS) of right breast Yes    SUMMARY OF ONCOLOGIC HISTORY: Oncology History  Ductal carcinoma in situ (DCIS) of right breast  11/06/2012 Mammogram    Numerous calcifications appear both linear in morphology and linear in distribution and extend over a large portion of the breast.  The longest area is 6.0 x 1.2 cm.  A second area is 6.0 x 1.4 cm.  Also closer to the nipple, two discrete calcifications   11/21/2012 Initial Diagnosis   Breast cancer of upper-outer quadrant of right female breast; ER 100% PR 100% DCIS with calcifications based on biopsy of the right breast.   11/26/2012 Breast MRI   upper inner quadrant of the posterior left breast, there is an 8 x 7 x 7 mm circumscribed enhancing mass;    03/25/2013 - 05/02/2013 Radiation Therapy   50 Gy to the right breast    05/19/2013 -  Anti-estrogen oral therapy   Tamoxifen 20 mg daily   02/23/2014 Surgery   Right Lumpectomy: At [facility]; DCIS, ER/PR Positive   06/26/2017 Surgery   Hysterectomy with BSO     CHIEF COMPLIANT: Follow-up of right breast DCIS on tamoxifen   INTERVAL HISTORY: Sheryl Cox is a 52 y.o. with above-mentioned history of right breast DCIS who underwent lumpectomy and is currently on tamoxifen. She presents to the clinic today for follow-up. She denies any pain, cramps or arthritis. She denies any vaginal dryness. She states that everything has been going well.   ALLERGIES:  is allergic to shrimp extract allergy skin test, shrimp [shellfish allergy], aleve [naproxen], and nickel.  MEDICATIONS:  Current Outpatient Medications  Medication Sig Dispense Refill   acetaminophen (TYLENOL) 325 MG tablet Take 650 mg by mouth daily as needed for headache.     acetaminophen-codeine 120-12 MG/5ML  solution TAKE BY MOUTH AT BEDTIME AS NEEDED     albuterol (VENTOLIN HFA) 108 (90 Base) MCG/ACT inhaler Inhale 1-2 puffs into the lungs every 6 (six) hours as needed for wheezing or shortness of breath. 18 g 0   ALPRAZolam (XANAX) 0.25 MG tablet TAKE 1 TABLET BY MOUTH TWICE A DAY AS NEEDED FOR ANXIETY     amLODipine (NORVASC) 10 MG tablet Take 10 mg by mouth daily.  3   Ascorbic Acid (VITAMIN C PO) Take by mouth.     celecoxib (CELEBREX) 200 MG capsule Take 1 capsule (200 mg total) by mouth 2 (two) times daily. 20 capsule 0   cyclobenzaprine (FLEXERIL) 10 MG tablet Take 0.5-1 tablets (5-10 mg total) by mouth 2 (two) times daily as needed for muscle spasms. 20 tablet 0   diclofenac Sodium (VOLTAREN) 1 % GEL Apply 4 g topically 4 (four) times daily. 100 g 0   ELDERBERRY PO Take by mouth.     erythromycin ophthalmic ointment Place a 1/2 inch ribbon of ointment into the lower eyelid. 3.5 g 0   fluconazole (DIFLUCAN) 150 MG tablet TAKE 1 TABLET (150 MG TOTAL) BY MOUTH ONCE A WEEK FOR 5 DOSES. 5 tablet 0   fluticasone (FLONASE) 50 MCG/ACT nasal spray Place 1 spray into both nostrils daily. 16 g 0   hydrochlorothiazide (HYDRODIURIL) 12.5 MG tablet Take 2 tablets (25 mg total) by mouth daily. (Patient taking differently: Take 12.5 mg by mouth daily.) 30  tablet 3   meclizine (ANTIVERT) 12.5 MG tablet Take 1 tablet (12.5 mg total) by mouth 3 (three) times daily as needed for dizziness. 30 tablet 0   metFORMIN (GLUCOPHAGE) 500 MG tablet Take 1 tablet (500 mg total) by mouth 2 (two) times daily with a meal. (Patient taking differently: Take 500 mg by mouth daily.) 60 tablet 2   methocarbamol (ROBAXIN) 500 MG tablet Take 1 tablet (500 mg total) by mouth 2 (two) times daily. 20 tablet 0   Multiple Vitamins-Minerals (MULTIVITAMIN PO) Take 1 tablet by mouth daily.     ondansetron (ZOFRAN) 4 MG tablet Take 1 tablet (4 mg total) by mouth every 6 (six) hours. 12 tablet 0   promethazine-dextromethorphan  (PROMETHAZINE-DM) 6.25-15 MG/5ML syrup Take 5 mLs by mouth 2 (two) times daily as needed for cough. 118 mL 0   Vitamin D, Ergocalciferol, (DRISDOL) 1.25 MG (50000 UNIT) CAPS capsule Take 50,000 Units by mouth once a week.     tamoxifen (NOLVADEX) 20 MG tablet Take 1 tablet (20 mg total) by mouth daily. 90 tablet 3   No current facility-administered medications for this visit.    PHYSICAL EXAMINATION: ECOG PERFORMANCE STATUS: 1 - Symptomatic but completely ambulatory  Vitals:   05/16/22 0911  BP: (!) 155/82  Pulse: 96  Resp: 18  Temp: (!) 97.5 F (36.4 C)  SpO2: 100%   Filed Weights   05/16/22 0911  Weight: 268 lb 8 oz (121.8 kg)      LABORATORY DATA:  I have reviewed the data as listed    Latest Ref Rng & Units 08/04/2019    9:25 PM 04/16/2019    6:10 PM 08/13/2018    9:33 PM  CMP  Glucose 70 - 99 mg/dL 409  811  914   BUN 6 - 20 mg/dL 19  12  15    Creatinine 0.44 - 1.00 mg/dL 7.82  9.56  2.13   Sodium 135 - 145 mmol/L 139  139  136   Potassium 3.5 - 5.1 mmol/L 4.0  3.6  3.4   Chloride 98 - 111 mmol/L 100  102  99   CO2 22 - 32 mmol/L 26  27  26    Calcium 8.9 - 10.3 mg/dL 9.8  9.3  9.9   Total Protein 6.5 - 8.1 g/dL 8.0     Total Bilirubin 0.3 - 1.2 mg/dL 0.3     Alkaline Phos 38 - 126 U/L 37     AST 15 - 41 U/L 15     ALT 0 - 44 U/L 16       Lab Results  Component Value Date   WBC 7.5 08/04/2019   HGB 11.5 (L) 08/04/2019   HCT 37.6 08/04/2019   MCV 78.8 (L) 08/04/2019   PLT 349 08/04/2019   NEUTROABS 4.6 08/04/2019    ASSESSMENT & PLAN:  Ductal carcinoma in situ (DCIS) of right breast Right breast DCIS ER/PR positive status post lumpectomy at [facility] currently on tamoxifen 10 mg daily March 2015   Tamoxifen Toxicities: Denies side effects of tamoxifen therapy. Patient wishes to stay on antiestrogen therapy with tamoxifen for 10 years even though data for DCIS is only up to 5. Patient understands risks and benefits.   Hypertension: On blood  pressure medication. Follows with her primary care physician. 2019 total hysterectomy with bilateral salpingo-oophorectomy.   Surveillance:  Mammogram at [facility] 04/26/2022: Left breast mass: 2.4 cm by ultrasound, biopsy: Pending Breast exam 05/16/2022: Benign   History of  mild microcytic anemia: She follows with her primary care physician. Obesity:  Once again we discussed about obesity and risk factor for breast cancer.  Encouraged her to stay more active and watch her diet.  She recently started Ozempic and is awaiting to see the results.   Return to clinic in 1 year for follow-up     No orders of the defined types were placed in this encounter.  The patient has a good understanding of the overall plan. she agrees with it. she will call with any problems that may develop before the next visit here. Total time spent: 30 mins including face to face time and time spent for planning, charting and co-ordination of care   Tamsen Meek, MD 05/16/22    I Janan Ridge am acting as a Neurosurgeon for [employer]  I have reviewed the above documentation for accuracy and completeness, and I agree with the above.

## 2022-05-15 ENCOUNTER — Ambulatory Visit: Payer: Medicaid Other | Admitting: Physical Therapy

## 2022-05-15 ENCOUNTER — Encounter: Payer: Self-pay | Admitting: Physical Therapy

## 2022-05-15 DIAGNOSIS — M25571 Pain in right ankle and joints of right foot: Secondary | ICD-10-CM

## 2022-05-15 DIAGNOSIS — M6281 Muscle weakness (generalized): Secondary | ICD-10-CM

## 2022-05-15 DIAGNOSIS — M25572 Pain in left ankle and joints of left foot: Secondary | ICD-10-CM

## 2022-05-15 DIAGNOSIS — R2689 Other abnormalities of gait and mobility: Secondary | ICD-10-CM

## 2022-05-15 NOTE — Therapy (Signed)
OUTPATIENT PHYSICAL THERAPY TREATMENT NOTE/RE-CERTIFICATION   Patient Name: Sheryl Cox MRN: [id number] DOB:Aug 25, 1970, 52 y.o., female Today's Date: 05/15/2022  PCP: Lucianne Lei, MD  REFERRING PROVIDER: Criselda Peaches, DPM   END OF SESSION:   PT End of Session - 05/15/22 1720     Visit Number 11    Number of Visits 14    Date for PT Re-Evaluation 05/26/22    Authorization Type [employer] MCD; 27 VL    PT Start Time 1720    PT Stop Time 1800    PT Time Calculation (min) 40 min    Activity Tolerance Patient tolerated treatment well    Behavior During Therapy WFL for tasks assessed/performed                 Past Medical History:  Diagnosis Date   Anemia    history of blood transfusion-no abnormal reaction noted   Asthma    Albuterol as needed   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+, PR+.Takes Tamoxifen daily   Diabetes mellitus without complication ([place])    takes Metformin daily   History of migraine    several yrs ago   Hypertension    takes Tribenzor daily   Past Surgical History:  Procedure Laterality Date   BIOPSY BREAST Left 12/05/12   fibroadenoma   BREAST LUMPECTOMY     2014   BREAST SURGERY Right    CESAREAN SECTION  01/05/2009   CHOLECYSTECTOMY N/A 01/31/2016   Procedure: LAPAROSCOPIC CHOLECYSTECTOMY;  Surgeon: Erroll Luna, MD;  Location: [facility];  Service: General;  Laterality: N/A;   DILITATION & CURRETTAGE/HYSTROSCOPY WITH HYDROTHERMAL ABLATION N/A 08/12/2013   Procedure: DILATATION & CURETTAGE/HYSTEROSCOPY WITH HYDROTHERMAL ABLATION;  Surgeon: Frederico Hamman, MD;  Location: [facility];  Service: Gynecology;  Laterality: N/A;   ROBOTIC ASSISTED TOTAL HYSTERECTOMY N/A 06/18/2017   Procedure: XI ROBOTIC ASSISTED TOTAL HYSTERECTOMY;  Surgeon: Lavonia Drafts, MD;  Location: [facility];  Service: Gynecology;  Laterality: N/A;   SALPINGOOPHORECTOMY Bilateral 06/18/2017   Procedure: BILATERAL SALPINGO OOPHORECTOMY;  Surgeon: Lavonia Drafts,  MD;  Location: [facility];  Service: Gynecology;  Laterality: Bilateral;   Patient Active Problem List   Diagnosis Date Noted   Chronic pain of both knees 05/21/2018   Chronic right shoulder pain 05/21/2018   BMI 40.0-44.9, adult ([place]) 05/21/2018   Anemia 06/05/2013   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   Nabothian cyst 12/07/2011   Cervical mass 11/20/2011   Fibroid uterus 11/20/2011    REFERRING DIAG:  M72.2 (ICD-10-CM) - Plantar fasciitis, bilateral  M76.72 (ICD-10-CM) - Peroneal tendinitis, left    THERAPY DIAG:  Pain in left ankle and joints of left foot  Pain in right ankle and joints of right foot  Muscle weakness (generalized)  Other abnormalities of gait and mobility  Rationale for Evaluation and Treatment Rehabilitation  PERTINENT HISTORY:  Breast cancer Diabetes Hypertension   PRECAUTIONS: none   SUBJECTIVE:  SUBJECTIVE STATEMENT:   Pt reports that her foot is feeling a little better.  She has been HEP compliant.   PAIN:  Are you having pain? Yes: NPRS scale: 8/10 L foot Pain location: bilateral plantar aspect of feet Pain description: sharp Aggravating factors: walking,standing Relieving factors: rest    OBJECTIVE: (objective measures completed at initial evaluation unless otherwise dated)  DIAGNOSTIC FINDINGS:  Multiple views x-ray of both feet: no fracture, dislocation, swelling or degenerative changes noted, plantar calcaneal spur, and pes planus   PATIENT SURVEYS:  LEFS 20/80 04/11/22: LEFS 39/80   COGNITION: Overall cognitive status: Within functional limits for tasks assessed                         SENSATION: WFL   EDEMA:  No obvious swelling        POSTURE: Pes planus in standing   PALPATION: TTP bilateral plantar fascia, Lt  peroneal tendons    LOWER EXTREMITY ROM:   Active ROM Right eval Left eval 02/15/22: 04/11/22  Hip flexion        Hip extension        Hip abduction        Hip adduction        Hip internal rotation        Hip external rotation        Knee flexion        Knee extension        Ankle dorsiflexion 0 Lacking 5  Lt: 0 Rt: 5  Lt: 3; Rt: 5   Ankle plantarflexion WNL WNL    Ankle inversion WNL WNL    Ankle eversion WNL WNL     (Blank rows = not tested)   LOWER EXTREMITY MMT:   MMT Right eval Left eval 02/15/22 04/11/22  Hip flexion 5 5    Hip extension        Hip abduction 3+ 3+  4-/5 bilateral  Hip adduction        Hip internal rotation        Hip external rotation        Knee flexion        Knee extension        Ankle dorsiflexion 5 5    Ankle plantarflexion DL partial calf raise DL partial calf raise  DL partial calf raise Able to complete DL calf raise   Ankle inversion 4 4  Lt: 5; Rt: 4  Ankle eversion 4 4  Lt: 4; Lt: 5    (Blank rows = not tested) *Pain with all MMT bilaterally*   LOWER EXTREMITY SPECIAL TESTS:  Windlass (+)    FUNCTIONAL TESTS:  SLS 1 second bilateral  04/11/22: 1 seconds RLE; 2 seconds LLE   GAIT: Distance walked: 10 ft  Assistive device utilized: None Level of assistance: Complete Independence Comments: maintain supination during stance, foot flat initial contact, limited push-off, WBOS, limited hip/knee flexion during gait cycle.     [facility] Adult PT Treatment:                                                DATE: 05/15/22 Therapeutic Exercise: NuStep level 6 LE only x 5 minutes  Toe raises 3 x 10 - on airex Calf raises 3 x 10 - on airex Hooklying resisted hip abduction black band  2 x 10  Towel scrunch bil - 1# added Bridge - 2x10  Neuromuscular re-ed: Tandem on foam - 45'' bouts wooden rocker board - DF/PF - 2'  Manual Therapy - STM plantar surface bil feet   [facility] Adult PT Treatment:                                                 DATE: 03/08/2022 Aquatic therapy at [address] - therapeutic pool temp 92 degrees Pt enters building ambulating independently  Treatment took place in water 3.8 to  4 ft 8 in.feet deep depending upon activity.  Pt entered and exited the pool via stair and handrails independently  Patient entered water for aquatic therapy for first time and was introduced to principles and therapeutic effects of water as they ambulated and acclimated to pool.  Therapeutic Exercise: Walking forward/backwards/side stepping x 2 laps each Lateral cross over walking Hip abd with rainbow dumbells Runners stretch on bottom step x30" BIL Calf stretch edge of bottom step 30''x3 Walking march Walking march with PF in SLS Single leg heel raise 2x20 BIL Water bell alternating shoulder flexion/ext and bil Holding dumbell with hip ext/flex with ankle resistance SLS with rotations - yellow dumbells Step up on first step with rails 2x10 fwd x10 lat Hip abd/add x20 BIL  Pt requires the buoyancy of water for active assisted exercises with buoyancy supported for strengthening and AROM exercises. Hydrostatic pressure also supports joints by unweighting joint load by at least 50 % in 3-4 feet depth water. 80% in chest to neck deep water. Water will provide assistance with movement using the current and laminar flow while the buoyancy reduces weight bearing. Pt requires the viscosity of the water for resistance with strengthening exercises.  [facility] Adult PT Treatment:                                                DATE: 02/15/22 Therapeutic Exercise: NuStep level 6 x 5 minutes  Calf stretch on wedge x 60 seconds each gastroc/soleus  Ankle rockerboard 2 x 10; A/P and lateral  Sidelying hip abduction 2 x 10  Hip bridge 2 x 10  SLR 2 x 10  Updated HEP          PATIENT EDUCATION:  Education details: see treatment Person educated: Patient Education method: Explanation, Demonstration, Tactile cues, Verbal  cues, and Handouts Education comprehension: verbalized understanding, returned demonstration, verbal cues required, tactile cues required, and needs further education   HOME EXERCISE PROGRAM: Access Code: [email or web address] URL: [email or web address] Date: 01/18/2022 Prepared by: Gwendolyn Grant   Exercises - Seated Calf Stretch with Strap  - 2 x daily - 7 x weekly - 3 sets - 30 sec  hold - Seated Great Toe Extension  - 2 x daily - 7 x weekly - 2 sets - 10 reps - Arch Lifting  - 2 x daily - 7 x weekly - 2 sets - 10 reps   ASSESSMENT:   CLINICAL IMPRESSION: Artie tolerated session well with no adverse reaction.  We concentrated of strengthening foot intrinsics.  She continues to show significant fatigue in the arch of the foot with activities on unstable surfaces and towel scrunches.  She reports  significant pain reduction following MT.    OBJECTIVE IMPAIRMENTS: Abnormal gait, decreased activity tolerance, decreased balance, decreased knowledge of condition, difficulty walking, decreased ROM, decreased strength, increased fascial restrictions, improper body mechanics, postural dysfunction, and pain.    ACTIVITY LIMITATIONS: carrying, lifting, bending, standing, squatting, stairs, transfers, and locomotion level   PARTICIPATION LIMITATIONS: meal prep, cleaning, laundry, shopping, community activity, and occupation   PERSONAL FACTORS: Age, Fitness, Profession, Time since onset of injury/illness/exacerbation, and 3+ comorbidities: see PMH above  are also affecting patient's functional outcome.    REHAB POTENTIAL: Fair chronicity of injury   CLINICAL DECISION MAKING: Stable/uncomplicated   EVALUATION COMPLEXITY: Low     GOALS: Goals reviewed with patient? Yes   SHORT TERM GOALS: Target date: 02/15/22 Patient will be independent and compliant with initial HEP.    Baseline: issued at eval  Goal status: met   2.  Patient will improve bilateral ankle DF AROM by at least 5  degrees to improve gait mechanics.  Baseline: see above  Goal status: met   3.  Patient will be able to perform full range DL calf raise to improve push-off during gait cycle.  Baseline: see above  Goal status: met      LONG TERM GOALS: Target date: 03/15/22   Patient will maintain SLS for at least 5 seconds to improve gait stability.  Baseline: see above  Goal status: ongoing    2.  Patient will demonstrate 5/5 bilateral ankle strength to improve stability when navigating on uneven terrain.  Baseline: see above  Goal status: partially met    3.  Patient will demonstrate at least 4+/5 bilateral hip abductor strength to improve stability about the chain with prolonged walking/standing activity.  Baseline: see above  Goal status: ongoing    4.  Patient will score at least 40/80 on the LEFS to signify clinically meaningful improvement in functional abilities.  Baseline: see above  Goal status: nearly met   5.  Patient will report pain at worst rated as </= 5/10 to reduce her current functional limitations.  Baseline: see above  Goal status: ongoing        PLAN:   PT FREQUENCY: 1x/week   PT DURATION: 6 weeks   PLANNED INTERVENTIONS: Therapeutic exercises, Therapeutic activity, Neuromuscular re-education, Balance training, Gait training, Patient/Family education, Self Care, Joint mobilization, Aquatic Therapy, Dry Needling, Cryotherapy, Moist heat, Manual therapy, and Re-evaluation   PLAN FOR NEXT SESSION: review and progress HEP, foot intrinsics, ankle/hip strengthening, calf stretching, consider TPDN.   Kevan Ny Keoni Havey PT 05/15/22 6:07 PM Phone: [phone] Fax: [phone]

## 2022-05-16 ENCOUNTER — Inpatient Hospital Stay: Payer: Medicaid Other | Attending: Hematology and Oncology | Admitting: Hematology and Oncology

## 2022-05-16 ENCOUNTER — Encounter: Payer: Self-pay | Admitting: *Deleted

## 2022-05-16 ENCOUNTER — Other Ambulatory Visit: Payer: Self-pay

## 2022-05-16 VITALS — BP 155/82 | HR 96 | Temp 97.5°F | Resp 18 | Ht 63.0 in | Wt 268.5 lb

## 2022-05-16 DIAGNOSIS — Z79899 Other long term (current) drug therapy: Secondary | ICD-10-CM | POA: Insufficient documentation

## 2022-05-16 DIAGNOSIS — D0511 Intraductal carcinoma in situ of right breast: Secondary | ICD-10-CM | POA: Diagnosis not present

## 2022-05-16 DIAGNOSIS — Z862 Personal history of diseases of the blood and blood-forming organs and certain disorders involving the immune mechanism: Secondary | ICD-10-CM | POA: Insufficient documentation

## 2022-05-16 MED ORDER — TAMOXIFEN CITRATE 20 MG PO TABS: 20.0000 mg | ORAL_TABLET | Freq: Every day | ORAL | 3 refills | Status: DC

## 2022-05-16 NOTE — Assessment & Plan Note (Addendum)
Right breast DCIS ER/PR positive status post lumpectomy at [facility] currently on tamoxifen 10 mg daily March 2015   Tamoxifen Toxicities: Denies side effects of tamoxifen therapy. Patient wishes to stay on antiestrogen therapy with tamoxifen for 10 years even though data for DCIS is only up to 5. Patient understands risks and benefits.   Hypertension: On blood pressure medication. Follows with her primary care physician. 2019 total hysterectomy with bilateral salpingo-oophorectomy.   Surveillance:  Mammogram at [facility] 04/26/2022: Left breast mass: 2.4 cm by ultrasound, biopsy: Pending Breast exam 05/16/2022: Benign   History of mild microcytic anemia: She follows with her primary care physician. Obesity:  Once again we discussed about obesity and risk factor for breast cancer.  Encouraged her to stay more active and watch her diet.  She recently started Ozempic and is awaiting to see the results.   Return to clinic in 1 year for follow-up

## 2022-05-17 ENCOUNTER — Ambulatory Visit: Payer: Medicaid Other | Admitting: Physical Therapy

## 2022-05-22 ENCOUNTER — Ambulatory Visit: Payer: Medicaid Other | Admitting: Physical Therapy

## 2022-05-23 ENCOUNTER — Ambulatory Visit: Payer: Medicaid Other | Attending: Podiatry | Admitting: Physical Therapy

## 2022-05-29 ENCOUNTER — Encounter: Payer: Self-pay | Admitting: Podiatry

## 2022-05-29 ENCOUNTER — Ambulatory Visit (INDEPENDENT_AMBULATORY_CARE_PROVIDER_SITE_OTHER): Payer: Medicaid Other | Admitting: Podiatry

## 2022-05-29 DIAGNOSIS — M722 Plantar fascial fibromatosis: Secondary | ICD-10-CM

## 2022-05-29 DIAGNOSIS — M7672 Peroneal tendinitis, left leg: Secondary | ICD-10-CM

## 2022-05-29 DIAGNOSIS — D509 Iron deficiency anemia, unspecified: Secondary | ICD-10-CM | POA: Insufficient documentation

## 2022-05-29 NOTE — Progress Notes (Signed)
  Subjective:  Patient ID: Sheryl Cox, female    DOB: [birth year],  MRN: [id number]  Chief Complaint  Patient presents with   Plantar Fasciitis    3 months follow up,  re-check peroneal tendinitis, recheck plantar fasciitis.    52 y.o. female presents with the above complaint. History confirmed with patient.  Overall doing well no pain in the right foot, she does have some pain in the lateral left foot, physical therapy was helpful and she would like to restart this  Objective:  Physical Exam: warm, good capillary refill, no trophic changes or ulcerative lesions, normal DP and PT pulses, normal sensory exam, tinea pedis, and today she has pain in the lateral heel, no peroneal pain  Radiographs: Multiple views x-ray of both feet: no fracture, dislocation, swelling or degenerative changes noted, plantar calcaneal spur, and pes planus Assessment:   1. Peroneal tendinitis, left   2. Plantar fasciitis, bilateral       Plan:  Patient was evaluated and treated and all questions answered.  Has had some digression relapse now pain on the lateral heel and the lateral band of the plantar fascia.  I recommended repeat corticosteroid injection again today.  Following sterile prep with alcohol, the lateral band of the plantar fascia was injected with 5 mg Kenalog and 2 mg dexamethasone 1 cc of 0.5% Marcaine plain.  She tolerated as well.  Referral was placed again to restart physical therapy for aqua therapy which was previously very helpful for her. No follow-ups on file.

## 2022-07-02 ENCOUNTER — Ambulatory Visit: Payer: Medicaid Other

## 2022-07-05 ENCOUNTER — Emergency Department (HOSPITAL_BASED_OUTPATIENT_CLINIC_OR_DEPARTMENT_OTHER): Payer: Medicaid Other | Admitting: Radiology

## 2022-07-05 ENCOUNTER — Emergency Department (HOSPITAL_BASED_OUTPATIENT_CLINIC_OR_DEPARTMENT_OTHER): Payer: Medicaid Other

## 2022-07-05 ENCOUNTER — Encounter (HOSPITAL_BASED_OUTPATIENT_CLINIC_OR_DEPARTMENT_OTHER): Payer: Self-pay

## 2022-07-05 ENCOUNTER — Emergency Department (HOSPITAL_BASED_OUTPATIENT_CLINIC_OR_DEPARTMENT_OTHER)
Admission: EM | Admit: 2022-07-05 | Discharge: 2022-07-05 | Disposition: A | Discharge status: Home / Self Care | Payer: Medicaid Other | Attending: Emergency Medicine | Admitting: Emergency Medicine

## 2022-07-05 ENCOUNTER — Other Ambulatory Visit: Payer: Self-pay

## 2022-07-05 DIAGNOSIS — Y92481 Parking lot as the place of occurrence of the external cause: Secondary | ICD-10-CM | POA: Insufficient documentation

## 2022-07-05 DIAGNOSIS — S161XXA Strain of muscle, fascia and tendon at neck level, initial encounter: Secondary | ICD-10-CM | POA: Diagnosis not present

## 2022-07-05 DIAGNOSIS — M25511 Pain in right shoulder: Secondary | ICD-10-CM | POA: Diagnosis not present

## 2022-07-05 DIAGNOSIS — S199XXA Unspecified injury of neck, initial encounter: Secondary | ICD-10-CM | POA: Diagnosis present

## 2022-07-05 MED ORDER — ACETAMINOPHEN 500 MG PO TABS
1000.0000 mg | ORAL_TABLET | Freq: Once | ORAL | Status: AC
  Administered 2022-07-05: 1000 mg via ORAL
  Filled 2022-07-05: qty 2

## 2022-07-05 MED ORDER — METHOCARBAMOL 500 MG PO TABS: 500.0000 mg | ORAL_TABLET | Freq: Two times a day (BID) | ORAL | 0 refills | Status: DC

## 2022-07-05 NOTE — Discharge Instructions (Signed)
It was a pleasure taking care of you today!  Your CT scans were negative for fracture or dislocation at this time. Attached is information for the on-call sports medicine doctor, call and set up a follow up appointment regarding todays ED visit. You are prescribed Robaxin.  Take medications as prescribed.  Do not operate any heavy machinery or drive while taking Robaxin as it can make you sleepy/drowsy.  You may apply ice/heat to the affected area for up to 15 minutes at a time.  Ensure to place a barrier between your skin and the ice/heat. Return to the Emergency Department if you are experiencing increasing/worsening symptoms.
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
## 2022-07-09 ENCOUNTER — Other Ambulatory Visit: Payer: Self-pay

## 2022-07-09 ENCOUNTER — Ambulatory Visit: Payer: Medicaid Other | Attending: Podiatry

## 2022-07-09 DIAGNOSIS — M6281 Muscle weakness (generalized): Secondary | ICD-10-CM | POA: Diagnosis present

## 2022-07-09 DIAGNOSIS — M7672 Peroneal tendinitis, left leg: Secondary | ICD-10-CM | POA: Diagnosis not present

## 2022-07-09 DIAGNOSIS — M25571 Pain in right ankle and joints of right foot: Secondary | ICD-10-CM

## 2022-07-09 DIAGNOSIS — R2689 Other abnormalities of gait and mobility: Secondary | ICD-10-CM | POA: Diagnosis present

## 2022-07-09 DIAGNOSIS — M25572 Pain in left ankle and joints of left foot: Secondary | ICD-10-CM
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
## 2022-07-17 ENCOUNTER — Encounter: Payer: Self-pay | Admitting: Physical Therapy

## 2022-07-17 ENCOUNTER — Ambulatory Visit: Payer: Medicaid Other | Admitting: Physical Therapy

## 2022-07-17 DIAGNOSIS — M6281 Muscle weakness (generalized): Secondary | ICD-10-CM

## 2022-07-17 DIAGNOSIS — R2689 Other abnormalities of gait and mobility: Secondary | ICD-10-CM

## 2022-07-17 DIAGNOSIS — M25571 Pain in right ankle and joints of right foot: Secondary | ICD-10-CM

## 2022-07-17 DIAGNOSIS — M25572 Pain in left ankle and joints of left foot: Secondary | ICD-10-CM | POA: Diagnosis not present

## 2022-07-17 NOTE — Therapy (Signed)
OUTPATIENT PHYSICAL THERAPY TREATMENT NOTE   Patient Name: Sheryl Cox MRN: [id number] DOB:Jul 29, 1970, 52 y.o., female Today's Date: 07/17/2022  PCP: Renaye Rakers, MD   REFERRING PROVIDER: Edwin Cap, DPM  END OF SESSION:   PT End of Session - 07/17/22 0810     Visit Number 2    Number of Visits 17    Date for PT Re-Evaluation 09/08/22    Authorization Type [employer] MCD; 27 VL    PT Start Time 0805    PT Stop Time 0845    PT Time Calculation (min) 40 min             Past Medical History:  Diagnosis Date   Anemia    history of blood transfusion-no abnormal reaction noted   Asthma    Albuterol as needed   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+, PR+.Takes Tamoxifen daily   Diabetes mellitus without complication (HCC)    takes Metformin daily   History of migraine    several yrs ago   Hypertension    takes Tribenzor daily   Past Surgical History:  Procedure Laterality Date   BIOPSY BREAST Left 12/05/12   fibroadenoma   BREAST LUMPECTOMY     2014   BREAST SURGERY Right    CESAREAN SECTION  01/05/2009   CHOLECYSTECTOMY N/A 01/31/2016   Procedure: LAPAROSCOPIC CHOLECYSTECTOMY;  Surgeon: Harriette Bouillon, MD;  Location: [facility];  Service: General;  Laterality: N/A;   DILITATION & CURRETTAGE/HYSTROSCOPY WITH HYDROTHERMAL ABLATION N/A 08/12/2013   Procedure: DILATATION & CURETTAGE/HYSTEROSCOPY WITH HYDROTHERMAL ABLATION;  Surgeon: Kathreen Cosier, MD;  Location: [facility];  Service: Gynecology;  Laterality: N/A;   ROBOTIC ASSISTED TOTAL HYSTERECTOMY N/A 06/18/2017   Procedure: XI ROBOTIC ASSISTED TOTAL HYSTERECTOMY;  Surgeon: Willodean Rosenthal, MD;  Location: [facility];  Service: Gynecology;  Laterality: N/A;   SALPINGOOPHORECTOMY Bilateral 06/18/2017   Procedure: BILATERAL SALPINGO OOPHORECTOMY;  Surgeon: Willodean Rosenthal, MD;  Location: [facility];  Service: Gynecology;  Laterality: Bilateral;   Patient Active Problem List   Diagnosis Date Noted   Iron  deficiency anemia 05/29/2022   Osteoarthritis of acromioclavicular joint 02/13/2022   Neck pain 08/03/2020   Chronic pain of both knees 05/21/2018   Chronic right shoulder pain 05/21/2018   BMI 40.0-44.9, adult (HCC) 05/21/2018   Anemia 06/05/2013   Menorrhagia 12/30/2012   Hypertension 12/10/2012   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   Nabothian cyst 12/07/2011   Cervical mass 11/20/2011   Fibroid uterus 11/20/2011    REFERRING DIAG:  M76.72 (ICD-10-CM) - Peroneal tendinitis, left   THERAPY DIAG:  Pain in left ankle and joints of left foot  Pain in right ankle and joints of right foot  Muscle weakness (generalized)  Other abnormalities of gait and mobility  Rationale for Evaluation and Treatment Rehabilitation  PERTINENT HISTORY: Breast cancer Diabetes Hypertension   PRECAUTIONS: none  SUBJECTIVE:  SUBJECTIVE STATEMENT:  I did the exercises. Just the left foot hurts right now. Both knees are the worst today. 8/10 pain in knees.    PAIN:  Are you having pain? Yes: NPRS scale: 6/10 Pain location: Lt posterolateral foot Pain description: sharp, ache Aggravating factors: walking, standing, "wrong shoes" Relieving factors: soaking,rest    OBJECTIVE: (objective measures completed at initial evaluation unless otherwise dated)   DIAGNOSTIC FINDINGS:  Multiple views x-ray of both feet: no fracture, dislocation, swelling or degenerative changes noted, plantar calcaneal spur, and pes planus   PATIENT SURVEYS:  LEFS 25/80   COGNITION: Overall cognitive status: Within functional limits for tasks assessed                         SENSATION: WFL   EDEMA:  Mild swelling about Lt lateral ankle      POSTURE: Pes planus in standing   PALPATION: TTP bilateral plantar  fascia, Lt peroneal tendons    LOWER EXTREMITY ROM:   Active ROM Right eval Left eval  Hip flexion      Hip extension      Hip abduction      Hip adduction      Hip internal rotation      Hip external rotation      Knee flexion      Knee extension      Ankle dorsiflexion 2 Lacking 4  Ankle plantarflexion WNL WNL  Ankle inversion WNL WNL  Ankle eversion WNL WNL   (Blank rows = not tested)   LOWER EXTREMITY MMT:   MMT Right eval Left eval  Hip flexion 5 5  Hip extension      Hip abduction 3+ 3+  Hip adduction      Hip internal rotation      Hip external rotation      Knee flexion      Knee extension      Ankle dorsiflexion 5 4  Ankle plantarflexion SL partial calf raise SL partial calf raise   Ankle inversion 4+ 4+  Ankle eversion 5 4+   (Blank rows = not tested) *Pain with all ankle MMT bilaterally*   LOWER EXTREMITY SPECIAL TESTS:  Windlass (-)    FUNCTIONAL TESTS:  SLS 3 second bilateral    GAIT: Distance walked: 10 ft  Assistive device utilized: None Level of assistance: Complete Independence Comments: foot flat initial contact, WBOS, excessive pronation, limited push-off    [facility] Adult PT Treatment:                                                DATE: 07/17/22 Therapeutic Exercise: Nustep L5 LE only x 5 minutes  Seated towel scrunches  Seated towel inversion /eversion bilateral  Seated Calf stretch with strap  Green band PF x 20 each  Red band Inv and ev x 10 each bilateral  Hip abduction 10 x 2 each Bridge 10 x 2  Hamstring stretch with strap supine x 1 each , 30 sec      [facility] Adult PT Treatment:                                                DATE:  07/09/22 Therapeutic Exercise: Demonstrated and issue initial HEP.    Therapeutic Activity: Education on assessment findings that will be addressed throughout duration of POC.            PATIENT EDUCATION:  Education details: see treatment Person educated: Patient Education method:  Explanation, Demonstration, Tactile cues, Verbal cues, and Handouts Education comprehension: verbalized understanding, returned demonstration, verbal cues required, tactile cues required, and needs further education   HOME EXERCISE PROGRAM: Access Code: [email or web address] URL: [email or web address]     ASSESSMENT:   CLINICAL IMPRESSION: Patient is a 52 y.o. female well known to our clinic who was seen today for physical therapy treatment for chronic bilateral foot pain (Lt>Rt) that has been ongoing for years of insidious onset.  She reports compliance with HEP and 6/10 left foot pain. She is also having bilateral knee pain today. Reviewed HEP and progressed with hip strengthening. She tolerated session well without complaints of increased pain. She will benefit from skilled PT in order to optimize her function and assist in overall pain reduction.    OBJECTIVE IMPAIRMENTS: Abnormal gait, decreased activity tolerance, decreased balance, decreased knowledge of condition, difficulty walking, decreased ROM, decreased strength, increased fascial restrictions, improper body mechanics, postural dysfunction, and pain.    ACTIVITY LIMITATIONS: carrying, lifting, bending, standing, squatting, stairs, transfers, and locomotion level   PARTICIPATION LIMITATIONS: meal prep, cleaning, laundry, shopping, community activity, and occupation   PERSONAL FACTORS: Age, Fitness, Profession, Time since onset of injury/illness/exacerbation, and 3+ comorbidities: see PMH above  are also affecting patient's functional outcome.    REHAB POTENTIAL: Fair chronicity of injury   CLINICAL DECISION MAKING: Stable/uncomplicated   EVALUATION COMPLEXITY: Low     GOALS: Goals reviewed with patient? Yes   SHORT TERM GOALS: Target date: 08/06/2022     Patient will be independent and compliant with initial HEP.    Baseline: issued at eval  Goal status: INITIAL   2.  Patient will improve bilateral ankle DF AROM by  at least 5 degrees to improve gait mechanics.  Baseline: see above  Goal status: INITIAL   3.  Patient will be able to perform full range SL calf raise to improve push-off during gait cycle.  Baseline: see above  Goal status: INITIAL     LONG TERM GOALS: Target date: 09/08/2022     Patient will maintain SLS for at least 8 seconds to improve gait stability.  Baseline: see above  Goal status: INITIAL   2.  Patient will demonstrate pain free 5/5 bilateral ankle strength to improve stability when navigating on uneven terrain.  Baseline: see above  Goal status: INITIAL   3.  Patient will demonstrate at least 4/5 bilateral hip abductor strength to improve stability about the chain with prolonged walking/standing activity.  Baseline: see above  Goal status: INITIAL   4.  Patient will score at least 40/80 on the LEFS to signify clinically meaningful improvement in functional abilities.  Baseline: see above  Goal status: INITIAL   5.  Patient will report pain at worst rated as </= 5/10 to reduce her current functional limitations.  Baseline: see above  Goal status: INITIAL   6. Patient will complete floor transfer independently to improve ability to transfer in/out of her bath tub.             Baseline: unable            Goal status: INITIAL         PLAN:   PT FREQUENCY: 1-2x/week  PT DURATION: 8 weeks   PLANNED INTERVENTIONS: Therapeutic exercises, Therapeutic activity, Neuromuscular re-education, Balance training, Gait training, Patient/Family education, Self Care, Joint mobilization, Aquatic Therapy, Dry Needling, Cryotherapy, Moist heat, Ionotophoresis 4mg /ml Dexamethasone, Manual therapy, and Re-evaluation   PLAN FOR NEXT SESSION: review and progress HEP, foot intrinsics, ankle/hip strengthening, calf stretching   Jannette Spanner, PTA 07/17/22 10:21 AM Phone: [phone] Fax: [phone]

## 2022-07-19 NOTE — Therapy (Signed)
OUTPATIENT PHYSICAL THERAPY TREATMENT NOTE   Patient Name: Sheryl Cox MRN: [id number] DOB:Sep 20, 1970, 52 y.o., female Today's Date: 07/20/2022  PCP: Renaye Rakers, MD   REFERRING PROVIDER: Edwin Cap, DPM  END OF SESSION:   PT End of Session - 07/20/22 1417     Visit Number 3    Number of Visits 17    Date for PT Re-Evaluation 09/08/22    Authorization Type [employer] MCD; 27 VL    PT Start Time 1430    PT Stop Time 1512    PT Time Calculation (min) 42 min    Activity Tolerance Patient tolerated treatment well    Behavior During Therapy WFL for tasks assessed/performed              Past Medical History:  Diagnosis Date   Anemia    history of blood transfusion-no abnormal reaction noted   Asthma    Albuterol as needed   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+, PR+.Takes Tamoxifen daily   Diabetes mellitus without complication (HCC)    takes Metformin daily   History of migraine    several yrs ago   Hypertension    takes Tribenzor daily   Past Surgical History:  Procedure Laterality Date   BIOPSY BREAST Left 12/05/12   fibroadenoma   BREAST LUMPECTOMY     2014   BREAST SURGERY Right    CESAREAN SECTION  01/05/2009   CHOLECYSTECTOMY N/A 01/31/2016   Procedure: LAPAROSCOPIC CHOLECYSTECTOMY;  Surgeon: Harriette Bouillon, MD;  Location: [facility];  Service: General;  Laterality: N/A;   DILITATION & CURRETTAGE/HYSTROSCOPY WITH HYDROTHERMAL ABLATION N/A 08/12/2013   Procedure: DILATATION & CURETTAGE/HYSTEROSCOPY WITH HYDROTHERMAL ABLATION;  Surgeon: Kathreen Cosier, MD;  Location: [facility];  Service: Gynecology;  Laterality: N/A;   ROBOTIC ASSISTED TOTAL HYSTERECTOMY N/A 06/18/2017   Procedure: XI ROBOTIC ASSISTED TOTAL HYSTERECTOMY;  Surgeon: Willodean Rosenthal, MD;  Location: [facility];  Service: Gynecology;  Laterality: N/A;   SALPINGOOPHORECTOMY Bilateral 06/18/2017   Procedure: BILATERAL SALPINGO OOPHORECTOMY;  Surgeon: Willodean Rosenthal, MD;  Location: [facility];   Service: Gynecology;  Laterality: Bilateral;   Patient Active Problem List   Diagnosis Date Noted   Iron deficiency anemia 05/29/2022   Osteoarthritis of acromioclavicular joint 02/13/2022   Neck pain 08/03/2020   Chronic pain of both knees 05/21/2018   Chronic right shoulder pain 05/21/2018   BMI 40.0-44.9, adult (HCC) 05/21/2018   Anemia 06/05/2013   Menorrhagia 12/30/2012   Hypertension 12/10/2012   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   Nabothian cyst 12/07/2011   Cervical mass 11/20/2011   Fibroid uterus 11/20/2011    REFERRING DIAG:  M76.72 (ICD-10-CM) - Peroneal tendinitis, left   THERAPY DIAG:  Pain in left ankle and joints of left foot  Pain in right ankle and joints of right foot  Muscle weakness (generalized)  Other abnormalities of gait and mobility  Rationale for Evaluation and Treatment Rehabilitation  PERTINENT HISTORY: Breast cancer Diabetes Hypertension   PRECAUTIONS: none  SUBJECTIVE:  SUBJECTIVE STATEMENT:  Patient reports increased pain in her Lt foot and in BIL knees today, states she has been HEP compliant.    PAIN:  Are you having pain? Yes: NPRS scale: 8/10 knees 9/10 L foot Pain location: Lt posterolateral foot Pain description: sharp, ache Aggravating factors: walking, standing, "wrong shoes" Relieving factors: soaking,rest    OBJECTIVE: (objective measures completed at initial evaluation unless otherwise dated)   DIAGNOSTIC FINDINGS:  Multiple views x-ray of both feet: no fracture, dislocation, swelling or degenerative changes noted, plantar calcaneal spur, and pes planus   PATIENT SURVEYS:  LEFS 25/80   COGNITION: Overall cognitive status: Within functional limits for tasks assessed                         SENSATION: WFL    EDEMA:  Mild swelling about Lt lateral ankle      POSTURE: Pes planus in standing   PALPATION: TTP bilateral plantar fascia, Lt peroneal tendons    LOWER EXTREMITY ROM:   Active ROM Right eval Left eval  Hip flexion      Hip extension      Hip abduction      Hip adduction      Hip internal rotation      Hip external rotation      Knee flexion      Knee extension      Ankle dorsiflexion 2 Lacking 4  Ankle plantarflexion WNL WNL  Ankle inversion WNL WNL  Ankle eversion WNL WNL   (Blank rows = not tested)   LOWER EXTREMITY MMT:   MMT Right eval Left eval  Hip flexion 5 5  Hip extension      Hip abduction 3+ 3+  Hip adduction      Hip internal rotation      Hip external rotation      Knee flexion      Knee extension      Ankle dorsiflexion 5 4  Ankle plantarflexion SL partial calf raise SL partial calf raise   Ankle inversion 4+ 4+  Ankle eversion 5 4+   (Blank rows = not tested) *Pain with all ankle MMT bilaterally*   LOWER EXTREMITY SPECIAL TESTS:  Windlass (-)    FUNCTIONAL TESTS:  SLS 3 second bilateral    GAIT: Distance walked: 10 ft  Assistive device utilized: None Level of assistance: Complete Independence Comments: foot flat initial contact, WBOS, excessive pronation, limited push-off    [facility] Adult PT Treatment:                                                DATE: 07/20/22 Aquatic therapy at [facility]- [address] - therapeutic pool temp approximately 92 degrees. Pt enters building ambulating independently. Treatment took place in water 3.8 to  4 ft 8 in.feet deep depending upon activity.  Pt entered and exited the pool via stair and handrails independently. Therapeutic Exercise: Aquatic Exercise: Walking forward/backwards/side stepping Marching with rainbow DB by sides x 2 laps Sidestepping with rainbow DB shoulder abd/add x2 laps Runners stretch on bottom step x30" BIL Hamstring stretch on bottom step x30" BIL Step ups on bottom step  fwd/lat x10 each BIL STS from 3rd step from bottom x10 no UE support Standing with UE support edge of pool: Calf stretch on wall x30" BIL Hip abd/add  x20 BIL Hip ext/flex with knee straight x 20 BIL Hip Circles CC/CCW x10 each BIL Marching hip flexion to knee extension 2x10 BIL Hamstring curl x20 BIL Squats 2x20  Pt requires the buoyancy of water for active assisted exercises with buoyancy supported for strengthening and AROM exercises. Hydrostatic pressure also supports joints by unweighting joint load by at least 50 % in 3-4 feet depth water. 80% in chest to neck deep water. Water will provide assistance with movement using the current and laminar flow while the buoyancy reduces weight bearing. Pt requires the viscosity of the water for resistance with strengthening exercises.   [facility] Adult PT Treatment:                                                DATE: 07/17/22 Therapeutic Exercise: Nustep L5 LE only x 5 minutes  Seated towel scrunches  Seated towel inversion /eversion bilateral  Seated Calf stretch with strap  Green band PF x 20 each  Red band Inv and ev x 10 each bilateral  Hip abduction 10 x 2 each Bridge 10 x 2  Hamstring stretch with strap supine x 1 each , 30 sec    [facility] Adult PT Treatment:                                                DATE: 07/09/22 Therapeutic Exercise: Demonstrated and issue initial HEP.    Therapeutic Activity: Education on assessment findings that will be addressed throughout duration of POC.            PATIENT EDUCATION:  Education details: see treatment Person educated: Patient Education method: Explanation, Demonstration, Tactile cues, Verbal cues, and Handouts Education comprehension: verbalized understanding, returned demonstration, verbal cues required, tactile cues required, and needs further education   HOME EXERCISE PROGRAM: Access Code: [email or web address] URL: [email or web address]     ASSESSMENT:   CLINICAL  IMPRESSION: Patient presents to aquatic PT reporting increased pain in her Lt foot and BIL knees today. Session today focused on LE strengthening, gait, and consistent movement for prolonged time in the aquatic environment for use of buoyancy to offload joints and the viscosity of water as resistance during therapeutic exercise. Patient was able to tolerate all prescribed exercises in the aquatic environment with no adverse effects. Patient continues to benefit from skilled PT services on land and aquatic based and should be progressed as able to improve functional independence.    OBJECTIVE IMPAIRMENTS: Abnormal gait, decreased activity tolerance, decreased balance, decreased knowledge of condition, difficulty walking, decreased ROM, decreased strength, increased fascial restrictions, improper body mechanics, postural dysfunction, and pain.    ACTIVITY LIMITATIONS: carrying, lifting, bending, standing, squatting, stairs, transfers, and locomotion level   PARTICIPATION LIMITATIONS: meal prep, cleaning, laundry, shopping, community activity, and occupation   PERSONAL FACTORS: Age, Fitness, Profession, Time since onset of injury/illness/exacerbation, and 3+ comorbidities: see PMH above  are also affecting patient's functional outcome.    REHAB POTENTIAL: Fair chronicity of injury   CLINICAL DECISION MAKING: Stable/uncomplicated   EVALUATION COMPLEXITY: Low     GOALS: Goals reviewed with patient? Yes   SHORT TERM GOALS: Target date: 08/06/2022     Patient will be independent and compliant with initial HEP.  Baseline: issued at eval  Goal status: INITIAL   2.  Patient will improve bilateral ankle DF AROM by at least 5 degrees to improve gait mechanics.  Baseline: see above  Goal status: INITIAL   3.  Patient will be able to perform full range SL calf raise to improve push-off during gait cycle.  Baseline: see above  Goal status: INITIAL     LONG TERM GOALS: Target date:  09/08/2022     Patient will maintain SLS for at least 8 seconds to improve gait stability.  Baseline: see above  Goal status: INITIAL   2.  Patient will demonstrate pain free 5/5 bilateral ankle strength to improve stability when navigating on uneven terrain.  Baseline: see above  Goal status: INITIAL   3.  Patient will demonstrate at least 4/5 bilateral hip abductor strength to improve stability about the chain with prolonged walking/standing activity.  Baseline: see above  Goal status: INITIAL   4.  Patient will score at least 40/80 on the LEFS to signify clinically meaningful improvement in functional abilities.  Baseline: see above  Goal status: INITIAL   5.  Patient will report pain at worst rated as </= 5/10 to reduce her current functional limitations.  Baseline: see above  Goal status: INITIAL   6. Patient will complete floor transfer independently to improve ability to transfer in/out of her bath tub.             Baseline: unable            Goal status: INITIAL         PLAN:   PT FREQUENCY: 1-2x/week   PT DURATION: 8 weeks   PLANNED INTERVENTIONS: Therapeutic exercises, Therapeutic activity, Neuromuscular re-education, Balance training, Gait training, Patient/Family education, Self Care, Joint mobilization, Aquatic Therapy, Dry Needling, Cryotherapy, Moist heat, Ionotophoresis 4mg /ml Dexamethasone, Manual therapy, and Re-evaluation   PLAN FOR NEXT SESSION: review and progress HEP, foot intrinsics, ankle/hip strengthening, calf stretching   Berta Minor PTA 07/20/22 3:15 PM Phone: [phone] Fax: [phone]

## 2022-07-20 ENCOUNTER — Ambulatory Visit: Payer: Medicaid Other | Attending: Podiatry

## 2022-07-20 DIAGNOSIS — M25572 Pain in left ankle and joints of left foot: Secondary | ICD-10-CM | POA: Insufficient documentation

## 2022-07-20 DIAGNOSIS — M25571 Pain in right ankle and joints of right foot: Secondary | ICD-10-CM | POA: Diagnosis present

## 2022-07-20 DIAGNOSIS — R2689 Other abnormalities of gait and mobility: Secondary | ICD-10-CM | POA: Insufficient documentation

## 2022-07-20 DIAGNOSIS — M6281 Muscle weakness (generalized): Secondary | ICD-10-CM | POA: Insufficient documentation

## 2022-07-23 NOTE — Progress Notes (Shared)
[facility] - Clinic Note  07/31/2022     CHIEF COMPLAINT Patient presents for Retina Evaluation   HISTORY OF PRESENT ILLNESS: Sheryl Cox is a 52 y.o. female who presents to the clinic today for:   HPI     Retina Evaluation   In left eye.  This started 1 week ago.  Duration of 1 week.  Associated Symptoms Flashes, Floaters, Pain and Photophobia.  Context:  distance vision, mid-range vision and near vision.  Response to treatment was no improvement.  I, the attending physician,  performed the HPI with the patient and updated documentation appropriately.        Comments   Pt here for ret eval due to pain, floaters and FOL OS, onset 1 wk ago. Pt reports she was experiencing pain and pressure in OS last week, she then woke up w/ redness in OS. VA is blurry, seeing FOL and floaters. Flashes not often but she is still seeing them. Pt is pre-diabetic, on Ozempic. Pts A1C in August 2023 was 6.6, pt reports her recent one was lower.       Last edited by Rennis Chris, MD on 08/01/2022  5:20 PM.    Pt states she remembers being at work and possibly hitting herself in OS accidentally while working. Woke up w/ redness in OS and FOL 1 wk ago.   Referring physician: Renaye Rakers, MD [address] [place],  [place] [address]  HISTORICAL INFORMATION:   Selected notes from the MEDICAL RECORD NUMBER ED follow up for concern of floaters OU   CURRENT MEDICATIONS: Current Outpatient Medications (Ophthalmic Drugs)  Medication Sig   erythromycin ophthalmic ointment Place a 1/2 inch ribbon of ointment into the lower eyelid.   No current facility-administered medications for this visit. (Ophthalmic Drugs)   Current Outpatient Medications (Other)  Medication Sig   acetaminophen (TYLENOL) 325 MG tablet Take 650 mg by mouth daily as needed for headache.   acetaminophen-codeine 120-12 MG/5ML solution TAKE BY MOUTH AT BEDTIME AS NEEDED   albuterol (VENTOLIN HFA) 108  (90 Base) MCG/ACT inhaler Inhale 1-2 puffs into the lungs every 6 (six) hours as needed for wheezing or shortness of breath.   ALPRAZolam (XANAX) 0.25 MG tablet TAKE 1 TABLET BY MOUTH TWICE A DAY AS NEEDED FOR ANXIETY   amLODipine (NORVASC) 10 MG tablet Take 10 mg by mouth daily.   Ascorbic Acid (VITAMIN C PO) Take by mouth.   celecoxib (CELEBREX) 200 MG capsule Take 1 capsule (200 mg total) by mouth 2 (two) times daily.   Cholecalciferol 1.25 MG (50000 UT) capsule    cyclobenzaprine (FLEXERIL) 10 MG tablet Take 0.5-1 tablets (5-10 mg total) by mouth 2 (two) times daily as needed for muscle spasms.   diclofenac Sodium (VOLTAREN) 1 % GEL Apply 4 g topically 4 (four) times daily.   ELDERBERRY PO Take by mouth.   fluticasone (FLONASE) 50 MCG/ACT nasal spray Place 1 spray into both nostrils daily.   hydrochlorothiazide (HYDRODIURIL) 12.5 MG tablet Take 2 tablets (25 mg total) by mouth daily. (Patient taking differently: Take 12.5 mg by mouth daily.)   meclizine (ANTIVERT) 12.5 MG tablet Take 1 tablet (12.5 mg total) by mouth 3 (three) times daily as needed for dizziness.   metFORMIN (GLUCOPHAGE) 500 MG tablet Take 1 tablet (500 mg total) by mouth 2 (two) times daily with a meal. (Patient taking differently: Take 500 mg by mouth daily.)   methocarbamol (ROBAXIN) 500 MG tablet  Take 1 tablet (500 mg total) by mouth 2 (two) times daily.   Multiple Vitamins-Minerals (MULTIVITAMIN PO) Take 1 tablet by mouth daily.   ondansetron (ZOFRAN) 4 MG tablet Take 1 tablet (4 mg total) by mouth every 6 (six) hours.   OZEMPIC, 2 MG/DOSE, 8 MG/3ML SOPN Inject 2 mg into the skin once a week.   promethazine-dextromethorphan (PROMETHAZINE-DM) 6.25-15 MG/5ML syrup Take 5 mLs by mouth 2 (two) times daily as needed for cough.   tamoxifen (NOLVADEX) 20 MG tablet Take 1 tablet (20 mg total) by mouth daily.   Vitamin D, Ergocalciferol, (DRISDOL) 1.25 MG (50000 UNIT) CAPS capsule Take 50,000 Units by mouth once a week.   No  current facility-administered medications for this visit. (Other)   REVIEW OF SYSTEMS: ROS   Positive for: Endocrine, Cardiovascular, Eyes Negative for: Constitutional, Gastrointestinal, Neurological, Skin, Genitourinary, Musculoskeletal, HENT, Respiratory, Psychiatric, Allergic/Imm, Heme/Lymph Last edited by Thompson Grayer, COT on 07/31/2022  8:54 AM.     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+, PR+.Takes Tamoxifen daily   Diabetes mellitus without complication (HCC)    takes Metformin daily   History of migraine    several yrs ago   Hypertension    takes Tribenzor daily   Past Surgical History:  Procedure Laterality Date   BIOPSY BREAST Left 12/05/12   fibroadenoma   BREAST LUMPECTOMY     2014   BREAST SURGERY Right    CESAREAN SECTION  01/05/2009   CHOLECYSTECTOMY N/A 01/31/2016   Procedure: LAPAROSCOPIC CHOLECYSTECTOMY;  Surgeon: Harriette Bouillon, MD;  Location: [facility] OR;  Service: General;  Laterality: N/A;   DILITATION & CURRETTAGE/HYSTROSCOPY WITH HYDROTHERMAL ABLATION N/A 08/12/2013   Procedure: DILATATION & CURETTAGE/HYSTEROSCOPY WITH HYDROTHERMAL ABLATION;  Surgeon: Kathreen Cosier, MD;  Location: [facility];  Service: Gynecology;  Laterality: N/A;   ROBOTIC ASSISTED TOTAL HYSTERECTOMY N/A 06/18/2017   Procedure: XI ROBOTIC ASSISTED TOTAL HYSTERECTOMY;  Surgeon: Willodean Rosenthal, MD;  Location: [facility];  Service: Gynecology;  Laterality: N/A;   SALPINGOOPHORECTOMY Bilateral 06/18/2017   Procedure: BILATERAL SALPINGO OOPHORECTOMY;  Surgeon: Willodean Rosenthal,  MD;  Location: [facility];  Service: Gynecology;  Laterality: Bilateral;   FAMILY HISTORY Family History  Problem Relation Age of Onset   Hypertension Mother    Diabetes Mother    COPD Mother    Macular degeneration Father    Glaucoma Father    Prostate cancer Father 90   Breast cancer Paternal Aunt    Prostate cancer Paternal Uncle    Breast cancer Paternal Grandmother        dx in her 72s   Glaucoma Paternal Grandfather    Prostate cancer Paternal Grandfather    Breast cancer Other        paternal grandmother's sister   Anesthesia problems Neg Hx    Amblyopia Neg Hx    Blindness Neg Hx    Cataracts Neg Hx    Retinal detachment Neg Hx    Strabismus Neg Hx    Retinitis pigmentosa Neg Hx    SOCIAL HISTORY  Social History   Tobacco Use   Smoking status: Never   Smokeless tobacco: Never  Vaping Use   Vaping Use: Never used  Substance Use Topics   Alcohol use: Not Currently    Alcohol/week: 1.0 standard drink of alcohol    Types: 1 Cans of beer per week    Comment: occa   Drug use: No       OPHTHALMIC EXAM: Base Eye Exam     Visual Acuity (Snellen - Linear)       Right Left   Dist [place] 20/30 -1 20/25 -2   Dist ph [place] 20/20 -1 20/20 -2         Tonometry (Tonopen, 9:03 AM)       Right Left   Pressure 20 18         Pupils       Dark Light Shape React APD   Right 2 1 Round Minimal None   Left 2 1 Round Minimal None         Visual Fields (Counting fingers)       Left Right    Full Full         Extraocular Movement       Right Left    Full, Ortho Full, Ortho         Neuro/Psych     Oriented x3: Yes   Mood/Affect: Normal         Dilation     Both eyes: 1.0% Mydriacyl, 2.5% Phenylephrine @ 9:04 AM           Slit Lamp and Fundus Exam     Slit Lamp Exam       Right Left   Lids/Lashes Normal Normal   Conjunctiva/Sclera White and quiet White and quiet   Cornea Clear Clear   Anterior Chamber Deep and quiet Deep and quiet   Iris  Round and moderately dilated, No NVI Round and moderately dilated, No NVI   Lens 2+ Cortical cataract, 2+ Nuclear sclerosis 2+ Cortical cataract, 2+ Nuclear sclerosis   Anterior Vitreous Vitreous syneresis, Posterior vitreous detachment Vitreous syneresis, PVD         Fundus Exam       Right Left   Disc Pink and Sharp, mild PPA Pink and Sharp compact, temp PPP   C/D Ratio 0.2 0.2   Macula Good foveal reflex, mild RPE mottling, No heme or edema Flat Good foveal reflex, mild RPE mottling, No heme or edema   Vessels Mild attenuation Mild attenuation   Periphery Attached, No RT/RD Attached, No RT/RD           IMAGING AND PROCEDURES  Imaging and Procedures for @TODAY @  OCT, Retina - OU - Both Eyes       Right Eye Quality was good. Central Foveal Thickness: 243. Progression has been stable. Findings include normal foveal contour, no IRF, no SRF (Trace vit opacities ).   Left Eye Quality was good. Central Foveal Thickness: 236. Progression has been stable. Findings include normal foveal contour, no IRF, no SRF (Interval release of VMA to full PVD.).   Notes *Images captured and stored on drive  Diagnosis / Impression:  OD: NFP, No IRF/SRF, trace vitreous opacities.  OS: NFP, No IRF/SRF, interval release of VMA to full PVD.   Clinical management:  See below  Abbreviations: NFP - Normal foveal profile. CME - cystoid macular edema. PED - pigment epithelial detachment. IRF - intraretinal fluid. SRF - subretinal fluid. EZ - ellipsoid  zone. ERM - epiretinal membrane. ORA - outer retinal atrophy. ORT - outer retinal tubulation. SRHM - subretinal hyper-reflective material               ASSESSMENT/PLAN:    ICD-10-CM   1. Posterior vitreous detachment of both eyes  H43.813 OCT, Retina - OU - Both Eyes    2. Diabetes mellitus type 2 without retinopathy (HCC)  E11.9     3. Long term (current) use of oral hypoglycemic drugs  Z79.84     4. Essential hypertension  I10      5. Hypertensive retinopathy of both eyes  H35.033     6. Combined forms of age-related cataract of both eyes  H25.813     7. Myopia of both eyes  H52.13      1. PVD / vitreous syneresis OU  - Presented w/ acute symptomatic floater OD (09.19.23)  - Presented w/ acute symptomatic FOL OS (05.14.24) starting 1 wk prior.  - Discussed findings and prognosis  - No RT or RD on 360 scleral depressed exam OS  - Reviewed s/s of RT/RD  - Strict return precautions for any such RT/RD signs/symptoms  - F/U 6-8 wks, DFE, OCT  2,3. Diabetes mellitus, type 2 without retinopathy - The incidence, risk factors for progression, natural history and treatment options for diabetic retinopathy  were discussed with patient.   - The need for close monitoring of blood glucose, blood pressure, and serum lipids, avoiding cigarette or any type of tobacco, and the need for long term follow up was also discussed with patient. - f/u in 1 year, sooner prn  4,5. Hypertensive retinopathy OU - discussed importance of tight BP control - monitor  6. Mixed Cataract OU - The symptoms of cataract, surgical options, and treatments and risks were discussed with patient. - discussed diagnosis and progression - monitor for now  7. Mild Myopia OU-  - doing well  - monitor  Ophthalmic Meds Ordered this visit:  No orders of the defined types were placed in this encounter.    Return for 6-8 wks, PVD OS, DFE, OCT.  There are no Patient Instructions on file for this visit.   Explained the diagnoses, plan, and follow up with the patient and they expressed understanding.  Patient expressed understanding of the importance of proper follow up care.   This document serves as a record of services personally performed by Karie Chimera, MD, PhD. It was created on their behalf by Gerilyn Nestle, COT an ophthalmic technician. The creation of this record is the provider's dictation and/or activities during the visit.     Electronically signed by:  Gerilyn Nestle, COT  05.06.24 5:21 PM  Karie Chimera, M.D., Ph.D. Diseases & Surgery of the Retina and Vitreous [facility]  I have reviewed the above documentation for accuracy and completeness, and I agree with the above. Karie Chimera, M.D., Ph.D. 08/01/22 5:22 PM  Abbreviations: M myopia (nearsighted); A astigmatism; H hyperopia (farsighted); P presbyopia; Mrx spectacle prescription;  CTL contact lenses; OD right eye; OS left eye; OU both eyes  XT exotropia; ET esotropia; PEK punctate epithelial keratitis; PEE punctate epithelial erosions; DES dry eye syndrome; MGD meibomian gland dysfunction; ATs artificial tears; PFAT's preservative free artificial tears; NSC nuclear sclerotic cataract; PSC posterior subcapsular cataract; ERM epi-retinal membrane; PVD posterior vitreous detachment; RD retinal detachment; DM diabetes mellitus; DR diabetic retinopathy; NPDR non-proliferative diabetic retinopathy; PDR proliferative diabetic retinopathy; CSME clinically significant macular edema;  DME diabetic macular edema; dbh dot blot hemorrhages; CWS cotton wool spot; POAG primary open angle glaucoma; C/D cup-to-disc ratio; HVF humphrey visual field; GVF goldmann visual field; OCT optical coherence tomography; IOP intraocular pressure; BRVO Branch retinal vein occlusion; CRVO central retinal vein occlusion; CRAO central retinal artery occlusion; BRAO branch retinal artery occlusion; RT retinal tear; SB scleral buckle; PPV pars plana vitrectomy; VH Vitreous hemorrhage; PRP panretinal laser photocoagulation; IVK intravitreal kenalog; VMT vitreomacular traction; MH Macular hole;  NVD neovascularization of the disc; NVE neovascularization elsewhere; AREDS age related eye disease study; ARMD age related macular degeneration; POAG primary open angle glaucoma; EBMD epithelial/anterior basement membrane dystrophy; ACIOL anterior chamber intraocular lens; IOL  intraocular lens; PCIOL posterior chamber intraocular lens; Phaco/IOL phacoemulsification with intraocular lens placement; [place] photorefractive keratectomy; LASIK laser assisted in situ keratomileusis; HTN hypertension; DM diabetes mellitus; COPD chronic obstructive pulmonary disease

## 2022-07-26 ENCOUNTER — Encounter: Payer: Self-pay | Admitting: Physical Therapy

## 2022-07-26 ENCOUNTER — Ambulatory Visit: Payer: Medicaid Other | Admitting: Physical Therapy

## 2022-07-26 DIAGNOSIS — M25571 Pain in right ankle and joints of right foot: Secondary | ICD-10-CM

## 2022-07-26 DIAGNOSIS — M25572 Pain in left ankle and joints of left foot: Secondary | ICD-10-CM | POA: Diagnosis not present

## 2022-07-26 NOTE — Therapy (Signed)
OUTPATIENT PHYSICAL THERAPY TREATMENT NOTE   Patient Name: Sheryl Cox MRN: [id number] DOB:[birth year], 52 y.o., female Today's Date: 07/26/2022  PCP: Renaye Rakers, MD   REFERRING PROVIDER: Edwin Cap, DPM  END OF SESSION:   PT End of Session - 07/26/22 1402     Visit Number 4    Number of Visits 17    Date for PT Re-Evaluation 09/08/22    Authorization Type [employer] MCD; 27 VL    PT Start Time 1400    PT Stop Time 1450    PT Time Calculation (min) 50 min              Past Medical History:  Diagnosis Date   Anemia    history of blood transfusion-no abnormal reaction noted   Asthma    Albuterol as needed   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+, PR+.Takes Tamoxifen daily   Diabetes mellitus without complication (HCC)    takes Metformin daily   History of migraine    several yrs ago   Hypertension    takes Tribenzor daily   Past Surgical History:  Procedure Laterality Date   BIOPSY BREAST Left 12/05/12   fibroadenoma   BREAST LUMPECTOMY     2014   BREAST SURGERY Right    CESAREAN SECTION  01/05/2009   CHOLECYSTECTOMY N/A 01/31/2016   Procedure: LAPAROSCOPIC CHOLECYSTECTOMY;  Surgeon: Harriette Bouillon, MD;  Location: [facility];  Service: General;  Laterality: N/A;   DILITATION & CURRETTAGE/HYSTROSCOPY WITH HYDROTHERMAL ABLATION N/A 08/12/2013   Procedure: DILATATION & CURETTAGE/HYSTEROSCOPY WITH HYDROTHERMAL ABLATION;  Surgeon: Kathreen Cosier, MD;  Location: [facility];  Service: Gynecology;  Laterality: N/A;   ROBOTIC ASSISTED TOTAL HYSTERECTOMY N/A 06/18/2017   Procedure: XI ROBOTIC ASSISTED TOTAL HYSTERECTOMY;  Surgeon: Willodean Rosenthal, MD;  Location: [facility];  Service: Gynecology;  Laterality: N/A;   SALPINGOOPHORECTOMY Bilateral 06/18/2017   Procedure: BILATERAL SALPINGO OOPHORECTOMY;  Surgeon: Willodean Rosenthal, MD;  Location: [facility];  Service: Gynecology;  Laterality: Bilateral;   Patient Active Problem List   Diagnosis Date Noted   Iron  deficiency anemia 05/29/2022   Osteoarthritis of acromioclavicular joint 02/13/2022   Neck pain 08/03/2020   Chronic pain of both knees 05/21/2018   Chronic right shoulder pain 05/21/2018   BMI 40.0-44.9, adult (HCC) 05/21/2018   Anemia 06/05/2013   Menorrhagia 12/30/2012   Hypertension 12/10/2012   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   Nabothian cyst 12/07/2011   Cervical mass 11/20/2011   Fibroid uterus 11/20/2011    REFERRING DIAG:  M76.72 (ICD-10-CM) - Peroneal tendinitis, left   THERAPY DIAG:  Pain in left ankle and joints of left foot  Pain in right ankle and joints of right foot  Rationale for Evaluation and Treatment Rehabilitation  PERTINENT HISTORY: Breast cancer Diabetes Hypertension   PRECAUTIONS: none  SUBJECTIVE:  SUBJECTIVE STATEMENT: I am having some tingling in my left foot and leg. Knees are okay today.    PAIN:  Are you having pain? Yes: NPRS scale: 9/10 left foot  Pain location: Lt posterolateral foot Pain description: sharp, ache Aggravating factors: walking, standing, "wrong shoes" Relieving factors: soaking,rest    OBJECTIVE: (objective measures completed at initial evaluation unless otherwise dated)   DIAGNOSTIC FINDINGS:  Multiple views x-ray of both feet: no fracture, dislocation, swelling or degenerative changes noted, plantar calcaneal spur, and pes planus   PATIENT SURVEYS:  LEFS 25/80   COGNITION: Overall cognitive status: Within functional limits for tasks assessed                         SENSATION: WFL   EDEMA:  Mild swelling about Lt lateral ankle      POSTURE: Pes planus in standing   PALPATION: TTP bilateral plantar fascia, Lt peroneal tendons    LOWER EXTREMITY ROM:   Active ROM Right eval Left eval Left   07/26/22  Hip flexion       Hip extension       Hip abduction       Hip adduction       Hip internal rotation       Hip external rotation       Knee flexion       Knee extension       Ankle dorsiflexion 2 Lacking 4 2  Ankle plantarflexion WNL WNL   Ankle inversion WNL WNL   Ankle eversion WNL WNL    (Blank rows = not tested)   LOWER EXTREMITY MMT:   MMT Right eval Left eval  Hip flexion 5 5  Hip extension      Hip abduction 3+ 3+  Hip adduction      Hip internal rotation      Hip external rotation      Knee flexion      Knee extension      Ankle dorsiflexion 5 4  Ankle plantarflexion SL partial calf raise SL partial calf raise   Ankle inversion 4+ 4+  Ankle eversion 5 4+   (Blank rows = not tested) *Pain with all ankle MMT bilaterally*   LOWER EXTREMITY SPECIAL TESTS:  Windlass (-)    FUNCTIONAL TESTS:  SLS 3 second bilateral    GAIT: Distance walked: 10 ft  Assistive device utilized: None Level of assistance: Complete Independence Comments: foot flat initial contact, WBOS, excessive pronation, limited push-off    [facility] Adult PT Treatment:                                                DATE: 07/26/22 Therapeutic Exercise: Seated towel scrunch  Seated towel scrunches  Seated Calf stretch with strap  Seated green band PF Left hamstring stretch with strap  Left hip abduction x 10 Standing gastroc stretch 2 x 30 sec each  Standing rocker board Tandem stance trials Seated heel raise 5# 15 x 2  Modalities: Cold pack to left ankle x 10 minutes     [facility] Adult PT Treatment:  DATE: 07/20/22 Aquatic therapy at [facility]- [address] - therapeutic pool temp approximately 92 degrees. Pt enters building ambulating independently. Treatment took place in water 3.8 to  4 ft 8 in.feet deep depending upon activity.  Pt entered and exited the pool via stair and handrails independently. Therapeutic Exercise: Aquatic  Exercise: Walking forward/backwards/side stepping Marching with rainbow DB by sides x 2 laps Sidestepping with rainbow DB shoulder abd/add x2 laps Runners stretch on bottom step x30" BIL Hamstring stretch on bottom step x30" BIL Step ups on bottom step fwd/lat x10 each BIL STS from 3rd step from bottom x10 no UE support Standing with UE support edge of pool: Calf stretch on wall x30" BIL Hip abd/add x20 BIL Hip ext/flex with knee straight x 20 BIL Hip Circles CC/CCW x10 each BIL Marching hip flexion to knee extension 2x10 BIL Hamstring curl x20 BIL Squats 2x20  Pt requires the buoyancy of water for active assisted exercises with buoyancy supported for strengthening and AROM exercises. Hydrostatic pressure also supports joints by unweighting joint load by at least 50 % in 3-4 feet depth water. 80% in chest to neck deep water. Water will provide assistance with movement using the current and laminar flow while the buoyancy reduces weight bearing. Pt requires the viscosity of the water for resistance with strengthening exercises.   [facility] Adult PT Treatment:                                                DATE: 07/17/22 Therapeutic Exercise: Nustep L5 LE only x 5 minutes  Seated towel scrunches  Seated towel inversion /eversion bilateral  Seated Calf stretch with strap  Green band PF x 20 each  Red band Inv and ev x 10 each bilateral  Hip abduction 10 x 2 each Bridge 10 x 2  Hamstring stretch with strap supine x 1 each , 30 sec      PATIENT EDUCATION:  Education details: see treatment Person educated: Patient Education method: Explanation, Demonstration, Tactile cues, Verbal cues, and Handouts Education comprehension: verbalized understanding, returned demonstration, verbal cues required, tactile cues required, and needs further education   HOME EXERCISE PROGRAM: Access Code: [email or web address] URL: [email or web address]     ASSESSMENT:   CLINICAL IMPRESSION: Patient  reports 9/10 left ankle pain. She reports compliance with HEP. AROM DF improved, not yet meeting goal. Progressed with standing gastroc stretch and standing heel raises. Updated HEP. Trial of ice pack to Left ankle for pain relief and education. Encouraged ice at home. Will consider ionto patch at future visit if Certification is signed by MD.   Patient continues to benefit from skilled PT services on land and aquatic based and should be progressed as able to improve functional independence.    OBJECTIVE IMPAIRMENTS: Abnormal gait, decreased activity tolerance, decreased balance, decreased knowledge of condition, difficulty walking, decreased ROM, decreased strength, increased fascial restrictions, improper body mechanics, postural dysfunction, and pain.    ACTIVITY LIMITATIONS: carrying, lifting, bending, standing, squatting, stairs, transfers, and locomotion level   PARTICIPATION LIMITATIONS: meal prep, cleaning, laundry, shopping, community activity, and occupation   PERSONAL FACTORS: Age, Fitness, Profession, Time since onset of injury/illness/exacerbation, and 3+ comorbidities: see PMH above  are also affecting patient's functional outcome.    REHAB POTENTIAL: Fair chronicity of injury   CLINICAL DECISION MAKING: Stable/uncomplicated   EVALUATION COMPLEXITY: Low  GOALS: Goals reviewed with patient? Yes   SHORT TERM GOALS: Target date: 08/06/2022     Patient will be independent and compliant with initial HEP.    Baseline: issued at eval  Goal status: INITIAL   2.  Patient will improve bilateral ankle DF AROM by at least 5 degrees to improve gait mechanics.  Baseline: see above  07/26/22: +2 Goal status: ONGOING   3.  Patient will be able to perform full range SL calf raise to improve push-off during gait cycle.  Baseline: see above  07/26/22: Min lift bilateral heel raise Goal status: ONGOING     LONG TERM GOALS: Target date: 09/08/2022     Patient will maintain SLS for  at least 8 seconds to improve gait stability.  Baseline: see above  Goal status: INITIAL   2.  Patient will demonstrate pain free 5/5 bilateral ankle strength to improve stability when navigating on uneven terrain.  Baseline: see above  Goal status: INITIAL   3.  Patient will demonstrate at least 4/5 bilateral hip abductor strength to improve stability about the chain with prolonged walking/standing activity.  Baseline: see above  Goal status: INITIAL   4.  Patient will score at least 40/80 on the LEFS to signify clinically meaningful improvement in functional abilities.  Baseline: see above  Goal status: INITIAL   5.  Patient will report pain at worst rated as </= 5/10 to reduce her current functional limitations.  Baseline: see above  Goal status: INITIAL   6. Patient will complete floor transfer independently to improve ability to transfer in/out of her bath tub.             Baseline: unable            Goal status: INITIAL         PLAN:   PT FREQUENCY: 1-2x/week   PT DURATION: 8 weeks   PLANNED INTERVENTIONS: Therapeutic exercises, Therapeutic activity, Neuromuscular re-education, Balance training, Gait training, Patient/Family education, Self Care, Joint mobilization, Aquatic Therapy, Dry Needling, Cryotherapy, Moist heat, Ionotophoresis 4mg /ml Dexamethasone, Manual therapy, and Re-evaluation   PLAN FOR NEXT SESSION: review and progress HEP, foot intrinsics, ankle/hip strengthening, calf stretching   Royden Purl PTA 07/26/22 3:39 PM Phone: [phone] Fax: [phone]

## 2022-07-30 ENCOUNTER — Telehealth: Payer: Self-pay

## 2022-07-30 ENCOUNTER — Ambulatory Visit: Payer: Medicaid Other

## 2022-07-30 NOTE — Telephone Encounter (Signed)
Spoke with patient regarding missed PT appointment. She was confused about scheduled appointment time. Confirmed next visit and reviewed attendance policy.   Letitia Libra, PT, DPT, ATC 07/30/22 1:49 PM

## 2022-07-31 ENCOUNTER — Encounter (INDEPENDENT_AMBULATORY_CARE_PROVIDER_SITE_OTHER): Payer: Self-pay | Admitting: Ophthalmology

## 2022-07-31 ENCOUNTER — Ambulatory Visit (INDEPENDENT_AMBULATORY_CARE_PROVIDER_SITE_OTHER): Payer: Medicaid Other | Admitting: Ophthalmology

## 2022-07-31 DIAGNOSIS — H43813 Vitreous degeneration, bilateral: Secondary | ICD-10-CM | POA: Diagnosis not present

## 2022-07-31 DIAGNOSIS — H43811 Vitreous degeneration, right eye: Secondary | ICD-10-CM

## 2022-07-31 DIAGNOSIS — H35033 Hypertensive retinopathy, bilateral: Secondary | ICD-10-CM

## 2022-07-31 DIAGNOSIS — I10 Essential (primary) hypertension: Secondary | ICD-10-CM

## 2022-07-31 DIAGNOSIS — Z7984 Long term (current) use of oral hypoglycemic drugs: Secondary | ICD-10-CM

## 2022-07-31 DIAGNOSIS — H25813 Combined forms of age-related cataract, bilateral: Secondary | ICD-10-CM

## 2022-07-31 DIAGNOSIS — E119 Type 2 diabetes mellitus without complications: Secondary | ICD-10-CM

## 2022-07-31 DIAGNOSIS — H5213 Myopia, bilateral: Secondary | ICD-10-CM

## 2022-08-01 ENCOUNTER — Encounter (INDEPENDENT_AMBULATORY_CARE_PROVIDER_SITE_OTHER): Payer: Self-pay | Admitting: Ophthalmology

## 2022-08-01 NOTE — Progress Notes (Signed)
[facility] - Clinic Note  07/31/2022     CHIEF COMPLAINT Patient presents for Retina Evaluation   HISTORY OF PRESENT ILLNESS: Sheryl Cox is a 52 y.o. female who presents to the clinic today for:   HPI     Retina Evaluation   In left eye.  This started 1 week ago.  Duration of 1 week.  Associated Symptoms Flashes, Floaters, Pain and Photophobia.  Context:  distance vision, mid-range vision and near vision.  Response to treatment was no improvement.  I, the attending physician,  performed the HPI with the patient and updated documentation appropriately.        Comments   Pt here for ret eval due to pain, floaters and FOL OS, onset 1 wk ago. Pt reports she was experiencing pain and pressure in OS last week, she then woke up w/ redness in OS. VA is blurry, seeing FOL and floaters. Flashes not often but she is still seeing them. Pt is pre-diabetic, on Ozempic. Pts A1C in August 2023 was 6.6, pt reports her recent one was lower.       Last edited by Rennis Chris, MD on 08/01/2022  5:20 PM.    Pt states she remembers being at work and possibly hitting herself in OS accidentally while working. Woke up w/ redness in OS and FOL 1 wk ago.   Referring physician: Renaye Rakers, MD [address] [place],  [place] [address]  HISTORICAL INFORMATION:   Selected notes from the MEDICAL RECORD NUMBER ED follow up for concern of floaters OU   CURRENT MEDICATIONS: Current Outpatient Medications (Ophthalmic Drugs)  Medication Sig   erythromycin ophthalmic ointment Place a 1/2 inch ribbon of ointment into the lower eyelid.   No current facility-administered medications for this visit. (Ophthalmic Drugs)   Current Outpatient Medications (Other)  Medication Sig   acetaminophen (TYLENOL) 325 MG tablet Take 650 mg by mouth daily as needed for headache.   acetaminophen-codeine 120-12 MG/5ML solution TAKE BY MOUTH AT BEDTIME AS NEEDED   albuterol (VENTOLIN HFA) 108  (90 Base) MCG/ACT inhaler Inhale 1-2 puffs into the lungs every 6 (six) hours as needed for wheezing or shortness of breath.   ALPRAZolam (XANAX) 0.25 MG tablet TAKE 1 TABLET BY MOUTH TWICE A DAY AS NEEDED FOR ANXIETY   amLODipine (NORVASC) 10 MG tablet Take 10 mg by mouth daily.   Ascorbic Acid (VITAMIN C PO) Take by mouth.   celecoxib (CELEBREX) 200 MG capsule Take 1 capsule (200 mg total) by mouth 2 (two) times daily.   Cholecalciferol 1.25 MG (50000 UT) capsule    cyclobenzaprine (FLEXERIL) 10 MG tablet Take 0.5-1 tablets (5-10 mg total) by mouth 2 (two) times daily as needed for muscle spasms.   diclofenac Sodium (VOLTAREN) 1 % GEL Apply 4 g topically 4 (four) times daily.   ELDERBERRY PO Take by mouth.   fluticasone (FLONASE) 50 MCG/ACT nasal spray Place 1 spray into both nostrils daily.   hydrochlorothiazide (HYDRODIURIL) 12.5 MG tablet Take 2 tablets (25 mg total) by mouth daily. (Patient taking differently: Take 12.5 mg by mouth daily.)   meclizine (ANTIVERT) 12.5 MG tablet Take 1 tablet (12.5 mg total) by mouth 3 (three) times daily as needed for dizziness.   metFORMIN (GLUCOPHAGE) 500 MG tablet Take 1 tablet (500 mg total) by mouth 2 (two) times daily with a meal. (Patient taking differently: Take 500 mg by mouth daily.)   methocarbamol (ROBAXIN) 500 MG tablet  Take 1 tablet (500 mg total) by mouth 2 (two) times daily.   Multiple Vitamins-Minerals (MULTIVITAMIN PO) Take 1 tablet by mouth daily.   ondansetron (ZOFRAN) 4 MG tablet Take 1 tablet (4 mg total) by mouth every 6 (six) hours.   OZEMPIC, 2 MG/DOSE, 8 MG/3ML SOPN Inject 2 mg into the skin once a week.   promethazine-dextromethorphan (PROMETHAZINE-DM) 6.25-15 MG/5ML syrup Take 5 mLs by mouth 2 (two) times daily as needed for cough.   tamoxifen (NOLVADEX) 20 MG tablet Take 1 tablet (20 mg total) by mouth daily.   Vitamin D, Ergocalciferol, (DRISDOL) 1.25 MG (50000 UNIT) CAPS capsule Take 50,000 Units by mouth once a week.   No  current facility-administered medications for this visit. (Other)   REVIEW OF SYSTEMS: ROS   Positive for: Endocrine, Cardiovascular, Eyes Negative for: Constitutional, Gastrointestinal, Neurological, Skin, Genitourinary, Musculoskeletal, HENT, Respiratory, Psychiatric, Allergic/Imm, Heme/Lymph Last edited by Thompson Grayer, COT on 07/31/2022  8:54 AM.     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+, PR+.Takes Tamoxifen daily   Diabetes mellitus without complication (HCC)    takes Metformin daily   History of migraine    several yrs ago   Hypertension    takes Tribenzor daily   Past Surgical History:  Procedure Laterality Date   BIOPSY BREAST Left 12/05/12   fibroadenoma   BREAST LUMPECTOMY     2014   BREAST SURGERY Right    CESAREAN SECTION  01/05/2009   CHOLECYSTECTOMY N/A 01/31/2016   Procedure: LAPAROSCOPIC CHOLECYSTECTOMY;  Surgeon: Harriette Bouillon, MD;  Location: [facility] OR;  Service: General;  Laterality: N/A;   DILITATION & CURRETTAGE/HYSTROSCOPY WITH HYDROTHERMAL ABLATION N/A 08/12/2013   Procedure: DILATATION & CURETTAGE/HYSTEROSCOPY WITH HYDROTHERMAL ABLATION;  Surgeon: Kathreen Cosier, MD;  Location: [facility]S;  Service: Gynecology;  Laterality: N/A;   ROBOTIC ASSISTED TOTAL HYSTERECTOMY N/A 06/18/2017   Procedure: XI ROBOTIC ASSISTED TOTAL HYSTERECTOMY;  Surgeon: Willodean Rosenthal, MD;  Location: [facility];  Service: Gynecology;  Laterality: N/A;   SALPINGOOPHORECTOMY Bilateral 06/18/2017   Procedure: BILATERAL SALPINGO OOPHORECTOMY;  Surgeon: Willodean Rosenthal,  MD;  Location: [facility];  Service: Gynecology;  Laterality: Bilateral;   FAMILY HISTORY Family History  Problem Relation Age of Onset   Hypertension Mother    Diabetes Mother    COPD Mother    Macular degeneration Father    Glaucoma Father    Prostate cancer Father 36   Breast cancer Paternal Aunt    Prostate cancer Paternal Uncle    Breast cancer Paternal Grandmother        dx in her 44s   Glaucoma Paternal Grandfather    Prostate cancer Paternal Grandfather    Breast cancer Other        paternal grandmother's sister   Anesthesia problems Neg Hx    Amblyopia Neg Hx    Blindness Neg Hx    Cataracts Neg Hx    Retinal detachment Neg Hx    Strabismus Neg Hx    Retinitis pigmentosa Neg Hx    SOCIAL HISTORY  Social History   Tobacco Use   Smoking status: Never   Smokeless tobacco: Never  Vaping Use   Vaping Use: Never used  Substance Use Topics   Alcohol use: Not Currently    Alcohol/week: 1.0 standard drink of alcohol    Types: 1 Cans of beer per week    Comment: occa   Drug use: No       OPHTHALMIC EXAM: Base Eye Exam     Visual Acuity (Snellen - Linear)       Right Left   Dist [place] 20/30 -1 20/25 -2   Dist ph [place] 20/20 -1 20/20 -2         Tonometry (Tonopen, 9:03 AM)       Right Left   Pressure 20 18         Pupils       Dark Light Shape React APD   Right 2 1 Round Minimal None   Left 2 1 Round Minimal None         Visual Fields (Counting fingers)       Left Right    Full Full         Extraocular Movement       Right Left    Full, Ortho Full, Ortho         Neuro/Psych     Oriented x3: Yes   Mood/Affect: Normal         Dilation     Both eyes: 1.0% Mydriacyl, 2.5% Phenylephrine @ 9:04 AM           Slit Lamp and Fundus Exam     Slit Lamp Exam       Right Left   Lids/Lashes Normal Normal   Conjunctiva/Sclera White and quiet White and quiet   Cornea Clear Clear   Anterior Chamber Deep and quiet Deep and quiet   Iris  Round and moderately dilated, No NVI Round and moderately dilated, No NVI   Lens 2+ Cortical cataract, 2+ Nuclear sclerosis 2+ Cortical cataract, 2+ Nuclear sclerosis   Anterior Vitreous Vitreous syneresis, Posterior vitreous detachment Vitreous syneresis, PVD         Fundus Exam       Right Left   Disc Pink and Sharp, mild PPA Pink and Sharp compact, temp PPP   C/D Ratio 0.2 0.2   Macula Good foveal reflex, mild RPE mottling, No heme or edema Flat Good foveal reflex, mild RPE mottling, No heme or edema   Vessels Mild attenuation Mild attenuation   Periphery Attached, No RT/RD Attached, No RT/RD           IMAGING AND PROCEDURES  Imaging and Procedures for @TODAY @  OCT, Retina - OU - Both Eyes       Right Eye Quality was good. Central Foveal Thickness: 243. Progression has been stable. Findings include normal foveal contour, no IRF, no SRF (Trace vit opacities ).   Left Eye Quality was good. Central Foveal Thickness: 236. Progression has been stable. Findings include normal foveal contour, no IRF, no SRF (Interval release of VMA to full PVD.).   Notes *Images captured and stored on drive  Diagnosis / Impression:  OD: NFP, No IRF/SRF, trace vitreous opacities.  OS: NFP, No IRF/SRF, interval release of VMA to full PVD.   Clinical management:  See below  Abbreviations: NFP - Normal foveal profile. CME - cystoid macular edema. PED - pigment epithelial detachment. IRF - intraretinal fluid. SRF - subretinal fluid. EZ - ellipsoid  zone. ERM - epiretinal membrane. ORA - outer retinal atrophy. ORT - outer retinal tubulation. SRHM - subretinal hyper-reflective material               ASSESSMENT/PLAN:    ICD-10-CM   1. Posterior vitreous detachment of both eyes  H43.813 OCT, Retina - OU - Both Eyes    2. Diabetes mellitus type 2 without retinopathy (HCC)  E11.9     3. Long term (current) use of oral hypoglycemic drugs  Z79.84     4. Essential hypertension  I10      5. Hypertensive retinopathy of both eyes  H35.033     6. Combined forms of age-related cataract of both eyes  H25.813     7. Myopia of both eyes  H52.13      1. PVD / vitreous syneresis OU  - Presented w/ acute symptomatic floater OD (09.19.23)  - Presented w/ acute symptomatic FOL OS (05.14.24) starting 1 wk prior.  - Discussed findings and prognosis  - No RT or RD on 360 scleral depressed exam OS  - Reviewed s/s of RT/RD  - Strict return precautions for any such RT/RD signs/symptoms  - F/U 6-8 wks, DFE, OCT  2,3. Diabetes mellitus, type 2 without retinopathy - The incidence, risk factors for progression, natural history and treatment options for diabetic retinopathy  were discussed with patient.   - The need for close monitoring of blood glucose, blood pressure, and serum lipids, avoiding cigarette or any type of tobacco, and the need for long term follow up was also discussed with patient. - f/u in 1 year, sooner prn  4,5. Hypertensive retinopathy OU - discussed importance of tight BP control - monitor  6. Mixed Cataract OU - The symptoms of cataract, surgical options, and treatments and risks were discussed with patient. - discussed diagnosis and progression - monitor for now  7. Mild Myopia OU-  - doing well  - monitor  Ophthalmic Meds Ordered this visit:  No orders of the defined types were placed in this encounter.    Return for 6-8 wks, PVD OS, DFE, OCT.  There are no Patient Instructions on file for this visit.   Explained the diagnoses, plan, and follow up with the patient and they expressed understanding.  Patient expressed understanding of the importance of proper follow up care.   This document serves as a record of services personally performed by Karie Chimera, MD, PhD. It was created on their behalf by Gerilyn Nestle, COT an ophthalmic technician. The creation of this record is the provider's dictation and/or activities during the visit.     Electronically signed by:  Gerilyn Nestle, COT  05.06.24 5:23 PM  Karie Chimera, M.D., Ph.D. Diseases & Surgery of the Retina and Vitreous [facility]  I have reviewed the above documentation for accuracy and completeness, and I agree with the above. Karie Chimera, M.D., Ph.D. 08/01/22 5:23 PM  Abbreviations: M myopia (nearsighted); A astigmatism; H hyperopia (farsighted); P presbyopia; Mrx spectacle prescription;  CTL contact lenses; OD right eye; OS left eye; OU both eyes  XT exotropia; ET esotropia; PEK punctate epithelial keratitis; PEE punctate epithelial erosions; DES dry eye syndrome; MGD meibomian gland dysfunction; ATs artificial tears; PFAT's preservative free artificial tears; NSC nuclear sclerotic cataract; PSC posterior subcapsular cataract; ERM epi-retinal membrane; PVD posterior vitreous detachment; RD retinal detachment; DM diabetes mellitus; DR diabetic retinopathy; NPDR non-proliferative diabetic retinopathy; PDR proliferative diabetic retinopathy; CSME clinically significant macular edema;  DME diabetic macular edema; dbh dot blot hemorrhages; CWS cotton wool spot; POAG primary open angle glaucoma; C/D cup-to-disc ratio; HVF humphrey visual field; GVF goldmann visual field; OCT optical coherence tomography; IOP intraocular pressure; BRVO Branch retinal vein occlusion; CRVO central retinal vein occlusion; CRAO central retinal artery occlusion; BRAO branch retinal artery occlusion; RT retinal tear; SB scleral buckle; PPV pars plana vitrectomy; VH Vitreous hemorrhage; PRP panretinal laser photocoagulation; IVK intravitreal kenalog; VMT vitreomacular traction; MH Macular hole;  NVD neovascularization of the disc; NVE neovascularization elsewhere; AREDS age related eye disease study; ARMD age related macular degeneration; POAG primary open angle glaucoma; EBMD epithelial/anterior basement membrane dystrophy; ACIOL anterior chamber intraocular lens; IOL  intraocular lens; PCIOL posterior chamber intraocular lens; Phaco/IOL phacoemulsification with intraocular lens placement; [place] photorefractive keratectomy; LASIK laser assisted in situ keratomileusis; HTN hypertension; DM diabetes mellitus; COPD chronic obstructive pulmonary disease

## 2022-08-02 ENCOUNTER — Ambulatory Visit: Payer: Medicaid Other | Admitting: Physical Therapy

## 2022-08-03 ENCOUNTER — Ambulatory Visit: Payer: Medicaid Other

## 2022-08-03 DIAGNOSIS — M25571 Pain in right ankle and joints of right foot: Secondary | ICD-10-CM

## 2022-08-03 DIAGNOSIS — M6281 Muscle weakness (generalized): Secondary | ICD-10-CM

## 2022-08-03 DIAGNOSIS — R2689 Other abnormalities of gait and mobility: Secondary | ICD-10-CM

## 2022-08-03 DIAGNOSIS — M25572 Pain in left ankle and joints of left foot: Secondary | ICD-10-CM

## 2022-08-03 NOTE — Therapy (Signed)
OUTPATIENT PHYSICAL THERAPY TREATMENT NOTE   Patient Name: Sheryl Cox MRN: [id number] DOB:March 14, 1971, 52 y.o., female Today's Date: 08/03/2022  PCP: Renaye Rakers, MD   REFERRING PROVIDER: Edwin Cap, DPM  END OF SESSION:   PT End of Session - 08/03/22 1057     Visit Number 5    Number of Visits 17    Date for PT Re-Evaluation 09/08/22    Authorization Type [employer] MCD; 27 VL    PT Start Time 1057    PT Stop Time 1140    PT Time Calculation (min) 43 min    Activity Tolerance Patient tolerated treatment well    Behavior During Therapy WFL for tasks assessed/performed               Past Medical History:  Diagnosis Date   Anemia    history of blood transfusion-no abnormal reaction noted   Asthma    Albuterol as needed   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+, PR+.Takes Tamoxifen daily   Diabetes mellitus without complication (HCC)    takes Metformin daily   History of migraine    several yrs ago   Hypertension    takes Tribenzor daily   Past Surgical History:  Procedure Laterality Date   BIOPSY BREAST Left 12/05/12   fibroadenoma   BREAST LUMPECTOMY     2014   BREAST SURGERY Right    CESAREAN SECTION  01/05/2009   CHOLECYSTECTOMY N/A 01/31/2016   Procedure: LAPAROSCOPIC CHOLECYSTECTOMY;  Surgeon: Harriette Bouillon, MD;  Location: [facility];  Service: General;  Laterality: N/A;   DILITATION & CURRETTAGE/HYSTROSCOPY WITH HYDROTHERMAL ABLATION N/A 08/12/2013   Procedure: DILATATION & CURETTAGE/HYSTEROSCOPY WITH HYDROTHERMAL ABLATION;  Surgeon: Kathreen Cosier, MD;  Location: [facility];  Service: Gynecology;  Laterality: N/A;   ROBOTIC ASSISTED TOTAL HYSTERECTOMY N/A 06/18/2017   Procedure: XI ROBOTIC ASSISTED TOTAL HYSTERECTOMY;  Surgeon: Willodean Rosenthal, MD;  Location: [facility];  Service: Gynecology;  Laterality: N/A;   SALPINGOOPHORECTOMY Bilateral 06/18/2017   Procedure: BILATERAL SALPINGO OOPHORECTOMY;  Surgeon: Willodean Rosenthal, MD;  Location: [facility];  Service: Gynecology;  Laterality: Bilateral;   Patient Active Problem List   Diagnosis Date Noted   Iron deficiency anemia 05/29/2022   Osteoarthritis of acromioclavicular joint 02/13/2022   Neck pain 08/03/2020   Chronic pain of both knees 05/21/2018   Chronic right shoulder pain 05/21/2018   BMI 40.0-44.9, adult (HCC) 05/21/2018   Anemia 06/05/2013   Menorrhagia 12/30/2012   Hypertension 12/10/2012   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   Nabothian cyst 12/07/2011   Cervical mass 11/20/2011   Fibroid uterus 11/20/2011    REFERRING DIAG:  M76.72 (ICD-10-CM) - Peroneal tendinitis, left   THERAPY DIAG:  Pain in left ankle and joints of left foot  Pain in right ankle and joints of right foot  Muscle weakness (generalized)  Other abnormalities of gait and mobility  Rationale for Evaluation and Treatment Rehabilitation  PERTINENT HISTORY: Breast cancer Diabetes Hypertension   PRECAUTIONS: none  SUBJECTIVE:  SUBJECTIVE STATEMENT: "I am feeling it just on the outside."   PAIN:  Are you having pain? Yes: NPRS scale: 7/10  Pain location: Lt posterolateral foot Pain description: ache Aggravating factors: walking, standing, "wrong shoes" Relieving factors: soaking,rest    OBJECTIVE: (objective measures completed at initial evaluation unless otherwise dated)   DIAGNOSTIC FINDINGS:  Multiple views x-ray of both feet: no fracture, dislocation, swelling or degenerative changes noted, plantar calcaneal spur, and pes planus   PATIENT SURVEYS:  LEFS 25/80   COGNITION: Overall cognitive status: Within functional limits for tasks assessed                         SENSATION: WFL   EDEMA:  Mild swelling about Lt lateral ankle      POSTURE: Pes planus in  standing   PALPATION: TTP bilateral plantar fascia, Lt peroneal tendons    LOWER EXTREMITY ROM:   Active ROM Right eval Left eval Left  07/26/22 08/03/22 Left   Hip flexion        Hip extension        Hip abduction        Hip adduction        Hip internal rotation        Hip external rotation        Knee flexion        Knee extension        Ankle dorsiflexion 2 Lacking 4 2 4   Ankle plantarflexion WNL WNL    Ankle inversion WNL WNL    Ankle eversion WNL WNL     (Blank rows = not tested)   LOWER EXTREMITY MMT:   MMT Right eval Left eval  Hip flexion 5 5  Hip extension      Hip abduction 3+ 3+  Hip adduction      Hip internal rotation      Hip external rotation      Knee flexion      Knee extension      Ankle dorsiflexion 5 4  Ankle plantarflexion SL partial calf raise SL partial calf raise   Ankle inversion 4+ 4+  Ankle eversion 5 4+   (Blank rows = not tested) *Pain with all ankle MMT bilaterally*   LOWER EXTREMITY SPECIAL TESTS:  Windlass (-)    FUNCTIONAL TESTS:  SLS 3 second bilateral    GAIT: Distance walked: 10 ft  Assistive device utilized: None Level of assistance: Complete Independence Comments: foot flat initial contact, WBOS, excessive pronation, limited push-off  [facility] Adult PT Treatment:                                                DATE: 08/03/22 Therapeutic Exercise: NuStep level 5 LE only x 5 minutes  Calf stretch on wedge x 1 minute  Hip bridge 2 x 10  SLR 2 x 10  Sidelying hip abduction 2 x 10  Resisted ankle PF black band 2 x 10  Updated HEP  Manual Therapy: IASTM Lt peroneals, gastroc, and plantar fascia    [facility] Adult PT Treatment:  DATE: 07/26/22 Therapeutic Exercise: Seated towel scrunch  Seated towel scrunches  Seated Calf stretch with strap  Seated green band PF Left hamstring stretch with strap  Left hip abduction x 10 Standing gastroc stretch 2 x 30 sec each  Standing rocker  board Tandem stance trials Seated heel raise 5# 15 x 2  Modalities: Cold pack to left ankle x 10 minutes     [facility] Adult PT Treatment:                                                DATE: 07/20/22 Aquatic therapy at [facility]- [address] - therapeutic pool temp approximately 92 degrees. Pt enters building ambulating independently. Treatment took place in water 3.8 to  4 ft 8 in.feet deep depending upon activity.  Pt entered and exited the pool via stair and handrails independently. Therapeutic Exercise: Aquatic Exercise: Walking forward/backwards/side stepping Marching with rainbow DB by sides x 2 laps Sidestepping with rainbow DB shoulder abd/add x2 laps Runners stretch on bottom step x30" BIL Hamstring stretch on bottom step x30" BIL Step ups on bottom step fwd/lat x10 each BIL STS from 3rd step from bottom x10 no UE support Standing with UE support edge of pool: Calf stretch on wall x30" BIL Hip abd/add x20 BIL Hip ext/flex with knee straight x 20 BIL Hip Circles CC/CCW x10 each BIL Marching hip flexion to knee extension 2x10 BIL Hamstring curl x20 BIL Squats 2x20  Pt requires the buoyancy of water for active assisted exercises with buoyancy supported for strengthening and AROM exercises. Hydrostatic pressure also supports joints by unweighting joint load by at least 50 % in 3-4 feet depth water. 80% in chest to neck deep water. Water will provide assistance with movement using the current and laminar flow while the buoyancy reduces weight bearing. Pt requires the viscosity of the water for resistance with strengthening exercises.     PATIENT EDUCATION:  Education details: see treatment Person educated: Patient Education method: Explanation, Demonstration, Tactile cues, Verbal cues, and Handouts Education comprehension: verbalized understanding, returned demonstration, verbal cues required, tactile cues required, and needs further education   HOME EXERCISE  PROGRAM: Access Code: [email or web address] URL: [email or web address]     ASSESSMENT:   CLINICAL IMPRESSION: Patient arrives with moderate Lt ankle pain. IASTM performed to peroneals, gastroc, and plantar fascia with patient noted to have significant tautness about peroneals and gastroc. Discussed potential benefit of TPDN with patient potentially interested in trying at future sessions. Focused primarily on ankle strengthening and proximal hip strengthening with good tolerance. She quickly fatigues with hip strengthening. With resisted plantarflexion she has difficulty maintaining ankle in neutral positioning, having tendency to invert. DF AROM continues to gradually improve, nearing the STG. She reported improvement in her ankle pain at end of session rated as 4/10.    OBJECTIVE IMPAIRMENTS: Abnormal gait, decreased activity tolerance, decreased balance, decreased knowledge of condition, difficulty walking, decreased ROM, decreased strength, increased fascial restrictions, improper body mechanics, postural dysfunction, and pain.    ACTIVITY LIMITATIONS: carrying, lifting, bending, standing, squatting, stairs, transfers, and locomotion level   PARTICIPATION LIMITATIONS: meal prep, cleaning, laundry, shopping, community activity, and occupation   PERSONAL FACTORS: Age, Fitness, Profession, Time since onset of injury/illness/exacerbation, and 3+ comorbidities: see PMH above  are also affecting patient's functional outcome.    REHAB POTENTIAL: Fair chronicity of injury   CLINICAL  DECISION MAKING: Stable/uncomplicated   EVALUATION COMPLEXITY: Low     GOALS: Goals reviewed with patient? Yes   SHORT TERM GOALS: Target date: 08/06/2022     Patient will be independent and compliant with initial HEP.    Baseline: issued at eval  Goal status: met   2.  Patient will improve bilateral ankle DF AROM by at least 5 degrees to improve gait mechanics.  Baseline: see above  07/26/22: +2 Goal  status: ONGOING   3.  Patient will be able to perform full range SL calf raise to improve push-off during gait cycle.  Baseline: see above  07/26/22: Min lift bilateral heel raise Goal status: ONGOING     LONG TERM GOALS: Target date: 09/08/2022     Patient will maintain SLS for at least 8 seconds to improve gait stability.  Baseline: see above  Goal status: INITIAL   2.  Patient will demonstrate pain free 5/5 bilateral ankle strength to improve stability when navigating on uneven terrain.  Baseline: see above  Goal status: INITIAL   3.  Patient will demonstrate at least 4/5 bilateral hip abductor strength to improve stability about the chain with prolonged walking/standing activity.  Baseline: see above  Goal status: INITIAL   4.  Patient will score at least 40/80 on the LEFS to signify clinically meaningful improvement in functional abilities.  Baseline: see above  Goal status: INITIAL   5.  Patient will report pain at worst rated as </= 5/10 to reduce her current functional limitations.  Baseline: see above  Goal status: INITIAL   6. Patient will complete floor transfer independently to improve ability to transfer in/out of her bath tub.             Baseline: unable            Goal status: INITIAL         PLAN:   PT FREQUENCY: 1-2x/week   PT DURATION: 8 weeks   PLANNED INTERVENTIONS: Therapeutic exercises, Therapeutic activity, Neuromuscular re-education, Balance training, Gait training, Patient/Family education, Self Care, Joint mobilization, Aquatic Therapy, Dry Needling, Cryotherapy, Moist heat, Ionotophoresis 4mg /ml Dexamethasone, Manual therapy, and Re-evaluation   PLAN FOR NEXT SESSION: review and progress HEP, foot intrinsics, ankle/hip strengthening, calf stretching, consider TPDN gastroc   Letitia Libra, PT, DPT, ATC 08/03/22 11:41 AM

## 2022-08-06 ENCOUNTER — Ambulatory Visit: Payer: Medicaid Other

## 2022-08-06 DIAGNOSIS — M25572 Pain in left ankle and joints of left foot: Secondary | ICD-10-CM | POA: Diagnosis not present

## 2022-08-06 DIAGNOSIS — R2689 Other abnormalities of gait and mobility: Secondary | ICD-10-CM

## 2022-08-06 DIAGNOSIS — M6281 Muscle weakness (generalized): Secondary | ICD-10-CM

## 2022-08-06 DIAGNOSIS — M25571 Pain in right ankle and joints of right foot: Secondary | ICD-10-CM

## 2022-08-06 NOTE — Therapy (Signed)
OUTPATIENT PHYSICAL THERAPY TREATMENT NOTE   Patient Name: Sheryl Cox MRN: [id number] DOB:28-Sep-1970, 52 y.o., female Today's Date: 08/06/2022  PCP: Renaye Rakers, MD   REFERRING PROVIDER: Edwin Cap, DPM  END OF SESSION:   PT End of Session - 08/06/22 0929     Visit Number 6    Number of Visits 17    Date for PT Re-Evaluation 09/08/22    Authorization Type [employer] MCD; 27 VL    PT Start Time 0930    PT Stop Time 1013    PT Time Calculation (min) 43 min    Activity Tolerance Patient tolerated treatment well    Behavior During Therapy WFL for tasks assessed/performed               Past Medical History:  Diagnosis Date   Anemia    history of blood transfusion-no abnormal reaction noted   Asthma    Albuterol as needed   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+, PR+.Takes Tamoxifen daily   Diabetes mellitus without complication (HCC)    takes Metformin daily   History of migraine    several yrs ago   Hypertension    takes Tribenzor daily   Past Surgical History:  Procedure Laterality Date   BIOPSY BREAST Left 12/05/12   fibroadenoma   BREAST LUMPECTOMY     2014   BREAST SURGERY Right    CESAREAN SECTION  01/05/2009   CHOLECYSTECTOMY N/A 01/31/2016   Procedure: LAPAROSCOPIC CHOLECYSTECTOMY;  Surgeon: Harriette Bouillon, MD;  Location: [facility];  Service: General;  Laterality: N/A;   DILITATION & CURRETTAGE/HYSTROSCOPY WITH HYDROTHERMAL ABLATION N/A 08/12/2013   Procedure: DILATATION & CURETTAGE/HYSTEROSCOPY WITH HYDROTHERMAL ABLATION;  Surgeon: Kathreen Cosier, MD;  Location: [facility];  Service: Gynecology;  Laterality: N/A;   ROBOTIC ASSISTED TOTAL HYSTERECTOMY N/A 06/18/2017   Procedure: XI ROBOTIC ASSISTED TOTAL HYSTERECTOMY;  Surgeon: Willodean Rosenthal, MD;  Location: [facility];  Service: Gynecology;  Laterality: N/A;   SALPINGOOPHORECTOMY Bilateral 06/18/2017   Procedure: BILATERAL SALPINGO OOPHORECTOMY;  Surgeon: Willodean Rosenthal, MD;  Location: [facility];  Service: Gynecology;  Laterality: Bilateral;   Patient Active Problem List   Diagnosis Date Noted   Iron deficiency anemia 05/29/2022   Osteoarthritis of acromioclavicular joint 02/13/2022   Neck pain 08/03/2020   Chronic pain of both knees 05/21/2018   Chronic right shoulder pain 05/21/2018   BMI 40.0-44.9, adult (HCC) 05/21/2018   Anemia 06/05/2013   Menorrhagia 12/30/2012   Hypertension 12/10/2012   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   Nabothian cyst 12/07/2011   Cervical mass 11/20/2011   Fibroid uterus 11/20/2011    REFERRING DIAG:  M76.72 (ICD-10-CM) - Peroneal tendinitis, left   THERAPY DIAG:  Pain in left ankle and joints of left foot  Pain in right ankle and joints of right foot  Muscle weakness (generalized)  Other abnormalities of gait and mobility  Rationale for Evaluation and Treatment Rehabilitation  PERTINENT HISTORY: Breast cancer Diabetes Hypertension   PRECAUTIONS: none  SUBJECTIVE:  SUBJECTIVE STATEMENT: "It's pain. I put some cream on it. It's the whole left side."    PAIN:  Are you having pain? Yes: NPRS scale: 9/10  Pain location: LLE (coursing from anterior thigh to medial foot) and lateral Lt foot/ankle  Pain description: ache Aggravating factors: walking, standing, "wrong shoes" Relieving factors: soaking,rest    OBJECTIVE: (objective measures completed at initial evaluation unless otherwise dated)   DIAGNOSTIC FINDINGS:  Multiple views x-ray of both feet: no fracture, dislocation, swelling or degenerative changes noted, plantar calcaneal spur, and pes planus   PATIENT SURVEYS:  LEFS 25/80   COGNITION: Overall cognitive status: Within functional limits for tasks assessed                         SENSATION: WFL   EDEMA:   Mild swelling about Lt lateral ankle      POSTURE: Pes planus in standing   PALPATION: TTP bilateral plantar fascia, Lt peroneal tendons    LOWER EXTREMITY ROM:   Active ROM Right eval Left eval Left  07/26/22 08/03/22 Left   Hip flexion        Hip extension        Hip abduction        Hip adduction        Hip internal rotation        Hip external rotation        Knee flexion        Knee extension        Ankle dorsiflexion 2 Lacking 4 2 4   Ankle plantarflexion WNL WNL    Ankle inversion WNL WNL    Ankle eversion WNL WNL     (Blank rows = not tested)   LOWER EXTREMITY MMT:   MMT Right eval Left eval  Hip flexion 5 5  Hip extension      Hip abduction 3+ 3+  Hip adduction      Hip internal rotation      Hip external rotation      Knee flexion      Knee extension      Ankle dorsiflexion 5 4  Ankle plantarflexion SL partial calf raise SL partial calf raise   Ankle inversion 4+ 4+  Ankle eversion 5 4+   (Blank rows = not tested) *Pain with all ankle MMT bilaterally*   LOWER EXTREMITY SPECIAL TESTS:  Windlass (-)    FUNCTIONAL TESTS:  SLS 3 second bilateral    GAIT: Distance walked: 10 ft  Assistive device utilized: None Level of assistance: Complete Independence Comments: foot flat initial contact, WBOS, excessive pronation, limited push-off  [facility] Adult PT Treatment:                                                DATE: 08/06/22 Therapeutic Exercise: Calf stretch on wedge x 1 minute  Seated ankle rockerboard 2 x 10  Seated marble pickups 2 x 15 Toe yoga 2 x 10  Towel slides x 5 each   Neuromuscular re-ed: Romberg on foam x 30 sec Romberg on foam eyes closed 2 x 30 sec  Semi-tandem on foam x 30 sec each    [facility] Adult PT Treatment:  DATE: 08/03/22 Therapeutic Exercise: NuStep level 5 LE only x 5 minutes  Calf stretch on wedge x 1 minute  Hip bridge 2 x 10  SLR 2 x 10  Sidelying hip abduction 2 x 10   Resisted ankle PF black band 2 x 10  Updated HEP  Manual Therapy: IASTM Lt peroneals, gastroc, and plantar fascia    [facility] Adult PT Treatment:                                                DATE: 07/26/22 Therapeutic Exercise: Seated towel scrunch  Seated towel scrunches  Seated Calf stretch with strap  Seated green band PF Left hamstring stretch with strap  Left hip abduction x 10 Standing gastroc stretch 2 x 30 sec each  Standing rocker board Tandem stance trials Seated heel raise 5# 15 x 2  Modalities: Cold pack to left ankle x 10 minutes      PATIENT EDUCATION:  Education details: see treatment Person educated: Patient Education method: Explanation, Demonstration, Tactile cues, Verbal cues, and Handouts Education comprehension: verbalized understanding, returned demonstration, verbal cues required, tactile cues required, and needs further education   HOME EXERCISE PROGRAM: Access Code: [email or web address] URL: [email or web address]     ASSESSMENT:   CLINICAL IMPRESSION: Patient arrives with high pain levels about the entire LLE describing more sciatica symptoms as pain is coursing from anterior thigh to lower leg and medial foot as well as ongoing Lt lateral ankle/foot pain. She declined TPDN to the Lt foot/ankle today due to her pain levels. Focused on ankle and intrinsic foot strengthening and introduced static balance activity on unstable surface with good tolerance. She demonstrates fair postural control with static balance activity requiring frequent use of reaching strategy. No reports of increased pain throughout session.    OBJECTIVE IMPAIRMENTS: Abnormal gait, decreased activity tolerance, decreased balance, decreased knowledge of condition, difficulty walking, decreased ROM, decreased strength, increased fascial restrictions, improper body mechanics, postural dysfunction, and pain.    ACTIVITY LIMITATIONS: carrying, lifting, bending, standing, squatting,  stairs, transfers, and locomotion level   PARTICIPATION LIMITATIONS: meal prep, cleaning, laundry, shopping, community activity, and occupation   PERSONAL FACTORS: Age, Fitness, Profession, Time since onset of injury/illness/exacerbation, and 3+ comorbidities: see PMH above  are also affecting patient's functional outcome.    REHAB POTENTIAL: Fair chronicity of injury   CLINICAL DECISION MAKING: Stable/uncomplicated   EVALUATION COMPLEXITY: Low     GOALS: Goals reviewed with patient? Yes   SHORT TERM GOALS: Target date: 08/06/2022     Patient will be independent and compliant with initial HEP.    Baseline: issued at eval  Goal status: met   2.  Patient will improve bilateral ankle DF AROM by at least 5 degrees to improve gait mechanics.  Baseline: see above  07/26/22: +2 Goal status: ONGOING   3.  Patient will be able to perform full range SL calf raise to improve push-off during gait cycle.  Baseline: see above  07/26/22: Min lift bilateral heel raise Goal status: ONGOING     LONG TERM GOALS: Target date: 09/08/2022     Patient will maintain SLS for at least 8 seconds to improve gait stability.  Baseline: see above  Goal status: INITIAL   2.  Patient will demonstrate pain free 5/5 bilateral ankle strength to improve stability when navigating on uneven terrain.  Baseline: see  above  Goal status: INITIAL   3.  Patient will demonstrate at least 4/5 bilateral hip abductor strength to improve stability about the chain with prolonged walking/standing activity.  Baseline: see above  Goal status: INITIAL   4.  Patient will score at least 40/80 on the LEFS to signify clinically meaningful improvement in functional abilities.  Baseline: see above  Goal status: INITIAL   5.  Patient will report pain at worst rated as </= 5/10 to reduce her current functional limitations.  Baseline: see above  Goal status: INITIAL   6. Patient will complete floor transfer independently to  improve ability to transfer in/out of her bath tub.             Baseline: unable            Goal status: INITIAL         PLAN:   PT FREQUENCY: 1-2x/week   PT DURATION: 8 weeks   PLANNED INTERVENTIONS: Therapeutic exercises, Therapeutic activity, Neuromuscular re-education, Balance training, Gait training, Patient/Family education, Self Care, Joint mobilization, Aquatic Therapy, Dry Needling, Cryotherapy, Moist heat, Ionotophoresis 4mg /ml Dexamethasone, Manual therapy, and Re-evaluation   PLAN FOR NEXT SESSION: review and progress HEP, foot intrinsics, ankle/hip strengthening, calf stretching, consider TPDN gastroc   Letitia Libra, PT, DPT, ATC 08/06/22 10:16 AM

## 2022-08-09 ENCOUNTER — Ambulatory Visit: Payer: Medicaid Other | Admitting: Physical Therapy

## 2022-08-09 ENCOUNTER — Encounter: Payer: Self-pay | Admitting: Physical Therapy

## 2022-08-09 DIAGNOSIS — M25572 Pain in left ankle and joints of left foot: Secondary | ICD-10-CM

## 2022-08-09 DIAGNOSIS — M25571 Pain in right ankle and joints of right foot: Secondary | ICD-10-CM

## 2022-08-09 DIAGNOSIS — R2689 Other abnormalities of gait and mobility: Secondary | ICD-10-CM

## 2022-08-09 DIAGNOSIS — M6281 Muscle weakness (generalized): Secondary | ICD-10-CM

## 2022-08-09 NOTE — Therapy (Signed)
OUTPATIENT PHYSICAL THERAPY TREATMENT NOTE   Patient Name: Sheryl Cox MRN: [id number] DOB:November 05, 1970, 52 y.o., female Today's Date: 08/09/2022  PCP: Renaye Rakers, MD   REFERRING PROVIDER: Edwin Cap, DPM  END OF SESSION:   PT End of Session - 08/09/22 1359     Visit Number 7    Number of Visits 17    Date for PT Re-Evaluation 09/08/22    Authorization Type [employer] MCD; 27 VL    PT Start Time 1400    PT Stop Time 1442    PT Time Calculation (min) 42 min    Activity Tolerance Patient tolerated treatment well    Behavior During Therapy WFL for tasks assessed/performed                Past Medical History:  Diagnosis Date   Anemia    history of blood transfusion-no abnormal reaction noted   Asthma    Albuterol as needed   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+, PR+.Takes Tamoxifen daily   Diabetes mellitus without complication (HCC)    takes Metformin daily   History of migraine    several yrs ago   Hypertension    takes Tribenzor daily   Past Surgical History:  Procedure Laterality Date   BIOPSY BREAST Left 12/05/12   fibroadenoma   BREAST LUMPECTOMY     2014   BREAST SURGERY Right    CESAREAN SECTION  01/05/2009   CHOLECYSTECTOMY N/A 01/31/2016   Procedure: LAPAROSCOPIC CHOLECYSTECTOMY;  Surgeon: Harriette Bouillon, MD;  Location: [facility];  Service: General;  Laterality: N/A;   DILITATION & CURRETTAGE/HYSTROSCOPY WITH HYDROTHERMAL ABLATION N/A 08/12/2013   Procedure: DILATATION & CURETTAGE/HYSTEROSCOPY WITH HYDROTHERMAL ABLATION;  Surgeon: Kathreen Cosier, MD;  Location: [facility];  Service: Gynecology;  Laterality: N/A;   ROBOTIC ASSISTED TOTAL HYSTERECTOMY N/A 06/18/2017   Procedure: XI ROBOTIC ASSISTED TOTAL HYSTERECTOMY;  Surgeon: Willodean Rosenthal, MD;  Location: [facility];  Service: Gynecology;  Laterality: N/A;   SALPINGOOPHORECTOMY Bilateral 06/18/2017   Procedure: BILATERAL SALPINGO OOPHORECTOMY;  Surgeon: Willodean Rosenthal, MD;  Location: [facility];  Service: Gynecology;  Laterality: Bilateral;   Patient Active Problem List   Diagnosis Date Noted   Iron deficiency anemia 05/29/2022   Osteoarthritis of acromioclavicular joint 02/13/2022   Neck pain 08/03/2020   Chronic pain of both knees 05/21/2018   Chronic right shoulder pain 05/21/2018   BMI 40.0-44.9, adult (HCC) 05/21/2018   Anemia 06/05/2013   Menorrhagia 12/30/2012   Hypertension 12/10/2012   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   Nabothian cyst 12/07/2011   Cervical mass 11/20/2011   Fibroid uterus 11/20/2011    REFERRING DIAG:  M76.72 (ICD-10-CM) - Peroneal tendinitis, left   THERAPY DIAG:  Pain in left ankle and joints of left foot  Pain in right ankle and joints of right foot  Muscle weakness (generalized)  Other abnormalities of gait and mobility  Rationale for Evaluation and Treatment Rehabilitation  PERTINENT HISTORY: Breast cancer Diabetes Hypertension   PRECAUTIONS: none  SUBJECTIVE:  SUBJECTIVE STATEMENT:  Pt reports continued high levels of pain.   PAIN:  Are you having pain? Yes: NPRS scale: 9/10  Pain location: LLE (coursing from anterior thigh to medial foot) and lateral Lt foot/ankle  Pain description: ache Aggravating factors: walking, standing, "wrong shoes" Relieving factors: soaking,rest    OBJECTIVE: (objective measures completed at initial evaluation unless otherwise dated)   DIAGNOSTIC FINDINGS:  Multiple views x-ray of both feet: no fracture, dislocation, swelling or degenerative changes noted, plantar calcaneal spur, and pes planus   PATIENT SURVEYS:  LEFS 25/80   COGNITION: Overall cognitive status: Within functional limits for tasks assessed                         SENSATION: WFL   EDEMA:  Mild swelling  about Lt lateral ankle      POSTURE: Pes planus in standing   PALPATION: TTP bilateral plantar fascia, Lt peroneal tendons    LOWER EXTREMITY ROM:   Active ROM Right eval Left eval Left  07/26/22 08/03/22 Left   Hip flexion        Hip extension        Hip abduction        Hip adduction        Hip internal rotation        Hip external rotation        Knee flexion        Knee extension        Ankle dorsiflexion 2 Lacking 4 2 4   Ankle plantarflexion WNL WNL    Ankle inversion WNL WNL    Ankle eversion WNL WNL     (Blank rows = not tested)   LOWER EXTREMITY MMT:   MMT Right eval Left eval  Hip flexion 5 5  Hip extension      Hip abduction 3+ 3+  Hip adduction      Hip internal rotation      Hip external rotation      Knee flexion      Knee extension      Ankle dorsiflexion 5 4  Ankle plantarflexion SL partial calf raise SL partial calf raise   Ankle inversion 4+ 4+  Ankle eversion 5 4+   (Blank rows = not tested) *Pain with all ankle MMT bilaterally*   LOWER EXTREMITY SPECIAL TESTS:  Windlass (-)    FUNCTIONAL TESTS:  SLS 3 second bilateral    GAIT: Distance walked: 10 ft  Assistive device utilized: None Level of assistance: Complete Independence Comments: foot flat initial contact, WBOS, excessive pronation, limited push-off   DATE: 08/08/2021 Aquatic therapy at [facility]- [address] - therapeutic pool temp 92 degrees Pt enters building ambulating independently  Treatment took place in water 3.8 to  4 ft 8 in.feet deep depending upon activity.  Pt entered and exited the pool via stair and handrails independently  Patient entered water for aquatic therapy for first time and was introduced to principles and therapeutic effects of water as they ambulated and acclimated to pool.   Therapeutic Exercise: Walking forward/backwards/side stepping x 2 laps each Lateral cross over walking Walking march - 2 laps Tandem walking - 2 laps Walking march with  plantar flexion - 2 laps Jogging on toes - 2 laps Heel raise - 20x S/L heel raise - 2x20 Alternating hip abd - 2x20 ea Squats - 3x10 Calf stretch on wall of pool Step up 2x10 ea fwd and lat   Pt requires  the buoyancy of water for active assisted exercises with buoyancy supported for strengthening and AROM exercises. Hydrostatic pressure also supports joints by unweighting joint load by at least 50 % in 3-4 feet depth water. 80% in chest to neck deep water. Water will provide assistance with movement using the current and laminar flow while the buoyancy reduces weight bearing. Pt requires the viscosity of the water for resistance with strengthening exercises.  [facility] Adult PT Treatment:                                                DATE: 08/06/22 Therapeutic Exercise: Calf stretch on wedge x 1 minute  Seated ankle rockerboard 2 x 10  Seated marble pickups 2 x 15 Toe yoga 2 x 10  Towel slides x 5 each   Neuromuscular re-ed: Romberg on foam x 30 sec Romberg on foam eyes closed 2 x 30 sec  Semi-tandem on foam x 30 sec each    [facility] Adult PT Treatment:                                                DATE: 08/03/22 Therapeutic Exercise: NuStep level 5 LE only x 5 minutes  Calf stretch on wedge x 1 minute  Hip bridge 2 x 10  SLR 2 x 10  Sidelying hip abduction 2 x 10  Resisted ankle PF black band 2 x 10  Updated HEP  Manual Therapy: IASTM Lt peroneals, gastroc, and plantar fascia    [facility] Adult PT Treatment:                                                DATE: 07/26/22 Therapeutic Exercise: Seated towel scrunch  Seated towel scrunches  Seated Calf stretch with strap  Seated green band PF Left hamstring stretch with strap  Left hip abduction x 10 Standing gastroc stretch 2 x 30 sec each  Standing rocker board Tandem stance trials Seated heel raise 5# 15 x 2  Modalities: Cold pack to left ankle x 10 minutes      PATIENT EDUCATION:  Education details: see treatment Person  educated: Patient Education method: Explanation, Demonstration, Tactile cues, Verbal cues, and Handouts Education comprehension: verbalized understanding, returned demonstration, verbal cues required, tactile cues required, and needs further education   HOME EXERCISE PROGRAM: Access Code: [email or web address] URL: [email or web address]     ASSESSMENT:   CLINICAL IMPRESSION: Session today focused on ankle and general LE strengtehning in the aquatic environment for use of buoyancy to offload joints and the viscosity of water as resistance during therapeutic exercise.  Emphasized positions to load ankle and G/S complex including addition of jogging on toes for dynamic load.  Patient was able to tolerate all prescribed exercises in the aquatic environment with no adverse effects and reports 7/10 pain at the end of the session. Patient continues to benefit from skilled PT services on land and aquatic based and should be progressed as able to improve functional independence.     OBJECTIVE IMPAIRMENTS: Abnormal gait, decreased activity tolerance, decreased balance, decreased knowledge of condition, difficulty walking, decreased ROM,  decreased strength, increased fascial restrictions, improper body mechanics, postural dysfunction, and pain.    ACTIVITY LIMITATIONS: carrying, lifting, bending, standing, squatting, stairs, transfers, and locomotion level   PARTICIPATION LIMITATIONS: meal prep, cleaning, laundry, shopping, community activity, and occupation   PERSONAL FACTORS: Age, Fitness, Profession, Time since onset of injury/illness/exacerbation, and 3+ comorbidities: see PMH above  are also affecting patient's functional outcome.    REHAB POTENTIAL: Fair chronicity of injury   CLINICAL DECISION MAKING: Stable/uncomplicated   EVALUATION COMPLEXITY: Low     GOALS: Goals reviewed with patient? Yes   SHORT TERM GOALS: Target date: 08/06/2022     Patient will be independent and compliant  with initial HEP.    Baseline: issued at eval  Goal status: met   2.  Patient will improve bilateral ankle DF AROM by at least 5 degrees to improve gait mechanics.  Baseline: see above  07/26/22: +2 Goal status: ONGOING   3.  Patient will be able to perform full range SL calf raise to improve push-off during gait cycle.  Baseline: see above  07/26/22: Min lift bilateral heel raise Goal status: ONGOING     LONG TERM GOALS: Target date: 09/08/2022     Patient will maintain SLS for at least 8 seconds to improve gait stability.  Baseline: see above  Goal status: INITIAL   2.  Patient will demonstrate pain free 5/5 bilateral ankle strength to improve stability when navigating on uneven terrain.  Baseline: see above  Goal status: INITIAL   3.  Patient will demonstrate at least 4/5 bilateral hip abductor strength to improve stability about the chain with prolonged walking/standing activity.  Baseline: see above  Goal status: INITIAL   4.  Patient will score at least 40/80 on the LEFS to signify clinically meaningful improvement in functional abilities.  Baseline: see above  Goal status: INITIAL   5.  Patient will report pain at worst rated as </= 5/10 to reduce her current functional limitations.  Baseline: see above  Goal status: INITIAL   6. Patient will complete floor transfer independently to improve ability to transfer in/out of her bath tub.             Baseline: unable            Goal status: INITIAL         PLAN:   PT FREQUENCY: 1-2x/week   PT DURATION: 8 weeks   PLANNED INTERVENTIONS: Therapeutic exercises, Therapeutic activity, Neuromuscular re-education, Balance training, Gait training, Patient/Family education, Self Care, Joint mobilization, Aquatic Therapy, Dry Needling, Cryotherapy, Moist heat, Ionotophoresis 4mg /ml Dexamethasone, Manual therapy, and Re-evaluation   PLAN FOR NEXT SESSION: review and progress HEP, foot intrinsics, ankle/hip strengthening, calf  stretching, consider TPDN gastroc   Kimberlee Nearing Angad Nabers PT 08/09/22 2:47 PM

## 2022-08-14 NOTE — Therapy (Signed)
OUTPATIENT PHYSICAL THERAPY TREATMENT NOTE   Patient Name: Sheryl Cox MRN: [id number] DOB:1971-02-18, 52 y.o., female Today's Date: 08/15/2022  PCP: Renaye Rakers, MD   REFERRING PROVIDER: Edwin Cap, DPM  END OF SESSION:   PT End of Session - 08/15/22 0847     Visit Number 8    Number of Visits 17    Date for PT Re-Evaluation 09/08/22    Authorization Type [employer] MCD; 27 VL    PT Start Time 0847    PT Stop Time 0930    PT Time Calculation (min) 43 min    Activity Tolerance Patient tolerated treatment well    Behavior During Therapy WFL for tasks assessed/performed                 Past Medical History:  Diagnosis Date   Anemia    history of blood transfusion-no abnormal reaction noted   Asthma    Albuterol as needed   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+, PR+.Takes Tamoxifen daily   Diabetes mellitus without complication (HCC)    takes Metformin daily   History of migraine    several yrs ago   Hypertension    takes Tribenzor daily   Past Surgical History:  Procedure Laterality Date   BIOPSY BREAST Left 12/05/12   fibroadenoma   BREAST LUMPECTOMY     2014   BREAST SURGERY Right    CESAREAN SECTION  01/05/2009   CHOLECYSTECTOMY N/A 01/31/2016   Procedure: LAPAROSCOPIC CHOLECYSTECTOMY;  Surgeon: Harriette Bouillon, MD;  Location: [facility];  Service: General;  Laterality: N/A;   DILITATION & CURRETTAGE/HYSTROSCOPY WITH HYDROTHERMAL ABLATION N/A 08/12/2013   Procedure: DILATATION & CURETTAGE/HYSTEROSCOPY WITH HYDROTHERMAL ABLATION;  Surgeon: Kathreen Cosier, MD;  Location: [facility];  Service: Gynecology;  Laterality: N/A;   ROBOTIC ASSISTED TOTAL HYSTERECTOMY N/A 06/18/2017   Procedure: XI ROBOTIC ASSISTED TOTAL HYSTERECTOMY;  Surgeon: Willodean Rosenthal, MD;  Location: [facility];  Service: Gynecology;  Laterality: N/A;   SALPINGOOPHORECTOMY Bilateral 06/18/2017   Procedure: BILATERAL SALPINGO OOPHORECTOMY;  Surgeon: Willodean Rosenthal, MD;  Location:  [facility];  Service: Gynecology;  Laterality: Bilateral;   Patient Active Problem List   Diagnosis Date Noted   Iron deficiency anemia 05/29/2022   Osteoarthritis of acromioclavicular joint 02/13/2022   Neck pain 08/03/2020   Chronic pain of both knees 05/21/2018   Chronic right shoulder pain 05/21/2018   BMI 40.0-44.9, adult (HCC) 05/21/2018   Anemia 06/05/2013   Menorrhagia 12/30/2012   Hypertension 12/10/2012   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   Nabothian cyst 12/07/2011   Cervical mass 11/20/2011   Fibroid uterus 11/20/2011    REFERRING DIAG:  M76.72 (ICD-10-CM) - Peroneal tendinitis, left   THERAPY DIAG:  Pain in left ankle and joints of left foot  Pain in right ankle and joints of right foot  Muscle weakness (generalized)  Other abnormalities of gait and mobility  Rationale for Evaluation and Treatment Rehabilitation  PERTINENT HISTORY: Breast cancer Diabetes Hypertension   PRECAUTIONS: none  SUBJECTIVE:  SUBJECTIVE STATEMENT:  "It's getting there. It's about a 7.    PAIN:  Are you having pain? Yes: NPRS scale: 7/10  Pain location: lateral Lt foot/ankle  Pain description: ache Aggravating factors: walking, standing, "wrong shoes" Relieving factors: soaking,rest    OBJECTIVE: (objective measures completed at initial evaluation unless otherwise dated)   DIAGNOSTIC FINDINGS:  Multiple views x-ray of both feet: no fracture, dislocation, swelling or degenerative changes noted, plantar calcaneal spur, and pes planus   PATIENT SURVEYS:  LEFS 25/80   COGNITION: Overall cognitive status: Within functional limits for tasks assessed                         SENSATION: WFL   EDEMA:  Mild swelling about Lt lateral ankle      POSTURE: Pes planus in  standing   PALPATION: TTP bilateral plantar fascia, Lt peroneal tendons    LOWER EXTREMITY ROM:   Active ROM Right eval Left eval Left  07/26/22 08/03/22 Left  08/15/22 Left   Hip flexion         Hip extension         Hip abduction         Hip adduction         Hip internal rotation         Hip external rotation         Knee flexion         Knee extension         Ankle dorsiflexion 2 Lacking 4 2 4 3   Ankle plantarflexion WNL WNL     Ankle inversion WNL WNL     Ankle eversion WNL WNL      (Blank rows = not tested)   LOWER EXTREMITY MMT:   MMT Right eval Left eval 08/15/22 Left   Hip flexion 5 5   Hip extension       Hip abduction 3+ 3+   Hip adduction       Hip internal rotation       Hip external rotation       Knee flexion       Knee extension       Ankle dorsiflexion 5 4   Ankle plantarflexion SL partial calf raise SL partial calf raise  SL partial calf raise   Ankle inversion 4+ 4+   Ankle eversion 5 4+    (Blank rows = not tested) *Pain with all ankle MMT bilaterally*   LOWER EXTREMITY SPECIAL TESTS:  Windlass (-)    FUNCTIONAL TESTS:  SLS 3 second bilateral    GAIT: Distance walked: 10 ft  Assistive device utilized: None Level of assistance: Complete Independence Comments: foot flat initial contact, WBOS, excessive pronation, limited push-off  [facility] Adult PT Treatment:                                                DATE: 08/15/22 Therapeutic Exercise: Calf stretch on wedge x 1 minute  Eccentric calf raise 2 x 10  Standing 3 way hip 2 x 10  Forward step overs 2 x 10   Iontophoresis  1 mL dex 4 hours, slow release patch, Lt lateral ankle    DATE: 08/08/2021 Aquatic therapy at [facility]- [address] - therapeutic pool temp 92 degrees Pt enters building ambulating independently  Treatment took place  in water 3.8 to  4 ft 8 in.feet deep depending upon activity.  Pt entered and exited the pool via stair and handrails independently  Patient  entered water for aquatic therapy for first time and was introduced to principles and therapeutic effects of water as they ambulated and acclimated to pool.   Therapeutic Exercise: Walking forward/backwards/side stepping x 2 laps each Lateral cross over walking Walking march - 2 laps Tandem walking - 2 laps Walking march with plantar flexion - 2 laps Jogging on toes - 2 laps Heel raise - 20x S/L heel raise - 2x20 Alternating hip abd - 2x20 ea Squats - 3x10 Calf stretch on wall of pool Step up 2x10 ea fwd and lat   Pt requires the buoyancy of water for active assisted exercises with buoyancy supported for strengthening and AROM exercises. Hydrostatic pressure also supports joints by unweighting joint load by at least 50 % in 3-4 feet depth water. 80% in chest to neck deep water. Water will provide assistance with movement using the current and laminar flow while the buoyancy reduces weight bearing. Pt requires the viscosity of the water for resistance with strengthening exercises.  [facility] Adult PT Treatment:                                                DATE: 08/06/22 Therapeutic Exercise: Calf stretch on wedge x 1 minute  Seated ankle rockerboard 2 x 10  Seated marble pickups 2 x 15 Toe yoga 2 x 10  Towel slides x 5 each   Neuromuscular re-ed: Romberg on foam x 30 sec Romberg on foam eyes closed 2 x 30 sec  Semi-tandem on foam x 30 sec each    [facility] Adult PT Treatment:                                                DATE: 08/03/22 Therapeutic Exercise: NuStep level 5 LE only x 5 minutes  Calf stretch on wedge x 1 minute  Hip bridge 2 x 10  SLR 2 x 10  Sidelying hip abduction 2 x 10  Resisted ankle PF black band 2 x 10  Updated HEP  Manual Therapy: IASTM Lt peroneals, gastroc, and plantar fascia    PATIENT EDUCATION:  Education details: ionto  Person educated: Patient Education method: Explanation, handout Education comprehension: verbalized understanding   HOME EXERCISE  PROGRAM: Access Code: [email or web address] URL: [email or web address]     ASSESSMENT:   CLINICAL IMPRESSION: Patient arrives with moderate Lt ankle pain. Continued with progression of ankle and hip strengthening with good tolerance. She continues to be unable to complete full range SL calf raise secondary to weakness. She has difficulty maintaining lumbopelvic stability with SL activity with lateral trunk lean present. Iontophoresis patch was utilized to Lt lateral ankle to assist in pain control with patient instructed on wear-time and removal.     OBJECTIVE IMPAIRMENTS: Abnormal gait, decreased activity tolerance, decreased balance, decreased knowledge of condition, difficulty walking, decreased ROM, decreased strength, increased fascial restrictions, improper body mechanics, postural dysfunction, and pain.    ACTIVITY LIMITATIONS: carrying, lifting, bending, standing, squatting, stairs, transfers, and locomotion level   PARTICIPATION LIMITATIONS: meal prep, cleaning, laundry, shopping, community activity, and occupation   PERSONAL  FACTORS: Age, Fitness, Profession, Time since onset of injury/illness/exacerbation, and 3+ comorbidities: see PMH above  are also affecting patient's functional outcome.    REHAB POTENTIAL: Fair chronicity of injury   CLINICAL DECISION MAKING: Stable/uncomplicated   EVALUATION COMPLEXITY: Low     GOALS: Goals reviewed with patient? Yes   SHORT TERM GOALS: Target date: 08/06/2022     Patient will be independent and compliant with initial HEP.    Baseline: issued at eval  Goal status: met   2.  Patient will improve bilateral ankle DF AROM by at least 5 degrees to improve gait mechanics.  Baseline: see above  07/26/22: +2 Goal status: ONGOING   3.  Patient will be able to perform full range SL calf raise to improve push-off during gait cycle.  Baseline: see above  07/26/22: Min lift bilateral heel raise 08/15/22: partial range SL calf raise Goal  status: ONGOING     LONG TERM GOALS: Target date: 09/08/2022     Patient will maintain SLS for at least 8 seconds to improve gait stability.  Baseline: see above  Goal status: INITIAL   2.  Patient will demonstrate pain free 5/5 bilateral ankle strength to improve stability when navigating on uneven terrain.  Baseline: see above  Goal status: INITIAL   3.  Patient will demonstrate at least 4/5 bilateral hip abductor strength to improve stability about the chain with prolonged walking/standing activity.  Baseline: see above  Goal status: INITIAL   4.  Patient will score at least 40/80 on the LEFS to signify clinically meaningful improvement in functional abilities.  Baseline: see above  Goal status: INITIAL   5.  Patient will report pain at worst rated as </= 5/10 to reduce her current functional limitations.  Baseline: see above  Goal status: INITIAL   6. Patient will complete floor transfer independently to improve ability to transfer in/out of her bath tub.             Baseline: unable            Goal status: INITIAL         PLAN:   PT FREQUENCY: 1-2x/week   PT DURATION: 8 weeks   PLANNED INTERVENTIONS: Therapeutic exercises, Therapeutic activity, Neuromuscular re-education, Balance training, Gait training, Patient/Family education, Self Care, Joint mobilization, Aquatic Therapy, Dry Needling, Cryotherapy, Moist heat, Ionotophoresis 4mg /ml Dexamethasone, Manual therapy, and Re-evaluation   PLAN FOR NEXT SESSION: review and progress HEP, foot intrinsics, ankle/hip strengthening, calf stretching, ionto response  Letitia Libra, PT, DPT, ATC 08/15/22 9:32 AM

## 2022-08-15 ENCOUNTER — Ambulatory Visit: Payer: Medicaid Other

## 2022-08-15 DIAGNOSIS — M25572 Pain in left ankle and joints of left foot: Secondary | ICD-10-CM

## 2022-08-15 DIAGNOSIS — M6281 Muscle weakness (generalized): Secondary | ICD-10-CM

## 2022-08-15 DIAGNOSIS — R2689 Other abnormalities of gait and mobility: Secondary | ICD-10-CM

## 2022-08-15 DIAGNOSIS — M25571 Pain in right ankle and joints of right foot: Secondary | ICD-10-CM

## 2022-08-15 NOTE — Patient Instructions (Signed)

## 2022-08-16 ENCOUNTER — Encounter: Payer: Self-pay | Admitting: Physical Therapy

## 2022-08-16 ENCOUNTER — Ambulatory Visit: Payer: Medicaid Other | Admitting: Physical Therapy

## 2022-08-16 DIAGNOSIS — M25572 Pain in left ankle and joints of left foot: Secondary | ICD-10-CM | POA: Diagnosis not present

## 2022-08-16 DIAGNOSIS — M25571 Pain in right ankle and joints of right foot: Secondary | ICD-10-CM

## 2022-08-16 DIAGNOSIS — R2689 Other abnormalities of gait and mobility: Secondary | ICD-10-CM

## 2022-08-16 DIAGNOSIS — M6281 Muscle weakness (generalized): Secondary | ICD-10-CM

## 2022-08-16 NOTE — Congregational Nurse Program (Signed)
Member at [facility] today with her daughter.  She is trying to find summer employment as out for summer ([employer]).  Discussed PCA work and job Psychologist, occupational is going to send her info for an employer.  -------Juliann Pulse, RN

## 2022-08-16 NOTE — Therapy (Signed)
OUTPATIENT PHYSICAL THERAPY TREATMENT NOTE   Patient Name: Sheryl Cox MRN: [id number] DOB:1970/10/13, 52 y.o., female Today's Date: 08/17/2022  PCP: Renaye Rakers, MD   REFERRING PROVIDER: Edwin Cap, DPM  END OF SESSION:   PT End of Session - 08/16/22 1446     Visit Number 9    Number of Visits 17    Date for PT Re-Evaluation 09/08/22    Authorization Type [employer] MCD; 27 VL    PT Start Time 1445    PT Stop Time 1526    PT Time Calculation (min) 41 min    Activity Tolerance Patient tolerated treatment well    Behavior During Therapy WFL for tasks assessed/performed                Past Medical History:  Diagnosis Date   Anemia    history of blood transfusion-no abnormal reaction noted   Asthma    Albuterol as needed   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+, PR+.Takes Tamoxifen daily   Diabetes mellitus without complication (HCC)    takes Metformin daily   History of migraine    several yrs ago   Hypertension    takes Tribenzor daily   Past Surgical History:  Procedure Laterality Date   BIOPSY BREAST Left 12/05/12   fibroadenoma   BREAST LUMPECTOMY     2014   BREAST SURGERY Right    CESAREAN SECTION  01/05/2009   CHOLECYSTECTOMY N/A 01/31/2016   Procedure: LAPAROSCOPIC CHOLECYSTECTOMY;  Surgeon: Harriette Bouillon, MD;  Location: [facility] OR;  Service: General;  Laterality: N/A;   DILITATION & CURRETTAGE/HYSTROSCOPY WITH HYDROTHERMAL ABLATION N/A 08/12/2013   Procedure: DILATATION & CURETTAGE/HYSTEROSCOPY WITH HYDROTHERMAL ABLATION;  Surgeon: Kathreen Cosier, MD;  Location: [facility];  Service: Gynecology;  Laterality: N/A;   ROBOTIC ASSISTED TOTAL HYSTERECTOMY N/A 06/18/2017   Procedure: XI ROBOTIC ASSISTED TOTAL HYSTERECTOMY;  Surgeon: Willodean Rosenthal, MD;  Location: [facility];  Service: Gynecology;  Laterality: N/A;   SALPINGOOPHORECTOMY Bilateral 06/18/2017   Procedure: BILATERAL SALPINGO OOPHORECTOMY;  Surgeon: Willodean Rosenthal, MD;  Location: [facility];  Service: Gynecology;  Laterality: Bilateral;   Patient Active Problem List   Diagnosis Date Noted   Iron deficiency anemia 05/29/2022   Osteoarthritis of acromioclavicular joint 02/13/2022   Neck pain 08/03/2020   Chronic pain of both knees 05/21/2018   Chronic right shoulder pain 05/21/2018   BMI 40.0-44.9, adult (HCC) 05/21/2018   Anemia 06/05/2013   Menorrhagia 12/30/2012   Hypertension 12/10/2012   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   Nabothian cyst 12/07/2011   Cervical mass 11/20/2011   Fibroid uterus 11/20/2011    REFERRING DIAG:  M76.72 (ICD-10-CM) - Peroneal tendinitis, left   THERAPY DIAG:  Pain in left ankle and joints of left foot  Pain in right ankle and joints of right foot  Muscle weakness (generalized)  Other abnormalities of gait and mobility  Rationale for Evaluation and Treatment Rehabilitation  PERTINENT HISTORY: Breast cancer Diabetes Hypertension   PRECAUTIONS: none  SUBJECTIVE:  SUBJECTIVE STATEMENT:  Pt reports that her foot is very sore today.   PAIN:  Are you having pain? Yes: NPRS scale: 9/10  Pain location: LLE (coursing from anterior thigh to medial foot) and lateral Lt foot/ankle  Pain description: ache Aggravating factors: walking, standing, "wrong shoes" Relieving factors: soaking,rest    OBJECTIVE: (objective measures completed at initial evaluation unless otherwise dated)   DIAGNOSTIC FINDINGS:  Multiple views x-ray of both feet: no fracture, dislocation, swelling or degenerative changes noted, plantar calcaneal spur, and pes planus   PATIENT SURVEYS:  LEFS 25/80   COGNITION: Overall cognitive status: Within functional limits for tasks assessed                         SENSATION: WFL   EDEMA:  Mild swelling  about Lt lateral ankle      POSTURE: Pes planus in standing   PALPATION: TTP bilateral plantar fascia, Lt peroneal tendons    LOWER EXTREMITY ROM:   Active ROM Right eval Left eval Left  07/26/22 08/03/22 Left   Hip flexion        Hip extension        Hip abduction        Hip adduction        Hip internal rotation        Hip external rotation        Knee flexion        Knee extension        Ankle dorsiflexion 2 Lacking 4 2 4   Ankle plantarflexion WNL WNL    Ankle inversion WNL WNL    Ankle eversion WNL WNL     (Blank rows = not tested)   LOWER EXTREMITY MMT:   MMT Right eval Left eval  Hip flexion 5 5  Hip extension      Hip abduction 3+ 3+  Hip adduction      Hip internal rotation      Hip external rotation      Knee flexion      Knee extension      Ankle dorsiflexion 5 4  Ankle plantarflexion SL partial calf raise SL partial calf raise   Ankle inversion 4+ 4+  Ankle eversion 5 4+   (Blank rows = not tested) *Pain with all ankle MMT bilaterally*   LOWER EXTREMITY SPECIAL TESTS:  Windlass (-)    FUNCTIONAL TESTS:  SLS 3 second bilateral    GAIT: Distance walked: 10 ft  Assistive device utilized: None Level of assistance: Complete Independence Comments: foot flat initial contact, WBOS, excessive pronation, limited push-off   DATE: 08/15/2021 Aquatic therapy at [facility]- [address] - therapeutic pool temp 92 degrees Pt enters building ambulating independently  Treatment took place in water 3.8 to  4 ft 8 in.feet deep depending upon activity.  Pt entered and exited the pool via stair and handrails independently  Patient entered water for aquatic therapy for first time and was introduced to principles and therapeutic effects of water as they ambulated and acclimated to pool.   Therapeutic Exercise: Walking forward/backwards/side stepping x 2 laps each Lateral cross over walking Walking march - 2 laps Toe walking - 2 laps Tandem toe walking - 2  laps Walking march with plantar flexion - 2 laps Jogging on toes - 2 laps Heel raise - 20x S/L heel raise - 2x20 Noodle stomp - yellow - 2x20 Hip circles CW/CC - x20 ea Squats - 3x10 Calf stretch on  wall of pool Step up 2x10 ea fwd and lat   Pt requires the buoyancy of water for active assisted exercises with buoyancy supported for strengthening and AROM exercises. Hydrostatic pressure also supports joints by unweighting joint load by at least 50 % in 3-4 feet depth water. 80% in chest to neck deep water. Water will provide assistance with movement using the current and laminar flow while the buoyancy reduces weight bearing. Pt requires the viscosity of the water for resistance with strengthening exercises.  [facility] Adult PT Treatment:                                                DATE: 08/06/22 Therapeutic Exercise: Calf stretch on wedge x 1 minute  Seated ankle rockerboard 2 x 10  Seated marble pickups 2 x 15 Toe yoga 2 x 10  Towel slides x 5 each   Neuromuscular re-ed: Romberg on foam x 30 sec Romberg on foam eyes closed 2 x 30 sec  Semi-tandem on foam x 30 sec each    [facility] Adult PT Treatment:                                                DATE: 08/03/22 Therapeutic Exercise: NuStep level 5 LE only x 5 minutes  Calf stretch on wedge x 1 minute  Hip bridge 2 x 10  SLR 2 x 10  Sidelying hip abduction 2 x 10  Resisted ankle PF black band 2 x 10  Updated HEP  Manual Therapy: IASTM Lt peroneals, gastroc, and plantar fascia    [facility] Adult PT Treatment:                                                DATE: 07/26/22 Therapeutic Exercise: Seated towel scrunch  Seated towel scrunches  Seated Calf stretch with strap  Seated green band PF Left hamstring stretch with strap  Left hip abduction x 10 Standing gastroc stretch 2 x 30 sec each  Standing rocker board Tandem stance trials Seated heel raise 5# 15 x 2  Modalities: Cold pack to left ankle x 10 minutes      PATIENT  EDUCATION:  Education details: see treatment Person educated: Patient Education method: Explanation, Demonstration, Tactile cues, Verbal cues, and Handouts Education comprehension: verbalized understanding, returned demonstration, verbal cues required, tactile cues required, and needs further education   HOME EXERCISE PROGRAM: Access Code: [email or web address] URL: [email or web address]     ASSESSMENT:   CLINICAL IMPRESSION: Session today focused on ankle and general LE strengtehning in the aquatic environment for use of buoyancy to offload joints and the viscosity of water as resistance during therapeutic exercise.  Continued emphasis of plantar flexed positions to load G/S complex.  Integrated narrow base of support walking to improve balance.  Patient was able to tolerate all prescribed exercises in the aquatic environment with no adverse effects and reports 7/10 pain at the end of the session. Patient continues to benefit from skilled PT services on land and aquatic based and should be progressed as able to improve functional independence.  OBJECTIVE IMPAIRMENTS: Abnormal gait, decreased activity tolerance, decreased balance, decreased knowledge of condition, difficulty walking, decreased ROM, decreased strength, increased fascial restrictions, improper body mechanics, postural dysfunction, and pain.    ACTIVITY LIMITATIONS: carrying, lifting, bending, standing, squatting, stairs, transfers, and locomotion level   PARTICIPATION LIMITATIONS: meal prep, cleaning, laundry, shopping, community activity, and occupation   PERSONAL FACTORS: Age, Fitness, Profession, Time since onset of injury/illness/exacerbation, and 3+ comorbidities: see PMH above  are also affecting patient's functional outcome.    REHAB POTENTIAL: Fair chronicity of injury   CLINICAL DECISION MAKING: Stable/uncomplicated   EVALUATION COMPLEXITY: Low     GOALS: Goals reviewed with patient? Yes   SHORT TERM  GOALS: Target date: 08/06/2022     Patient will be independent and compliant with initial HEP.    Baseline: issued at eval  Goal status: met   2.  Patient will improve bilateral ankle DF AROM by at least 5 degrees to improve gait mechanics.  Baseline: see above  07/26/22: +2 Goal status: ONGOING   3.  Patient will be able to perform full range SL calf raise to improve push-off during gait cycle.  Baseline: see above  07/26/22: Min lift bilateral heel raise Goal status: ONGOING     LONG TERM GOALS: Target date: 09/08/2022     Patient will maintain SLS for at least 8 seconds to improve gait stability.  Baseline: see above  Goal status: INITIAL   2.  Patient will demonstrate pain free 5/5 bilateral ankle strength to improve stability when navigating on uneven terrain.  Baseline: see above  Goal status: INITIAL   3.  Patient will demonstrate at least 4/5 bilateral hip abductor strength to improve stability about the chain with prolonged walking/standing activity.  Baseline: see above  Goal status: INITIAL   4.  Patient will score at least 40/80 on the LEFS to signify clinically meaningful improvement in functional abilities.  Baseline: see above  Goal status: INITIAL   5.  Patient will report pain at worst rated as </= 5/10 to reduce her current functional limitations.  Baseline: see above  Goal status: INITIAL   6. Patient will complete floor transfer independently to improve ability to transfer in/out of her bath tub.             Baseline: unable            Goal status: INITIAL         PLAN:   PT FREQUENCY: 1-2x/week   PT DURATION: 8 weeks   PLANNED INTERVENTIONS: Therapeutic exercises, Therapeutic activity, Neuromuscular re-education, Balance training, Gait training, Patient/Family education, Self Care, Joint mobilization, Aquatic Therapy, Dry Needling, Cryotherapy, Moist heat, Ionotophoresis 4mg /ml Dexamethasone, Manual therapy, and Re-evaluation   PLAN FOR NEXT  SESSION: review and progress HEP, foot intrinsics, ankle/hip strengthening, calf stretching, consider TPDN gastroc   Kimberlee Nearing Melville Engen PT 08/17/22 7:19 AM

## 2022-08-17 ENCOUNTER — Encounter: Payer: Self-pay | Admitting: Physical Therapy

## 2022-08-21 ENCOUNTER — Ambulatory Visit: Payer: Medicaid Other

## 2022-08-23 ENCOUNTER — Ambulatory Visit: Payer: Medicaid Other

## 2022-08-28 NOTE — Progress Notes (Signed)
[facility] - Clinic Note  09/11/2022     CHIEF COMPLAINT Patient presents for Retina Follow Up   HISTORY OF PRESENT ILLNESS: Sheryl Cox is a 52 y.o. female who presents to the clinic today for:   HPI     Retina Follow Up   Patient presents with  PVD.  In both eyes.  This started years ago.  Duration of 5 weeks.        Comments   Patient feels that the vision has slightly improved. She is not using eye drops. She does not check her blood sugar.      Last edited by Charlette Caffey, COT on 09/11/2022  9:03 AM.      Referring physician: Renaye Rakers, MD [address] [place],  [place] [address]  HISTORICAL INFORMATION:   Selected notes from the MEDICAL RECORD NUMBER ED follow up for concern of floaters OU   CURRENT MEDICATIONS: Current Outpatient Medications (Ophthalmic Drugs)  Medication Sig   erythromycin ophthalmic ointment Place a 1/2 inch ribbon of ointment into the lower eyelid.   No current facility-administered medications for this visit. (Ophthalmic Drugs)   Current Outpatient Medications (Other)  Medication Sig   acetaminophen (TYLENOL) 325 MG tablet Take 650 mg by mouth daily as needed for headache.   acetaminophen-codeine 120-12 MG/5ML solution TAKE BY MOUTH AT BEDTIME AS NEEDED   albuterol (VENTOLIN HFA) 108 (90 Base) MCG/ACT inhaler Inhale 1-2 puffs into the lungs every 6 (six) hours as needed for wheezing or shortness of breath.   ALPRAZolam (XANAX) 0.25 MG tablet TAKE 1 TABLET BY MOUTH TWICE A DAY AS NEEDED FOR ANXIETY   amLODipine (NORVASC) 10 MG tablet Take 10 mg by mouth daily.   Ascorbic Acid (VITAMIN C PO) Take by mouth.   celecoxib (CELEBREX) 200 MG capsule Take 1 capsule (200 mg total) by mouth 2 (two) times daily.   Cholecalciferol 1.25 MG (50000 UT) capsule    cyclobenzaprine (FLEXERIL) 10 MG tablet Take 0.5-1 tablets (5-10 mg total) by mouth 2 (two) times daily as needed for muscle spasms.   diclofenac  Sodium (VOLTAREN) 1 % GEL Apply 4 g topically 4 (four) times daily.   ELDERBERRY PO Take by mouth.   fluticasone (FLONASE) 50 MCG/ACT nasal spray Place 1 spray into both nostrils daily.   hydrochlorothiazide (HYDRODIURIL) 12.5 MG tablet Take 2 tablets (25 mg total) by mouth daily. (Patient taking differently: Take 12.5 mg by mouth daily.)   meclizine (ANTIVERT) 12.5 MG tablet Take 1 tablet (12.5 mg total) by mouth 3 (three) times daily as needed for dizziness.   metFORMIN (GLUCOPHAGE) 500 MG tablet Take 1 tablet (500 mg total) by mouth 2 (two) times daily with a meal. (Patient taking differently: Take 500 mg by mouth daily.)   methocarbamol (ROBAXIN) 500 MG tablet Take 1 tablet (500 mg total) by mouth 2 (two) times daily.   Multiple Vitamins-Minerals (MULTIVITAMIN PO) Take 1 tablet by mouth daily.   ondansetron (ZOFRAN) 4 MG tablet Take 1 tablet (4 mg total) by mouth every 6 (six) hours.   OZEMPIC, 2 MG/DOSE, 8 MG/3ML SOPN Inject 2 mg into the skin once a week.   promethazine-dextromethorphan (PROMETHAZINE-DM) 6.25-15 MG/5ML syrup Take 5 mLs by mouth 2 (two) times daily as needed for cough.   tamoxifen (NOLVADEX) 20 MG tablet Take 1 tablet (20 mg total) by mouth daily.   Vitamin D, Ergocalciferol, (DRISDOL) 1.25 MG (50000 UNIT) CAPS capsule Take 50,000 Units  by mouth once a week.   No current facility-administered medications for this visit. (Other)   REVIEW OF SYSTEMS: ROS   Positive for: Endocrine, Cardiovascular, Eyes Negative for: Constitutional, Gastrointestinal, Neurological, Skin, Genitourinary, Musculoskeletal, HENT, Respiratory, Psychiatric, Allergic/Imm, Heme/Lymph Last edited by Charlette Caffey, COT on 09/11/2022  9:03 AM.      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+, PR+.Takes Tamoxifen daily   Diabetes mellitus without complication (HCC)    takes Metformin daily   History of migraine    several yrs ago   Hypertension    takes Tribenzor daily   Past Surgical History:  Procedure Laterality Date   BIOPSY BREAST Left 12/05/12   fibroadenoma   BREAST LUMPECTOMY     2014   BREAST SURGERY Right    CESAREAN SECTION  01/05/2009   CHOLECYSTECTOMY N/A 01/31/2016   Procedure: LAPAROSCOPIC CHOLECYSTECTOMY;  Surgeon: Harriette Bouillon, MD;  Location: [facility] OR;  Service: General;  Laterality: N/A;   DILITATION & CURRETTAGE/HYSTROSCOPY WITH HYDROTHERMAL ABLATION N/A 08/12/2013   Procedure: DILATATION & CURETTAGE/HYSTEROSCOPY WITH HYDROTHERMAL ABLATION;  Surgeon: Kathreen Cosier, MD;  Location: [facility];  Service: Gynecology;  Laterality: N/A;   ROBOTIC ASSISTED TOTAL HYSTERECTOMY N/A 06/18/2017   Procedure: XI ROBOTIC ASSISTED TOTAL HYSTERECTOMY;  Surgeon: Willodean Rosenthal, MD;  Location: [facility];  Service: Gynecology;  Laterality: N/A;   SALPINGOOPHORECTOMY Bilateral 06/18/2017   Procedure: BILATERAL SALPINGO OOPHORECTOMY;  Surgeon: Willodean Rosenthal, MD;  Location: [facility];  Service: Gynecology;  Laterality: Bilateral;   FAMILY HISTORY Family History  Problem Relation Age of Onset   Hypertension Mother    Diabetes Mother    COPD Mother    Macular degeneration Father    Glaucoma Father    Prostate cancer Father 33   Breast cancer Paternal Aunt    Prostate cancer Paternal Uncle    Breast cancer Paternal Grandmother        dx in her 30s   Glaucoma Paternal Grandfather    Prostate cancer Paternal Grandfather    Breast cancer Other        paternal grandmother's sister   Anesthesia problems Neg Hx    Amblyopia Neg Hx    Blindness Neg Hx     Cataracts Neg Hx    Retinal detachment Neg Hx    Strabismus Neg Hx    Retinitis pigmentosa Neg Hx    SOCIAL HISTORY Social History   Tobacco Use   Smoking status: Never   Smokeless tobacco: Never  Vaping Use   Vaping Use: Never used  Substance Use Topics   Alcohol use: Not Currently    Alcohol/week: 1.0 standard drink of alcohol    Types: 1 Cans of beer per week    Comment: occa   Drug use: No       OPHTHALMIC EXAM: Base Eye Exam     Visual Acuity (Snellen - Linear)       Right Left   Dist cc 20/20 20/20    Correction: Glasses  Tonometry (Tonopen, 9:06 AM)       Right Left   Pressure 14 14         Pupils       Dark Light Shape React APD   Right 2 1 Round Slow None   Left 2 1 Round Slow None         Visual Fields       Left Right    Full Full         Extraocular Movement       Right Left    Full, Ortho Full, Ortho         Neuro/Psych     Oriented x3: Yes   Mood/Affect: Normal         Dilation     Both eyes: 1.0% Mydriacyl, 2.5% Phenylephrine @ 9:03 AM           Slit Lamp and Fundus Exam     Slit Lamp Exam       Right Left   Lids/Lashes Normal Normal   Conjunctiva/Sclera White and quiet White and quiet   Cornea Clear Clear   Anterior Chamber Deep and quiet Deep and quiet   Iris Round and moderately dilated, No NVI Round and moderately dilated, No NVI   Lens 2+ Cortical cataract, 2+ Nuclear sclerosis 2+ Cortical cataract, 2+ Nuclear sclerosis   Anterior Vitreous Vitreous syneresis, Posterior vitreous detachment Vitreous syneresis, PVD         Fundus Exam       Right Left   Disc Pink and Sharp, mild PPA Pink and Sharp compact, temp PPP   C/D Ratio 0.2 0.2   Macula Good foveal reflex, mild RPE mottling, No heme or edema Flat Good foveal reflex, mild RPE mottling, No heme or edema   Vessels Mild attenuation Mild attenuation   Periphery Attached, No RT/RD Attached, No RT/RD           IMAGING AND  PROCEDURES  Imaging and Procedures for @TODAY @  OCT, Retina - OU - Both Eyes       Right Eye Quality was good. Central Foveal Thickness: 243. Progression has been stable. Findings include normal foveal contour, no IRF, no SRF (Trace vit opacities - -improved).   Left Eye Quality was good. Central Foveal Thickness: 231. Progression has been stable. Findings include normal foveal contour, no IRF, no SRF (stable release of VMA to full PVD.).   Notes *Images captured and stored on drive  Diagnosis / Impression:  OD: NFP, No IRF/SRF, trace vitreous opacities. - improved OS: NFP, No IRF/SRF, stable release of VMA to full PVD.   Clinical management:  See below  Abbreviations: NFP - Normal foveal profile. CME - cystoid macular edema. PED - pigment epithelial detachment. IRF - intraretinal fluid. SRF - subretinal fluid. EZ - ellipsoid zone. ERM - epiretinal membrane. ORA - outer retinal atrophy. ORT - outer retinal tubulation. SRHM - subretinal hyper-reflective material                ASSESSMENT/PLAN:    ICD-10-CM   1. Posterior vitreous detachment of both eyes  H43.813 OCT, Retina - OU - Both Eyes    2. Diabetes mellitus type 2 without retinopathy (HCC)  E11.9     3. Long term (current) use of oral hypoglycemic drugs  Z79.84     4. Essential hypertension  I10     5. Hypertensive retinopathy of both eyes  H35.033     6. Combined forms of age-related cataract  of both eyes  H25.813     7. Myopia of both eyes  H52.13       1. PVD / vitreous syneresis OU  - Presented w/ acute symptomatic floater OD (09.19.23)  - Presented w/ acute symptomatic FOL OS (05.14.24) starting 1 wk prior.  - Discussed findings and prognosis  - No RT or RD on 360 scleral depressed exam OS  - Reviewed s/s of RT/RD  - Strict return precautions for any such RT/RD signs/symptoms  - F/U PRN  2,3. Diabetes mellitus, type 2 without retinopathy  - A1C 6.6 (8.4.23) - The incidence, risk factors for  progression, natural history and treatment options for diabetic retinopathy  were discussed with patient.   - The need for close monitoring of blood glucose, blood pressure, and serum lipids, avoiding cigarette or any type of tobacco, and the need for long term follow up was also discussed with patient. - f/u PRN  4,5. Hypertensive retinopathy OU - discussed importance of tight BP control - monitor  6. Mixed Cataract OU - The symptoms of cataract, surgical options, and treatments and risks were discussed with patient. - discussed diagnosis and progression - monitor for now  7. Mild Myopia OU-  - doing well  - monitor  Ophthalmic Meds Ordered this visit:  No orders of the defined types were placed in this encounter.    Return if symptoms worsen or fail to improve.  There are no Patient Instructions on file for this visit.   Explained the diagnoses, plan, and follow up with the patient and they expressed understanding.  Patient expressed understanding of the importance of proper follow up care.   This document serves as a record of services personally performed by Karie Chimera, MD, PhD. It was created on their behalf by Gerilyn Nestle, COT an ophthalmic technician. The creation of this record is the provider's dictation and/or activities during the visit.    Electronically signed by:  Gerilyn Nestle, COT  06.11.24 9:43 AM  This document serves as a record of services personally performed by Karie Chimera, MD, PhD. It was created on their behalf by Gerilyn Nestle, COT an ophthalmic technician. The creation of this record is the provider's dictation and/or activities during the visit.    Electronically signed by:  Charlette Caffey, COT  09/11/22 9:43 AM   Karie Chimera, M.D., Ph.D. Diseases & Surgery of the Retina and Vitreous [facility]  Abbreviations: M myopia (nearsighted); A astigmatism; H hyperopia (farsighted); P presbyopia;  Mrx spectacle prescription;  CTL contact lenses; OD right eye; OS left eye; OU both eyes  XT exotropia; ET esotropia; PEK punctate epithelial keratitis; PEE punctate epithelial erosions; DES dry eye syndrome; MGD meibomian gland dysfunction; ATs artificial tears; PFAT's preservative free artificial tears; NSC nuclear sclerotic cataract; PSC posterior subcapsular cataract; ERM epi-retinal membrane; PVD posterior vitreous detachment; RD retinal detachment; DM diabetes mellitus; DR diabetic retinopathy; NPDR non-proliferative diabetic retinopathy; PDR proliferative diabetic retinopathy; CSME clinically significant macular edema; DME diabetic macular edema; dbh dot blot hemorrhages; CWS cotton wool spot; POAG primary open angle glaucoma; C/D cup-to-disc ratio; HVF humphrey visual field; GVF goldmann visual field; OCT optical coherence tomography; IOP intraocular pressure; BRVO Branch retinal vein occlusion; CRVO central retinal vein occlusion; CRAO central retinal artery occlusion; BRAO branch retinal artery occlusion; RT retinal tear; SB scleral buckle; PPV pars plana vitrectomy; VH Vitreous hemorrhage; PRP panretinal laser photocoagulation; IVK intravitreal kenalog; VMT vitreomacular traction; MH Macular hole;  NVD neovascularization of the disc; NVE neovascularization elsewhere; AREDS age related eye disease study; ARMD age related macular degeneration; POAG primary open angle glaucoma; EBMD epithelial/anterior basement membrane dystrophy; ACIOL anterior chamber intraocular lens; IOL intraocular lens; PCIOL posterior chamber intraocular lens; Phaco/IOL phacoemulsification with intraocular lens placement; [place] photorefractive keratectomy; LASIK laser assisted in situ keratomileusis; HTN hypertension; DM diabetes mellitus; COPD chronic obstructive pulmonary disease

## 2022-08-29 ENCOUNTER — Encounter: Payer: Self-pay | Admitting: Physical Therapy

## 2022-08-29 ENCOUNTER — Ambulatory Visit: Payer: Medicaid Other | Attending: Podiatry | Admitting: Physical Therapy

## 2022-08-29 DIAGNOSIS — M6281 Muscle weakness (generalized): Secondary | ICD-10-CM | POA: Diagnosis present

## 2022-08-29 DIAGNOSIS — R2689 Other abnormalities of gait and mobility: Secondary | ICD-10-CM | POA: Insufficient documentation

## 2022-08-29 DIAGNOSIS — M25572 Pain in left ankle and joints of left foot: Secondary | ICD-10-CM | POA: Diagnosis present

## 2022-08-29 DIAGNOSIS — M25571 Pain in right ankle and joints of right foot: Secondary | ICD-10-CM | POA: Insufficient documentation

## 2022-08-29 NOTE — Therapy (Signed)
OUTPATIENT PHYSICAL THERAPY TREATMENT NOTE   Patient Name: Sheryl Cox MRN: [id number] DOB:13-Sep-1970, 52 y.o., female Today's Date: 08/29/2022  PCP: Renaye Rakers, MD   REFERRING PROVIDER: Edwin Cap, DPM  END OF SESSION:   PT End of Session - 08/29/22 0945     Visit Number 10    Number of Visits 17    Date for PT Re-Evaluation 09/08/22    Authorization Type [employer] MCD; 27 VL    PT Start Time 0945   arrived late   PT Stop Time 1015    PT Time Calculation (min) 30 min                Past Medical History:  Diagnosis Date   Anemia    history of blood transfusion-no abnormal reaction noted   Asthma    Albuterol as needed   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+, PR+.Takes Tamoxifen daily   Diabetes mellitus without complication (HCC)    takes Metformin daily   History of migraine    several yrs ago   Hypertension    takes Tribenzor daily   Past Surgical History:  Procedure Laterality Date   BIOPSY BREAST Left 12/05/12   fibroadenoma   BREAST LUMPECTOMY     2014   BREAST SURGERY Right    CESAREAN SECTION  01/05/2009   CHOLECYSTECTOMY N/A 01/31/2016   Procedure: LAPAROSCOPIC CHOLECYSTECTOMY;  Surgeon: Harriette Bouillon, MD;  Location: [facility];  Service: General;  Laterality: N/A;   DILITATION & CURRETTAGE/HYSTROSCOPY WITH HYDROTHERMAL ABLATION N/A 08/12/2013   Procedure: DILATATION & CURETTAGE/HYSTEROSCOPY WITH HYDROTHERMAL ABLATION;  Surgeon: Kathreen Cosier, MD;  Location: [facility];  Service: Gynecology;  Laterality: N/A;   ROBOTIC ASSISTED TOTAL HYSTERECTOMY N/A 06/18/2017   Procedure: XI ROBOTIC ASSISTED TOTAL HYSTERECTOMY;  Surgeon: Willodean Rosenthal, MD;  Location: [facility];  Service: Gynecology;  Laterality: N/A;   SALPINGOOPHORECTOMY Bilateral 06/18/2017   Procedure: BILATERAL SALPINGO OOPHORECTOMY;  Surgeon: Willodean Rosenthal, MD;  Location: [facility];  Service: Gynecology;  Laterality: Bilateral;   Patient Active Problem List   Diagnosis Date  Noted   Iron deficiency anemia 05/29/2022   Osteoarthritis of acromioclavicular joint 02/13/2022   Neck pain 08/03/2020   Chronic pain of both knees 05/21/2018   Chronic right shoulder pain 05/21/2018   BMI 40.0-44.9, adult (HCC) 05/21/2018   Anemia 06/05/2013   Menorrhagia 12/30/2012   Hypertension 12/10/2012   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   Nabothian cyst 12/07/2011   Cervical mass 11/20/2011   Fibroid uterus 11/20/2011    REFERRING DIAG:  M76.72 (ICD-10-CM) - Peroneal tendinitis, left   THERAPY DIAG:  Pain in left ankle and joints of left foot  Pain in right ankle and joints of right foot  Rationale for Evaluation and Treatment Rehabilitation  PERTINENT HISTORY: Breast cancer Diabetes Hypertension   PRECAUTIONS: none  SUBJECTIVE:  SUBJECTIVE STATEMENT: The pool really helps. The feet and ankles are getting there.  Left foot hurts more now because I am wearing these flat shoes.    PAIN:  Are you having pain? Yes: NPRS scale: 6/10  Pain location: LLE (coursing from anterior thigh to medial foot) and lateral Lt foot/ankle  Pain description: ache Aggravating factors: walking, standing, "wrong shoes" Relieving factors: soaking,rest    OBJECTIVE: (objective measures completed at initial evaluation unless otherwise dated)   DIAGNOSTIC FINDINGS:  Multiple views x-ray of both feet: no fracture, dislocation, swelling or degenerative changes noted, plantar calcaneal spur, and pes planus   PATIENT SURVEYS:  LEFS 25/80   COGNITION: Overall cognitive status: Within functional limits for tasks assessed                         SENSATION: WFL   EDEMA:  Mild swelling about Lt lateral ankle      POSTURE: Pes planus in standing   PALPATION: TTP bilateral  plantar fascia, Lt peroneal tendons    LOWER EXTREMITY ROM:   Active ROM Right eval Left eval Left  07/26/22 08/03/22 Left  08/29/22 Right 08/29/22 Left  Hip flexion          Hip extension          Hip abduction          Hip adduction          Hip internal rotation          Hip external rotation          Knee flexion          Knee extension          Ankle dorsiflexion 2 Lacking 4 2 4 4 2   Ankle plantarflexion WNL WNL      Ankle inversion WNL WNL      Ankle eversion WNL WNL       (Blank rows = not tested)   LOWER EXTREMITY MMT:   MMT Right eval Left eval Right 08/29/22 Left 08/29/22  Hip flexion 5 5    Hip extension        Hip abduction 3+ 3+ 4- 4-  Hip adduction        Hip internal rotation        Hip external rotation        Knee flexion        Knee extension        Ankle dorsiflexion 5 4 4+ 4+  Ankle plantarflexion SL partial calf raise SL partial calf raise     Ankle inversion 4+ 4+ 4+ 4+  Ankle eversion 5 4+ 4+ 4+ p   (Blank rows = not tested) *Pain with all ankle MMT bilaterally*   LOWER EXTREMITY SPECIAL TESTS:  Windlass (-)    FUNCTIONAL TESTS:  SLS 3 second bilateral    GAIT: Distance walked: 10 ft  Assistive device utilized: None Level of assistance: Complete Independence Comments: foot flat initial contact, WBOS, excessive pronation, limited push-off   [facility] Adult PT Treatment:                                                DATE: 08/29/22 Therapeutic Exercise: MMT AROM DF Heel raise x 10 S/L leg heel raise - min lift each SLS 8 sec best  +HEP Tandem  stance x 60 sec  [employer] broadcast assistant    DATE: 08/15/2021 Aquatic therapy at [facility]- [address] - therapeutic pool temp 92 degrees Pt enters building ambulating independently  Treatment took place in water 3.8 to  4 ft 8 in.feet deep depending upon activity.  Pt entered and exited the pool via stair and handrails independently  Patient entered water for aquatic  therapy for first time and was introduced to principles and therapeutic effects of water as they ambulated and acclimated to pool.   Therapeutic Exercise: Walking forward/backwards/side stepping x 2 laps each Lateral cross over walking Walking march - 2 laps Toe walking - 2 laps Tandem toe walking - 2 laps Walking march with plantar flexion - 2 laps Jogging on toes - 2 laps Heel raise - 20x S/L heel raise - 2x20 Noodle stomp - yellow - 2x20 Hip circles CW/CC - x20 ea Squats - 3x10 Calf stretch on wall of pool Step up 2x10 ea fwd and lat   Pt requires the buoyancy of water for active assisted exercises with buoyancy supported for strengthening and AROM exercises. Hydrostatic pressure also supports joints by unweighting joint load by at least 50 % in 3-4 feet depth water. 80% in chest to neck deep water. Water will provide assistance with movement using the current and laminar flow while the buoyancy reduces weight bearing. Pt requires the viscosity of the water for resistance with strengthening exercises.  [facility] Adult PT Treatment:                                                DATE: 08/06/22 Therapeutic Exercise: Calf stretch on wedge x 1 minute  Seated ankle rockerboard 2 x 10  Seated marble pickups 2 x 15 Toe yoga 2 x 10  Towel slides x 5 each   Neuromuscular re-ed: Romberg on foam x 30 sec Romberg on foam eyes closed 2 x 30 sec  Semi-tandem on foam x 30 sec each    [facility] Adult PT Treatment:                                                DATE: 08/03/22 Therapeutic Exercise: NuStep level 5 LE only x 5 minutes  Calf stretch on wedge x 1 minute  Hip bridge 2 x 10  SLR 2 x 10  Sidelying hip abduction 2 x 10  Resisted ankle PF black band 2 x 10  Updated HEP  Manual Therapy: IASTM Lt peroneals, gastroc, and plantar fascia    [facility] Adult PT Treatment:                                                DATE: 07/26/22 Therapeutic Exercise: Seated towel scrunch  Seated towel scrunches   Seated Calf stretch with strap  Seated green band PF Left hamstring stretch with strap  Left hip abduction x 10 Standing gastroc stretch 2 x 30 sec each  Standing rocker board Tandem stance trials Seated heel raise 5# 15 x 2  Modalities: Cold pack to left ankle x 10 minutes  PATIENT EDUCATION:  Education details: see treatment Person educated: Patient Education method: Explanation, Demonstration, Tactile cues, Verbal cues, and Handouts Education comprehension: verbalized understanding, returned demonstration, verbal cues required, tactile cues required, and needs further education   HOME EXERCISE PROGRAM: Access Code: [email or web address] URL: [email or web address]     ASSESSMENT:   CLINICAL IMPRESSION: Pt arrives with increased left ankle pain due to her flat shoes. She reports pain is dependent on her shoes. She has made improvement in hip strength and is able to min lift into a Single leg calf raise, proressing toward several LTGs.  Improved height with bilateral calf raise. Her SLS is 6-8 seconds at best which partially meets LTG#1. She reports that aquatic therapy is helpful for her pain and mobility.  Patient continues to benefit from skilled PT services on land and aquatic based and should be progressed as able to improve functional independence.     OBJECTIVE IMPAIRMENTS: Abnormal gait, decreased activity tolerance, decreased balance, decreased knowledge of condition, difficulty walking, decreased ROM, decreased strength, increased fascial restrictions, improper body mechanics, postural dysfunction, and pain.    ACTIVITY LIMITATIONS: carrying, lifting, bending, standing, squatting, stairs, transfers, and locomotion level   PARTICIPATION LIMITATIONS: meal prep, cleaning, laundry, shopping, community activity, and occupation   PERSONAL FACTORS: Age, Fitness, Profession, Time since onset of injury/illness/exacerbation, and 3+ comorbidities: see PMH above  are also  affecting patient's functional outcome.    REHAB POTENTIAL: Fair chronicity of injury   CLINICAL DECISION MAKING: Stable/uncomplicated   EVALUATION COMPLEXITY: Low     GOALS: Goals reviewed with patient? Yes   SHORT TERM GOALS: Target date: 08/06/2022     Patient will be independent and compliant with initial HEP.    Baseline: issued at eval  Goal status: met   2.  Patient will improve bilateral ankle DF AROM by at least 5 degrees to improve gait mechanics.  Baseline: see above  07/26/22: +2 08/29/22: Partially met for the left -see Chart Goal status: ONGOING   3.  Patient will be able to perform full range SL calf raise to improve push-off during gait cycle.  Baseline: see above  07/26/22: Min lift bilateral heel raise 08/29/22: Min left- single heel raise, each Goal status: ONGOING     LONG TERM GOALS: Target date: 09/08/2022     Patient will maintain SLS for at least 8 seconds to improve gait stability.  Baseline: see above  08/29/22: right 6 sec, Left 8 sec  Goal status: PARTIALLY MET   2.  Patient will demonstrate pain free 5/5 bilateral ankle strength to improve stability when navigating on uneven terrain.  Baseline: see above  08/29/22: grossly 4+/5 except PF which is 2+ Goal status: ONGOING   3.  Patient will demonstrate at least 4/5 bilateral hip abductor strength to improve stability about the chain with prolonged walking/standing activity.  Baseline: see above  08/29/22: hip abduction improved to 4-/5 Goal status: ONGOING   4.  Patient will score at least 40/80 on the LEFS to signify clinically meaningful improvement in functional abilities.  Baseline: see above  Goal status: INITIAL   5.  Patient will report pain at worst rated as </= 5/10 to reduce her current functional limitations.  Baseline: see above  08/29/22: at worst 7/10 Goal status: ONGOING   6. Patient will complete floor transfer independently to improve ability to transfer in/out of her bath  tub.             Baseline: unable  Goal status: UNABLE TO ASSESS         PLAN:   PT FREQUENCY: 1-2x/week   PT DURATION: 8 weeks   PLANNED INTERVENTIONS: Therapeutic exercises, Therapeutic activity, Neuromuscular re-education, Balance training, Gait training, Patient/Family education, Self Care, Joint mobilization, Aquatic Therapy, Dry Needling, Cryotherapy, Moist heat, Ionotophoresis 4mg /ml Dexamethasone, Manual therapy, and Re-evaluation   PLAN FOR NEXT SESSION: check LEFS and remaining goals over remainder of POC.  review and progress HEP, foot intrinsics, ankle/hip strengthening, calf stretching, consider TPDN gastroc   Jannette Spanner, PTA 08/29/22 11:49 AM Phone: [phone] Fax: [phone]

## 2022-08-31 ENCOUNTER — Encounter (HOSPITAL_COMMUNITY): Payer: Self-pay

## 2022-08-31 ENCOUNTER — Ambulatory Visit (HOSPITAL_COMMUNITY)
Admission: EM | Admit: 2022-08-31 | Discharge: 2022-08-31 | Disposition: A | Discharge status: Home / Self Care | Payer: Medicaid Other | Attending: Emergency Medicine | Admitting: Emergency Medicine

## 2022-08-31 ENCOUNTER — Ambulatory Visit: Payer: Medicaid Other | Admitting: Physical Therapy

## 2022-08-31 ENCOUNTER — Ambulatory Visit: Payer: Medicaid Other

## 2022-08-31 DIAGNOSIS — R2689 Other abnormalities of gait and mobility: Secondary | ICD-10-CM

## 2022-08-31 DIAGNOSIS — M25572 Pain in left ankle and joints of left foot: Secondary | ICD-10-CM

## 2022-08-31 DIAGNOSIS — H9202 Otalgia, left ear: Secondary | ICD-10-CM | POA: Diagnosis not present

## 2022-08-31 DIAGNOSIS — M25571 Pain in right ankle and joints of right foot: Secondary | ICD-10-CM

## 2022-08-31 DIAGNOSIS — M6281 Muscle weakness (generalized): Secondary | ICD-10-CM

## 2022-08-31 MED ORDER — CIPROFLOXACIN-DEXAMETHASONE 0.3-0.1 % OT SUSP: 4.0000 [drp] | Freq: Two times a day (BID) | OTIC | 0 refills | Status: AC

## 2022-08-31 NOTE — Therapy (Signed)
OUTPATIENT PHYSICAL THERAPY TREATMENT NOTE   Patient Name: Sheryl Cox MRN: [id number] DOB:13-Jul-1970, 52 y.o., female Today's Date: 08/31/2022  PCP: Renaye Rakers, MD   REFERRING PROVIDER: Edwin Cap, DPM  END OF SESSION:   PT End of Session - 08/31/22 1222     Visit Number 11    Number of Visits 17    Date for PT Re-Evaluation 09/08/22    Authorization Type U[employer] MCD; 27 VL    PT Start Time 1220    PT Stop Time 1305    PT Time Calculation (min) 45 min    Activity Tolerance Patient tolerated treatment well    Behavior During Therapy WFL for tasks assessed/performed                 Past Medical History:  Diagnosis Date   Anemia    history of blood transfusion-no abnormal reaction noted   Asthma    Albuterol as needed   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+, PR+.Takes Tamoxifen daily   Diabetes mellitus without complication (HCC)    takes Metformin daily   History of migraine    several yrs ago   Hypertension    takes Tribenzor daily   Past Surgical History:  Procedure Laterality Date   BIOPSY BREAST Left 12/05/12   fibroadenoma   BREAST LUMPECTOMY     2014   BREAST SURGERY Right    CESAREAN SECTION  01/05/2009   CHOLECYSTECTOMY N/A 01/31/2016   Procedure: LAPAROSCOPIC CHOLECYSTECTOMY;  Surgeon: Harriette Bouillon, MD;  Location: [facility];  Service: General;  Laterality: N/A;   DILITATION & CURRETTAGE/HYSTROSCOPY WITH HYDROTHERMAL ABLATION N/A 08/12/2013   Procedure: DILATATION & CURETTAGE/HYSTEROSCOPY WITH HYDROTHERMAL ABLATION;  Surgeon: Kathreen Cosier, MD;  Location: [facility];  Service: Gynecology;  Laterality: N/A;   ROBOTIC ASSISTED TOTAL HYSTERECTOMY N/A 06/18/2017   Procedure: XI ROBOTIC ASSISTED TOTAL HYSTERECTOMY;  Surgeon: Willodean Rosenthal, MD;  Location: [facility];  Service: Gynecology;  Laterality: N/A;   SALPINGOOPHORECTOMY Bilateral 06/18/2017   Procedure: BILATERAL SALPINGO OOPHORECTOMY;  Surgeon: Willodean Rosenthal, MD;  Location:  [facility];  Service: Gynecology;  Laterality: Bilateral;   Patient Active Problem List   Diagnosis Date Noted   Iron deficiency anemia 05/29/2022   Osteoarthritis of acromioclavicular joint 02/13/2022   Neck pain 08/03/2020   Chronic pain of both knees 05/21/2018   Chronic right shoulder pain 05/21/2018   BMI 40.0-44.9, adult (HCC) 05/21/2018   Anemia 06/05/2013   Menorrhagia 12/30/2012   Hypertension 12/10/2012   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   Nabothian cyst 12/07/2011   Cervical mass 11/20/2011   Fibroid uterus 11/20/2011    REFERRING DIAG:  M76.72 (ICD-10-CM) - Peroneal tendinitis, left   THERAPY DIAG:  Pain in left ankle and joints of left foot  Muscle weakness (generalized)  Pain in right ankle and joints of right foot  Other abnormalities of gait and mobility  Rationale for Evaluation and Treatment Rehabilitation  PERTINENT HISTORY: Breast cancer Diabetes Hypertension   PRECAUTIONS: none  SUBJECTIVE:  SUBJECTIVE STATEMENT: Patient reports continued pain, L>R but states the pain is in both feet due to heel spurs.    PAIN:  Are you having pain? Yes: NPRS scale: 6/10  Pain location: LLE (coursing from anterior thigh to medial foot) and lateral Lt foot/ankle  Pain description: ache Aggravating factors: walking, standing, "wrong shoes" Relieving factors: soaking,rest    OBJECTIVE: (objective measures completed at initial evaluation unless otherwise dated)   DIAGNOSTIC FINDINGS:  Multiple views x-ray of both feet: no fracture, dislocation, swelling or degenerative changes noted, plantar calcaneal spur, and pes planus   PATIENT SURVEYS:  LEFS 25/80   COGNITION: Overall cognitive status: Within functional limits for tasks assessed                          SENSATION: WFL   EDEMA:  Mild swelling about Lt lateral ankle      POSTURE: Pes planus in standing   PALPATION: TTP bilateral plantar fascia, Lt peroneal tendons    LOWER EXTREMITY ROM:   Active ROM Right eval Left eval Left  07/26/22 08/03/22 Left  08/29/22 Right 08/29/22 Left  Hip flexion          Hip extension          Hip abduction          Hip adduction          Hip internal rotation          Hip external rotation          Knee flexion          Knee extension          Ankle dorsiflexion 2 Lacking 4 2 4 4 2   Ankle plantarflexion WNL WNL      Ankle inversion WNL WNL      Ankle eversion WNL WNL       (Blank rows = not tested)   LOWER EXTREMITY MMT:   MMT Right eval Left eval Right 08/29/22 Left 08/29/22  Hip flexion 5 5    Hip extension        Hip abduction 3+ 3+ 4- 4-  Hip adduction        Hip internal rotation        Hip external rotation        Knee flexion        Knee extension        Ankle dorsiflexion 5 4 4+ 4+  Ankle plantarflexion SL partial calf raise SL partial calf raise     Ankle inversion 4+ 4+ 4+ 4+  Ankle eversion 5 4+ 4+ 4+ p   (Blank rows = not tested) *Pain with all ankle MMT bilaterally*   LOWER EXTREMITY SPECIAL TESTS:  Windlass (-)    FUNCTIONAL TESTS:  SLS 3 second bilateral    GAIT: Distance walked: 10 ft  Assistive device utilized: None Level of assistance: Complete Independence Comments: foot flat initial contact, WBOS, excessive pronation, limited push-off   TREATMENT [facility] Adult PT Treatment:                                                DATE: 08/31/22 Aquatic therapy at [facility] [place]- [address] - therapeutic pool temp 92 degrees Pt enters building ambulating independently  Treatment took place in water 3.8 to  4 ft 8  in.feet deep depending upon activity.  Pt entered and exited the pool via stair and handrails independently.    Therapeutic Exercise: Walking forward/backwards/side stepping x 2 laps each Walking  march - 2 laps Toe walking - 2 laps Tandem walking - 2 laps holding white barbell Walking march with plantar flexion - 2 laps Heel/toe raise - 20x S/L heel raise - 2x20  Hip circles CW/CC - x10 ea BIL Squats - 2x20 Calf stretch on wall of pool Step up 2x10 ea fwd and lat   Pt requires the buoyancy of water for active assisted exercises with buoyancy supported for strengthening and AROM exercises. Hydrostatic pressure also supports joints by unweighting joint load by at least 50 % in 3-4 feet depth water. 80% in chest to neck deep water. Water will provide assistance with movement using the current and laminar flow while the buoyancy reduces weight bearing. Pt requires the viscosity of the water for resistance with strengthening exercises.   [facility] Adult PT Treatment:                                                DATE: 08/29/22 Therapeutic Exercise: MMT AROM DF Heel raise x 10 S/L leg heel raise - min lift each SLS 8 sec best  +HEP Tandem stance x 60 sec  Wooden Psychologist, forensic board stretch    DATE: 08/15/2021 Aquatic therapy at [facility] [place]- [address] - therapeutic pool temp 92 degrees Pt enters building ambulating independently  Treatment took place in water 3.8 to  4 ft 8 in.feet deep depending upon activity.  Pt entered and exited the pool via stair and handrails independently  Patient entered water for aquatic therapy for first time and was introduced to principles and therapeutic effects of water as they ambulated and acclimated to pool.   Therapeutic Exercise: Walking forward/backwards/side stepping x 2 laps each Lateral cross over walking Walking march - 2 laps Toe walking - 2 laps Tandem toe walking - 2 laps Walking march with plantar flexion - 2 laps Jogging on toes - 2 laps Heel raise - 20x S/L heel raise - 2x20 Noodle stomp - yellow - 2x20 Hip circles CW/CC - x20 ea Squats - 3x10 Calf stretch on wall of pool Step up 2x10 ea fwd and lat   Pt requires  the buoyancy of water for active assisted exercises with buoyancy supported for strengthening and AROM exercises. Hydrostatic pressure also supports joints by unweighting joint load by at least 50 % in 3-4 feet depth water. 80% in chest to neck deep water. Water will provide assistance with movement using the current and laminar flow while the buoyancy reduces weight bearing. Pt requires the viscosity of the water for resistance with strengthening exercises.      PATIENT EDUCATION:  Education details: see treatment Person educated: Patient Education method: Explanation, Demonstration, Tactile cues, Verbal cues, and Handouts Education comprehension: verbalized understanding, returned demonstration, verbal cues required, tactile cues required, and needs further education   HOME EXERCISE PROGRAM: Access Code: [email or web address] URL: [email or web address]     ASSESSMENT:   CLINICAL IMPRESSION: Patient presents to PT reporting continued pain in BIL feet and reports HE compliance. Session today focused on LE strengthening and conditioning in the aquatic environment for use of buoyancy to offload joints and the viscosity of water as resistance during therapeutic exercise. Patient was able to  tolerate all prescribed exercises in the aquatic environment with no adverse effects. Patient continues to benefit from skilled PT services on land and aquatic based and should be progressed as able to improve functional independence.   OBJECTIVE IMPAIRMENTS: Abnormal gait, decreased activity tolerance, decreased balance, decreased knowledge of condition, difficulty walking, decreased ROM, decreased strength, increased fascial restrictions, improper body mechanics, postural dysfunction, and pain.    ACTIVITY LIMITATIONS: carrying, lifting, bending, standing, squatting, stairs, transfers, and locomotion level   PARTICIPATION LIMITATIONS: meal prep, cleaning, laundry, shopping, community activity, and  occupation   PERSONAL FACTORS: Age, Fitness, Profession, Time since onset of injury/illness/exacerbation, and 3+ comorbidities: see PMH above  are also affecting patient's functional outcome.    REHAB POTENTIAL: Fair chronicity of injury   CLINICAL DECISION MAKING: Stable/uncomplicated   EVALUATION COMPLEXITY: Low     GOALS: Goals reviewed with patient? Yes   SHORT TERM GOALS: Target date: 08/06/2022     Patient will be independent and compliant with initial HEP.    Baseline: issued at eval  Goal status: met   2.  Patient will improve bilateral ankle DF AROM by at least 5 degrees to improve gait mechanics.  Baseline: see above  07/26/22: +2 08/29/22: Partially met for the left -see Chart Goal status: ONGOING   3.  Patient will be able to perform full range SL calf raise to improve push-off during gait cycle.  Baseline: see above  07/26/22: Min lift bilateral heel raise 08/29/22: Min left- single heel raise, each Goal status: ONGOING     LONG TERM GOALS: Target date: 09/08/2022     Patient will maintain SLS for at least 8 seconds to improve gait stability.  Baseline: see above  08/29/22: right 6 sec, Left 8 sec  Goal status: PARTIALLY MET   2.  Patient will demonstrate pain free 5/5 bilateral ankle strength to improve stability when navigating on uneven terrain.  Baseline: see above  08/29/22: grossly 4+/5 except PF which is 2+ Goal status: ONGOING   3.  Patient will demonstrate at least 4/5 bilateral hip abductor strength to improve stability about the chain with prolonged walking/standing activity.  Baseline: see above  08/29/22: hip abduction improved to 4-/5 Goal status: ONGOING   4.  Patient will score at least 40/80 on the LEFS to signify clinically meaningful improvement in functional abilities.  Baseline: see above  Goal status: INITIAL   5.  Patient will report pain at worst rated as </= 5/10 to reduce her current functional limitations.  Baseline: see above   08/29/22: at worst 7/10 Goal status: ONGOING   6. Patient will complete floor transfer independently to improve ability to transfer in/out of her bath tub.             Baseline: unable            Goal status: UNABLE TO ASSESS         PLAN:   PT FREQUENCY: 1-2x/week   PT DURATION: 8 weeks   PLANNED INTERVENTIONS: Therapeutic exercises, Therapeutic activity, Neuromuscular re-education, Balance training, Gait training, Patient/Family education, Self Care, Joint mobilization, Aquatic Therapy, Dry Needling, Cryotherapy, Moist heat, Ionotophoresis 4mg /ml Dexamethasone, Manual therapy, and Re-evaluation   PLAN FOR NEXT SESSION: check LEFS and remaining goals over remainder of POC.  review and progress HEP, foot intrinsics, ankle/hip strengthening, calf stretching, consider TPDN gastroc   Berta Minor PTA 08/31/22 1:04 PM Phone: [phone] Fax: [phone]

## 2022-08-31 NOTE — ED Provider Notes (Signed)
[facility]    CSN: [id number] Arrival date & time: 08/31/22  1741      History   Chief Complaint Chief Complaint  Patient presents with   Otalgia    HPI Sheryl Cox is a 52 y.o. female.  Here with 2-3 day history of left ear pain Feels pressure, muffled hearing. 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+, PR+.Takes Tamoxifen daily   Diabetes mellitus without complication (HCC)    takes Metformin daily   History of migraine    several yrs ago   Hypertension    takes Tribenzor daily    Patient Active Problem List   Diagnosis Date Noted   Iron deficiency anemia 05/29/2022   Osteoarthritis of acromioclavicular joint 02/13/2022   Neck pain 08/03/2020   Chronic pain of both knees 05/21/2018   Chronic right shoulder pain 05/21/2018   BMI 40.0-44.9, adult (HCC) 05/21/2018   Anemia 06/05/2013   Menorrhagia 12/30/2012   Hypertension 12/10/2012   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   Nabothian cyst 12/07/2011   Cervical mass 11/20/2011   Fibroid uterus 11/20/2011    Past Surgical History:  Procedure Laterality Date   BIOPSY BREAST Left 12/05/12   fibroadenoma   BREAST LUMPECTOMY     2014   BREAST SURGERY Right    CESAREAN SECTION  01/05/2009   CHOLECYSTECTOMY N/A 01/31/2016   Procedure: LAPAROSCOPIC CHOLECYSTECTOMY;  Surgeon: Harriette Bouillon, MD;  Location: [facility];  Service: General;  Laterality: N/A;   DILITATION & CURRETTAGE/HYSTROSCOPY WITH HYDROTHERMAL ABLATION N/A 08/12/2013   Procedure: DILATATION & CURETTAGE/HYSTEROSCOPY WITH HYDROTHERMAL ABLATION;  Surgeon: Kathreen Cosier, MD;  Location: [facility];  Service: Gynecology;  Laterality: N/A;   ROBOTIC ASSISTED TOTAL HYSTERECTOMY N/A 06/18/2017   Procedure: XI ROBOTIC ASSISTED TOTAL HYSTERECTOMY;  Surgeon: Willodean Rosenthal, MD;  Location: [facility];  Service: Gynecology;  Laterality: N/A;   SALPINGOOPHORECTOMY Bilateral 06/18/2017   Procedure: BILATERAL SALPINGO OOPHORECTOMY;  Surgeon: Willodean Rosenthal, MD;  Location: [facility];  Service: Gynecology;  Laterality: Bilateral;    OB History     Gravida  1   Para  1   Term  1   Preterm  0   AB  0   Living  1      SAB  0   IAB  0   Ectopic  0   Multiple  0   Live Births  1            Home Medications    Prior to Admission medications   Medication Sig Start Date End Date Taking? Authorizing Provider  acetaminophen (TYLENOL) 325 MG tablet Take 650 mg by mouth daily as needed for headache.    [provider]  acetaminophen-codeine 120-12 MG/5ML solution TAKE BY MOUTH AT BEDTIME AS NEEDED    [provider]  albuterol (VENTOLIN HFA) 108 (90 Base) MCG/ACT inhaler Inhale 1-2 puffs into the lungs every 6 (six) hours as needed for wheezing or shortness of breath. 09/21/19   Wieters, Hallie C, PA-C  ALPRAZolam (XANAX) 0.25 MG tablet TAKE 1 TABLET BY MOUTH TWICE A DAY AS NEEDED FOR ANXIETY 08/25/18   Renaye Rakers, MD  amLODipine (NORVASC) 10 MG tablet Take 10 mg by mouth daily. 02/23/17   [provider]  Ascorbic Acid (VITAMIN C PO) Take by mouth.    [provider]  celecoxib (CELEBREX) 200 MG capsule Take 1 capsule (200 mg total) by mouth 2 (two) times daily. 10/20/19   Arthor Captain, PA-C  Cholecalciferol 1.25 MG (50000 UT) capsule  04/20/21   [provider]  cyclobenzaprine (FLEXERIL) 10 MG tablet Take 0.5-1 tablets (5-10 mg total) by mouth 2 (two) times daily as needed for muscle spasms. 10/20/19   Arthor Captain, PA-C  diclofenac Sodium (VOLTAREN) 1 % GEL Apply 4 g topically 4 (four) times daily. 07/30/20   Fondaw, Wylder S, PA  ELDERBERRY PO Take by mouth.    [provider]  erythromycin ophthalmic ointment Place a 1/2 inch ribbon of ointment into the lower eyelid. 09/18/21   Ward,  Tylene Fantasia, PA-C  fluticasone (FLONASE) 50 MCG/ACT nasal spray Place 1 spray into both nostrils daily. 03/17/22   Raspet, Noberto Retort, PA-C  hydrochlorothiazide (HYDRODIURIL) 12.5 MG tablet Take 2 tablets (25 mg total) by mouth daily. Patient taking differently: Take 12.5 mg by mouth daily. 05/14/18   Bing Neighbors, NP  meclizine (ANTIVERT) 12.5 MG tablet Take 1 tablet (12.5 mg total) by mouth 3 (three) times daily as needed for dizziness. 03/25/21   Rushie Chestnut, PA-C  metFORMIN (GLUCOPHAGE) 500 MG tablet Take 1 tablet (500 mg total) by mouth 2 (two) times daily with a meal. Patient taking differently: Take 500 mg by mouth daily. 06/19/17   Willodean Rosenthal, MD  methocarbamol (ROBAXIN) 500 MG tablet Take 1 tablet (500 mg total) by mouth 2 (two) times daily. 07/05/22   Blue, Soijett A, PA-C  Multiple Vitamins-Minerals (MULTIVITAMIN PO) Take 1 tablet by mouth daily.    [provider]  ondansetron (ZOFRAN) 4 MG tablet Take 1 tablet (4 mg total) by mouth every 6 (six) hours. 04/06/20   Rushie Chestnut, PA-C  OZEMPIC, 2 MG/DOSE, 8 MG/3ML SOPN Inject 2 mg into the skin once a week. 07/16/22   [provider]  promethazine-dextromethorphan (PROMETHAZINE-DM) 6.25-15 MG/5ML syrup Take 5 mLs by mouth 2 (two) times daily as needed for cough. 03/17/22   Raspet, Noberto Retort, PA-C  tamoxifen (NOLVADEX) 20 MG tablet Take 1 tablet (20 mg total) by mouth daily. 05/16/22   Serena Croissant, MD  Vitamin D, Ergocalciferol, (DRISDOL) 1.25 MG (50000 UNIT) CAPS capsule Take 50,000 Units by mouth once a week. 10/26/19   [provider]    Family History Family History  Problem Relation Age of Onset   Hypertension Mother    Diabetes Mother    COPD Mother    Macular degeneration Father    Glaucoma Father    Prostate cancer Father 58   Breast cancer Paternal Aunt    Prostate cancer Paternal Uncle    Breast cancer Paternal Grandmother        dx in her 33s   Glaucoma Paternal Grandfather     Prostate cancer Paternal Grandfather    Breast cancer Other        paternal grandmother's sister   Anesthesia problems Neg Hx    Amblyopia Neg Hx    Blindness Neg Hx    Cataracts Neg Hx    Retinal detachment Neg Hx    Strabismus Neg Hx    Retinitis pigmentosa Neg Hx     Social History Social History   Tobacco Use   Smoking status: Never   Smokeless tobacco: Never  Vaping Use   Vaping Use: Never used  Substance Use Topics  Alcohol use: Not Currently    Alcohol/week: 1.0 standard drink of alcohol    Types: 1 Cans of beer per week    Comment: occa   Drug use: No     Allergies   Shrimp extract, Shrimp [shellfish allergy], Aleve [naproxen], and Nickel   Review of Systems Review of Systems  HENT:  Positive for ear pain.      Physical Exam Triage Vital Signs ED Triage Vitals [08/31/22 1837]  Enc Vitals Group     BP (!) 146/90     Pulse Rate (!) 104     Resp 18     Temp 99.2 F (37.3 C)     Temp Source Oral     SpO2 98 %     Weight      Height      Head Circumference      Peak Flow      Pain Score 8     Pain Loc      Pain Edu?      Excl. in GC?    No data found.  Updated Vital Signs BP (!) 146/90 (BP Location: Left Arm)   Pulse (!) 104   Temp 99.2 F (37.3 C) (Oral)   Resp 18   LMP 05/27/2017 (Approximate)   SpO2 98%   Visual Acuity Right Eye Distance:   Left Eye Distance:   Bilateral Distance:    Right Eye Near:   Left Eye Near:    Bilateral Near:     Physical Exam   UC Treatments / Results  Labs (all labs ordered are listed, but only abnormal results are displayed) Labs Reviewed - No data to display  EKG   Radiology No results found.  Procedures Procedures (including critical care time)  Medications Ordered in UC Medications - No data to display  Initial Impression / Assessment and Plan / UC Course  I have reviewed the triage vital signs and the nursing notes.  Pertinent labs & imaging results that were available during  my care of the patient were reviewed by me and considered in my medical decision making (see chart for details).     *** Final Clinical Impressions(s) / UC Diagnoses   Final diagnoses:  None   Discharge Instructions   None    ED Prescriptions   None    PDMP not reviewed this encounter.

## 2022-08-31 NOTE — Discharge Instructions (Addendum)
Ear drops in the left ear twice daily for the next 5 days or so Use nasal spray such as flonase once daily. This should reduce any pressure behind the ear.   Please follow up with the ear specialists if symptoms are persisting.

## 2022-08-31 NOTE — ED Triage Notes (Signed)
Pt c/o lt ear pain and fullness since yesterday. State took ibuprofen last night. States her lt side of face hurts now.

## 2022-09-04 ENCOUNTER — Ambulatory Visit: Payer: Medicaid Other

## 2022-09-04 DIAGNOSIS — M25572 Pain in left ankle and joints of left foot: Secondary | ICD-10-CM

## 2022-09-04 DIAGNOSIS — M6281 Muscle weakness (generalized): Secondary | ICD-10-CM

## 2022-09-04 DIAGNOSIS — M25571 Pain in right ankle and joints of right foot: Secondary | ICD-10-CM

## 2022-09-04 DIAGNOSIS — R2689 Other abnormalities of gait and mobility: Secondary | ICD-10-CM

## 2022-09-04 NOTE — Therapy (Addendum)
 OUTPATIENT PHYSICAL THERAPY TREATMENT NOTE PHYSICAL THERAPY DISCHARGE SUMMARY  Visits from Start of Care: 12  Current functional level related to goals / functional outcomes: See goals below   Remaining deficits: Status unknown    Education / Equipment: N/a    Patient agrees to discharge. Patient goals were partially met. Patient is being discharged due to not returning since the last visit.   Patient Name: Sheryl Cox MRN: [id number] DOB:03-26-1970, 52 y.o., female Today's Date: 09/04/2022  PCP: Renaye Rakers, MD   REFERRING PROVIDER: Edwin Cap, DPM  END OF SESSION:   PT End of Session - 09/04/22 1022     Visit Number 12    Number of Visits 17    Date for PT Re-Evaluation 09/08/22    Authorization Type [employer] MCD; 27 VL    PT Start Time 1022   patient late   PT Stop Time 1100    PT Time Calculation (min) 38 min    Activity Tolerance Patient tolerated treatment well    Behavior During Therapy WFL for tasks assessed/performed                 Past Medical History:  Diagnosis Date   Anemia    history of blood transfusion-no abnormal reaction noted   Asthma    Albuterol as needed   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+, PR+.Takes Tamoxifen daily   Diabetes mellitus without complication (HCC)    takes Metformin daily   History of migraine    several yrs ago   Hypertension    takes Tribenzor daily   Past Surgical History:  Procedure Laterality Date   BIOPSY BREAST Left 12/05/12   fibroadenoma   BREAST LUMPECTOMY     2014   BREAST SURGERY Right    CESAREAN SECTION  01/05/2009   CHOLECYSTECTOMY N/A 01/31/2016   Procedure: LAPAROSCOPIC CHOLECYSTECTOMY;  Surgeon: Harriette Bouillon, MD;  Location: [facility];  Service: General;  Laterality: N/A;   DILITATION & CURRETTAGE/HYSTROSCOPY WITH HYDROTHERMAL ABLATION N/A 08/12/2013   Procedure: DILATATION & CURETTAGE/HYSTEROSCOPY WITH HYDROTHERMAL ABLATION;  Surgeon: Kathreen Cosier, MD;  Location: [facility];   Service: Gynecology;  Laterality: N/A;   ROBOTIC ASSISTED TOTAL HYSTERECTOMY N/A 06/18/2017   Procedure: XI ROBOTIC ASSISTED TOTAL HYSTERECTOMY;  Surgeon: Willodean Rosenthal, MD;  Location: [facility];  Service: Gynecology;  Laterality: N/A;   SALPINGOOPHORECTOMY Bilateral 06/18/2017   Procedure: BILATERAL SALPINGO OOPHORECTOMY;  Surgeon: Willodean Rosenthal, MD;  Location: [facility];  Service: Gynecology;  Laterality: Bilateral;   Patient Active Problem List   Diagnosis Date Noted   Iron deficiency anemia 05/29/2022   Osteoarthritis of acromioclavicular joint 02/13/2022   Neck pain 08/03/2020   Chronic pain of both knees 05/21/2018   Chronic right shoulder pain 05/21/2018   BMI 40.0-44.9, adult (HCC) 05/21/2018   Anemia 06/05/2013   Menorrhagia 12/30/2012   Hypertension 12/10/2012   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   Nabothian cyst 12/07/2011   Cervical mass 11/20/2011   Fibroid uterus 11/20/2011    REFERRING DIAG:  M76.72 (ICD-10-CM) - Peroneal tendinitis, left   THERAPY DIAG:  Pain in left ankle and joints of left foot  Muscle weakness (generalized)  Pain in right ankle and joints of right foot  Other abnormalities of gait and mobility  Rationale for Evaluation and Treatment Rehabilitation  PERTINENT HISTORY: Breast cancer Diabetes Hypertension   PRECAUTIONS: none  SUBJECTIVE:  SUBJECTIVE STATEMENT: "This side is still ya know. It's getting there."    PAIN:  Are you having pain? Yes: NPRS scale: 5/10  Pain location: Lt lateral ankle  Pain description: ache Aggravating factors: walking, standing, "wrong shoes" Relieving factors: soaking,rest    OBJECTIVE: (objective measures completed at initial evaluation unless otherwise dated)   DIAGNOSTIC FINDINGS:   Multiple views x-ray of both feet: no fracture, dislocation, swelling or degenerative changes noted, plantar calcaneal spur, and pes planus   PATIENT SURVEYS:  LEFS 25/80   COGNITION: Overall cognitive status: Within functional limits for tasks assessed                         SENSATION: WFL   EDEMA:  Mild swelling about Lt lateral ankle      POSTURE: Pes planus in standing   PALPATION: TTP bilateral plantar fascia, Lt peroneal tendons    LOWER EXTREMITY ROM:   Active ROM Right eval Left eval Left  07/26/22 08/03/22 Left  08/29/22 Right 08/29/22 Left  Hip flexion          Hip extension          Hip abduction          Hip adduction          Hip internal rotation          Hip external rotation          Knee flexion          Knee extension          Ankle dorsiflexion 2 Lacking 4 2 4 4 2   Ankle plantarflexion WNL WNL      Ankle inversion WNL WNL      Ankle eversion WNL WNL       (Blank rows = not tested)   LOWER EXTREMITY MMT:   MMT Right eval Left eval Right 08/29/22 Left 08/29/22  Hip flexion 5 5    Hip extension        Hip abduction 3+ 3+ 4- 4-  Hip adduction        Hip internal rotation        Hip external rotation        Knee flexion        Knee extension        Ankle dorsiflexion 5 4 4+ 4+  Ankle plantarflexion SL partial calf raise SL partial calf raise     Ankle inversion 4+ 4+ 4+ 4+  Ankle eversion 5 4+ 4+ 4+ p   (Blank rows = not tested) *Pain with all ankle MMT bilaterally*   LOWER EXTREMITY SPECIAL TESTS:  Windlass (-)    FUNCTIONAL TESTS:  SLS 3 second bilateral    GAIT: Distance walked: 10 ft  Assistive device utilized: None Level of assistance: Complete Independence Comments: foot flat initial contact, WBOS, excessive pronation, limited push-off   TREATMENT [facility] Adult PT Treatment:                                                DATE: 09/04/22 Therapeutic Exercise: Calf stretch on wedge x 1 minute  Standing toe raises 2 x 15   Standing calf raise 2 x 15  4 way ankle green band 2 x 10  Seated SL calf raise on step 2 x 10 ; 10 lbs  SLR 2 x 10     [facility] Adult PT Treatment:                                                DATE: 08/31/22 Aquatic therapy at [facility]- [address] - therapeutic pool temp 92 degrees Pt enters building ambulating independently  Treatment took place in water 3.8 to  4 ft 8 in.feet deep depending upon activity.  Pt entered and exited the pool via stair and handrails independently.    Therapeutic Exercise: Walking forward/backwards/side stepping x 2 laps each Walking march - 2 laps Toe walking - 2 laps Tandem walking - 2 laps holding white barbell Walking march with plantar flexion - 2 laps Heel/toe raise - 20x S/L heel raise - 2x20  Hip circles CW/CC - x10 ea BIL Squats - 2x20 Calf stretch on wall of pool Step up 2x10 ea fwd and lat   Pt requires the buoyancy of water for active assisted exercises with buoyancy supported for strengthening and AROM exercises. Hydrostatic pressure also supports joints by unweighting joint load by at least 50 % in 3-4 feet depth water. 80% in chest to neck deep water. Water will provide assistance with movement using the current and laminar flow while the buoyancy reduces weight bearing. Pt requires the viscosity of the water for resistance with strengthening exercises.   [facility] Adult PT Treatment:                                                DATE: 08/29/22 Therapeutic Exercise: MMT AROM DF Heel raise x 10 S/L leg heel raise - min lift each SLS 8 sec best  +HEP Tandem stance x 60 sec  [employer] broadcast assistant       PATIENT EDUCATION:  Education details: HEP review  Person educated: Patient Education method: Explanation Education comprehension: verbalized understanding   HOME EXERCISE PROGRAM: Access Code: [email or web address] URL: [email or web address]     ASSESSMENT:   CLINICAL IMPRESSION: Session somewhat  limited as patient was late for scheduled visit. Focused on progression of standing strengthening with fairly good tolerance as patient started feeling flush/overheated after about 10 minutes of standing activity. This subsided with water and seated rest break. Finished session with sitting and supine exercises with no issues. She does have difficulty controlling resisted eversion on the LLE having tendency to compensate at the knee and quickly fatigues with [place] hip strengthening. No reports of increased pain throughout session.     OBJECTIVE IMPAIRMENTS: Abnormal gait, decreased activity tolerance, decreased balance, decreased knowledge of condition, difficulty walking, decreased ROM, decreased strength, increased fascial restrictions, improper body mechanics, postural dysfunction, and pain.    ACTIVITY LIMITATIONS: carrying, lifting, bending, standing, squatting, stairs, transfers, and locomotion level   PARTICIPATION LIMITATIONS: meal prep, cleaning, laundry, shopping, community activity, and occupation   PERSONAL FACTORS: Age, Fitness, Profession, Time since onset of injury/illness/exacerbation, and 3+ comorbidities: see PMH above  are also affecting patient's functional outcome.    REHAB POTENTIAL: Fair chronicity of injury   CLINICAL DECISION MAKING: Stable/uncomplicated   EVALUATION COMPLEXITY: Low     GOALS: Goals reviewed with patient? Yes   SHORT TERM GOALS: Target date: 08/06/2022     Patient will  be independent and compliant with initial HEP.    Baseline: issued at eval  Goal status: met   2.  Patient will improve bilateral ankle DF AROM by at least 5 degrees to improve gait mechanics.  Baseline: see above  07/26/22: +2 08/29/22: Partially met for the left -see Chart Goal status: ONGOING   3.  Patient will be able to perform full range SL calf raise to improve push-off during gait cycle.  Baseline: see above  07/26/22: Min lift bilateral heel raise 08/29/22: Min left-  single heel raise, each Goal status: ONGOING     LONG TERM GOALS: Target date: 09/08/2022     Patient will maintain SLS for at least 8 seconds to improve gait stability.  Baseline: see above  08/29/22: right 6 sec, Left 8 sec  Goal status: PARTIALLY MET   2.  Patient will demonstrate pain free 5/5 bilateral ankle strength to improve stability when navigating on uneven terrain.  Baseline: see above  08/29/22: grossly 4+/5 except PF which is 2+ Goal status: ONGOING   3.  Patient will demonstrate at least 4/5 bilateral hip abductor strength to improve stability about the chain with prolonged walking/standing activity.  Baseline: see above  08/29/22: hip abduction improved to 4-/5 Goal status: ONGOING   4.  Patient will score at least 40/80 on the LEFS to signify clinically meaningful improvement in functional abilities.  Baseline: see above  Goal status: INITIAL   5.  Patient will report pain at worst rated as </= 5/10 to reduce her current functional limitations.  Baseline: see above  08/29/22: at worst 7/10 Goal status: ONGOING   6. Patient will complete floor transfer independently to improve ability to transfer in/out of her bath tub.             Baseline: unable            Goal status: UNABLE TO ASSESS         PLAN:   PT FREQUENCY: 1-2x/week   PT DURATION: 8 weeks   PLANNED INTERVENTIONS: Therapeutic exercises, Therapeutic activity, Neuromuscular re-education, Balance training, Gait training, Patient/Family education, Self Care, Joint mobilization, Aquatic Therapy, Dry Needling, Cryotherapy, Moist heat, Ionotophoresis 4mg /ml Dexamethasone, Manual therapy, and Re-evaluation   PLAN FOR NEXT SESSION: check LEFS and remaining goals over remainder of POC.  review and progress HEP, foot intrinsics, ankle/hip strengthening, calf stretching, consider TPDN gastroc  Letitia Libra, PT, DPT, ATC 09/04/22 11:00 AM  Letitia Libra, PT, DPT, ATC 05/22/23 3:06 PM

## 2022-09-06 ENCOUNTER — Ambulatory Visit: Payer: Medicaid Other

## 2022-09-11 ENCOUNTER — Encounter (INDEPENDENT_AMBULATORY_CARE_PROVIDER_SITE_OTHER): Payer: Self-pay | Admitting: Ophthalmology

## 2022-09-11 ENCOUNTER — Ambulatory Visit (INDEPENDENT_AMBULATORY_CARE_PROVIDER_SITE_OTHER): Payer: Medicaid Other | Admitting: Ophthalmology

## 2022-09-11 DIAGNOSIS — Z7984 Long term (current) use of oral hypoglycemic drugs: Secondary | ICD-10-CM

## 2022-09-11 DIAGNOSIS — H35033 Hypertensive retinopathy, bilateral: Secondary | ICD-10-CM

## 2022-09-11 DIAGNOSIS — E119 Type 2 diabetes mellitus without complications: Secondary | ICD-10-CM

## 2022-09-11 DIAGNOSIS — H25813 Combined forms of age-related cataract, bilateral: Secondary | ICD-10-CM

## 2022-09-11 DIAGNOSIS — I10 Essential (primary) hypertension: Secondary | ICD-10-CM

## 2022-09-11 DIAGNOSIS — H5213 Myopia, bilateral: Secondary | ICD-10-CM

## 2022-09-11 DIAGNOSIS — H43813 Vitreous degeneration, bilateral: Secondary | ICD-10-CM | POA: Diagnosis not present

## 2022-09-12 ENCOUNTER — Encounter (INDEPENDENT_AMBULATORY_CARE_PROVIDER_SITE_OTHER): Payer: Self-pay | Admitting: Ophthalmology

## 2022-09-16 ENCOUNTER — Encounter (HOSPITAL_BASED_OUTPATIENT_CLINIC_OR_DEPARTMENT_OTHER): Payer: Self-pay | Admitting: Emergency Medicine

## 2022-09-16 ENCOUNTER — Other Ambulatory Visit: Payer: Self-pay

## 2022-09-16 ENCOUNTER — Emergency Department (HOSPITAL_BASED_OUTPATIENT_CLINIC_OR_DEPARTMENT_OTHER)
Admission: EM | Admit: 2022-09-16 | Discharge: 2022-09-16 | Disposition: A | Discharge status: Home / Self Care | Payer: Medicaid Other | Attending: Emergency Medicine | Admitting: Emergency Medicine

## 2022-09-16 DIAGNOSIS — R531 Weakness: Secondary | ICD-10-CM | POA: Diagnosis not present

## 2022-09-16 DIAGNOSIS — R000 Tachycardia, unspecified: Secondary | ICD-10-CM | POA: Diagnosis not present

## 2022-09-16 DIAGNOSIS — K0889 Other specified disorders of teeth and supporting structures: Secondary | ICD-10-CM | POA: Diagnosis present

## 2022-09-16 LAB — CBC WITH DIFFERENTIAL/PLATELET
Abs Immature Granulocytes: 0.02 10*3/uL (ref 0.00–0.07)
Basophils Absolute: 0 10*3/uL (ref 0.0–0.1)
Basophils Relative: 0 %
Eosinophils Absolute: 0 10*3/uL (ref 0.0–0.5)
Eosinophils Relative: 1 %
HCT: 37.1 % (ref 36.0–46.0)
Hemoglobin: 11.8 g/dL — ABNORMAL LOW (ref 12.0–15.0)
Immature Granulocytes: 0 %
Lymphocytes Relative: 22 %
Lymphs Abs: 1.6 10*3/uL (ref 0.7–4.0)
MCH: 24.5 pg — ABNORMAL LOW (ref 26.0–34.0)
MCHC: 31.8 g/dL (ref 30.0–36.0)
MCV: 77 fL — ABNORMAL LOW (ref 80.0–100.0)
Monocytes Absolute: 0.3 10*3/uL (ref 0.1–1.0)
Monocytes Relative: 4 %
Neutro Abs: 5.4 10*3/uL (ref 1.7–7.7)
Neutrophils Relative %: 73 %
Platelets: 326 10*3/uL (ref 150–400)
RBC: 4.82 MIL/uL (ref 3.87–5.11)
RDW: 15.4 % (ref 11.5–15.5)
WBC: 7.4 10*3/uL (ref 4.0–10.5)
nRBC: 0 % (ref 0.0–0.2)

## 2022-09-16 LAB — URINALYSIS, W/ REFLEX TO CULTURE (INFECTION SUSPECTED)
Bilirubin Urine: NEGATIVE
Glucose, UA: NEGATIVE mg/dL
Hgb urine dipstick: NEGATIVE
Ketones, ur: NEGATIVE mg/dL
Leukocytes,Ua: NEGATIVE
Nitrite: NEGATIVE
Protein, ur: NEGATIVE mg/dL
Specific Gravity, Urine: <1.005 — ABNORMAL LOW (ref 1.005–1.030)
pH: 6 (ref 5.0–8.0)

## 2022-09-16 LAB — COMPREHENSIVE METABOLIC PANEL
ALT: 11 U/L (ref 0–44)
AST: 12 U/L — ABNORMAL LOW (ref 15–41)
Albumin: 4.3 g/dL (ref 3.5–5.0)
Alkaline Phosphatase: 32 U/L — ABNORMAL LOW (ref 38–126)
Anion gap: 9 (ref 5–15)
BUN: 14 mg/dL (ref 6–20)
CO2: 29 mmol/L (ref 22–32)
Calcium: 9.6 mg/dL (ref 8.9–10.3)
Chloride: 100 mmol/L (ref 98–111)
Creatinine, Ser: 0.79 mg/dL (ref 0.44–1.00)
GFR, Estimated: >60 mL/min (ref >60)
Glucose, Bld: 92 mg/dL (ref 70–99)
Potassium: 3.8 mmol/L (ref 3.5–5.1)
Sodium: 138 mmol/L (ref 135–145)
Total Bilirubin: 0.3 mg/dL (ref 0.3–1.2)
Total Protein: 7.9 g/dL (ref 6.5–8.1)

## 2022-09-16 MED ORDER — AMOXICILLIN-POT CLAVULANATE 875-125 MG PO TABS: 1.0000 | ORAL_TABLET | Freq: Two times a day (BID) | ORAL | 0 refills | Status: AC

## 2022-09-16 MED ORDER — ONDANSETRON 4 MG PO TBDP
8.0000 mg | ORAL_TABLET | Freq: Once | ORAL | Status: AC
  Administered 2022-09-16: 8 mg via ORAL
  Filled 2022-09-16: qty 2

## 2022-09-16 NOTE — ED Notes (Signed)
Discharge paperwork given and verbally understood. 

## 2022-09-16 NOTE — ED Provider Notes (Signed)
[place] EMERGENCY DEPARTMENT AT [facility] Provider Note   CSN: [id number] Arrival date & time: 09/16/22  1240     History  No chief complaint on file.   Sheryl Cox is a 52 y.o. female who presents to the ED today for weakness and dental pain.  Patient reports that this morning she woke up feeling lightheaded and weak. She reports feeling lightheaded with movement and at rest.  Additionally, she reports that she has had dental pain for the past several weeks.  She reports pain in her right-sided upper and lower molars.  Patient says that she needs to get her wisdom teeth out but does not have a dentist.  Additionally she reports that she was flossing her teeth and made her gums bleed.  She thinks she might have an infection since she has noticed some swelling and pain to the right jaw. She denies fever, chills, dizziness, nausea, or vomiting.  No other complaints or concerns at this time.    Home Medications Prior to Admission medications   Medication Sig Start Date End Date Taking? Authorizing Provider  amoxicillin-clavulanate (AUGMENTIN) 875-125 MG tablet Take 1 tablet by mouth every 12 (twelve) hours for 7 days. 09/16/22 09/23/22 Yes Maxwell Marion, PA-C  acetaminophen (TYLENOL) 325 MG tablet Take 650 mg by mouth daily as needed for headache.    [provider]  acetaminophen-codeine 120-12 MG/5ML solution TAKE BY MOUTH AT BEDTIME AS NEEDED    [provider]  albuterol (VENTOLIN HFA) 108 (90 Base) MCG/ACT inhaler Inhale 1-2 puffs into the lungs every 6 (six) hours as needed for wheezing or shortness of breath. 09/21/19   Wieters, Hallie C, PA-C  ALPRAZolam (XANAX) 0.25 MG tablet TAKE 1 TABLET BY MOUTH TWICE A DAY AS NEEDED FOR ANXIETY 08/25/18   Renaye Rakers, MD  amLODipine (NORVASC) 10 MG tablet Take 10 mg by mouth daily. 02/23/17   [provider]  Ascorbic Acid (VITAMIN C PO) Take by mouth.    [provider]  celecoxib (CELEBREX) 200  MG capsule Take 1 capsule (200 mg total) by mouth 2 (two) times daily. 10/20/19   Arthor Captain, PA-C  Cholecalciferol 1.25 MG (50000 UT) capsule  04/20/21   [provider]  cyclobenzaprine (FLEXERIL) 10 MG tablet Take 0.5-1 tablets (5-10 mg total) by mouth 2 (two) times daily as needed for muscle spasms. 10/20/19   Arthor Captain, PA-C  diclofenac Sodium (VOLTAREN) 1 % GEL Apply 4 g topically 4 (four) times daily. 07/30/20   Fondaw, Wylder S, PA  ELDERBERRY PO Take by mouth.    [provider]  erythromycin ophthalmic ointment Place a 1/2 inch ribbon of ointment into the lower eyelid. 09/18/21   Ward, Tylene Fantasia, PA-C  fluticasone (FLONASE) 50 MCG/ACT nasal spray Place 1 spray into both nostrils daily. 03/17/22   Raspet, Noberto Retort, PA-C  hydrochlorothiazide (HYDRODIURIL) 12.5 MG tablet Take 2 tablets (25 mg total) by mouth daily. Patient taking differently: Take 12.5 mg by mouth daily. 05/14/18   Bing Neighbors, NP  meclizine (ANTIVERT) 12.5 MG tablet Take 1 tablet (12.5 mg total) by mouth 3 (three) times daily as needed for dizziness. 03/25/21   Rushie Chestnut, PA-C  metFORMIN (GLUCOPHAGE) 500 MG tablet Take 1 tablet (500 mg total) by mouth 2 (two) times daily with a meal. Patient taking differently: Take 500 mg by mouth daily. 06/19/17   Willodean Rosenthal, MD  methocarbamol (ROBAXIN) 500 MG tablet Take 1 tablet (500 mg total) by  mouth 2 (two) times daily. 07/05/22   Blue, Soijett A, PA-C  Multiple Vitamins-Minerals (MULTIVITAMIN PO) Take 1 tablet by mouth daily.    [provider]  ondansetron (ZOFRAN) 4 MG tablet Take 1 tablet (4 mg total) by mouth every 6 (six) hours. 04/06/20   Rushie Chestnut, PA-C  OZEMPIC, 2 MG/DOSE, 8 MG/3ML SOPN Inject 2 mg into the skin once a week. 07/16/22   [provider]  promethazine-dextromethorphan (PROMETHAZINE-DM) 6.25-15 MG/5ML syrup Take 5 mLs by mouth 2 (two) times daily as needed for cough. 03/17/22   Raspet, Noberto Retort, PA-C   tamoxifen (NOLVADEX) 20 MG tablet Take 1 tablet (20 mg total) by mouth daily. 05/16/22   Serena Croissant, MD  Vitamin D, Ergocalciferol, (DRISDOL) 1.25 MG (50000 UNIT) CAPS capsule Take 50,000 Units by mouth once a week. 10/26/19   [provider]      Allergies    Shrimp extract, Shrimp [shellfish allergy], Aleve [naproxen], and Nickel    Review of Systems   Review of Systems  HENT:  Positive for dental problem.   Neurological:  Positive for weakness.  All other systems reviewed and are negative.   Physical Exam Updated Vital Signs BP (!) 147/94   Pulse (!) 119   Temp 98.7 F (37.1 C) (Oral)   Resp (!) 21   LMP 05/27/2017 (Approximate)   SpO2 96%  Physical Exam Vitals and nursing note reviewed.  Constitutional:      Appearance: Normal appearance.  HENT:     Head: Normocephalic and atraumatic.     Comments: Tenderness to palpation of left jaw with minimal swelling.    Right Ear: Tympanic membrane, ear canal and external ear normal.     Left Ear: Tympanic membrane, ear canal and external ear normal.     Mouth/Throat:     Mouth: Mucous membranes are moist.     Pharynx: No oropharyngeal exudate or posterior oropharyngeal erythema.     Comments: Pain to palpation of teeth 31 and 32 with gingival inflammation present at the posterior aspect of tooth 32. No submandibular swelling or tenderness. Eyes:     Conjunctiva/sclera: Conjunctivae normal.     Pupils: Pupils are equal, round, and reactive to light.  Cardiovascular:     Rate and Rhythm: Normal rate and regular rhythm.     Pulses: Normal pulses.     Heart sounds: Normal heart sounds.  Pulmonary:     Effort: Pulmonary effort is normal.     Breath sounds: Normal breath sounds.  Abdominal:     Palpations: Abdomen is soft.     Tenderness: There is no abdominal tenderness.  Musculoskeletal:     Cervical back: No tenderness.     Right lower leg: No edema.     Left lower leg: No edema.  Lymphadenopathy:     Cervical:  No cervical adenopathy.  Skin:    General: Skin is warm and dry.     Findings: No rash.  Neurological:     General: No focal deficit present.     Mental Status: She is alert.     Sensory: No sensory deficit.     Motor: No weakness.  Psychiatric:        Mood and Affect: Mood normal.        Behavior: Behavior normal.   Orthostatic Lying BP- Lying: 165/90 Pulse- Lying: 110 Orthostatic Sitting BP- Sitting: 143/98 Abnormal  Pulse- Sitting: 114 Orthostatic Standing at 0 minutes BP- Standing at 0 minutes: 147/94 Abnormal  Pulse- Standing at 0 minutes: 117 ED Results / Procedures / Treatments   Labs (all labs ordered are listed, but only abnormal results are displayed) Labs Reviewed  COMPREHENSIVE METABOLIC PANEL - Abnormal; Notable for the following components:      Result Value   AST 12 (*)    Alkaline Phosphatase 32 (*)    All other components within normal limits  CBC WITH DIFFERENTIAL/PLATELET - Abnormal; Notable for the following components:   Hemoglobin 11.8 (*)    MCV 77.0 (*)    MCH 24.5 (*)    All other components within normal limits  URINALYSIS, W/ REFLEX TO CULTURE (INFECTION SUSPECTED) - Abnormal; Notable for the following components:   Color, Urine COLORLESS (*)    Specific Gravity, Urine <1.005 (*)    Bacteria, UA RARE (*)    All other components within normal limits    EKG EKG Interpretation Date/Time:  Sunday September 16 2022 12:52:47 EDT Ventricular Rate:  107 PR Interval:  157 QRS Duration:  88 QT Interval:  336 QTC Calculation: 449 R Axis:   68  Text Interpretation: Sinus tachycardia Multiform ventricular premature complexes No acute changes No significant change since last tracing Confirmed by Derwood Kaplan ([id number]) on 09/16/2022 1:06:19 PM  Radiology No results found.  Procedures Procedures: not indicated.   Medications Ordered in ED Medications  ondansetron (ZOFRAN-ODT) disintegrating tablet 8 mg (8 mg Oral Given 09/16/22 1353)    ED  Course/ Medical Decision Making/ A&P                             Medical Decision Making Amount and/or Complexity of Data Reviewed Labs: ordered.  Risk Prescription drug management.   Patient is a 52 year old female who presents to the ED today for dental pain and lightheadedness.  Patient reports that the dental pain has been going on for a while now but recently was flossing which caused her gums to bleed. She is now concerned about infection and states that she also needs her wisdom teeth removed.  She reports tenderness and swelling to the right side of her jaw. Patient is not established with a dentist. Additionally she reports that this morning she woke up and she felt weak, lightheaded, and nauseous. Patient feels this way both with movement and at rest.  Denies fever or dizziness. My differentials include: pulpitis vs gingivitis vs dental abscess vs Ludwig's angina vs dehydration vs anemia, etc. Social determinates of health include: access to health care.  On physical exam, patient has tenderness to palpation of her right lower jaw as well as tenderness to palpation of teeth 31 and 32 in the back right corner of her mouth.  She is afebrile and in no acute distress.  No broken or missing teeth.  There is inflammation of her gums, behind tooth #32.  There is no swelling or induration appreciated on palpation of the hard or soft palate, low suspicion for Ludwig's angina.  No tonsillar erythema or edema.  Patient is able to ambulate on her own.  Sensation was intact in upper and lower extremities as well as strength.  EKG showed sinus tachycardia with rate of 107 bpm.  No significant changes from previous EKGs.  Orthostatic vital signs were obtained and noted above in physical exam.  There was a 20 point change in systolic blood pressure when patient went from laying to sitting but no significant changes in blood pressure when patient went  from sitting to standing.  There was no significant  changes and heart rate throughout the exam.  I ordered and interpreted labs.  CMP, CBC, and UA were all within normal limits. No acute anemia, infection, electrolyte abnormality, or AKI noted.  No urinary infection noted as well.  ODT Zofran was given for nausea.  On reevaluation she reported relief of symptoms with this medication.   I discussed the results with the patient.  I encouraged her to make sure is drinking enough fluids with electrolytes which could help with her lightheadedness.  Patient prescribed Augmentin to take twice a day for 7 days for possible dental infection.  Advised to alternate between ibuprofen and Tylenol for pain and swelling of the gums.  Resources were provided for patient to get established with a dentist in this area.  Patient is stable and safe for discharge home.  Return precautions given.       Final Clinical Impression(s) / ED Diagnoses Final diagnoses:  Pain, dental    Rx / DC Orders ED Discharge Orders          Ordered    amoxicillin-clavulanate (AUGMENTIN) 875-125 MG tablet  Every 12 hours        09/16/22 1530              Maxwell Marion, PA-C 09/17/22 1346    Derwood Kaplan, MD 09/23/22 1441

## 2022-09-16 NOTE — Discharge Instructions (Addendum)
As discussed, your lab results are unremarkable.  Take Augmentin twice a day for 7 days to ensure resolution of dental infection.  Dental resource guide provided.  You can call and get seen by any doctor on the sheet.  You can alternate between Tylenol and ibuprofen every 4 hours for pain.  Make sure you stay hydrated and drink plenty of  fluids.  Can help with the feeling lightheaded and weak.  Follow-up with your primary care doctor in 1 week for reevaluation.  If you do not have a primary care provider there is a phone number that he can call and discharge paperwork that we will help you to establish with one.  Get help right away if: You are unable to open your mouth. You are having trouble breathing or swallowing. You have a fever. You notice that your face, neck, or jaw is swollen.

## 2022-09-16 NOTE — ED Triage Notes (Signed)
Right side face been hurting for few weeks, not sure if infected or not.

## 2022-09-16 NOTE — ED Triage Notes (Signed)
Pt reports diarrhea this am, pt took nap, woke up feeling weak, dizzy. No abdominal pain, no respiratory symptoms

## 2022-12-21 ENCOUNTER — Emergency Department (HOSPITAL_BASED_OUTPATIENT_CLINIC_OR_DEPARTMENT_OTHER): Payer: Medicaid Other

## 2022-12-21 ENCOUNTER — Emergency Department (HOSPITAL_BASED_OUTPATIENT_CLINIC_OR_DEPARTMENT_OTHER)
Admission: EM | Admit: 2022-12-21 | Discharge: 2022-12-21 | Disposition: A | Discharge status: Home / Self Care | Payer: Medicaid Other | Attending: Emergency Medicine | Admitting: Emergency Medicine

## 2022-12-21 ENCOUNTER — Encounter (HOSPITAL_BASED_OUTPATIENT_CLINIC_OR_DEPARTMENT_OTHER): Payer: Self-pay

## 2022-12-21 ENCOUNTER — Other Ambulatory Visit: Payer: Self-pay

## 2022-12-21 DIAGNOSIS — R079 Chest pain, unspecified: Secondary | ICD-10-CM

## 2022-12-21 DIAGNOSIS — R0789 Other chest pain: Secondary | ICD-10-CM | POA: Diagnosis present

## 2022-12-21 DIAGNOSIS — Z853 Personal history of malignant neoplasm of breast: Secondary | ICD-10-CM | POA: Insufficient documentation

## 2022-12-21 DIAGNOSIS — I10 Essential (primary) hypertension: Secondary | ICD-10-CM | POA: Insufficient documentation

## 2022-12-21 DIAGNOSIS — Z79899 Other long term (current) drug therapy: Secondary | ICD-10-CM | POA: Insufficient documentation

## 2022-12-21 DIAGNOSIS — R000 Tachycardia, unspecified: Secondary | ICD-10-CM | POA: Insufficient documentation

## 2022-12-21 DIAGNOSIS — Z7984 Long term (current) use of oral hypoglycemic drugs: Secondary | ICD-10-CM | POA: Insufficient documentation

## 2022-12-21 DIAGNOSIS — E119 Type 2 diabetes mellitus without complications: Secondary | ICD-10-CM | POA: Diagnosis not present

## 2022-12-21 LAB — URINALYSIS, W/ REFLEX TO CULTURE (INFECTION SUSPECTED)
Bilirubin Urine: NEGATIVE
Glucose, UA: NEGATIVE mg/dL
Hgb urine dipstick: NEGATIVE
Ketones, ur: NEGATIVE mg/dL
Leukocytes,Ua: NEGATIVE
Nitrite: NEGATIVE
Protein, ur: NEGATIVE mg/dL
Specific Gravity, Urine: 1.007 (ref 1.005–1.030)
pH: 6.5 (ref 5.0–8.0)

## 2022-12-21 LAB — BASIC METABOLIC PANEL
Anion gap: 8 (ref 5–15)
BUN: 13 mg/dL (ref 6–20)
CO2: 31 mmol/L (ref 22–32)
Calcium: 9.7 mg/dL (ref 8.9–10.3)
Chloride: 99 mmol/L (ref 98–111)
Creatinine, Ser: 0.83 mg/dL (ref 0.44–1.00)
GFR, Estimated: >60 mL/min (ref >60)
Glucose, Bld: 124 mg/dL — ABNORMAL HIGH (ref 70–99)
Potassium: 3.5 mmol/L (ref 3.5–5.1)
Sodium: 138 mmol/L (ref 135–145)

## 2022-12-21 LAB — CBC
HCT: 37.8 % (ref 36.0–46.0)
Hemoglobin: 11.8 g/dL — ABNORMAL LOW (ref 12.0–15.0)
MCH: 24 pg — ABNORMAL LOW (ref 26.0–34.0)
MCHC: 31.2 g/dL (ref 30.0–36.0)
MCV: 76.8 fL — ABNORMAL LOW (ref 80.0–100.0)
Platelets: 335 10*3/uL (ref 150–400)
RBC: 4.92 MIL/uL (ref 3.87–5.11)
RDW: 15.9 % — ABNORMAL HIGH (ref 11.5–15.5)
WBC: 6.1 10*3/uL (ref 4.0–10.5)
nRBC: 0 % (ref 0.0–0.2)

## 2022-12-21 LAB — TROPONIN I (HIGH SENSITIVITY)
Troponin I (High Sensitivity): 2 ng/L (ref <18)
Troponin I (High Sensitivity): 5 ng/L (ref <18)

## 2022-12-21 LAB — D-DIMER, QUANTITATIVE: D-Dimer, Quant: <0.27 ug{FEU}/mL (ref 0.00–0.50)

## 2022-12-21 MED ORDER — ASPIRIN 81 MG PO CHEW
324.0000 mg | CHEWABLE_TABLET | Freq: Once | ORAL | Status: AC
  Administered 2022-12-21: 324 mg via ORAL
  Filled 2022-12-21: qty 4

## 2022-12-21 NOTE — ED Provider Notes (Signed)
[facility] Provider Note   CSN: [id number] Arrival date & time: 12/21/22  7253     History  Chief Complaint  Patient presents with   Chest Pain    Sheryl Cox is a 52 y.o. female.   Chest Pain    Patient has a history of diabetes, hypertension, breast cancer, migraines, anemia who presents to the ED for evaluation of chest pain.  Patient states she started having symptoms a few days ago.  She has noticed a pressure in her chest that goes towards her back as well as her neck.  She states the symptoms lasted a few minutes at a time.  It gets worse with activity as well as breathing.  She has had a slight cough recently.  She has been feeling some shortness of breath with activity.  No known history of heart disease.  No known history of PE or DVT.  Home Medications Prior to Admission medications   Medication Sig Start Date End Date Taking? Authorizing Provider  acetaminophen (TYLENOL) 325 MG tablet Take 650 mg by mouth daily as needed for headache.    [provider]  acetaminophen-codeine 120-12 MG/5ML solution TAKE BY MOUTH AT BEDTIME AS NEEDED    [provider]  albuterol (VENTOLIN HFA) 108 (90 Base) MCG/ACT inhaler Inhale 1-2 puffs into the lungs every 6 (six) hours as needed for wheezing or shortness of breath. 09/21/19   Wieters, Hallie C, PA-C  ALPRAZolam (XANAX) 0.25 MG tablet TAKE 1 TABLET BY MOUTH TWICE A DAY AS NEEDED FOR ANXIETY 08/25/18   Renaye Rakers, MD  amLODipine (NORVASC) 10 MG tablet Take 10 mg by mouth daily. 02/23/17   [provider]  Ascorbic Acid (VITAMIN C PO) Take by mouth.    [provider]  celecoxib (CELEBREX) 200 MG capsule Take 1 capsule (200 mg total) by mouth 2 (two) times daily. 10/20/19   Arthor Captain, PA-C  Cholecalciferol 1.25 MG (50000 UT) capsule  04/20/21   [provider]  cyclobenzaprine (FLEXERIL) 10 MG tablet Take 0.5-1 tablets (5-10 mg total) by  mouth 2 (two) times daily as needed for muscle spasms. 10/20/19   Arthor Captain, PA-C  diclofenac Sodium (VOLTAREN) 1 % GEL Apply 4 g topically 4 (four) times daily. 07/30/20   Fondaw, Wylder S, PA  ELDERBERRY PO Take by mouth.    [provider]  erythromycin ophthalmic ointment Place a 1/2 inch ribbon of ointment into the lower eyelid. 09/18/21   Ward, Tylene Fantasia, PA-C  fluticasone (FLONASE) 50 MCG/ACT nasal spray Place 1 spray into both nostrils daily. 03/17/22   Raspet, Noberto Retort, PA-C  hydrochlorothiazide (HYDRODIURIL) 12.5 MG tablet Take 2 tablets (25 mg total) by mouth daily. Patient taking differently: Take 12.5 mg by mouth daily. 05/14/18   Bing Neighbors, NP  meclizine (ANTIVERT) 12.5 MG tablet Take 1 tablet (12.5 mg total) by mouth 3 (three) times daily as needed for dizziness. 03/25/21   Rushie Chestnut, PA-C  metFORMIN (GLUCOPHAGE) 500 MG tablet Take 1 tablet (500 mg total) by mouth 2 (two) times daily with a meal. Patient taking differently: Take 500 mg by mouth daily. 06/19/17   Willodean Rosenthal, MD  methocarbamol (ROBAXIN) 500 MG tablet Take 1 tablet (500 mg total) by mouth 2 (two) times daily. 07/05/22   Blue, Soijett A, PA-C  Multiple Vitamins-Minerals (MULTIVITAMIN PO) Take 1 tablet by mouth daily.    [provider]  ondansetron (ZOFRAN) 4 MG tablet  Take 1 tablet (4 mg total) by mouth every 6 (six) hours. 04/06/20   Rushie Chestnut, PA-C  OZEMPIC, 2 MG/DOSE, 8 MG/3ML SOPN Inject 2 mg into the skin once a week. 07/16/22   [provider]  promethazine-dextromethorphan (PROMETHAZINE-DM) 6.25-15 MG/5ML syrup Take 5 mLs by mouth 2 (two) times daily as needed for cough. 03/17/22   Raspet, Noberto Retort, PA-C  tamoxifen (NOLVADEX) 20 MG tablet Take 1 tablet (20 mg total) by mouth daily. 05/16/22   Serena Croissant, MD  Vitamin D, Ergocalciferol, (DRISDOL) 1.25 MG (50000 UNIT) CAPS capsule Take 50,000 Units by mouth once a week. 10/26/19   [provider]       Allergies    Shrimp extract, Shrimp [shellfish allergy], Aleve [naproxen], and Nickel    Review of Systems   Review of Systems  Cardiovascular:  Positive for chest pain.    Physical Exam Updated Vital Signs BP (!) 156/101 (BP Location: Right Arm)   Pulse (!) 113   Temp 98.8 F (37.1 C) (Oral)   Resp 20   Ht 1.6 m (5\' 3" )   Wt 117.9 kg   LMP 05/27/2017 (Approximate)   SpO2 100%   BMI 46.06 kg/m  Physical Exam Vitals and nursing note reviewed.  Constitutional:      Appearance: She is well-developed. She is not diaphoretic.     Comments: Increased BMI  HENT:     Head: Normocephalic and atraumatic.     Right Ear: External ear normal.     Left Ear: External ear normal.  Eyes:     General: No scleral icterus.       Right eye: No discharge.        Left eye: No discharge.     Conjunctiva/sclera: Conjunctivae normal.  Neck:     Trachea: No tracheal deviation.  Cardiovascular:     Rate and Rhythm: Normal rate and regular rhythm.  Pulmonary:     Effort: Pulmonary effort is normal. No respiratory distress.     Breath sounds: Normal breath sounds. No stridor. No wheezing or rales.  Abdominal:     General: Bowel sounds are normal. There is no distension.     Palpations: Abdomen is soft.     Tenderness: There is no abdominal tenderness. There is no guarding or rebound.  Musculoskeletal:        General: No tenderness or deformity.     Cervical back: Neck supple.  Skin:    General: Skin is warm and dry.     Findings: No rash.  Neurological:     General: No focal deficit present.     Mental Status: She is alert.     Cranial Nerves: No cranial nerve deficit, dysarthria or facial asymmetry.     Sensory: No sensory deficit.     Motor: No abnormal muscle tone or seizure activity.     Coordination: Coordination normal.  Psychiatric:        Mood and Affect: Mood normal.     ED Results / Procedures / Treatments   Labs (all labs ordered are listed, but only abnormal  results are displayed) Labs Reviewed  BASIC METABOLIC PANEL - Abnormal; Notable for the following components:      Result Value   Glucose, Bld 124 (*)    All other components within normal limits  CBC - Abnormal; Notable for the following components:   Hemoglobin 11.8 (*)    MCV 76.8 (*)    MCH 24.0 (*)  RDW 15.9 (*)    All other components within normal limits  URINALYSIS, W/ REFLEX TO CULTURE (INFECTION SUSPECTED) - Abnormal; Notable for the following components:   Color, Urine COLORLESS (*)    Bacteria, UA RARE (*)    All other components within normal limits  D-DIMER, QUANTITATIVE  TROPONIN I (HIGH SENSITIVITY)  TROPONIN I (HIGH SENSITIVITY)    EKG EKG Interpretation Date/Time:  Friday December 21 2022 07:43:32 EDT Ventricular Rate:  112 PR Interval:  136 QRS Duration:  85 QT Interval:  372 QTC Calculation: 508 R Axis:   60  Text Interpretation: Sinus tachycardia Since last tracing rate faster Confirmed by Linwood Dibbles [phone]) on 12/21/2022 8:00:20 AM  Radiology DG Chest Portable 1 View  Result Date: 12/21/2022 CLINICAL DATA:  Left-sided chest pain.  Described as pressure. EXAM: PORTABLE CHEST 1 VIEW COMPARISON:  05/24/2021. FINDINGS: Bilateral lung fields are clear. Bilateral lateral costophrenic angles are clear. Normal cardio-mediastinal silhouette. No acute osseous abnormalities. The soft tissues are within normal limits. IMPRESSION: 1. No active disease. Electronically Signed   By: Jules Schick M.D.   On: 12/21/2022 08:00    Procedures Procedures    Medications Ordered in ED Medications  aspirin chewable tablet 324 mg (324 mg Oral Given 12/21/22 0817)    ED Course/ Medical Decision Making/ A&P Clinical Course as of 12/21/22 1116  Fri Dec 21, 2022  1478 CBC shows anemia of 11.8 [place] previous values.  D-dimer normal [JK]  0843 Chest x-ray without acute findings [JK]  0851 Initial troponin normal. [JK]  1049 Delta troponin is normal [JK]  1059 Troponin I  (High Sensitivity) [JK]    Clinical Course User Index [JK] Linwood Dibbles, MD             HEART Score: 3                    Medical Decision Making Frontal diagnosis includes but not limited to acute coronary syndrome, pneumonia, pneumothorax, pulmonary embolism,  Problems Addressed: Chest pain, unspecified type: acute illness or injury that poses a threat to life or bodily functions  Amount and/or Complexity of Data Reviewed Labs: ordered. Decision-making details documented in ED Course. Radiology: ordered and independent interpretation performed.  Risk OTC drugs.   Patient presented to the ED for evaluation of chest pain.  ED workup reassuring.  Low risk heart score.  Serial troponins normal.  Doubt acute coronary syndrome.  Chest x-ray without evidence of pneumonia or pneumothorax.  Low suspicion for PE.  D-dimer negative, no pulmonary embolism.  Etiology of her symptoms unclear but overall reassuring workup in the ED.  I do think she would benefit from outpatient cardiology follow-up considering her symptoms and risk factors  Evaluation and diagnostic testing in the emergency department does not suggest an emergent condition requiring admission or immediate intervention beyond what has been performed at this time.  The patient is safe for discharge and has been instructed to return immediately for worsening symptoms, change in symptoms or any other concerns.        Final Clinical Impression(s) / ED Diagnoses Final diagnoses:  Chest pain, unspecified type    Rx / DC Orders ED Discharge Orders          Ordered    Ambulatory referral to Cardiology       Comments: If you have not heard from the Cardiology office within the next 72 hours please call [phone].   12/21/22 1114  Linwood Dibbles, MD 12/21/22 [phone]

## 2022-12-21 NOTE — ED Triage Notes (Signed)
Pt POV from home c/o left sided CP that began "a few days ago" while at work. Described as pressure. Denies radiation. Denies NVD. Endorses ShOB.

## 2022-12-21 NOTE — Discharge Instructions (Signed)
The test today in the ED were reassuring.  However the exact cause is not known.  Follow-up with your cardiologist for further evaluation.  Return to the ER for any recurrent episodes

## 2022-12-23 ENCOUNTER — Other Ambulatory Visit: Payer: Self-pay | Admitting: Hematology and Oncology

## 2023-01-01 ENCOUNTER — Encounter (HOSPITAL_COMMUNITY): Payer: Self-pay | Admitting: *Deleted

## 2023-01-01 ENCOUNTER — Ambulatory Visit (HOSPITAL_COMMUNITY)
Admission: EM | Admit: 2023-01-01 | Discharge: 2023-01-01 | Disposition: A | Discharge status: Home / Self Care | Payer: Medicaid Other | Attending: Emergency Medicine | Admitting: Emergency Medicine

## 2023-01-01 DIAGNOSIS — M545 Low back pain, unspecified: Secondary | ICD-10-CM | POA: Diagnosis not present

## 2023-01-01 LAB — POCT URINALYSIS DIP (MANUAL ENTRY)
Bilirubin, UA: NEGATIVE
Glucose, UA: NEGATIVE mg/dL
Ketones, POC UA: NEGATIVE mg/dL
Leukocytes, UA: NEGATIVE
Nitrite, UA: NEGATIVE
Protein Ur, POC: NEGATIVE mg/dL
Spec Grav, UA: 1.015 (ref 1.010–1.025)
Urobilinogen, UA: 0.2 U/dL
pH, UA: 6 (ref 5.0–8.0)

## 2023-01-01 MED ORDER — METHOCARBAMOL 500 MG PO TABS: 500.0000 mg | ORAL_TABLET | Freq: Two times a day (BID) | ORAL | 0 refills | Status: AC | PRN

## 2023-01-01 MED ORDER — DEXAMETHASONE SODIUM PHOSPHATE 10 MG/ML IJ SOLN
INTRAMUSCULAR | Status: AC
  Filled 2023-01-01: qty 1

## 2023-01-01 MED ORDER — DEXAMETHASONE SODIUM PHOSPHATE 10 MG/ML IJ SOLN: 10.0000 mg | Freq: Once | INTRAMUSCULAR | Status: DC

## 2023-01-01 NOTE — Discharge Instructions (Signed)
You can take her back seen twice daily as needed for pain and spasms.  He is can make you drowsy so do not work, drive, or drink alcohol while taking this.  Otherwise she can alternate between ibuprofen and Tylenol as needed for pain.  You can also alternate between heat and ice for pain.  If you symptoms persist you can return here for reevaluation, follow-up with primary care, or go to [facility] for evaluation.  If you develop severe back pain, abdominal pain, fever, or blood in urine please seek immediate medical treatment in the ER.

## 2023-01-01 NOTE — ED Provider Notes (Signed)
[facility]    CSN: [id number] Arrival date & time: 01/01/23  1828      History   Chief Complaint Chief Complaint  Patient presents with   Back Pain    HPI Sheryl Cox is a 52 y.o. female.   Patient presents with bilateral low back pain that began today around 3 PM.  Patient reports taking ibuprofen without relief.  Patient denies any known injury.  Denies dysuria, hematuria, fever, abdominal pain, numbness, and weakness.   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   Neck pain 08/03/2020   Chronic pain of both knees 05/21/2018   Chronic right shoulder pain 05/21/2018   BMI 40.0-44.9, adult (HCC) 05/21/2018   Anemia 06/05/2013   Menorrhagia 12/30/2012   Hypertension 12/10/2012   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   Nabothian cyst 12/07/2011   Cervical mass 11/20/2011   Fibroid uterus 11/20/2011    Past Surgical History:  Procedure Laterality Date   BIOPSY BREAST Left 12/05/12   fibroadenoma   BREAST LUMPECTOMY     2014   BREAST SURGERY Right    CESAREAN SECTION  01/05/2009   CHOLECYSTECTOMY N/A 01/31/2016   Procedure: LAPAROSCOPIC CHOLECYSTECTOMY;  Surgeon: Harriette Bouillon, MD;  Location: [facility] OR;  Service: General;  Laterality: N/A;   DILITATION & CURRETTAGE/HYSTROSCOPY WITH HYDROTHERMAL ABLATION N/A 08/12/2013   Procedure: DILATATION &  CURETTAGE/HYSTEROSCOPY WITH HYDROTHERMAL ABLATION;  Surgeon: Kathreen Cosier, MD;  Location: [facility];  Service: Gynecology;  Laterality: N/A;   ROBOTIC ASSISTED TOTAL HYSTERECTOMY N/A 06/18/2017   Procedure: XI ROBOTIC ASSISTED TOTAL HYSTERECTOMY;  Surgeon: Willodean Rosenthal, MD;  Location: [facility];  Service: Gynecology;  Laterality: N/A;   SALPINGOOPHORECTOMY Bilateral 06/18/2017   Procedure: BILATERAL SALPINGO OOPHORECTOMY;  Surgeon: Willodean Rosenthal, MD;  Location: [facility];  Service: Gynecology;  Laterality: Bilateral;    OB History     Gravida  1   Para  1   Term  1   Preterm  0   AB  0   Living  1      SAB  0   IAB  0   Ectopic  0   Multiple  0   Live Births  1            Home Medications    Prior to Admission medications   Medication Sig Start Date End Date Taking? Authorizing Provider  amLODipine (NORVASC) 10 MG tablet Take 10 mg by mouth daily. 02/23/17  Yes [provider]  hydrochlorothiazide (HYDRODIURIL) 12.5 MG tablet Take 2 tablets (25 mg total) by mouth daily. Patient taking differently: Take 12.5 mg by mouth daily. 05/14/18  Yes Bing Neighbors, NP  metFORMIN (GLUCOPHAGE) 500 MG tablet Take 1 tablet (500 mg total) by mouth 2 (two) times daily with a meal. Patient taking differently: Take  500 mg by mouth daily. 06/19/17  Yes Willodean Rosenthal, MD  methocarbamol (ROBAXIN) 500 MG tablet Take 1 tablet (500 mg total) by mouth 2 (two) times daily as needed for muscle spasms. 01/01/23  Yes Demaree Liberto A, NP  OZEMPIC, 2 MG/DOSE, 8 MG/3ML SOPN Inject 2 mg into the skin once a week. 07/16/22  Yes [provider]  tamoxifen (NOLVADEX) 20 MG tablet TAKE 1 TABLET BY MOUTH EVERY DAY 12/24/22  Yes Serena Croissant, MD  acetaminophen (TYLENOL) 325 MG tablet Take 650 mg by mouth daily as needed for headache.    [provider]  acetaminophen-codeine 120-12 MG/5ML solution TAKE BY MOUTH AT BEDTIME AS NEEDED    [provider]  albuterol (VENTOLIN HFA) 108 (90 Base) MCG/ACT inhaler Inhale 1-2 puffs into the lungs every 6 (six) hours as needed for wheezing or shortness of breath. 09/21/19   Wieters, Hallie C, PA-C  ALPRAZolam (XANAX) 0.25 MG tablet TAKE 1 TABLET BY MOUTH TWICE A DAY AS NEEDED FOR ANXIETY 08/25/18   Renaye Rakers, MD  celecoxib (CELEBREX) 200 MG capsule Take 1 capsule (200 mg total) by mouth 2 (two) times daily. 10/20/19   Arthor Captain, PA-C    Family History Family History  Problem Relation Age of Onset   Hypertension Mother    Diabetes Mother    COPD Mother    Macular degeneration Father    Glaucoma Father    Prostate cancer Father 46   Breast cancer Paternal Aunt    Prostate cancer Paternal Uncle    Breast cancer Paternal Grandmother        dx in her 64s   Glaucoma Paternal Grandfather    Prostate cancer Paternal Grandfather    Breast cancer Other        paternal grandmother's sister   Anesthesia problems Neg Hx    Amblyopia Neg Hx    Blindness Neg Hx    Cataracts Neg Hx    Retinal detachment Neg Hx    Strabismus Neg Hx    Retinitis pigmentosa Neg Hx     Social History Social History   Tobacco Use   Smoking status: Never   Smokeless tobacco: Never  Vaping Use   Vaping status: Never Used  Substance Use Topics   Alcohol use: Not Currently    Alcohol/week: 1.0 standard drink of alcohol    Types: 1 Cans of beer per week    Comment: occa   Drug use: No     Allergies   Shrimp extract, Shrimp [shellfish allergy], Aleve [naproxen], and Nickel   Review of Systems Review of Systems  Constitutional:  Negative for fever.  Gastrointestinal:  Negative for abdominal pain.  Genitourinary:  Negative for dysuria, flank pain, frequency, hematuria, pelvic pain and urgency.  Musculoskeletal:  Positive for back pain and myalgias. Negative for gait problem.  Neurological:  Negative for numbness.     Physical Exam Triage Vital Signs ED Triage Vitals  Encounter Vitals  Group     BP 01/01/23 1902 (!) 148/90     Systolic BP Percentile --      Diastolic BP Percentile --      Pulse Rate 01/01/23 1902 92     Resp 01/01/23 1902 18     Temp 01/01/23 1902 99 F (37.2 C)     Temp Source 01/01/23 1902 Oral     SpO2 01/01/23 1902 96 %     Weight --      Height --  Head Circumference --      Peak Flow --      Pain Score 01/01/23 1859 10     Pain Loc --      Pain Education --      Exclude from Growth Chart --    No data found.  Updated Vital Signs BP (!) 148/90 (BP Location: Left Arm)   Pulse 92   Temp 99 F (37.2 C) (Oral)   Resp 18   LMP 05/27/2017 (Approximate)   SpO2 96%   Visual Acuity Right Eye Distance:   Left Eye Distance:   Bilateral Distance:    Right Eye Near:   Left Eye Near:    Bilateral Near:     Physical Exam Vitals and nursing note reviewed.  Constitutional:      General: She is awake. She is not in acute distress.    Appearance: Normal appearance. She is well-developed and well-groomed. She is not ill-appearing, toxic-appearing or diaphoretic.  Musculoskeletal:     Cervical back: Normal, normal range of motion and neck supple.     Thoracic back: Normal.     Lumbar back: Spasms and tenderness present. No swelling, edema or bony tenderness. Normal range of motion.     Comments: Mild tenderness to bilateral low back.  Skin:    General: Skin is warm and dry.  Neurological:     Mental Status: She is alert.  Psychiatric:        Behavior: Behavior is cooperative.      UC Treatments / Results  Labs (all labs ordered are listed, but only abnormal results are displayed) Labs Reviewed  POCT URINALYSIS DIP (MANUAL ENTRY) - Abnormal; Notable for the following components:      Result Value   Clarity, UA cloudy (*)    Blood, UA small (*)    All other components within normal limits    EKG   Radiology No results found.  Procedures Procedures (including critical care time)  Medications Ordered in UC Medications  - No data to display  Initial Impression / Assessment and Plan / UC Course  I have reviewed the triage vital signs and the nursing notes.  Pertinent labs & imaging results that were available during my care of the patient were reviewed by me and considered in my medical decision making (see chart for details).     Patient presented with bilateral low back pain that began around 3 PM today.  Denies any known injury.  UA ordered per standing protocol, which did not reveal UTI.  Upon assessment patient has mild tenderness to bilateral low back.  Patient endorses intermittent sharp shooting pain.  Originally discussed getting patient IM Decadron to help with inflammation.  When nurse went to room to give injection patient stated that she is currently taking prednisone for upper respiratory infection.  After further discussion with patient we decided against steroid injection today.  Prescribed Robaxin as needed for back pain and spasms.  Discussed follow-up, return, strict ER precautions. Final Clinical Impressions(s) / UC Diagnoses   Final diagnoses:  Acute bilateral low back pain without sciatica     Discharge Instructions      You can take her back seen twice daily as needed for pain and spasms.  He is can make you drowsy so do not work, drive, or drink alcohol while taking this.  Otherwise she can alternate between ibuprofen and Tylenol as needed for pain.  You can also alternate between heat and ice for pain.  If you symptoms persist you can return here for reevaluation, follow-up with primary care, or go to [facility] for evaluation.  If you develop severe back pain, abdominal pain, fever, or blood in urine please seek immediate medical treatment in the ER.    ED Prescriptions     Medication Sig Dispense Auth. Provider   methocarbamol (ROBAXIN) 500 MG tablet Take 1 tablet (500 mg total) by mouth 2 (two) times daily as needed for muscle spasms. 20 tablet Wynonia Lawman A, NP      I  have reviewed the PDMP during this encounter.   Wynonia Lawman A, NP 01/01/23 2003

## 2023-01-01 NOTE — ED Triage Notes (Signed)
Pt states at 3pm today she started heaving low back pain that has not resolved. She took IBU

## 2023-04-09 ENCOUNTER — Other Ambulatory Visit: Payer: Self-pay

## 2023-04-09 ENCOUNTER — Encounter (HOSPITAL_COMMUNITY): Payer: Self-pay

## 2023-04-09 ENCOUNTER — Emergency Department (HOSPITAL_COMMUNITY)
Admission: EM | Admit: 2023-04-09 | Discharge: 2023-04-09 | Disposition: A | Discharge status: Home / Self Care | Payer: Medicaid Other | Attending: Emergency Medicine | Admitting: Emergency Medicine

## 2023-04-09 ENCOUNTER — Emergency Department (HOSPITAL_COMMUNITY): Payer: Medicaid Other

## 2023-04-09 DIAGNOSIS — E119 Type 2 diabetes mellitus without complications: Secondary | ICD-10-CM | POA: Insufficient documentation

## 2023-04-09 DIAGNOSIS — R102 Pelvic and perineal pain: Secondary | ICD-10-CM | POA: Diagnosis not present

## 2023-04-09 DIAGNOSIS — R1032 Left lower quadrant pain: Secondary | ICD-10-CM | POA: Diagnosis not present

## 2023-04-09 DIAGNOSIS — Z7984 Long term (current) use of oral hypoglycemic drugs: Secondary | ICD-10-CM | POA: Insufficient documentation

## 2023-04-09 DIAGNOSIS — R112 Nausea with vomiting, unspecified: Secondary | ICD-10-CM | POA: Diagnosis present

## 2023-04-09 DIAGNOSIS — Z79899 Other long term (current) drug therapy: Secondary | ICD-10-CM | POA: Insufficient documentation

## 2023-04-09 DIAGNOSIS — I10 Essential (primary) hypertension: Secondary | ICD-10-CM | POA: Insufficient documentation

## 2023-04-09 LAB — CBC
HCT: 37.3 % (ref 36.0–46.0)
Hemoglobin: 11.4 g/dL — ABNORMAL LOW (ref 12.0–15.0)
MCH: 24.3 pg — ABNORMAL LOW (ref 26.0–34.0)
MCHC: 30.6 g/dL (ref 30.0–36.0)
MCV: 79.4 fL — ABNORMAL LOW (ref 80.0–100.0)
Platelets: 314 10*3/uL (ref 150–400)
RBC: 4.7 MIL/uL (ref 3.87–5.11)
RDW: 15.9 % — ABNORMAL HIGH (ref 11.5–15.5)
WBC: 8.2 10*3/uL (ref 4.0–10.5)
nRBC: 0 % (ref 0.0–0.2)

## 2023-04-09 LAB — COMPREHENSIVE METABOLIC PANEL
ALT: 14 U/L (ref 0–44)
AST: 14 U/L — ABNORMAL LOW (ref 15–41)
Albumin: 3.5 g/dL (ref 3.5–5.0)
Alkaline Phosphatase: 34 U/L — ABNORMAL LOW (ref 38–126)
Anion gap: 8 (ref 5–15)
BUN: 14 mg/dL (ref 6–20)
CO2: 26 mmol/L (ref 22–32)
Calcium: 8.7 mg/dL — ABNORMAL LOW (ref 8.9–10.3)
Chloride: 105 mmol/L (ref 98–111)
Creatinine, Ser: 0.75 mg/dL (ref 0.44–1.00)
GFR, Estimated: >60 mL/min (ref >60)
Glucose, Bld: 112 mg/dL — ABNORMAL HIGH (ref 70–99)
Potassium: 3.9 mmol/L (ref 3.5–5.1)
Sodium: 139 mmol/L (ref 135–145)
Total Bilirubin: 0.7 mg/dL (ref 0.0–1.2)
Total Protein: 6.9 g/dL (ref 6.5–8.1)

## 2023-04-09 LAB — URINALYSIS, ROUTINE W REFLEX MICROSCOPIC
Bilirubin Urine: NEGATIVE
Glucose, UA: NEGATIVE mg/dL
Hgb urine dipstick: NEGATIVE
Ketones, ur: NEGATIVE mg/dL
Leukocytes,Ua: NEGATIVE
Nitrite: NEGATIVE
Protein, ur: 30 mg/dL — AB
Specific Gravity, Urine: 1.019 (ref 1.005–1.030)
pH: 5 (ref 5.0–8.0)

## 2023-04-09 LAB — LIPASE, BLOOD: Lipase: 33 U/L (ref 11–51)

## 2023-04-09 MED ORDER — ONDANSETRON HCL 4 MG/2ML IJ SOLN
4.0000 mg | Freq: Once | INTRAMUSCULAR | Status: AC
  Administered 2023-04-09: 4 mg via INTRAVENOUS
  Filled 2023-04-09: qty 2

## 2023-04-09 MED ORDER — IOHEXOL 350 MG/ML SOLN
75.0000 mL | Freq: Once | INTRAVENOUS | Status: AC | PRN
  Administered 2023-04-09: 75 mL via INTRAVENOUS

## 2023-04-09 MED ORDER — SODIUM CHLORIDE 0.9 % IV BOLUS
1000.0000 mL | Freq: Once | INTRAVENOUS | Status: AC
  Administered 2023-04-09: 1000 mL via INTRAVENOUS

## 2023-04-09 MED ORDER — ACETAMINOPHEN 325 MG PO TABS
650.0000 mg | ORAL_TABLET | Freq: Once | ORAL | Status: AC
  Administered 2023-04-09: 650 mg via ORAL
  Filled 2023-04-09: qty 2

## 2023-04-09 MED ORDER — MORPHINE SULFATE (PF) 4 MG/ML IV SOLN
4.0000 mg | Freq: Once | INTRAVENOUS | Status: AC
  Administered 2023-04-09: 4 mg via INTRAVENOUS
  Filled 2023-04-09: qty 1

## 2023-04-09 MED ORDER — ONDANSETRON HCL 4 MG PO TABS: 4.0000 mg | ORAL_TABLET | Freq: Four times a day (QID) | ORAL | 0 refills | Status: AC

## 2023-04-09 NOTE — ED Provider Notes (Signed)
EMERGENCY DEPARTMENT AT [facility] Provider Note   CSN: [id number] Arrival date & time: 04/09/23  5284     History  Chief Complaint  Patient presents with   Abdominal Pain   Emesis   Nausea   Diarrhea    Sheryl Cox is a 53 y.o. female.  Patient is a 53 year old female with a past medical history of hypertension, diabetes, prior hysterectomy and cholecystectomy presenting to the emergency department with abdominal pain, vomiting and diarrhea.  Patient states that she woke up this morning with nausea and vomiting and diarrhea.  She states that she is having associated lower abdominal pain.  She denies any fevers.  She denies any black or bloody stool.  She denies any dysuria or hematuria.  She denies any known sick contacts but states that she does work had a food court at a school and is unsure if she was exposed to anyone at work.  She denies any cough, congestion or sore throat.  The history is provided by the patient.  Abdominal Pain Associated symptoms: diarrhea and vomiting   Emesis Associated symptoms: abdominal pain and diarrhea   Diarrhea Associated symptoms: abdominal pain and vomiting        Home Medications Prior to Admission medications   Medication Sig Start Date End Date Taking? Authorizing Provider  acetaminophen (TYLENOL) 325 MG tablet Take 650 mg by mouth daily as needed for headache.   Yes [provider]  acetaminophen-codeine 120-12 MG/5ML solution TAKE BY MOUTH AT BEDTIME AS NEEDED   Yes [provider]  albuterol (VENTOLIN HFA) 108 (90 Base) MCG/ACT inhaler Inhale 1-2 puffs into the lungs every 6 (six) hours as needed for wheezing or shortness of breath. 09/21/19  Yes Wieters, Hallie C, PA-C  ALPRAZolam (XANAX) 0.25 MG tablet TAKE 1 TABLET BY MOUTH TWICE A DAY AS NEEDED FOR ANXIETY 08/25/18  Yes Renaye Rakers, MD  amLODipine (NORVASC) 10 MG tablet Take 10 mg by mouth daily. 02/23/17  Yes [provider]   celecoxib (CELEBREX) 200 MG capsule Take 1 capsule (200 mg total) by mouth 2 (two) times daily. 10/20/19  Yes Harris, Abigail, PA-C  cetirizine (ZYRTEC) 10 MG tablet Take 10 mg by mouth daily. 04/20/21  Yes [provider]  cyclobenzaprine (FLEXERIL) 10 MG tablet Take 10 mg by mouth every 8 (eight) hours as needed for muscle spasms. 01/04/23  Yes [provider]  hydrochlorothiazide (HYDRODIURIL) 25 MG tablet Take 25 mg by mouth every morning. 01/29/23  Yes [provider]  ibuprofen (ADVIL) 800 MG tablet Take 800 mg by mouth 2 (two) times daily as needed for fever, headache, mild pain (pain score 1-3), moderate pain (pain score 4-6) or cramping. 04/05/23  Yes [provider]  metFORMIN (GLUCOPHAGE) 500 MG tablet Take 1 tablet (500 mg total) by mouth 2 (two) times daily with a meal. Patient taking differently: Take 500 mg by mouth daily. 06/19/17  Yes Harraway-Smith, Eber Jones, MD  montelukast (SINGULAIR) 10 MG tablet Take 10 mg by mouth at bedtime. 01/16/23  Yes [provider]  OZEMPIC, 2 MG/DOSE, 8 MG/3ML SOPN Inject 2 mg into the skin once a week. 07/16/22  Yes [provider]  tamoxifen (NOLVADEX) 20 MG tablet TAKE 1 TABLET BY MOUTH EVERY DAY 12/24/22  Yes Serena Croissant, MD  Vitamin D, Ergocalciferol, (DRISDOL) 1.25 MG (50000 UNIT) CAPS capsule Take 50,000 Units by mouth once a week. 04/07/23  Yes [provider]  hydrochlorothiazide (HYDRODIURIL) 12.5 MG tablet  Take 2 tablets (25 mg total) by mouth daily. Patient not taking: Reported on 04/09/2023 05/14/18   Bing Neighbors, NP  methocarbamol (ROBAXIN) 500 MG tablet Take 1 tablet (500 mg total) by mouth 2 (two) times daily as needed for muscle spasms. Patient not taking: Reported on 04/09/2023 01/01/23   Wynonia Lawman A, NP  predniSONE (DELTASONE) 10 MG tablet Take 10 mg by mouth See admin instructions. Take as directed Patient not taking: Reported on 04/09/2023 02/18/23   [provider]      Allergies    Shrimp extract, Shrimp [shellfish allergy], Aleve [naproxen], and Nickel    Review of Systems   Review of Systems  Gastrointestinal:  Positive for abdominal pain, diarrhea and vomiting.    Physical Exam Updated Vital Signs BP (!) 142/73 (BP Location: Left Arm)   Pulse (!) 113   Temp 100 F (37.8 C) (Oral)   Resp 17   Ht 5\' 3"  (1.6 m)   Wt 117.9 kg   LMP 05/27/2017 (Approximate)   SpO2 99%   BMI 46.06 kg/m  Physical Exam Vitals and nursing note reviewed.  Constitutional:      General: She is not in acute distress.    Appearance: She is well-developed.  HENT:     Head: Normocephalic and atraumatic.     Mouth/Throat:     Mouth: Mucous membranes are moist.  Eyes:     Extraocular Movements: Extraocular movements intact.  Cardiovascular:     Rate and Rhythm: Regular rhythm. Tachycardia present.     Heart sounds: Normal heart sounds.  Pulmonary:     Effort: Pulmonary effort is normal.     Breath sounds: Normal breath sounds.  Abdominal:     General: Abdomen is flat.     Palpations: Abdomen is soft.     Tenderness: There is abdominal tenderness in the suprapubic area and left lower quadrant. There is no guarding or rebound.  Skin:    General: Skin is warm and dry.  Neurological:     General: No focal deficit present.     Mental Status: She is alert and oriented to person, place, and time.  Psychiatric:        Mood and Affect: Mood normal.        Behavior: Behavior normal.     ED Results / Procedures / Treatments   Labs (all labs ordered are listed, but only abnormal results are displayed) Labs Reviewed  COMPREHENSIVE METABOLIC PANEL - Abnormal; Notable for the following components:      Result Value   Glucose, Bld 112 (*)    Calcium 8.7 (*)    AST 14 (*)    Alkaline Phosphatase 34 (*)    All other components within normal limits  CBC - Abnormal; Notable for the following components:   Hemoglobin 11.4 (*)    MCV 79.4 (*)     MCH 24.3 (*)    RDW 15.9 (*)    All other components within normal limits  URINALYSIS, ROUTINE W REFLEX MICROSCOPIC - Abnormal; Notable for the following components:   APPearance HAZY (*)    Protein, ur 30 (*)    Bacteria, UA RARE (*)    All other components within normal limits  LIPASE, BLOOD    EKG None  Radiology No results found.  Procedures Procedures    Medications Ordered in ED Medications  acetaminophen (TYLENOL) tablet 650 mg (has no administration in time range)  morphine (PF) 4 MG/ML injection 4 mg (4  mg Intravenous Given 04/09/23 1244)  sodium chloride 0.9 % bolus 1,000 mL (1,000 mLs Intravenous New Bag/Given 04/09/23 1244)  ondansetron (ZOFRAN) injection 4 mg (4 mg Intravenous Given 04/09/23 1243)  iohexol (OMNIPAQUE) 350 MG/ML injection 75 mL (75 mLs Intravenous Contrast Given 04/09/23 1424)    ED Course/ Medical Decision Making/ A&P Clinical Course as of 04/09/23 1511  Tue Apr 09, 2023  1508 Stable abdominal pain pending CTAP. [CC]  1509 Patient signed out to Dr. Doran Durand pending CT read. Patient did have low grade temp and was given Tylenol. [VK]    Clinical Course User Index [CC] Glyn Ade, MD [VK] Rexford Maus, DO                                 Medical Decision Making This patient presents to the ED with chief complaint(s) of N/V/D, abdominal pain with pertinent past medical history of diabetes, hypertension, hysterectomy, cholecystectomy which further complicates the presenting complaint. The complaint involves an extensive differential diagnosis and also carries with it a high risk of complications and morbidity.    The differential diagnosis includes gastroenteritis, gastritis, GERD, dehydration, electrolyte abnormality, diverticulitis or other intra-abdominal infection, no urinary symptoms making UTI less likely  Additional history obtained: Additional history obtained from N/A Records reviewed N/A  ED Course and  Reassessment: On patient's arrival she is tachycardic but otherwise hemodynamically stable in no acute distress.  She was initially evaluated in triage and had labs performed that showed no acute abnormality.  She is point tender in her lower abdomen and will have CT to evaluate for diverticulitis or other intra-abdominal infection and she will be treated symptomatically with fluids, nausea and pain control and will be closely reassessed.  Independent labs interpretation:  The following labs were independently interpreted: at baseline/no acute abnormality   Independent visualization of imaging: - CT pending     Amount and/or Complexity of Data Reviewed Labs: ordered. Radiology: ordered.  Risk OTC drugs. Prescription drug management.          Final Clinical Impression(s) / ED Diagnoses Final diagnoses:  None    Rx / DC Orders ED Discharge Orders     None         Rexford Maus, DO 04/09/23 1511

## 2023-04-09 NOTE — ED Triage Notes (Signed)
Pt. Stated, I woke up this morning with N/V/D with some stomach pain

## 2023-04-09 NOTE — ED Notes (Signed)
Patient transported to CT 

## 2023-04-09 NOTE — ED Provider Notes (Signed)
Care of patient received from prior provider at 4:44 PM, please see their note for complete H/P and care plan.  Received handoff per ED course.  Clinical Course as of 04/09/23 1644  Tue Apr 09, 2023  1508 Stable abdominal pain pending CTAP. [CC]  1509 Patient signed out to Dr. Doran Durand pending CT read. Patient did have low grade temp and was given Tylenol. [VK]    Clinical Course User Index [CC] Glyn Ade, MD [VK] Rexford Maus, DO    Reassessment: Reassessed at bedside.  No acute distress abdominal pain resolved.  Likely nonspecific etiology/viral gastroenteritis. She is ambulatory tolerating p.o. intake feels comfortable outpatient care management discharged after 8 hours in the emergency room in no acute distress.  Strict return precautions reinforced.     Glyn Ade, MD 04/09/23 [phone]

## 2023-04-11 ENCOUNTER — Ambulatory Visit: Payer: No Typology Code available for payment source | Admitting: Internal Medicine

## 2023-05-20 ENCOUNTER — Inpatient Hospital Stay
Payer: No Typology Code available for payment source | Attending: Hematology and Oncology | Admitting: Hematology and Oncology

## 2023-05-20 VITALS — BP 157/71 | HR 100 | Temp 97.8°F | Resp 18 | Ht 63.0 in | Wt 264.0 lb

## 2023-05-20 DIAGNOSIS — Z79810 Long term (current) use of selective estrogen receptor modulators (SERMs): Secondary | ICD-10-CM | POA: Diagnosis not present

## 2023-05-20 DIAGNOSIS — I10 Essential (primary) hypertension: Secondary | ICD-10-CM | POA: Insufficient documentation

## 2023-05-20 DIAGNOSIS — D0511 Intraductal carcinoma in situ of right breast: Secondary | ICD-10-CM

## 2023-05-20 DIAGNOSIS — Z170 Estrogen receptor positive status [ER+]: Secondary | ICD-10-CM | POA: Diagnosis not present

## 2023-05-20 DIAGNOSIS — E669 Obesity, unspecified: Secondary | ICD-10-CM | POA: Insufficient documentation

## 2023-05-20 DIAGNOSIS — Z79899 Other long term (current) drug therapy: Secondary | ICD-10-CM | POA: Diagnosis not present

## 2023-05-20 DIAGNOSIS — Z1721 Progesterone receptor positive status: Secondary | ICD-10-CM | POA: Insufficient documentation

## 2023-05-20 NOTE — Progress Notes (Signed)
 Patient Care Team: Renaye Rakers, MD as PCP - General (Family Medicine) Parke Poisson, MD as PCP - Cardiology (Cardiology)  DIAGNOSIS:  Encounter Diagnosis  Name Primary?   Ductal carcinoma in situ (DCIS) of right breast Yes    SUMMARY OF ONCOLOGIC HISTORY: Oncology History  Ductal carcinoma in situ (DCIS) of right breast  11/06/2012 Mammogram    Numerous calcifications appear both linear in morphology and linear in distribution and extend over a large portion of the breast.  The longest area is 6.0 x 1.2 cm.  A second area is 6.0 x 1.4 cm.  Also closer to the nipple, two discrete calcifications   11/21/2012 Initial Diagnosis   Breast cancer of upper-outer quadrant of right female breast; ER 100% PR 100% DCIS with calcifications based on biopsy of the right breast.   11/26/2012 Breast MRI   upper inner quadrant of the posterior left breast, there is an 8 x 7 x 7 mm circumscribed enhancing mass;    03/25/2013 - 05/02/2013 Radiation Therapy   50 Gy to the right breast    05/19/2013 -  Anti-estrogen oral therapy   Tamoxifen 20 mg daily   02/23/2014 Surgery   Right Lumpectomy: At [facility]; DCIS, ER/PR Positive   06/26/2017 Surgery   Hysterectomy with BSO     CHIEF COMPLIANT: Surveillance of breast cancer  HISTORY OF PRESENT ILLNESS:   History of Present Illness The patient, with a history of breast cancer, presents for a routine follow-up. She has been on tamoxifen since March 2015 and reports no adverse effects such as hot flashes, joint stiffness, mood swings, or heavy bleeding. She expresses a desire to continue the medication despite the doctor's suggestion to consider discontinuation due to potential risks of blood clots and the lack of scientific need after ten years of therapy.     ALLERGIES:  is allergic to shrimp extract, shrimp [shellfish allergy], aleve [naproxen], and nickel.  MEDICATIONS:  Current Outpatient Medications  Medication Sig Dispense Refill    acetaminophen-codeine 120-12 MG/5ML solution TAKE BY MOUTH AT BEDTIME AS NEEDED     albuterol (VENTOLIN HFA) 108 (90 Base) MCG/ACT inhaler Inhale 1-2 puffs into the lungs every 6 (six) hours as needed for wheezing or shortness of breath. 18 g 0   amLODipine (NORVASC) 10 MG tablet Take 10 mg by mouth daily.  3   hydrochlorothiazide (HYDRODIURIL) 12.5 MG tablet Take 2 tablets (25 mg total) by mouth daily. 30 tablet 3   hydrochlorothiazide (HYDRODIURIL) 25 MG tablet Take 25 mg by mouth every morning.     metFORMIN (GLUCOPHAGE) 500 MG tablet Take 1 tablet (500 mg total) by mouth 2 (two) times daily with a meal. (Patient taking differently: Take 500 mg by mouth daily.) 60 tablet 2   montelukast (SINGULAIR) 10 MG tablet Take 10 mg by mouth at bedtime.     OZEMPIC, 2 MG/DOSE, 8 MG/3ML SOPN Inject 2 mg into the skin once a week.     predniSONE (DELTASONE) 10 MG tablet Take 10 mg by mouth See admin instructions. Take as directed     tamoxifen (NOLVADEX) 20 MG tablet TAKE 1 TABLET BY MOUTH EVERY DAY 90 tablet 3   Vitamin D, Ergocalciferol, (DRISDOL) 1.25 MG (50000 UNIT) CAPS capsule Take 50,000 Units by mouth once a week.     acetaminophen (TYLENOL) 325 MG tablet Take 650 mg by mouth daily as needed for headache. (Patient not taking: Reported on 05/20/2023)     ALPRAZolam (XANAX) 0.25  MG tablet TAKE 1 TABLET BY MOUTH TWICE A DAY AS NEEDED FOR ANXIETY (Patient not taking: Reported on 05/20/2023)     celecoxib (CELEBREX) 200 MG capsule Take 1 capsule (200 mg total) by mouth 2 (two) times daily. (Patient not taking: Reported on 05/20/2023) 20 capsule 0   cetirizine (ZYRTEC) 10 MG tablet Take 10 mg by mouth daily. (Patient not taking: Reported on 05/20/2023)     cyclobenzaprine (FLEXERIL) 10 MG tablet Take 10 mg by mouth every 8 (eight) hours as needed for muscle spasms. (Patient not taking: Reported on 05/20/2023)     ibuprofen (ADVIL) 800 MG tablet Take 800 mg by mouth 2 (two) times daily as needed for fever,  headache, mild pain (pain score 1-3), moderate pain (pain score 4-6) or cramping. (Patient not taking: Reported on 05/20/2023)     methocarbamol (ROBAXIN) 500 MG tablet Take 1 tablet (500 mg total) by mouth 2 (two) times daily as needed for muscle spasms. (Patient not taking: Reported on 04/09/2023) 20 tablet 0   ondansetron (ZOFRAN) 4 MG tablet Take 1 tablet (4 mg total) by mouth every 6 (six) hours. (Patient not taking: Reported on 05/20/2023) 12 tablet 0   No current facility-administered medications for this visit.    PHYSICAL EXAMINATION: ECOG PERFORMANCE STATUS: 1 - Symptomatic but completely ambulatory  Vitals:   05/20/23 1230  BP: (!) 157/71  Pulse: 100  Resp: 18  Temp: 97.8 F (36.6 C)  SpO2: 100%   Filed Weights   05/20/23 1230  Weight: 264 lb (119.7 kg)    Physical Exam   (exam performed in the presence of a chaperone)  LABORATORY DATA:  I have reviewed the data as listed    Latest Ref Rng & Units 04/09/2023    9:32 AM 12/21/2022    7:46 AM 09/16/2022    1:52 PM  CMP  Glucose 70 - 99 mg/dL 161  096  92   BUN 6 - 20 mg/dL 14  13  14    Creatinine 0.44 - 1.00 mg/dL 0.45  4.09  8.11   Sodium 135 - 145 mmol/L 139  138  138   Potassium 3.5 - 5.1 mmol/L 3.9  3.5  3.8   Chloride 98 - 111 mmol/L 105  99  100   CO2 22 - 32 mmol/L 26  31  29    Calcium 8.9 - 10.3 mg/dL 8.7  9.7  9.6   Total Protein 6.5 - 8.1 g/dL 6.9   7.9   Total Bilirubin 0.0 - 1.2 mg/dL 0.7   0.3   Alkaline Phos 38 - 126 U/L 34   32   AST 15 - 41 U/L 14   12   ALT 0 - 44 U/L 14   11     Lab Results  Component Value Date   WBC 8.2 04/09/2023   HGB 11.4 (L) 04/09/2023   HCT 37.3 04/09/2023   MCV 79.4 (L) 04/09/2023   PLT 314 04/09/2023   NEUTROABS 5.4 09/16/2022    ASSESSMENT & PLAN:  Ductal carcinoma in situ (DCIS) of right breast Right breast DCIS ER/PR positive status post lumpectomy at [facility] currently on tamoxifen 10 mg daily March 2015   Tamoxifen Toxicities: Denies side  effects of tamoxifen therapy. Patient wishes to stay on antiestrogen therapy indefinitely even though data for DCIS is only up to 5. Patient understands risks and benefits.   Hypertension: On blood pressure medication. Follows with her primary care physician. 2019 total hysterectomy with bilateral  salpingo-oophorectomy.   Surveillance:  Mammogram at [facility] 04/30/2023 benign Breast exam 05/20/2023: Benign   History of mild microcytic anemia: She follows with her primary care physician. Obesity:  Once again we discussed about obesity and risk factor for breast cancer.  Encouraged her to stay more active and watch her diet.  She recently started Ozempic and is awaiting to see the results.   Return to clinic in 1 year for follow-up      No orders of the defined types were placed in this encounter.  The patient has a good understanding of the overall plan. she agrees with it. she will call with any problems that may develop before the next visit here. Total time spent: 30 mins including face to face time and time spent for planning, charting and co-ordination of care   Tamsen Meek, MD 05/20/23

## 2023-05-20 NOTE — Assessment & Plan Note (Signed)
 Right breast DCIS ER/PR positive status post lumpectomy at [facility] currently on tamoxifen 10 mg daily March 2015   Tamoxifen Toxicities: Denies side effects of tamoxifen therapy. Patient wishes to stay on antiestrogen therapy with tamoxifen for 10 years even though data for DCIS is only up to 5. Patient understands risks and benefits.   Hypertension: On blood pressure medication. Follows with her primary care physician. 2019 total hysterectomy with bilateral salpingo-oophorectomy.   Surveillance:  Mammogram at [name] 04/30/2023 benign Breast exam 05/20/2023: Benign   History of mild microcytic anemia: She follows with her primary care physician. Obesity:  Once again we discussed about obesity and risk factor for breast cancer.  Encouraged her to stay more active and watch her diet.  She recently started Ozempic and is awaiting to see the results.   Return to clinic in 1 year for follow-up

## 2023-07-07 ENCOUNTER — Emergency Department (HOSPITAL_BASED_OUTPATIENT_CLINIC_OR_DEPARTMENT_OTHER)
Admission: EM | Admit: 2023-07-07 | Discharge: 2023-07-07 | Disposition: A | Discharge status: Home / Self Care | Attending: Emergency Medicine | Admitting: Emergency Medicine

## 2023-07-07 ENCOUNTER — Encounter (HOSPITAL_BASED_OUTPATIENT_CLINIC_OR_DEPARTMENT_OTHER): Payer: Self-pay | Admitting: Emergency Medicine

## 2023-07-07 ENCOUNTER — Emergency Department (HOSPITAL_BASED_OUTPATIENT_CLINIC_OR_DEPARTMENT_OTHER)

## 2023-07-07 DIAGNOSIS — Z79899 Other long term (current) drug therapy: Secondary | ICD-10-CM | POA: Insufficient documentation

## 2023-07-07 DIAGNOSIS — Z7984 Long term (current) use of oral hypoglycemic drugs: Secondary | ICD-10-CM | POA: Insufficient documentation

## 2023-07-07 DIAGNOSIS — I159 Secondary hypertension, unspecified: Secondary | ICD-10-CM | POA: Diagnosis not present

## 2023-07-07 DIAGNOSIS — E119 Type 2 diabetes mellitus without complications: Secondary | ICD-10-CM | POA: Diagnosis not present

## 2023-07-07 DIAGNOSIS — Z853 Personal history of malignant neoplasm of breast: Secondary | ICD-10-CM | POA: Insufficient documentation

## 2023-07-07 DIAGNOSIS — R002 Palpitations: Secondary | ICD-10-CM

## 2023-07-07 DIAGNOSIS — I10 Essential (primary) hypertension: Secondary | ICD-10-CM | POA: Diagnosis not present

## 2023-07-07 LAB — CBC
HCT: 35.4 % — ABNORMAL LOW (ref 36.0–46.0)
Hemoglobin: 11.3 g/dL — ABNORMAL LOW (ref 12.0–15.0)
MCH: 24.9 pg — ABNORMAL LOW (ref 26.0–34.0)
MCHC: 31.9 g/dL (ref 30.0–36.0)
MCV: 78.1 fL — ABNORMAL LOW (ref 80.0–100.0)
Platelets: 347 10*3/uL (ref 150–400)
RBC: 4.53 MIL/uL (ref 3.87–5.11)
RDW: 15.3 % (ref 11.5–15.5)
WBC: 7 10*3/uL (ref 4.0–10.5)
nRBC: 0 % (ref 0.0–0.2)

## 2023-07-07 LAB — BASIC METABOLIC PANEL WITH GFR
Anion gap: 12 (ref 5–15)
BUN: 20 mg/dL (ref 6–20)
CO2: 29 mmol/L (ref 22–32)
Calcium: 9.8 mg/dL (ref 8.9–10.3)
Chloride: 98 mmol/L (ref 98–111)
Creatinine, Ser: 1.14 mg/dL — ABNORMAL HIGH (ref 0.44–1.00)
GFR, Estimated: 58 mL/min — ABNORMAL LOW (ref >60)
Glucose, Bld: 131 mg/dL — ABNORMAL HIGH (ref 70–99)
Potassium: 3.4 mmol/L — ABNORMAL LOW (ref 3.5–5.1)
Sodium: 139 mmol/L (ref 135–145)

## 2023-07-07 LAB — TROPONIN I (HIGH SENSITIVITY): Troponin I (High Sensitivity): 2 ng/L (ref <18)

## 2023-07-07 MED ORDER — HYDRALAZINE HCL 20 MG/ML IJ SOLN: 5.0000 mg | Freq: Once | INTRAMUSCULAR | Status: DC

## 2023-07-07 NOTE — ED Provider Notes (Signed)
 [place] EMERGENCY DEPARTMENT AT [facility] Provider Note   CSN: [id number] Arrival date & time: 07/07/23  1127     History  Chief Complaint  Patient presents with   Palpitations    Sheryl Cox is a 53 y.o. female.  Patient is a 53 year old female with past medical history of hypertension, anemia, diabetes, and a previous history of breast cancer presenting for complaints of palpitations.  Patient states she normally takes amlodipine  and hydrochlorothiazide  daily.  She states she has noticed some lower extremity swelling worse than her baseline over the past few days.  She states this morning at [facility] she had ham and bacon.  She states that she started to feel lightheaded and dizzy after that and started to have palpitations.  She denies any chest pain or difficulty breathing.  Heart rate on arrival was 113 with a normal sinus rhythm.  Blood pressure on arrival 154/97.  The patient also admits to pain behind her left knee and left upper thigh.  No prior history of a DVT or PE.  No blood thinner use.  The history is provided by the patient. No language interpreter was used.  Palpitations Associated symptoms: dizziness   Associated symptoms: no back pain, no chest pain, no cough, no shortness of breath and no vomiting        Home Medications Prior to Admission medications   Medication Sig Start Date End Date Taking? Authorizing Provider  acetaminophen  (TYLENOL ) 325 MG tablet Take 650 mg by mouth daily as needed for headache. Patient not taking: Reported on 05/20/2023    [provider]  acetaminophen -codeine 120-12 MG/5ML solution TAKE BY MOUTH AT BEDTIME AS NEEDED    [provider]  albuterol  (VENTOLIN  HFA) 108 (90 Base) MCG/ACT inhaler Inhale 1-2 puffs into the lungs every 6 (six) hours as needed for wheezing or shortness of breath. 09/21/19   Wieters, Hallie C, PA-C  ALPRAZolam (XANAX) 0.25 MG tablet TAKE 1 TABLET BY MOUTH TWICE A DAY AS  NEEDED FOR ANXIETY Patient not taking: Reported on 05/20/2023 08/25/18   Jonathon Neighbors, MD  amLODipine  (NORVASC ) 10 MG tablet Take 10 mg by mouth daily. 02/23/17   [provider]  celecoxib  (CELEBREX ) 200 MG capsule Take 1 capsule (200 mg total) by mouth 2 (two) times daily. Patient not taking: Reported on 05/20/2023 10/20/19   Harris, Abigail, PA-C  cetirizine  (ZYRTEC ) 10 MG tablet Take 10 mg by mouth daily. Patient not taking: Reported on 05/20/2023 04/20/21   [provider]  cyclobenzaprine  (FLEXERIL ) 10 MG tablet Take 10 mg by mouth every 8 (eight) hours as needed for muscle spasms. Patient not taking: Reported on 05/20/2023 01/04/23   [provider]  hydrochlorothiazide  (HYDRODIURIL ) 12.5 MG tablet Take 2 tablets (25 mg total) by mouth daily. 05/14/18   Buena Carmine, NP  hydrochlorothiazide  (HYDRODIURIL ) 25 MG tablet Take 25 mg by mouth every morning. 01/29/23   [provider]  ibuprofen  (ADVIL ) 800 MG tablet Take 800 mg by mouth 2 (two) times daily as needed for fever, headache, mild pain (pain score 1-3), moderate pain (pain score 4-6) or cramping. Patient not taking: Reported on 05/20/2023 04/05/23   [provider]  metFORMIN  (GLUCOPHAGE ) 500 MG tablet Take 1 tablet (500 mg total) by mouth 2 (two) times daily with a meal. Patient taking differently: Take 500 mg by mouth daily. 06/19/17   Lenord Radon, MD  methocarbamol  (ROBAXIN ) 500 MG tablet Take 1 tablet (500 mg total) by mouth  2 (two) times daily as needed for muscle spasms. Patient not taking: Reported on 04/09/2023 01/01/23   Levora Reas A, NP  montelukast (SINGULAIR) 10 MG tablet Take 10 mg by mouth at bedtime. 01/16/23   [provider]  ondansetron  (ZOFRAN ) 4 MG tablet Take 1 tablet (4 mg total) by mouth every 6 (six) hours. Patient not taking: Reported on 05/20/2023 04/09/23   Countryman, Chase, MD  OZEMPIC, 2 MG/DOSE, 8 MG/3ML SOPN Inject 2 mg into the skin once a week.  07/16/22   [provider]  predniSONE  (DELTASONE ) 10 MG tablet Take 10 mg by mouth See admin instructions. Take as directed 02/18/23   [provider]  tamoxifen  (NOLVADEX ) 20 MG tablet TAKE 1 TABLET BY MOUTH EVERY DAY 12/24/22   Gudena, Vinay, MD  Vitamin D, Ergocalciferol, (DRISDOL) 1.25 MG (50000 UNIT) CAPS capsule Take 50,000 Units by mouth once a week. 04/07/23   [provider]      Allergies    Shrimp extract, Shrimp [shellfish allergy], Aleve  [naproxen ], and Nickel    Review of Systems   Review of Systems  Constitutional:  Negative for chills and fever.  HENT:  Negative for ear pain and sore throat.   Eyes:  Negative for pain and visual disturbance.  Respiratory:  Negative for cough and shortness of breath.   Cardiovascular:  Positive for palpitations. Negative for chest pain.  Gastrointestinal:  Negative for abdominal pain and vomiting.  Genitourinary:  Negative for dysuria and hematuria.  Musculoskeletal:  Negative for arthralgias and back pain.  Skin:  Negative for color change and rash.  Neurological:  Positive for dizziness and light-headedness. Negative for seizures and syncope.  All other systems reviewed and are negative.   Physical Exam Updated Vital Signs BP 137/83 (BP Location: Right Arm)   Pulse (!) 104   Temp 98.8 F (37.1 C) (Oral)   Resp 18   LMP 05/27/2017 (Approximate)   SpO2 100%  Physical Exam Vitals and nursing note reviewed.  Constitutional:      General: She is not in acute distress.    Appearance: She is well-developed.  HENT:     Head: Normocephalic and atraumatic.  Eyes:     Conjunctiva/sclera: Conjunctivae normal.  Cardiovascular:     Rate and Rhythm: Normal rate and regular rhythm.     Heart sounds: No murmur heard. Pulmonary:     Effort: Pulmonary effort is normal. No respiratory distress.     Breath sounds: Normal breath sounds.  Abdominal:     Palpations: Abdomen is soft.     Tenderness: There is no  abdominal tenderness.  Musculoskeletal:        General: No swelling.     Cervical back: Neck supple.  Skin:    General: Skin is warm and dry.     Capillary Refill: Capillary refill takes less than 2 seconds.  Neurological:     Mental Status: She is alert.  Psychiatric:        Mood and Affect: Mood normal.     ED Results / Procedures / Treatments   Labs (all labs ordered are listed, but only abnormal results are displayed) Labs Reviewed  BASIC METABOLIC PANEL WITH GFR - Abnormal; Notable for the following components:      Result Value   Potassium 3.4 (*)    Glucose, Bld 131 (*)    Creatinine, Ser 1.14 (*)    GFR, Estimated 58 (*)    All other components within normal limits  CBC -  Abnormal; Notable for the following components:   Hemoglobin 11.3 (*)    HCT 35.4 (*)    MCV 78.1 (*)    MCH 24.9 (*)    All other components within normal limits  TROPONIN I (HIGH SENSITIVITY)  TROPONIN I (HIGH SENSITIVITY)    EKG None  Radiology US  Venous Img Lower Unilateral Left Result Date: 07/07/2023 CLINICAL DATA:  Left thigh pain. EXAM: LEFT LOWER EXTREMITY VENOUS DOPPLER ULTRASOUND TECHNIQUE: Mayme Profeta-scale sonography with compression, as well as color and duplex ultrasound, were performed to evaluate the deep venous system(s) from the level of the common femoral vein through the popliteal and proximal calf veins. COMPARISON:  None Available. FINDINGS: VENOUS Normal compressibility of the common femoral, superficial femoral, and popliteal veins, as well as the visualized calf veins. Visualized portions of profunda femoral vein and great saphenous vein unremarkable. No filling defects to suggest DVT on grayscale or color Doppler imaging. Doppler waveforms show normal direction of venous flow, normal respiratory plasticity and response to augmentation. Limited views of the contralateral common femoral vein are unremarkable. OTHER None. Limitations: none IMPRESSION: Negative. Electronically Signed    By: Fernando Hoyer M.D.   On: 07/07/2023 14:31   DG Chest Port 1 View Result Date: 07/07/2023 CLINICAL DATA:  [id number] Tachycardia [id number] EXAM: PORTABLE CHEST - 1 VIEW COMPARISON:  12/21/2022. FINDINGS: Cardiac silhouette is unremarkable. No pneumothorax or pleural effusion. The lungs are clear. The visualized skeletal structures are unremarkable. IMPRESSION: No acute cardiopulmonary process. Electronically Signed   By: Sydell Eva M.D.   On: 07/07/2023 12:31    Procedures Procedures    Medications Ordered in ED Medications  hydrALAZINE  (APRESOLINE ) injection 5 mg (0 mg Intravenous Hold 07/07/23 1255)    ED Course/ Medical Decision Making/ A&P                                 Medical Decision Making Amount and/or Complexity of Data Reviewed Labs: ordered. Radiology: ordered.  Risk Prescription drug management.   54 year old female with past medical history of hypertension, anemia, diabetes, and a previous history of breast cancer presenting for complaints of palpitations.   Stable twelve-lead EKG demonstrating sinus tachycardia.  No ST segment ovation or depression.  No signs of Wolff-Parkinson-White.  Stable intervals.  No hypoglycemia or anemia.  Troponins within normal limits.  Electrolytes stable.  Concerns that patient's symptoms are secondary to hypertension secondary to high salt intake this morning.  Antihypertensives given in ED.  Patient also complains of unilateral calf tenderness with concerns for DVT.  No shortness of breath or chest pain to suggest pulmonary embolism.  Lower extremity ultrasound demonstrates no DVT.  Reevaluation of patient demonstrates improvement after blood pressure is controlled.  Recommend to decrease salt and continue taking her daily antihypertensives.  Patient in no distress and overall condition improved here in the ED. Detailed discussions were had with the patient regarding current findings, and need for close f/u with PCP or on call  doctor. The patient has been instructed to return immediately if the symptoms worsen in any way for re-evaluation. Patient verbalized understanding and is in agreement with current care plan. All questions answered prior to discharge.        Final Clinical Impression(s) / ED Diagnoses Final diagnoses:  Secondary hypertension  Palpitations    Rx / DC Orders ED Discharge Orders     None         Martina Sledge,  Terin Dierolf P, DO 07/07/23 1455

## 2023-07-07 NOTE — ED Triage Notes (Signed)
 Starting around 1000 patient started feeling dizzy and "heart racing". Hx of breast cancer. Sent from UC.

## 2023-07-07 NOTE — Discharge Instructions (Signed)
 Decrease salt.  Continue to take antihypertensive medications as prescribed.  Follow-up with primary care in the next 1 to 2 weeks for blood pressure recheck.

## 2023-07-11 ENCOUNTER — Ambulatory Visit: Payer: Medicaid Other | Attending: Internal Medicine | Admitting: Internal Medicine

## 2023-07-11 ENCOUNTER — Encounter: Payer: Self-pay | Admitting: Internal Medicine

## 2023-07-11 VITALS — BP 124/82 | HR 93 | Ht 63.0 in | Wt 261.8 lb

## 2023-07-11 DIAGNOSIS — E782 Mixed hyperlipidemia: Secondary | ICD-10-CM

## 2023-07-11 DIAGNOSIS — R42 Dizziness and giddiness: Secondary | ICD-10-CM

## 2023-07-11 DIAGNOSIS — R079 Chest pain, unspecified: Secondary | ICD-10-CM

## 2023-07-11 DIAGNOSIS — I10 Essential (primary) hypertension: Secondary | ICD-10-CM

## 2023-07-11 DIAGNOSIS — D0511 Intraductal carcinoma in situ of right breast: Secondary | ICD-10-CM

## 2023-07-11 DIAGNOSIS — R600 Localized edema: Secondary | ICD-10-CM

## 2023-07-11 DIAGNOSIS — R002 Palpitations: Secondary | ICD-10-CM

## 2023-07-11 LAB — BASIC METABOLIC PANEL WITH GFR
BUN/Creatinine Ratio: 23 (ref 9–23)
BUN: 21 mg/dL (ref 6–24)
CO2: 27 mmol/L (ref 20–29)
Calcium: 10 mg/dL (ref 8.7–10.2)
Chloride: 95 mmol/L — ABNORMAL LOW (ref 96–106)
Creatinine, Ser: 0.9 mg/dL (ref 0.57–1.00)
Glucose: 105 mg/dL — ABNORMAL HIGH (ref 70–99)
Potassium: 4 mmol/L (ref 3.5–5.2)
Sodium: 140 mmol/L (ref 134–144)
eGFR: 76 mL/min/{1.73_m2} (ref >59)

## 2023-07-11 NOTE — Patient Instructions (Signed)
 Medication Instructions:  No Changes *If you need a refill on your cardiac medications before your next appointment, please call your pharmacy*  Lab Work: BMET Today  If you have labs (blood work) drawn today and your tests are completely normal, you will receive your results only by: MyChart Message (if you have MyChart) OR A paper copy in the mail If you have any lab test that is abnormal or we need to change your treatment, we will call you to review the results.  Testing/Procedures: Your physician has requested that you have an echocardiogram, next available appointment. Echocardiography is a painless test that uses sound waves to create images of your heart. It provides your doctor with information about the size and shape of your heart and how well your heart's chambers and valves are working. This procedure takes approximately one hour. There are no restrictions for this procedure. Please do NOT wear cologne, perfume, aftershave, or lotions (deodorant is allowed). Please arrive 15 minutes prior to your appointment time.   Follow-Up: At [facility], you and your health needs are our priority.  As part of our continuing mission to provide you with exceptional heart care, our providers are all part of one team.  This team includes your primary Cardiologist (physician) and Advanced Practice Providers or APPs (Physician Assistants and Nurse Practitioners) who all work together to provide you with the care you need, when you need it.  Your next appointment:   3 month(s)  Provider:   Marcie Sever, PA-C, Sharren Decree, PA-C, Hao Meng, PA-C, or Marlana Silvan, NP             1st Floor: - Lobby - Registration  - Pharmacy  - Lab - Cafe  2nd Floor: - PV Lab - Diagnostic Testing (echo, CT, nuclear med)  3rd Floor: - Vacant  4th Floor: - TCTS (cardiothoracic surgery) - AFib Clinic - Structural Heart Clinic - Vascular Surgery  - Vascular Ultrasound  5th Floor: -  HeartCare Cardiology (general and EP) - Clinical Pharmacy for coumadin, hypertension, lipid, weight-loss medications, and med management appointments    Valet parking services will be available as well.

## 2023-07-11 NOTE — Progress Notes (Signed)
 Cardiology Office Note:  .   Date:  07/11/2023  ID:  Sheryl Cox, DOB October 12, 1970, MRN [id number] PCP: Jonathon Neighbors, MD  [facility] Providers Cardiologist:  Euell Herrlich, MD    History of Present Illness: .   Sheryl Cox is a 53 y.o. female.  Discussed the use of AI scribe software for clinical note transcription with the patient, who gave verbal consent to proceed.  History of Present Illness The patient, with a history of hypertension, diabetes, and breast cancer, presented for a follow-up after a recent ER visit due to palpitations and dizziness. The patient reported feeling lightheaded and noted leg swelling, which started after a recent high-salt meal. The patient's blood pressure was elevated during the ER visit but has since been controlled with medication, likely the source of presenting symptoms. The patient has been adherent to the prescribed medication regimen, including amlodipine  and hydrochlorothiazide  for hypertension, and Ozempic for diabetes. The patient's diabetes is well-controlled, with a recent A1c of 6.1-6.3. The patient also reported a history of chest pressure, which was previously evaluated with an echocardiogram, stress test, and heart monitor in 2021, all of which were reassuring. The patient denied any chest pain but described a sensation of heart fluttering.    ROS: negative except per HPI above.  Studies Reviewed: Aaron Aas   EKG Interpretation Date/Time:  Thursday July 11 2023 08:03:22 EDT Ventricular Rate:  95 PR Interval:  160 QRS Duration:  80 QT Interval:  354 QTC Calculation: 444 R Axis:   63  Text Interpretation: Normal sinus rhythm Septal infarct , age undetermined Confirmed by Grady Lawman ([id number]) on 07/11/2023 8:27:06 AM    Results LABS A1c: 6.1, 6.3 LDL: 115 HDL: 32 Triglycerides: 126 Potassium: 3.4 (07/07/2023) Creatinine: 1.14 (07/07/2023)  DIAGNOSTIC Echocardiogram: Normal Stress test: Normal Heart monitor:  Normal Risk Assessment/Calculations:          Physical Exam:   VS:  BP 124/82   Pulse 93   Ht 5\' 3"  (1.6 m)   Wt 261 lb 12.8 oz (118.8 kg)   LMP 05/27/2017 (Approximate)   SpO2 95%   BMI 46.38 kg/m    Wt Readings from Last 3 Encounters:  07/11/23 261 lb 12.8 oz (118.8 kg)  05/20/23 264 lb (119.7 kg)  04/09/23 260 lb (117.9 kg)     Physical Exam GENERAL: Alert, cooperative, well developed, no acute distress HEENT: Normocephalic, normal oropharynx, moist mucous membranes CHEST: Clear to auscultation bilaterally, no wheezes, rhonchi, or crackles CARDIOVASCULAR: Normal heart rate and rhythm, S1 and S2 normal without murmurs ABDOMEN: Soft, non-tender, non-distended, without organomegaly, normal bowel sounds EXTREMITIES: No cyanosis or edema NEUROLOGICAL: Cranial nerves grossly intact, moves all extremities without gross motor or sensory deficit   ASSESSMENT AND PLAN: .    Assessment and Plan Assessment & Plan Hypertension Hypertension with recent elevated blood pressure episodes, likely due to salt sensitivity. Currently controlled with medication. - Continue amlodipine  10 mg daily. - Continue hydrochlorothiazide  12.5 mg daily. - Advise to reduce salt intake.  Palpitations Abnormal EKG Recent palpitations possibly linked to blood pressure fluctuations. Previous cardiac tests normal. - Order echocardiogram to reassess cardiac function.  Dizziness Dizziness likely related to elevated blood pressure and salt intake. Symptoms improving with blood pressure control.  Edema Leg swelling likely due to salt intake and blood pressure elevation. Symptoms expected to improve with diuretics and salt reduction. - Recheck labs to assess kidney function and potassium levels. - Consider prescribing furosemide if labs are normal and  swelling persists. - Advise to continue hydrochlorothiazide  and reduce salt intake.  Hyperlipidemia Mild hyperlipidemia with LDL at 115 mg/dL and HDL at  32 mg/dL. Triglycerides controlled. - Advise dietary changes to improve cholesterol levels.  Diabetes mellitus, type 2 Type 2 diabetes well-controlled with A1c of 6.1-6.3%. - Continue semaglutide (Ozempic).  Breast cancer Breast cancer with ongoing tamoxifen  therapy. No current issues. - Continue tamoxifen  therapy.      Grady Lawman, MD, [facility]

## 2023-07-16 ENCOUNTER — Other Ambulatory Visit: Payer: Self-pay | Admitting: *Deleted

## 2023-07-16 MED ORDER — FUROSEMIDE 20 MG PO TABS: 20.0000 mg | ORAL_TABLET | Freq: Every day | ORAL | 2 refills | Status: AC | PRN

## 2023-08-15 ENCOUNTER — Ambulatory Visit (HOSPITAL_COMMUNITY): Admission: RE | Admit: 2023-08-15 | Source: Ambulatory Visit | Attending: Internal Medicine

## 2023-08-15 ENCOUNTER — Encounter (HOSPITAL_COMMUNITY): Payer: Self-pay | Admitting: Internal Medicine

## 2023-09-25 NOTE — Progress Notes (Signed)
 Subjective  Patient ID: Sheryl Cox is a 53 y.o. female.  Chief Complaint  Patient presents with  . Rash    Pt reports she woke up from a nap and noticed some swelling around her mouth. Pt states there is a bump on the inside of her mouth. Pt denies any swelling to tongue or throat and states she is not having any trouble breathing. No known contact with any allergens.    The following information was reviewed by members of the visit team:  Tobacco  Allergies  Meds  Med Hx  Surg Hx  Fam Hx      Rash 53 year old female presents with soreness in gum and puffy. Patient reports waking from a nap and noticed the inside of her mouth was sore and outer lip was puffy. Patient thinks maybe a knat bite her. She denies fever. Denies SHOB or trouble breathing. Denies any gum or oral mucous injury.   Review of Systems  Skin:  Positive for rash.  All other systems reviewed and are negative.   Objective  Physical Exam Vitals and nursing note reviewed.  Constitutional:      General: She is not in acute distress.    Appearance: Normal appearance. She is not ill-appearing, toxic-appearing or diaphoretic.  HENT:     Head: Normocephalic and atraumatic.     Nose: Nose normal.     Mouth/Throat:     Mouth: Mucous membranes are moist. Oral lesions present.     Pharynx: No oropharyngeal exudate or posterior oropharyngeal erythema.     Comments: Left buccal with irration. No ulcer lesion  Eyes:     Conjunctiva/sclera: Conjunctivae normal.    Cardiovascular:     Rate and Rhythm: Normal rate and regular rhythm.     Heart sounds: No murmur heard. Pulmonary:     Effort: Pulmonary effort is normal. No respiratory distress.     Breath sounds: Normal breath sounds. No stridor. No wheezing, rhonchi or rales.   Skin:    General: Skin is warm and dry.   Neurological:     Mental Status: She is alert.   Psychiatric:        Mood and Affect: Mood normal.    Assessment/Plan   Diagnoses and all orders for this visit:  Aphthous stomatitis  Other orders -     triamcinolone  acetonide (KENALOG ) 0.1 % paste; Apply to teeth 2 (two) times a day.  53 year old female presents with gum irration and soreness consistent with aphthous stomatitis. I did consider dental trauma, HSV I, dental abscess.   Patient has been instructed on medications, dosages, side effects, and possible interactions as associated with each diagnosis in my impression and plan above.  2.  Patient education (verbal/handout) given on diagnosis, pathophysiology, treatment of diagnosis, side effects of medication used for treatment, restrictions while       taking medication,  supportive measures such as using OTC medications, to finish all antibiotics, home care, disposable toothbrush, and push fluids.  3.  Patient was instructed on when to follow up and know that they can follow up here, with their PCP, Urgent Care, ED.  They have been instructed that if symptoms worse that should return to the clinic , go to the nearest ED, or activate EMS.  4.  Patient was given AVS and this was reviewed with them.  Red Flags associated with their diagnoses were reviewed and patient was educated on what to do if red flags develop.  5.  Patient agreed with plan and voiced understanding.  No barriers to adherence perceived by myself.    Electronically signed: Lonell Rattler Mabe, FNP 09/25/2023  5:49 PM

## 2023-10-07 NOTE — Progress Notes (Unsigned)
 Cardiology Office Note:    Date:  10/10/2023   ID:  LATISIA HILAIRE, DOB [birth year], MRN [id number]  PCP:  Benjamine Aland, MD   [facility] Providers Cardiologist:  Soyla DELENA Merck, MD Cardiology APP:  Madie Jon Garre, PA     Referring MD: Benjamine Aland, MD   Chief Complaint  Patient presents with   Follow-up    ER follow up    History of Present Illness:    Sheryl Cox is a 53 y.o. female with a hx of HTN, DM, breast cancer (tamoxifen ), anemia, and migraines.  Echo 2021 with preserved LVEF 60-65%, grade 1 DD, and no significant valvular disease. Nuclear stress test 2021 was nonischemic. Heart monitor showed predominantly SR, no arrhythmias.   She was seen in the ER 06/2023 with elevated BP and palpitations after heating a high sodium meal. At follow up, she was doing well on current HTN regimen, palpitations felt related to HTN. Echo was recommended, not yet completed.   She presents for scheduled cardiology follow up. No cardiac complaints today. She is maintained on 10 mg amlodipine , 25 mg hydrochlorothiazide . She takes PRN lasix  - generally taking once per month.   She is currently being treated for bronchitis. No awareness of tachycardia,  no palpitations.     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+, PR+.Takes Tamoxifen  daily   Diabetes mellitus without complication (HCC)    takes Metformin  daily   History of migraine    several yrs ago   Hypertension    takes Tribenzor daily    Past Surgical History:  Procedure Laterality Date   BIOPSY BREAST Left 12/05/12   fibroadenoma   BREAST LUMPECTOMY     2014   BREAST SURGERY Right    CESAREAN SECTION  01/05/2009   CHOLECYSTECTOMY N/A 01/31/2016   Procedure: LAPAROSCOPIC CHOLECYSTECTOMY;  Surgeon: Debby Shipper, MD;  Location: [facility] OR;  Service: General;   Laterality: N/A;   DILITATION & CURRETTAGE/HYSTROSCOPY WITH HYDROTHERMAL ABLATION N/A 08/12/2013   Procedure: DILATATION & CURETTAGE/HYSTEROSCOPY WITH HYDROTHERMAL ABLATION;  Surgeon: Aida DELENA Na, MD;  Location: [facility]S;  Service: Gynecology;  Laterality: N/A;   ROBOTIC ASSISTED TOTAL HYSTERECTOMY N/A 06/18/2017   Procedure: XI ROBOTIC ASSISTED TOTAL HYSTERECTOMY;  Surgeon: Corene Coy, MD;  Location: [facility];  Service: Gynecology;  Laterality: N/A;   SALPINGOOPHORECTOMY Bilateral 06/18/2017   Procedure: BILATERAL SALPINGO OOPHORECTOMY;  Surgeon: Corene Coy, MD;  Location: [facility];  Service: Gynecology;  Laterality: Bilateral;    Current Medications: Current Meds  Medication Sig   acetaminophen  (TYLENOL ) 325 MG tablet Take 650 mg by mouth daily as needed for headache.   acetaminophen -codeine 120-12 MG/5ML solution TAKE BY MOUTH AT BEDTIME AS NEEDED   albuterol  (VENTOLIN  HFA) 108 (90 Base) MCG/ACT inhaler Inhale 1-2 puffs into the lungs every 6 (six) hours as needed for wheezing or shortness of breath.   ALPRAZolam (XANAX) 0.25 MG tablet    amLODipine  (NORVASC ) 10 MG tablet Take 10 mg by mouth daily.   celecoxib  (CELEBREX ) 200 MG capsule Take 1 capsule (200 mg total) by mouth 2 (two) times daily.   cetirizine  (ZYRTEC ) 10 MG tablet Take 10 mg by mouth daily.   cyclobenzaprine  (FLEXERIL ) 10 MG tablet Take 10 mg by mouth every 8 (eight) hours as needed for muscle spasms.  furosemide  (LASIX ) 20 MG tablet Take 1 tablet (20 mg total) by mouth daily as needed. Stop medication once symptoms (swelling) improve; only take if needed.   ibuprofen  (ADVIL ) 800 MG tablet Take 800 mg by mouth 2 (two) times daily as needed for fever, headache, mild pain (pain score 1-3), moderate pain (pain score 4-6) or cramping.   metFORMIN  (GLUCOPHAGE ) 500 MG tablet Take 1 tablet (500 mg total) by mouth 2 (two) times daily with a meal. (Patient taking differently: Take 500 mg by mouth daily.)    methocarbamol  (ROBAXIN ) 500 MG tablet Take 1 tablet (500 mg total) by mouth 2 (two) times daily as needed for muscle spasms.   montelukast (SINGULAIR) 10 MG tablet Take 10 mg by mouth at bedtime.   ondansetron  (ZOFRAN ) 4 MG tablet Take 1 tablet (4 mg total) by mouth every 6 (six) hours.   OZEMPIC, 2 MG/DOSE, 8 MG/3ML SOPN Inject 2 mg into the skin once a week.   predniSONE  (DELTASONE ) 10 MG tablet Take 10 mg by mouth See admin instructions. Take as directed   tamoxifen  (NOLVADEX ) 20 MG tablet TAKE 1 TABLET BY MOUTH EVERY DAY   Vitamin D, Ergocalciferol, (DRISDOL) 1.25 MG (50000 UNIT) CAPS capsule Take 50,000 Units by mouth once a week.   [DISCONTINUED] hydrochlorothiazide  (HYDRODIURIL ) 12.5 MG tablet Take 2 tablets (25 mg total) by mouth daily.   [DISCONTINUED] hydrochlorothiazide  (HYDRODIURIL ) 25 MG tablet Take 25 mg by mouth every morning.     Allergies:   Shrimp extract, Shrimp [shellfish allergy], Aleve  [naproxen ], and Nickel   Social History   Socioeconomic History   Marital status: Single    Spouse name: Not on file   Number of children: 1   Years of education: Not on file   Highest education level: Not on file  Occupational History   Occupation: [employer]    Employer: [employer]  Tobacco Use   Smoking status: Never   Smokeless tobacco: Never  Vaping Use   Vaping status: Never Used  Substance and Sexual Activity   Alcohol use: Not Currently    Alcohol/week: 1.0 standard drink of alcohol    Types: 1 Cans of beer per week    Comment: occa   Drug use: No   Sexual activity: Not Currently    Birth control/protection: None    Comment: last intercourse > 1 year.  Other Topics Concern   Not on file  Social History Narrative   Not on file   Social Drivers of Health   Financial Resource Strain: Not on file  Food Insecurity: Low Risk  (05/14/2023)   Received from [facility]   Hunger Vital Sign    Within the past 12 months, you worried that  your food would run out before you got money to buy more: Never true    Within the past 12 months, the food you bought just didn't last and you didn't have money to get more. : Never true  Transportation Needs: No Transportation Needs (05/14/2023)   Received from [facility]    In the past 12 months, has lack of reliable transportation kept you from medical appointments, meetings, work or from getting things needed for daily living? : No  Physical Activity: Not on file  Stress: Not on file  Social Connections: Not on file     Family History: The patient's family history includes Breast cancer in her paternal aunt, paternal grandmother, and another family member; COPD in her mother; Diabetes  in her mother; Glaucoma in her father and paternal grandfather; Hypertension in her mother; Macular degeneration in her father; Prostate cancer in her paternal grandfather and paternal uncle; Prostate cancer (age of onset: 81) in her father. There is no history of Anesthesia problems, Amblyopia, Blindness, Cataracts, Retinal detachment, Strabismus, or Retinitis pigmentosa.  ROS:   Please see the history of present illness.     All other systems reviewed and are negative.  EKGs/Labs/Other Studies Reviewed:    The following studies were reviewed today:  EKG Interpretation Date/Time:  Thursday October 10 2023 08:39:55 EDT Ventricular Rate:  107 PR Interval:  160 QRS Duration:  82 QT Interval:  340 QTC Calculation: 453 R Axis:   2  Text Interpretation: Sinus tachycardia with Fusion complexes Possible Left atrial enlargement Anteroseptal infarct (cited on or before 11-Jul-2023) When compared with ECG of 11-Jul-2023 08:03, Fusion complexes are now Present Questionable change in initial forces of Anterior leads Nonspecific T wave abnormality, worse in Anterior leads Confirmed by Madie Slough ([id number]) on 10/10/2023 8:58:07 AM    Recent Labs: 04/09/2023: ALT 14 07/07/2023: Hemoglobin 11.3;  Platelets 347 07/11/2023: BUN 21; Creatinine, Ser 0.90; Potassium 4.0; Sodium 140  Recent Lipid Panel    Component Value Date/Time   CHOL 161 05/08/2018 1216   TRIG 207 (H) 05/08/2018 1216   HDL 30 (L) 05/08/2018 1216   CHOLHDL 5.4 (H) 05/08/2018 1216   LDLCALC 90 05/08/2018 1216     Risk Assessment/Calculations:               Physical Exam:    VS:  BP 126/84 (BP Location: Left Arm, Patient Position: Sitting)   Pulse (!) 105   Ht 5' 3 (1.6 m)   Wt 268 lb 3.2 oz (121.7 kg)   LMP 05/27/2017 (Approximate)   SpO2 94%   BMI 47.51 kg/m     Wt Readings from Last 3 Encounters:  10/10/23 268 lb 3.2 oz (121.7 kg)  07/11/23 261 lb 12.8 oz (118.8 kg)  05/20/23 264 lb (119.7 kg)     GEN:  Well nourished, well developed in no acute distress HEENT: Normal NECK: No JVD; No carotid bruits LYMPHATICS: No lymphadenopathy CARDIAC: irregular rhythm, tachycardic rate, no murmur RESPIRATORY:  Clear to auscultation without rales, wheezing or rhonchi  ABDOMEN: Soft, non-tender, non-distended MUSCULOSKELETAL:  No edema; No deformity  SKIN: Warm and dry NEUROLOGIC:  Alert and oriented x 3 PSYCHIATRIC:  Normal affect   ASSESSMENT:    1. Palpitations   2. Tachycardia   3. Primary hypertension   4. BMI 40.0-44.9, adult (HCC)    PLAN:    In order of problems listed above:  Irregular rhythm on exam - EKG today with ST with fusion complexes - she is being treated for bronchitis - baseline HR in the 90s - no SOB, just cough - she is asymptomatic   Hypertension - continue 10 mg amlodipine , 25 mg hydrochlorothiazide   - BP well controlled   Palpitations - usually with elevated BP, not on BB - no recent issues with palpitations   Obesity - on GLP1 - losing weight  Will bring back in 1 months to check HR.            Medication Adjustments/Labs and Tests Ordered: Current medicines are reviewed at length with the patient today.  Concerns regarding medicines are  outlined above.  Orders Placed This Encounter  Procedures   EKG 12-Lead   EKG 12-Lead   Meds ordered this encounter  Medications  hydrochlorothiazide  (HYDRODIURIL ) 25 MG tablet    Sig: Take 1 tablet (25 mg total) by mouth every morning.    Dispense:  90 tablet    Refill:  3    Patient Instructions  Medication Instructions:  Your physician recommends that you continue on your current medications as directed. Please refer to the Current Medication list given to you today.  *If you need a refill on your cardiac medications before your next appointment, please call your pharmacy*  Lab Work: NONE If you have labs (blood work) drawn today and your tests are completely normal, you will receive your results only by: MyChart Message (if you have MyChart) OR A paper copy in the mail If you have any lab test that is abnormal or we need to change your treatment, we will call you to review the results.  Testing/Procedures: NONE  Follow-Up: At [facility], you and your health needs are our priority.  As part of our continuing mission to provide you with exceptional heart care, our providers are all part of one team.  This team includes your primary Cardiologist (physician) and Advanced Practice Providers or APPs (Physician Assistants and Nurse Practitioners) who all work together to provide you with the care you need, when you need it.  Your next appointment:   6 month(s)  Provider:   Soyla DELENA Merck, MD    Your physician recommends that you schedule a follow-up appointment in: 1 MONTH WITH Jon Hails, PA-C       We recommend signing up for the patient portal called MyChart.  Sign up information is provided on this After Visit Summary.  MyChart is used to connect with patients for Virtual Visits (Telemedicine).  Patients are able to view lab/test results, encounter notes, upcoming appointments, etc.  Non-urgent messages can be sent to your provider as well.   To learn more  about what you can do with MyChart, go to [email or web address].         Signed, Jon Garre Asaad [place], [place]  10/10/2023 9:02 AM    [facility]

## 2023-10-10 ENCOUNTER — Encounter: Payer: Self-pay | Admitting: Physician Assistant

## 2023-10-10 ENCOUNTER — Ambulatory Visit: Attending: Physician Assistant | Admitting: Physician Assistant

## 2023-10-10 VITALS — BP 126/84 | HR 105 | Ht 63.0 in | Wt 268.2 lb

## 2023-10-10 DIAGNOSIS — R000 Tachycardia, unspecified: Secondary | ICD-10-CM | POA: Insufficient documentation

## 2023-10-10 DIAGNOSIS — Z6841 Body Mass Index (BMI) 40.0 and over, adult: Secondary | ICD-10-CM | POA: Insufficient documentation

## 2023-10-10 DIAGNOSIS — I10 Essential (primary) hypertension: Secondary | ICD-10-CM | POA: Diagnosis present

## 2023-10-10 DIAGNOSIS — R002 Palpitations: Secondary | ICD-10-CM | POA: Insufficient documentation

## 2023-10-10 MED ORDER — HYDROCHLOROTHIAZIDE 25 MG PO TABS: 25.0000 mg | ORAL_TABLET | Freq: Every morning | ORAL | 3 refills | Status: AC

## 2023-10-10 NOTE — Patient Instructions (Addendum)
 Medication Instructions:  Your physician recommends that you continue on your current medications as directed. Please refer to the Current Medication list given to you today.  *If you need a refill on your cardiac medications before your next appointment, please call your pharmacy*  Lab Work: NONE If you have labs (blood work) drawn today and your tests are completely normal, you will receive your results only by: MyChart Message (if you have MyChart) OR A paper copy in the mail If you have any lab test that is abnormal or we need to change your treatment, we will call you to review the results.  Testing/Procedures: NONE  Follow-Up: At [facility], you and your health needs are our priority.  As part of our continuing mission to provide you with exceptional heart care, our providers are all part of one team.  This team includes your primary Cardiologist (physician) and Advanced Practice Providers or APPs (Physician Assistants and Nurse Practitioners) who all work together to provide you with the care you need, when you need it.  Your next appointment:   6 month(s)  Provider:   Soyla DELENA Merck, MD    Your physician recommends that you schedule a follow-up appointment in: 1 MONTH WITH Jon Hails, PA-C       We recommend signing up for the patient portal called MyChart.  Sign up information is provided on this After Visit Summary.  MyChart is used to connect with patients for Virtual Visits (Telemedicine).  Patients are able to view lab/test results, encounter notes, upcoming appointments, etc.  Non-urgent messages can be sent to your provider as well.   To learn more about what you can do with MyChart, go to [email or web address].

## 2023-10-11 ENCOUNTER — Other Ambulatory Visit: Payer: Self-pay | Admitting: Internal Medicine

## 2023-11-14 ENCOUNTER — Ambulatory Visit: Attending: Cardiology | Admitting: Emergency Medicine

## 2023-11-14 NOTE — Progress Notes (Deleted)
 Cardiology Office Note:    Date:  11/14/2023  ID:  Sheryl Cox, DOB November 28, 1970, MRN [id number] PCP: Benjamine Aland, MD  [facility] Providers Cardiologist:  Soyla DELENA Merck, MD Cardiology APP:  Madie Jon Garre, PA { Click to update primary MD,subspecialty MD or APP then REFRESH:1}    {Click to Open Review  :1}   Patient Profile:       Chief Complaint: 1 month follow-up History of Present Illness:  Sheryl Cox is a 53 y.o. female with visit-pertinent history of hypertension, T2DM, breast cancer (tamoxifen ), anemia, and migraines  Echocardiogram in 2021 with preserved LVEF of 60 to 65%, grade 1 DD, and no significant valvular disease.  Nuclear stress test 2021 was nonischemic.  Heart monitor showed predominantly sinus rhythm with no arrhythmias.  She was seen in the emergency room on 06/2023 with elevated blood pressure and palpitations after eating a high sodium meal.  At follow-up she was doing well on current hypertension regimen, palpitations felt related to hypertension.  Follow-up echocardiogram was ordered.  She was last seen in office on 10/10/2023.  She is without any cardiac complaints.  She was being treated for bronchitis.  Her EKG showed sinus tachycardia with fusion complexes.  She was to come back in 1 month to reassess heart rate.   Discussed the use of AI scribe software for clinical note transcription with the patient, who gave verbal consent to proceed.  History of Present Illness     Review of systems:  Please see the history of present illness. All other systems are reviewed and otherwise negative. ***      Studies Reviewed:        Zio 04/24/2019 Sinus rhythm, rare PACs (<1%), rare PVCs (<1%). No diary events.  No atrial fibrillation, no VT, no SVT, no pauses.  Lexiscan  05/11/2019 The left ventricular ejection fraction is hyperdynamic (>65%). Nuclear stress EF: 69%. There was no ST segment deviation noted during stress. The study is  normal. This is a low risk study.  Echocardiogram 05/11/2019 1. Left ventricular ejection fraction, by estimation, is 60 to 65%. Left  ventricular ejection fraction by PLAX is 67 %. The left ventricle has  normal function. The left ventricle has no regional wall motion  abnormalities. There is moderate left  ventricular hypertrophy. Left ventricular diastolic parameters are  consistent with Grade I diastolic dysfunction (impaired relaxation). The  average left ventricular global longitudinal strain is -19.5 %.   2. Right ventricular systolic function is normal. The right ventricular  size is normal. There is normal pulmonary artery systolic pressure.   3. The mitral valve is normal in structure and function. Trivial mitral  valve regurgitation. No evidence of mitral stenosis.   4. The aortic valve is tricuspid. Aortic valve regurgitation is trivial.  No aortic stenosis is present.   5. The inferior vena cava is normal in size with <50% respiratory  variability, suggesting right atrial pressure of 8 mmHg.  Risk Assessment/Calculations:   {Does this patient have ATRIAL FIBRILLATION?:[id number]} No BP recorded.  {Refresh Note OR Click here to enter BP  :1}***        Physical Exam:   VS:  LMP 05/27/2017 (Approximate)    Wt Readings from Last 3 Encounters:  10/10/23 268 lb 3.2 oz (121.7 kg)  07/11/23 261 lb 12.8 oz (118.8 kg)  05/20/23 264 lb (119.7 kg)    GEN: Well nourished, well developed in no acute distress NECK: No JVD; No carotid bruits CARDIAC: ***RRR,  no murmurs, rubs, gallops RESPIRATORY:  Clear to auscultation without rales, wheezing or rhonchi  ABDOMEN: Soft, non-tender, non-distended EXTREMITIES:  No edema; No acute deformity ***      Assessment and Plan:  Hypertension  Palpitations ZIO 04/2019 with rare PACs and PVCs.  No A-fib, VT, SVT or pauses Have occurred in the past with elevated blood pressures, currently not on beta-blocking therapy  Lower extremity  edema  Hyperlipidemia  Type 2 diabetes Assessment & Plan      {Are you ordering a CV Procedure (e.g. stress test, cath, DCCV, TEE, etc)?   Press F2        :[id number]}  Dispo:  No follow-ups on file.  Signed, Lum LITTIE Louis, NP

## 2024-05-19 ENCOUNTER — Inpatient Hospital Stay: Admitting: Hematology and Oncology
